# Patient Record
Sex: Male | Born: 1945 | Race: White | Hispanic: No | Marital: Married | Smoking: Current some day smoker
Health system: Southern US, Community
[De-identification: ages and names within clinical notes are randomized; demographics above are authoritative.]

## PROBLEM LIST (undated history)

## (undated) DIAGNOSIS — G2581 Restless legs syndrome: Secondary | ICD-10-CM

## (undated) DIAGNOSIS — I10 Essential (primary) hypertension: Secondary | ICD-10-CM

## (undated) DIAGNOSIS — H3530 Unspecified macular degeneration: Secondary | ICD-10-CM

## (undated) DIAGNOSIS — G959 Disease of spinal cord, unspecified: Secondary | ICD-10-CM

## (undated) DIAGNOSIS — J40 Bronchitis, not specified as acute or chronic: Secondary | ICD-10-CM

## (undated) DIAGNOSIS — K219 Gastro-esophageal reflux disease without esophagitis: Secondary | ICD-10-CM

## (undated) DIAGNOSIS — J449 Chronic obstructive pulmonary disease, unspecified: Secondary | ICD-10-CM

## (undated) DIAGNOSIS — E039 Hypothyroidism, unspecified: Secondary | ICD-10-CM

## (undated) DIAGNOSIS — M1990 Unspecified osteoarthritis, unspecified site: Secondary | ICD-10-CM

## (undated) DIAGNOSIS — Z87442 Personal history of urinary calculi: Secondary | ICD-10-CM

## (undated) HISTORY — PX: OTHER SURGICAL HISTORY: SHX169

## (undated) HISTORY — DX: Disease of spinal cord, unspecified: G95.9

## (undated) HISTORY — PX: NECK SURGERY: SHX720

## (undated) HISTORY — PX: BACK SURGERY: SHX140

---

## 1968-04-15 HISTORY — PX: JOINT REPLACEMENT: SHX530

## 1998-01-04 ENCOUNTER — Ambulatory Visit (HOSPITAL_COMMUNITY): Admission: RE | Admit: 1998-01-04 | Discharge: 1998-01-04 | Payer: Self-pay | Admitting: Family Medicine

## 1998-05-17 ENCOUNTER — Inpatient Hospital Stay (HOSPITAL_COMMUNITY): Admission: RE | Admit: 1998-05-17 | Discharge: 1998-05-18 | Payer: Self-pay | Admitting: Neurosurgery

## 1998-05-17 ENCOUNTER — Encounter: Payer: Self-pay | Admitting: Neurosurgery

## 1998-06-09 ENCOUNTER — Ambulatory Visit (HOSPITAL_COMMUNITY): Admission: RE | Admit: 1998-06-09 | Discharge: 1998-06-09 | Payer: Self-pay | Admitting: Neurosurgery

## 1998-06-09 ENCOUNTER — Encounter: Payer: Self-pay | Admitting: Neurosurgery

## 1998-07-03 ENCOUNTER — Encounter: Payer: Self-pay | Admitting: Neurosurgery

## 1998-07-03 ENCOUNTER — Ambulatory Visit (HOSPITAL_COMMUNITY): Admission: RE | Admit: 1998-07-03 | Discharge: 1998-07-03 | Payer: Self-pay | Admitting: Neurosurgery

## 2000-08-08 ENCOUNTER — Ambulatory Visit (HOSPITAL_COMMUNITY): Admission: RE | Admit: 2000-08-08 | Discharge: 2000-08-08 | Payer: Self-pay | Admitting: Family Medicine

## 2000-08-08 ENCOUNTER — Encounter: Payer: Self-pay | Admitting: Family Medicine

## 2002-07-29 ENCOUNTER — Encounter: Admission: RE | Admit: 2002-07-29 | Discharge: 2002-07-29 | Payer: Self-pay | Admitting: Neurosurgery

## 2002-07-29 ENCOUNTER — Encounter: Payer: Self-pay | Admitting: Neurosurgery

## 2003-10-10 ENCOUNTER — Encounter: Admission: RE | Admit: 2003-10-10 | Discharge: 2003-10-10 | Payer: Self-pay | Admitting: Neurology

## 2003-11-22 ENCOUNTER — Inpatient Hospital Stay (HOSPITAL_COMMUNITY): Admission: RE | Admit: 2003-11-22 | Discharge: 2003-11-24 | Payer: Self-pay | Admitting: Neurosurgery

## 2004-06-28 ENCOUNTER — Inpatient Hospital Stay (HOSPITAL_COMMUNITY): Admission: AD | Admit: 2004-06-28 | Discharge: 2004-07-01 | Payer: Self-pay | Admitting: Internal Medicine

## 2004-11-21 ENCOUNTER — Ambulatory Visit: Payer: Self-pay | Admitting: Orthopedic Surgery

## 2011-10-30 ENCOUNTER — Other Ambulatory Visit: Payer: Self-pay | Admitting: Neurosurgery

## 2011-10-30 DIAGNOSIS — M542 Cervicalgia: Secondary | ICD-10-CM

## 2011-11-01 ENCOUNTER — Ambulatory Visit
Admission: RE | Admit: 2011-11-01 | Discharge: 2011-11-01 | Disposition: A | Discharge status: Home / Self Care | Payer: Medicare Other | Source: Ambulatory Visit | Attending: Neurosurgery | Admitting: Neurosurgery

## 2011-11-01 VITALS — BP 131/55 | HR 68

## 2011-11-01 DIAGNOSIS — M542 Cervicalgia: Secondary | ICD-10-CM

## 2011-11-01 MED ORDER — DIAZEPAM 5 MG PO TABS
10.0000 mg | ORAL_TABLET | Freq: Once | ORAL | Status: AC
  Administered 2011-11-01: 10 mg via ORAL

## 2011-11-01 MED ORDER — IOHEXOL 300 MG/ML  SOLN
10.0000 mL | Freq: Once | INTRAMUSCULAR | Status: AC | PRN
  Administered 2011-11-01: 10 mL via INTRAVENOUS

## 2011-11-25 ENCOUNTER — Other Ambulatory Visit: Payer: Self-pay | Admitting: Neurosurgery

## 2011-11-26 ENCOUNTER — Encounter (HOSPITAL_COMMUNITY): Payer: Self-pay | Admitting: Pharmacy Technician

## 2011-12-03 ENCOUNTER — Encounter (HOSPITAL_COMMUNITY)
Admission: RE | Admit: 2011-12-03 | Discharge: 2011-12-03 | Disposition: A | Discharge status: Home / Self Care | Payer: Medicare Other | Source: Ambulatory Visit | Attending: Neurosurgery | Admitting: Neurosurgery

## 2011-12-03 ENCOUNTER — Encounter (HOSPITAL_COMMUNITY): Payer: Self-pay

## 2011-12-03 HISTORY — DX: Unspecified macular degeneration: H35.30

## 2011-12-03 HISTORY — DX: Gastro-esophageal reflux disease without esophagitis: K21.9

## 2011-12-03 HISTORY — DX: Essential (primary) hypertension: I10

## 2011-12-03 HISTORY — DX: Hypothyroidism, unspecified: E03.9

## 2011-12-03 HISTORY — DX: Unspecified osteoarthritis, unspecified site: M19.90

## 2011-12-03 HISTORY — DX: Bronchitis, not specified as acute or chronic: J40

## 2011-12-03 HISTORY — DX: Restless legs syndrome: G25.81

## 2011-12-03 LAB — BASIC METABOLIC PANEL
CO2: 28 mEq/L (ref 19–32)
Calcium: 9.3 mg/dL (ref 8.4–10.5)
GFR calc Af Amer: >90 mL/min (ref >90)
Sodium: 140 mEq/L (ref 135–145)

## 2011-12-03 LAB — CBC
MCV: 92.2 fL (ref 78.0–100.0)
Platelets: 178 10*3/uL (ref 150–400)
RBC: 4.75 MIL/uL (ref 4.22–5.81)
WBC: 10.9 10*3/uL — ABNORMAL HIGH (ref 4.0–10.5)

## 2011-12-03 LAB — SURGICAL PCR SCREEN: Staphylococcus aureus: NEGATIVE

## 2011-12-03 NOTE — Pre-Procedure Instructions (Signed)
20 JOURDAIN GUAY  12/03/2011   Your procedure is scheduled on:  Thursday December 05, 2011.  Report to [facility] at 0900 AM.  Call this number if you have problems the morning of surgery: [phone]   Remember:   Do not eat food or drink:After Midnight.    Take these medicines the morning of surgery with A SIP OF WATER: Diazepam (Valium), Hydrocodone (Vicodin), and Levothyroxine (Synthroid).   Do not wear jewelry  Do not wear lotions or colognes.  Men may shave face and neck.  Do not bring valuables to the hospital.  Contacts, dentures or bridgework may not be worn into surgery.  Leave suitcase in the car. After surgery it may be brought to your room.  For patients admitted to the hospital, checkout time is 11:00 AM the day of discharge.   Patients discharged the day of surgery will not be allowed to drive home.  Name and phone number of your driver:   Special Instructions: CHG Shower Use Special Wash: 1/2 bottle night before surgery and 1/2 bottle morning of surgery.   Please read over the following fact sheets that you were given: Pain Booklet, Coughing and Deep Breathing, MRSA Information and Surgical Site Infection Prevention

## 2011-12-03 NOTE — Progress Notes (Signed)
CXR from 12/03/11 to Anesthesia for review.

## 2011-12-03 NOTE — Progress Notes (Signed)
Patient informed Nurse that he had a stress test in 2000. Patient denied having a cardiac cath or sleep study.

## 2011-12-04 MED ORDER — DEXTROSE 5 % IV SOLN: 3.0000 g | INTRAVENOUS | Status: DC

## 2011-12-04 MED ORDER — CEFAZOLIN SODIUM 1 G IJ SOLR
3.0000 g | INTRAMUSCULAR | Status: DC
  Filled 2011-12-04: qty 30

## 2011-12-04 MED ORDER — CEFAZOLIN SODIUM 1-5 GM-% IV SOLN: 1.0000 g | Freq: Once | INTRAVENOUS | Status: DC

## 2011-12-04 MED ORDER — DEXAMETHASONE SODIUM PHOSPHATE 10 MG/ML IJ SOLN
10.0000 mg | INTRAMUSCULAR | Status: AC
  Administered 2011-12-05: 10 mg via INTRAVENOUS
  Filled 2011-12-04: qty 1

## 2011-12-04 MED ORDER — DEXTROSE 5 % IV SOLN
3.0000 g | INTRAVENOUS | Status: AC
  Administered 2011-12-05: 3 g via INTRAVENOUS

## 2011-12-04 MED ORDER — DEXAMETHASONE SODIUM PHOSPHATE 10 MG/ML IJ SOLN: 10.0000 mg | INTRAMUSCULAR | Status: DC

## 2011-12-04 NOTE — Consult Note (Signed)
Anesthesia chart review; This is a a 66 year old patient who is scheduled for cervical spine surgery to be performed by Dr. Reinaldo Meeker on 05 December 2011.  CXR dated 17 November 2011- IMPRESSION: Emphysematous and minimal bronchitic changes question COPD. This patient has a history of tobacco use. There is no evidence of an infectious process or other acute abnormality noted.  Lab results dated 03 December 2011- CBC shows WBC very slightly elevated at 10.9 (4.0-10.5) the remainder of the values are within normal limits. CMP shows serum glucose level elevated at 168 (70-99). There does not appear to be a history of diabetes, however, this was a non-fasting study. The remainder of the values are within normal limits.   An EKG was not performed during the patient's pre-anesthesia visit and is scheduled to be performed DOS.   May proceed with surgery as scheduled pending no significant abnormality on the EKG.  Kelton Pillar. Ahtziry Saathoff, PA-C

## 2011-12-05 ENCOUNTER — Encounter (HOSPITAL_COMMUNITY)
Admission: RE | Disposition: A | Discharge status: Home / Self Care | Payer: Self-pay | Source: Ambulatory Visit | Attending: Neurosurgery

## 2011-12-05 ENCOUNTER — Inpatient Hospital Stay (HOSPITAL_COMMUNITY): Payer: Medicare Other | Admitting: Anesthesiology

## 2011-12-05 ENCOUNTER — Encounter (HOSPITAL_COMMUNITY): Payer: Self-pay | Admitting: *Deleted

## 2011-12-05 ENCOUNTER — Inpatient Hospital Stay (HOSPITAL_COMMUNITY)
Admission: RE | Admit: 2011-12-05 | Discharge: 2011-12-07 | DRG: 473 | Disposition: A | Discharge status: Home / Self Care | Payer: Medicare Other | Source: Ambulatory Visit | Attending: Neurosurgery | Admitting: Neurosurgery

## 2011-12-05 ENCOUNTER — Encounter (HOSPITAL_COMMUNITY): Payer: Self-pay | Admitting: Anesthesiology

## 2011-12-05 ENCOUNTER — Inpatient Hospital Stay (HOSPITAL_COMMUNITY): Payer: Medicare Other

## 2011-12-05 DIAGNOSIS — K219 Gastro-esophageal reflux disease without esophagitis: Secondary | ICD-10-CM | POA: Diagnosis present

## 2011-12-05 DIAGNOSIS — G2581 Restless legs syndrome: Secondary | ICD-10-CM | POA: Diagnosis present

## 2011-12-05 DIAGNOSIS — Z01812 Encounter for preprocedural laboratory examination: Secondary | ICD-10-CM

## 2011-12-05 DIAGNOSIS — Z981 Arthrodesis status: Secondary | ICD-10-CM

## 2011-12-05 DIAGNOSIS — J4489 Other specified chronic obstructive pulmonary disease: Secondary | ICD-10-CM | POA: Diagnosis present

## 2011-12-05 DIAGNOSIS — Z79899 Other long term (current) drug therapy: Secondary | ICD-10-CM

## 2011-12-05 DIAGNOSIS — Z87891 Personal history of nicotine dependence: Secondary | ICD-10-CM

## 2011-12-05 DIAGNOSIS — E039 Hypothyroidism, unspecified: Secondary | ICD-10-CM | POA: Diagnosis present

## 2011-12-05 DIAGNOSIS — I10 Essential (primary) hypertension: Secondary | ICD-10-CM | POA: Diagnosis present

## 2011-12-05 DIAGNOSIS — H3530 Unspecified macular degeneration: Secondary | ICD-10-CM | POA: Diagnosis present

## 2011-12-05 DIAGNOSIS — Z96659 Presence of unspecified artificial knee joint: Secondary | ICD-10-CM

## 2011-12-05 DIAGNOSIS — J449 Chronic obstructive pulmonary disease, unspecified: Secondary | ICD-10-CM | POA: Diagnosis present

## 2011-12-05 DIAGNOSIS — Z01818 Encounter for other preprocedural examination: Secondary | ICD-10-CM

## 2011-12-05 DIAGNOSIS — M4712 Other spondylosis with myelopathy, cervical region: Principal | ICD-10-CM | POA: Diagnosis present

## 2011-12-05 HISTORY — PX: POSTERIOR CERVICAL FUSION/FORAMINOTOMY: SHX5038

## 2011-12-05 SURGERY — POSTERIOR CERVICAL FUSION/FORAMINOTOMY LEVEL 4
Anesthesia: General | Site: Spine Cervical | Wound class: Clean

## 2011-12-05 MED ORDER — PROPOFOL 10 MG/ML IV BOLUS
INTRAVENOUS | Status: DC | PRN
  Administered 2011-12-05: 250 mg via INTRAVENOUS
  Administered 2011-12-05: 40 mg via INTRAVENOUS

## 2011-12-05 MED ORDER — MENTHOL 3 MG MT LOZG: 1.0000 | LOZENGE | OROMUCOSAL | Status: DC | PRN

## 2011-12-05 MED ORDER — ACETAMINOPHEN 325 MG PO TABS: 650.0000 mg | ORAL_TABLET | ORAL | Status: DC | PRN

## 2011-12-05 MED ORDER — LACTATED RINGERS IV SOLN
INTRAVENOUS | Status: DC | PRN
  Administered 2011-12-05 (×4): via INTRAVENOUS

## 2011-12-05 MED ORDER — VECURONIUM BROMIDE 10 MG IV SOLR
INTRAVENOUS | Status: DC | PRN
  Administered 2011-12-05: 2 mg via INTRAVENOUS
  Administered 2011-12-05: 4 mg via INTRAVENOUS
  Administered 2011-12-05: 2 mg via INTRAVENOUS
  Administered 2011-12-05: 4 mg via INTRAVENOUS

## 2011-12-05 MED ORDER — HYDROCODONE-ACETAMINOPHEN 5-325 MG PO TABS
1.0000 | ORAL_TABLET | ORAL | Status: DC | PRN
  Administered 2011-12-05 – 2011-12-07 (×6): 2 via ORAL
  Filled 2011-12-05 (×6): qty 2

## 2011-12-05 MED ORDER — ROCURONIUM BROMIDE 100 MG/10ML IV SOLN
INTRAVENOUS | Status: DC | PRN
  Administered 2011-12-05: 50 mg via INTRAVENOUS

## 2011-12-05 MED ORDER — KCL IN DEXTROSE-NACL 20-5-0.45 MEQ/L-%-% IV SOLN
80.0000 mL/h | INTRAVENOUS | Status: DC
  Administered 2011-12-05: 80 mL/h via INTRAVENOUS
  Filled 2011-12-05 (×5): qty 1000

## 2011-12-05 MED ORDER — HYDROMORPHONE HCL PF 1 MG/ML IJ SOLN
INTRAMUSCULAR | Status: AC
  Filled 2011-12-05: qty 1

## 2011-12-05 MED ORDER — DIAZEPAM 5 MG PO TABS
5.0000 mg | ORAL_TABLET | Freq: Four times a day (QID) | ORAL | Status: DC | PRN
  Administered 2011-12-05 – 2011-12-07 (×5): 5 mg via ORAL
  Filled 2011-12-05 (×5): qty 1

## 2011-12-05 MED ORDER — DEXAMETHASONE 4 MG PO TABS
4.0000 mg | ORAL_TABLET | Freq: Four times a day (QID) | ORAL | Status: AC
  Administered 2011-12-05 (×2): 4 mg via ORAL
  Filled 2011-12-05 (×2): qty 1

## 2011-12-05 MED ORDER — HYDROMORPHONE HCL PF 1 MG/ML IJ SOLN: 0.2500 mg | INTRAMUSCULAR | Status: DC | PRN

## 2011-12-05 MED ORDER — HYDROMORPHONE HCL PF 1 MG/ML IJ SOLN
0.2500 mg | INTRAMUSCULAR | Status: DC | PRN
  Administered 2011-12-05 (×4): 0.5 mg via INTRAVENOUS

## 2011-12-05 MED ORDER — LIDOCAINE HCL (CARDIAC) 20 MG/ML IV SOLN
INTRAVENOUS | Status: DC | PRN
  Administered 2011-12-05: 60 mg via INTRAVENOUS

## 2011-12-05 MED ORDER — CEFAZOLIN SODIUM-DEXTROSE 2-3 GM-% IV SOLR
2.0000 g | Freq: Three times a day (TID) | INTRAVENOUS | Status: AC
  Administered 2011-12-05: 2 g via INTRAVENOUS
  Filled 2011-12-05 (×2): qty 50

## 2011-12-05 MED ORDER — ONDANSETRON HCL 4 MG/2ML IJ SOLN: 4.0000 mg | Freq: Once | INTRAMUSCULAR | Status: DC | PRN

## 2011-12-05 MED ORDER — BENAZEPRIL HCL 10 MG PO TABS
10.0000 mg | ORAL_TABLET | Freq: Every day | ORAL | Status: DC
  Administered 2011-12-05 – 2011-12-06 (×2): 10 mg via ORAL
  Filled 2011-12-05 (×3): qty 1

## 2011-12-05 MED ORDER — THROMBIN 5000 UNITS EX KIT
PACK | CUTANEOUS | Status: DC | PRN
  Administered 2011-12-05 (×4): 5000 [IU] via TOPICAL

## 2011-12-05 MED ORDER — SODIUM CHLORIDE 0.9 % IJ SOLN
3.0000 mL | Freq: Two times a day (BID) | INTRAMUSCULAR | Status: DC
  Administered 2011-12-06 – 2011-12-07 (×3): 3 mL via INTRAVENOUS

## 2011-12-05 MED ORDER — DEXAMETHASONE SODIUM PHOSPHATE 4 MG/ML IJ SOLN: 4.0000 mg | Freq: Four times a day (QID) | INTRAMUSCULAR | Status: AC

## 2011-12-05 MED ORDER — THROMBIN 5000 UNITS EX SOLR: CUTANEOUS | Status: DC | PRN

## 2011-12-05 MED ORDER — NEOSTIGMINE METHYLSULFATE 1 MG/ML IJ SOLN
INTRAMUSCULAR | Status: DC | PRN
  Administered 2011-12-05: 5 mg via INTRAVENOUS

## 2011-12-05 MED ORDER — 0.9 % SODIUM CHLORIDE (POUR BTL) OPTIME
TOPICAL | Status: DC | PRN
  Administered 2011-12-05: 1000 mL

## 2011-12-05 MED ORDER — STERILE WATER FOR IRRIGATION IR SOLN
Status: DC | PRN
  Administered 2011-12-05: 1000 mL

## 2011-12-05 MED ORDER — BUPIVACAINE HCL (PF) 0.25 % IJ SOLN
INTRAMUSCULAR | Status: DC | PRN
  Administered 2011-12-05: 10 mL

## 2011-12-05 MED ORDER — HEMOSTATIC AGENTS (NO CHARGE) OPTIME
TOPICAL | Status: DC | PRN
  Administered 2011-12-05 (×2): 1 via TOPICAL

## 2011-12-05 MED ORDER — GLYCOPYRROLATE 0.2 MG/ML IJ SOLN
INTRAMUSCULAR | Status: DC | PRN
  Administered 2011-12-05: .6 mg via INTRAVENOUS

## 2011-12-05 MED ORDER — SODIUM CHLORIDE 0.9 % IV SOLN
INTRAVENOUS | Status: AC
  Filled 2011-12-05: qty 500

## 2011-12-05 MED ORDER — ONDANSETRON HCL 4 MG/2ML IJ SOLN
4.0000 mg | INTRAMUSCULAR | Status: DC | PRN
  Administered 2011-12-05: 4 mg via INTRAVENOUS
  Filled 2011-12-05: qty 2

## 2011-12-05 MED ORDER — LEVOTHYROXINE SODIUM 175 MCG PO TABS
175.0000 ug | ORAL_TABLET | Freq: Every day | ORAL | Status: DC
  Administered 2011-12-06 – 2011-12-07 (×2): 175 ug via ORAL
  Filled 2011-12-05 (×3): qty 1

## 2011-12-05 MED ORDER — HYDROMORPHONE HCL PF 1 MG/ML IJ SOLN
1.0000 mg | INTRAMUSCULAR | Status: DC | PRN
  Administered 2011-12-05 – 2011-12-06 (×4): 1 mg via INTRAMUSCULAR
  Filled 2011-12-05 (×3): qty 1
  Filled 2011-12-05: qty 2

## 2011-12-05 MED ORDER — SUFENTANIL CITRATE 50 MCG/ML IV SOLN
INTRAVENOUS | Status: DC | PRN
  Administered 2011-12-05 (×2): 10 ug via INTRAVENOUS
  Administered 2011-12-05: 20 ug via INTRAVENOUS
  Administered 2011-12-05 (×3): 10 ug via INTRAVENOUS
  Administered 2011-12-05: 20 ug via INTRAVENOUS

## 2011-12-05 MED ORDER — BACITRACIN 50000 UNITS IM SOLR
INTRAMUSCULAR | Status: AC
  Filled 2011-12-05: qty 1

## 2011-12-05 MED ORDER — SODIUM CHLORIDE 0.9 % IJ SOLN: 3.0000 mL | INTRAMUSCULAR | Status: DC | PRN

## 2011-12-05 MED ORDER — ACETAMINOPHEN 650 MG RE SUPP: 650.0000 mg | RECTAL | Status: DC | PRN

## 2011-12-05 MED ORDER — SODIUM CHLORIDE 0.9 % IV SOLN: 250.0000 mL | INTRAVENOUS | Status: DC

## 2011-12-05 MED ORDER — PHENOL 1.4 % MT LIQD: 1.0000 | OROMUCOSAL | Status: DC | PRN

## 2011-12-05 SURGICAL SUPPLY — 62 items
3.5x14mm Nex link screw ×16 IMPLANT
ADH SKN CLS LQ APL DERMABOND (GAUZE/BANDAGES/DRESSINGS) ×2
APL SKNCLS STERI-STRIP NONHPOA (GAUZE/BANDAGES/DRESSINGS) ×3
BAG DECANTER FOR FLEXI CONT (MISCELLANEOUS) ×2 IMPLANT
BENZOIN TINCTURE PRP APPL 2/3 (GAUZE/BANDAGES/DRESSINGS) ×6 IMPLANT
BLADE SURG ROTATE 9660 (MISCELLANEOUS) ×2 IMPLANT
BNDG ADH 5X4 AIR PERM ELC (GAUZE/BANDAGES/DRESSINGS) ×1
BNDG COHESIVE 4X5 WHT NS (GAUZE/BANDAGES/DRESSINGS) ×2 IMPLANT
BONE EQUIVA 10CC (Bone Implant) ×2 IMPLANT
BRUSH SCRUB EZ PLAIN DRY (MISCELLANEOUS) ×2 IMPLANT
CANISTER SUCTION 2500CC (MISCELLANEOUS) ×2 IMPLANT
CLOTH BEACON ORANGE TIMEOUT ST (SAFETY) ×2 IMPLANT
CONT SPEC 4OZ CLIKSEAL STRL BL (MISCELLANEOUS) ×4 IMPLANT
DERMABOND ADHESIVE PROPEN (GAUZE/BANDAGES/DRESSINGS) ×2
DERMABOND ADVANCED .7 DNX6 (GAUZE/BANDAGES/DRESSINGS) ×2 IMPLANT
DRAPE C-ARM 42X72 X-RAY (DRAPES) ×4 IMPLANT
DRAPE LAPAROTOMY 100X72 PEDS (DRAPES) ×2 IMPLANT
DRAPE SURG 17X23 STRL (DRAPES) ×4 IMPLANT
DRESSING TELFA 8X3 (GAUZE/BANDAGES/DRESSINGS) ×2 IMPLANT
ELECT BLADE 4.0 EZ CLEAN MEGAD (MISCELLANEOUS) ×2
ELECT REM PT RETURN 9FT ADLT (ELECTROSURGICAL) ×2
ELECTRODE BLDE 4.0 EZ CLN MEGD (MISCELLANEOUS) ×1 IMPLANT
ELECTRODE REM PT RTRN 9FT ADLT (ELECTROSURGICAL) ×1 IMPLANT
EVACUATOR 1/8 PVC DRAIN (DRAIN) ×2 IMPLANT
GAUZE SPONGE 4X4 16PLY XRAY LF (GAUZE/BANDAGES/DRESSINGS) ×2 IMPLANT
GLOVE BIOGEL PI IND STRL 7.0 (GLOVE) ×3 IMPLANT
GLOVE BIOGEL PI IND STRL 8 (GLOVE) ×1 IMPLANT
GLOVE BIOGEL PI INDICATOR 7.0 (GLOVE) ×3
GLOVE BIOGEL PI INDICATOR 8 (GLOVE) ×1
GLOVE ECLIPSE 7.5 STRL STRAW (GLOVE) ×4 IMPLANT
GLOVE EXAM NITRILE LRG STRL (GLOVE) ×4 IMPLANT
GLOVE EXAM NITRILE XL STR (GLOVE) IMPLANT
GLOVE EXAM NITRILE XS STR PU (GLOVE) IMPLANT
GLOVE SS BIOGEL STRL SZ 6.5 (GLOVE) ×2 IMPLANT
GLOVE SUPERSENSE BIOGEL SZ 6.5 (GLOVE) ×2
GLOVE SURG SS PI 7.0 STRL IVOR (GLOVE) ×2 IMPLANT
GOWN BRE IMP SLV AUR LG STRL (GOWN DISPOSABLE) ×4 IMPLANT
GOWN BRE IMP SLV AUR XL STRL (GOWN DISPOSABLE) ×4 IMPLANT
GOWN STRL REIN 2XL LVL4 (GOWN DISPOSABLE) ×2 IMPLANT
KIT BASIN OR (CUSTOM PROCEDURE TRAY) ×2 IMPLANT
KIT ROOM TURNOVER OR (KITS) ×2 IMPLANT
MARKER SKIN DUAL TIP RULER LAB (MISCELLANEOUS) ×2 IMPLANT
NEEDLE HYPO 22GX1.5 SAFETY (NEEDLE) ×2 IMPLANT
NEEDLE SPNL 20GX3.5 QUINCKE YW (NEEDLE) ×2 IMPLANT
NS IRRIG 1000ML POUR BTL (IV SOLUTION) ×2 IMPLANT
PACK LAMINECTOMY NEURO (CUSTOM PROCEDURE TRAY) ×2 IMPLANT
PAD ARMBOARD 7.5X6 YLW CONV (MISCELLANEOUS) ×2 IMPLANT
PAD EYE OVAL STERILE LF (GAUZE/BANDAGES/DRESSINGS) ×4 IMPLANT
ROD TITANIUM 4MM 120MM (Rod) ×4 IMPLANT
RUBBERBAND STERILE (MISCELLANEOUS) ×4 IMPLANT
SPONGE GAUZE 4X4 12PLY (GAUZE/BANDAGES/DRESSINGS) ×2 IMPLANT
SPONGE SURGIFOAM ABS GEL SZ50 (HEMOSTASIS) ×4 IMPLANT
STAPLER SKIN PROX WIDE 3.9 (STAPLE) ×4 IMPLANT
STRIP CLOSURE SKIN 1/2X4 (GAUZE/BANDAGES/DRESSINGS) ×4 IMPLANT
SUT VIC AB 2-0 OS6 18 (SUTURE) ×8 IMPLANT
SUT VIC AB 3-0 CP2 18 (SUTURE) ×2 IMPLANT
SYR 20ML ECCENTRIC (SYRINGE) ×2 IMPLANT
TOOL MATCHSTK 3MM (MISCELLANEOUS) ×2 IMPLANT
TOWEL OR 17X24 6PK STRL BLUE (TOWEL DISPOSABLE) ×2 IMPLANT
TOWEL OR 17X26 10 PK STRL BLUE (TOWEL DISPOSABLE) ×2 IMPLANT
WATER STERILE IRR 1000ML POUR (IV SOLUTION) ×2 IMPLANT
closure cap ×16 IMPLANT

## 2011-12-05 NOTE — Op Note (Signed)
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 Linear incision was made over the spinous processes of the cervical spine and carried down to them. Subperiosteal dissection was then carried out bilaterally on the spinous processes lamina and facet joint and lateral masses. Subcutaneous tract was placed for exposure and fluoroscopy showed approach the appropriate level. Spinous process of C4 C5-C6 and C7 were then removed. Generous hemilaminotomy was then performed on the left side removing the entire lamina of C4 C5-C6 and C7 and inferior one half of the C3 lamina. Decompression was then carried out towards the right side until the spinal cord was well decompressed from all the lip to the top of T1. The cord was found to be pulsatile and well decompressed. We then place lateral mass screws at C4 C5-C6 and C7. We used small drill hole entry points and then placed a small drill hole from an inferior to superior direction and a meal to lateral direction. We tapped with a small tap and then placed 14 mm screws at C4 C5-C6 and C7 bilaterally. We then fashioned an appropriate length rod and secured to the top of the screw heads without difficulty. We then did tightening and final tightening with torque and counter torque of the screw heads bilaterally. We then decorticated the far lateral region and placed a mixture of morselized allograft and autologous bone for posterior fusion. We then irrigated copiously controlled any bleeding with upper coagulation Gelfoam on the dura. Left an epidural drain in the epidural space and brought out  through a separate stab incision. We then closed the incision in multiple layers of Vicryl on the muscle fascia subcutaneous and subcuticular tissues. We placed Dermabond and Steri-Strips on the skin. A sterile dressing was then applied and the patient was extubated and taken to recovery in stable condition.

## 2011-12-05 NOTE — H&P (Signed)
[name] is an 66 y.o. male.   Chief Complaint: Neck and bilateral shoulder and arm pain HPI: The patient is 66 year old gentleman who had an anterior cervical discectomy at C3-4 number of years ago. He did well after surgery but over the last number of months has developed neck pain with radiation towards the shoulders bilaterally. He's been tried on conservative therapy without improvement and underwent imaging studies which showed spondylosis and stenosis at multiple levels in the cervical spine with foraminal encroachment. After failing conservative therapy and discussing the options the patient requested surgery now comes for multilevel cervical decompression with lateral mass screws. I've had a long discussion with him regarding the risks and benefits of surgical intervention. Risks discussed include but are not limited to bleeding infection weakness numbness paralysis spinal fluid leak trouble with instrumentation nonunion coma and death. We have discussed alternative methods of therapy offered risks and benefits of nonintervention. He has had the opportunity to ask numerous questions and appears to understand. With this information in hand he has requested that we proceed with surgery.  Past Medical History  Diagnosis Date  . Macular degeneration     right eye  . Restless leg syndrome   . Hypothyroidism   . Hypertension   . Bronchitis     hx of  . GERD (gastroesophageal reflux disease)   . Arthritis     Past Surgical History  Procedure Date  . Joint replacement 1970    Right knee   . Graves disease   . Back surgery     Fusion 2005  . Neck surgery     History reviewed. No pertinent family history. Social History:  reports that he quit smoking 6 days ago. His smoking use included Cigarettes. He quit after 50 years of use. He does not have any smokeless tobacco history on file. He reports that he does not drink alcohol or use illicit drugs.  Allergies:  Allergies    Allergen Reactions  . Sulfa Antibiotics Other (See Comments)    Causes blisters and skin to peel from inside mouth    Medications Prior to Admission  Medication Sig Dispense Refill  . benazepril (LOTENSIN) 20 MG tablet Take 10 mg by mouth at bedtime.      . Carboxymethylcellul-Glycerin (OPTIVE) 0.5-0.9 % SOLN Place 1-2 drops into both eyes daily.      . diazepam (VALIUM) 10 MG tablet Take 10 mg by mouth 4 (four) times daily.      Marland Kitchen HYDROcodone-acetaminophen (VICODIN) 5-500 MG per tablet Take 2 tablets by mouth 3 (three) times daily.      Marland Kitchen levothyroxine (SYNTHROID, LEVOTHROID) 175 MCG tablet Take 175 mcg by mouth daily.      . Multiple Vitamins-Minerals (PRESERVISION AREDS 2 PO) Take 1 tablet by mouth 2 (two) times daily.      Marland Kitchen OVER THE COUNTER MEDICATION Take 1 tablet by mouth 2 (two) times daily. Over the counter prostate formula        Results for orders placed during the hospital encounter of 12/03/11 (from the past 48 hour(s))  BASIC METABOLIC PANEL     Status: Abnormal   Collection Time   12/03/11  3:34 PM      Component Value Range Comment   Sodium 140  135 - 145 mEq/L    Potassium 4.3  3.5 - 5.1 mEq/L HEMOLYSIS AT THIS LEVEL MAY AFFECT RESULT   Chloride 102  96 - 112 mEq/L    CO2 28  19 -  32 mEq/L    Glucose, Bld 168 (*) 70 - 99 mg/dL    BUN 14  6 - 23 mg/dL    Creatinine, Ser 2.13  0.50 - 1.35 mg/dL    Calcium 9.3  8.4 - 08.6 mg/dL    GFR calc non Af Amer >90  >90 mL/min    GFR calc Af Amer >90  >90 mL/min   CBC     Status: Abnormal   Collection Time   12/03/11  3:34 PM      Component Value Range Comment   WBC 10.9 (*) 4.0 - 10.5 K/uL    RBC 4.75  4.22 - 5.81 MIL/uL    Hemoglobin 15.0  13.0 - 17.0 g/dL    HCT 57.8  46.9 - 62.9 %    MCV 92.2  78.0 - 100.0 fL    MCH 31.6  26.0 - 34.0 pg    MCHC 34.2  30.0 - 36.0 g/dL    RDW 52.8  41.3 - 24.4 %    Platelets 178  150 - 400 K/uL   SURGICAL PCR SCREEN     Status: Normal   Collection Time   12/03/11  3:35 PM       Component Value Range Comment   MRSA, PCR NEGATIVE  NEGATIVE    Staphylococcus aureus NEGATIVE  NEGATIVE    Dg Chest 2 View  12/03/2011  *RADIOLOGY REPORT*  Clinical Data: Preoperative assessment for cervical spine fusion, hypertension, smoker  CHEST - 2 VIEW  Comparison: 11/17/2003  Findings: Normal heart size, mediastinal contours, and pulmonary vascularity. Emphysematous changes and minimal peribronchial thickening question COPD. No acute infiltrate, pleural effusion or pneumothorax. Right costophrenic angle excluded. Endplate spur formation lower thoracic spine.  IMPRESSION: Emphysematous and minimal bronchitic changes question COPD.   Original Report Authenticated By: Lollie Marrow, M.D.     A comprehensive review of systems was negative.  Blood pressure 157/94, pulse 82, temperature 98.2 F (36.8 C), temperature source Oral, resp. rate 20, SpO2 95.00%.  The patient is awake or and oriented. His no facial asymmetry. His gait is nonantalgic. Reflexes are decreased but equal. His strength is mildly decreased proximally in the upper extremities. 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 The plan is for a multilevel cervical laminectomy with foraminotomies and lateral mass fixation.  Reinaldo Meeker, MD 12/05/2011, 11:31 AM

## 2011-12-05 NOTE — Anesthesia Postprocedure Evaluation (Signed)
  Anesthesia Post-op Note  Patient: [name]  Procedure(s) Performed: Procedure(s) (LRB): POSTERIOR CERVICAL FUSION/FORAMINOTOMY LEVEL 4 (N/A)  Patient Location: PACU  Anesthesia Type: General  Level of Consciousness: awake, oriented, sedated and patient cooperative  Airway and Oxygen Therapy: Patient Spontanous Breathing and Patient connected to nasal cannula oxygen  Post-op Pain: mild  Post-op Assessment: Post-op Vital signs reviewed, Patient's Cardiovascular Status Stable, Respiratory Function Stable, Patent Airway, No signs of Nausea or vomiting and Pain level controlled  Post-op Vital Signs: stable  Complications: No apparent anesthesia complications

## 2011-12-05 NOTE — Anesthesia Preprocedure Evaluation (Addendum)
Anesthesia Evaluation  Patient identified by MRN, date of birth, ID band Patient awake    Reviewed: Allergy & Precautions, H&P , NPO status , Patient's Chart, lab work & pertinent test results  Airway Mallampati: II TM Distance: >3 FB Neck ROM: full    Dental  (+) Edentulous Upper   Pulmonary COPD         Cardiovascular hypertension, Pt. on medications Rhythm:regular Rate:Normal     Neuro/Psych  Neuromuscular disease    GI/Hepatic GERD-  Medicated,  Endo/Other  Hypothyroidism   Renal/GU      Musculoskeletal   Abdominal   Peds  Hematology   Anesthesia Other Findings   Reproductive/Obstetrics                        Anesthesia Physical Anesthesia Plan  ASA: III  Anesthesia Plan: General   Post-op Pain Management:    Induction: Intravenous  Airway Management Planned: Oral ETT  Additional Equipment:   Intra-op Plan:   Post-operative Plan: Extubation in OR  Informed Consent: I have reviewed the patients History and Physical, chart, labs and discussed the procedure including the risks, benefits and alternatives for the proposed anesthesia with the patient or authorized representative who has indicated his/her understanding and acceptance.   Dental advisory given  Plan Discussed with: CRNA, Anesthesiologist and Surgeon  Anesthesia Plan Comments:       Anesthesia Quick Evaluation

## 2011-12-05 NOTE — Progress Notes (Signed)
Pt came from PACU, alert and orient, moves all extremities. But having pain in both arms after the surgery, right hand more. He has good range of motion in both hands, sensation same, no numbness and has good grip. Doctor is aware about the pain, things it would be from the positioning in the OR table. Will keep close monitoring, no other concerns.

## 2011-12-05 NOTE — Transfer of Care (Signed)
Immediate Anesthesia Transfer of Care Note  Patient: [name]  Procedure(s) Performed: Procedure(s) (LRB): POSTERIOR CERVICAL FUSION/FORAMINOTOMY LEVEL 4 (N/A)  Patient Location: PACU  Anesthesia Type: General  Level of Consciousness: awake and alert   Airway & Oxygen Therapy: Patient Spontanous Breathing and Patient connected to face mask oxygen  Post-op Assessment: Report given to PACU RN and Post -op Vital signs reviewed and stable  Post vital signs: Reviewed and stable  Complications: No apparent anesthesia complications

## 2011-12-05 NOTE — Preoperative (Signed)
Beta Blockers   Reason not to administer Beta Blockers:Not Applicable 

## 2011-12-06 ENCOUNTER — Encounter (HOSPITAL_COMMUNITY): Payer: Self-pay | Admitting: Neurosurgery

## 2011-12-06 NOTE — Progress Notes (Signed)
Patient ID: [name], male   DOB: 05/14/1945, 66 y.o.   MRN: [id number] Subjective: Patient reports feeling none of the old pain  Objective: Vital signs in last 24 hours: Temp:  [97 F (36.1 C)-97.5 F (36.4 C)] 97.3 F (36.3 C) (08/23 1000) Pulse Rate:  [64-104] 104  (08/23 1000) Resp:  [7-20] 18  (08/23 1000) BP: (145-184)/(69-92) 145/80 mmHg (08/23 1000) SpO2:  [94 %-100 %] 94 % (08/23 1000)  Intake/Output from previous day: 08/22 0701 - 08/23 0700 In: 4090.7 [I.V.:4090.7] Out: 4070 [Urine:2700; Drains:670; Blood:700] Intake/Output this shift:    strength good. some dysesthetic pain right forearm.  Lab Results:  [facility] 12/03/11 1534  WBC 10.9*  HGB 15.0  HCT 43.8  PLT 178   BMET  Basename 12/03/11 1534  NA 140  K 4.3  CL 102  CO2 28  GLUCOSE 168*  BUN 14  CREATININE 0.84  CALCIUM 9.3    Studies/Results: Dg Cervical Spine 1 View  12/05/2011  *RADIOLOGY REPORT*  Clinical Data: C4-C7 posterior cervical fusion.  CERVICAL SPINE - 1 VIEW  Comparison: CT dated 11/01/2011.  Findings: Three lateral C-arm views of the cervical spine demonstrate interval posterior fixation hardware beginning at the C4 level and extending inferiorly.  The inferior portion of the hardware cannot be visualized due to overlapping of the patient's shoulders.  Normal alignment to the C5 level is visualized.  Large anterior spurs are again demonstrated at the C2-3 and C4-5 levels.  IMPRESSION: Operative changes, as described above.   Original Report Authenticated By: Darrol Angel, M.D.    Dg C-arm 1-60 Min  12/05/2011  *RADIOLOGY REPORT*  Clinical Data: C4-C7 posterior cervical fusion.  CERVICAL SPINE - 1 VIEW  Comparison: CT dated 11/01/2011.  Findings: Three lateral C-arm views of the cervical spine demonstrate interval posterior fixation hardware beginning at the C4 level and extending inferiorly.  The inferior portion of the hardware cannot be visualized due to overlapping of the  patient's shoulders.  Normal alignment to the C5 level is visualized.  Large anterior spurs are again demonstrated at the C2-3 and C4-5 levels.  IMPRESSION: Operative changes, as described above.   Original Report Authenticated By: Darrol Angel, M.D.     Assessment/Plan: Doing well POD 1. Will increase activity today, probable d/c tomorrow.  LOS: 1 day  as above   Reinaldo Meeker, MD 12/06/2011, 1:48 PM

## 2011-12-06 NOTE — Progress Notes (Signed)
UR COMPLETED  

## 2011-12-07 MED ORDER — DIAZEPAM 5 MG PO TABS: 5.0000 mg | ORAL_TABLET | Freq: Four times a day (QID) | ORAL | Status: AC | PRN

## 2011-12-07 MED ORDER — HYDROCODONE-ACETAMINOPHEN 5-325 MG PO TABS: 1.0000 | ORAL_TABLET | ORAL | Status: AC | PRN

## 2011-12-07 NOTE — Discharge Summary (Signed)
  Physician Discharge Summary  Patient ID: [name] MRN: [id number] DOB/AGE: 1945/08/05 66 y.o.  Admit date: 12/05/2011 Discharge date: 12/07/2011  Admission Diagnoses: Cervical stenosis with myelopathy  Discharge Diagnoses: Same Active Problems:  * No active hospital problems. *    Discharged Condition: good  Hospital Course: Patient is admitted hospital underwent and a posterior cervical decompression and fusion posterior patient did very well went to the floor on the floor he was convalescing well was angling and voiding spontaneously and tolerating her diet was he'll be discharged home scheduled followup proximal one 2 weeks hematocrit appeared  Consults: Significant Diagnostic Studies: Treatments: Posterior cervical decompression and fusion Discharge Exam: Blood pressure 120/64, pulse 70, temperature 97.5 F (36.4 C), temperature source Oral, resp. rate 18, height 6\' 7"  (2.007 m), weight 138 kg (304 lb 3.8 oz), SpO2 97.00%. Neurologically stable  Disposition: Home   Medication List  As of 12/07/2011  8:20 AM   TAKE these medications         benazepril 20 MG tablet   Commonly known as: LOTENSIN   Take 10 mg by mouth at bedtime.      diazepam 5 MG tablet   Commonly known as: VALIUM   Take 1 tablet (5 mg total) by mouth every 6 (six) hours as needed.      diazepam 10 MG tablet   Commonly known as: VALIUM   Take 10 mg by mouth 4 (four) times daily.      HYDROcodone-acetaminophen 5-325 MG per tablet   Commonly known as: NORCO/VICODIN   Take 1-2 tablets by mouth every 4 (four) hours as needed.      HYDROcodone-acetaminophen 5-500 MG per tablet   Commonly known as: VICODIN   Take 2 tablets by mouth 3 (three) times daily.      levothyroxine 175 MCG tablet   Commonly known as: SYNTHROID, LEVOTHROID   Take 175 mcg by mouth daily.      OPTIVE 0.5-0.9 % Soln   Generic drug: Carboxymethylcellul-Glycerin   Place 1-2 drops into both eyes daily.      OVER THE  COUNTER MEDICATION   Take 1 tablet by mouth 2 (two) times daily. Over the counter prostate formula      PRESERVISION AREDS 2 PO   Take 1 tablet by mouth 2 (two) times daily.             Signed: Orvilla Truett P 12/07/2011, 8:21 AM

## 2011-12-07 NOTE — Progress Notes (Signed)
Patient did walk in the hall way early morning. Tolerated very well.

## 2011-12-07 NOTE — Progress Notes (Signed)
Patient ID: [name], male   DOB: 1945/04/28, 66 y.o.   MRN: [id number] [name] is feeling much better his wound is clean and dry to cut his drain strength appears to be stable at 4+ to 5 out of 5 in both upper extremities. He is to maintain a cervical collar will discharge him home.

## 2011-12-11 ENCOUNTER — Encounter (HOSPITAL_COMMUNITY): Payer: Self-pay

## 2019-01-30 ENCOUNTER — Observation Stay (HOSPITAL_COMMUNITY)
Admission: AD | Admit: 2019-01-30 | Discharge: 2019-01-31 | Discharge status: Against Medical Advice | Payer: Medicare Other | Source: Other Acute Inpatient Hospital | Attending: Internal Medicine | Admitting: Internal Medicine

## 2019-01-30 DIAGNOSIS — G2581 Restless legs syndrome: Secondary | ICD-10-CM | POA: Insufficient documentation

## 2019-01-30 DIAGNOSIS — J441 Chronic obstructive pulmonary disease with (acute) exacerbation: Secondary | ICD-10-CM | POA: Insufficient documentation

## 2019-01-30 DIAGNOSIS — Z8249 Family history of ischemic heart disease and other diseases of the circulatory system: Secondary | ICD-10-CM | POA: Diagnosis not present

## 2019-01-30 DIAGNOSIS — Z79890 Hormone replacement therapy (postmenopausal): Secondary | ICD-10-CM | POA: Insufficient documentation

## 2019-01-30 DIAGNOSIS — E7800 Pure hypercholesterolemia, unspecified: Secondary | ICD-10-CM | POA: Insufficient documentation

## 2019-01-30 DIAGNOSIS — R0902 Hypoxemia: Secondary | ICD-10-CM

## 2019-01-30 DIAGNOSIS — I119 Hypertensive heart disease without heart failure: Secondary | ICD-10-CM | POA: Insufficient documentation

## 2019-01-30 DIAGNOSIS — E039 Hypothyroidism, unspecified: Secondary | ICD-10-CM | POA: Insufficient documentation

## 2019-01-30 DIAGNOSIS — J9601 Acute respiratory failure with hypoxia: Secondary | ICD-10-CM | POA: Diagnosis present

## 2019-01-30 DIAGNOSIS — M1990 Unspecified osteoarthritis, unspecified site: Secondary | ICD-10-CM | POA: Insufficient documentation

## 2019-01-30 DIAGNOSIS — I10 Essential (primary) hypertension: Secondary | ICD-10-CM | POA: Diagnosis present

## 2019-01-30 DIAGNOSIS — Z882 Allergy status to sulfonamides status: Secondary | ICD-10-CM | POA: Insufficient documentation

## 2019-01-30 DIAGNOSIS — R7989 Other specified abnormal findings of blood chemistry: Secondary | ICD-10-CM | POA: Insufficient documentation

## 2019-01-30 DIAGNOSIS — Z20828 Contact with and (suspected) exposure to other viral communicable diseases: Secondary | ICD-10-CM | POA: Insufficient documentation

## 2019-01-30 DIAGNOSIS — Z79899 Other long term (current) drug therapy: Secondary | ICD-10-CM | POA: Diagnosis not present

## 2019-01-30 DIAGNOSIS — K219 Gastro-esophageal reflux disease without esophagitis: Secondary | ICD-10-CM | POA: Insufficient documentation

## 2019-01-30 DIAGNOSIS — R079 Chest pain, unspecified: Principal | ICD-10-CM | POA: Diagnosis present

## 2019-01-30 DIAGNOSIS — Z87891 Personal history of nicotine dependence: Secondary | ICD-10-CM | POA: Diagnosis not present

## 2019-01-30 DIAGNOSIS — D72829 Elevated white blood cell count, unspecified: Secondary | ICD-10-CM | POA: Insufficient documentation

## 2019-01-30 DIAGNOSIS — Z881 Allergy status to other antibiotic agents status: Secondary | ICD-10-CM | POA: Insufficient documentation

## 2019-01-30 DIAGNOSIS — J449 Chronic obstructive pulmonary disease, unspecified: Secondary | ICD-10-CM | POA: Diagnosis present

## 2019-01-30 NOTE — Progress Notes (Signed)
Notify admidsion md, pt arrived in room, orieted to floor and room,  disscussed with pt rest in bed, will continue monitor

## 2019-01-31 ENCOUNTER — Other Ambulatory Visit: Payer: Self-pay

## 2019-01-31 ENCOUNTER — Encounter (HOSPITAL_COMMUNITY): Payer: Self-pay

## 2019-01-31 ENCOUNTER — Observation Stay (HOSPITAL_BASED_OUTPATIENT_CLINIC_OR_DEPARTMENT_OTHER): Payer: Medicare Other

## 2019-01-31 ENCOUNTER — Observation Stay (HOSPITAL_COMMUNITY): Payer: Medicare Other

## 2019-01-31 DIAGNOSIS — R079 Chest pain, unspecified: Secondary | ICD-10-CM

## 2019-01-31 DIAGNOSIS — Z20828 Contact with and (suspected) exposure to other viral communicable diseases: Secondary | ICD-10-CM | POA: Diagnosis not present

## 2019-01-31 DIAGNOSIS — I10 Essential (primary) hypertension: Secondary | ICD-10-CM | POA: Diagnosis present

## 2019-01-31 DIAGNOSIS — J441 Chronic obstructive pulmonary disease with (acute) exacerbation: Secondary | ICD-10-CM | POA: Diagnosis not present

## 2019-01-31 DIAGNOSIS — J9601 Acute respiratory failure with hypoxia: Secondary | ICD-10-CM | POA: Diagnosis present

## 2019-01-31 DIAGNOSIS — J9602 Acute respiratory failure with hypercapnia: Secondary | ICD-10-CM

## 2019-01-31 DIAGNOSIS — J449 Chronic obstructive pulmonary disease, unspecified: Secondary | ICD-10-CM | POA: Diagnosis present

## 2019-01-31 DIAGNOSIS — E7800 Pure hypercholesterolemia, unspecified: Secondary | ICD-10-CM

## 2019-01-31 DIAGNOSIS — R778 Other specified abnormalities of plasma proteins: Secondary | ICD-10-CM

## 2019-01-31 LAB — CBC
HCT: 45.1 % (ref 39.0–52.0)
Hemoglobin: 15.3 g/dL (ref 13.0–17.0)
MCH: 31.8 pg (ref 26.0–34.0)
MCHC: 33.9 g/dL (ref 30.0–36.0)
MCV: 93.8 fL (ref 80.0–100.0)
Platelets: 236 10*3/uL (ref 150–400)
RBC: 4.81 MIL/uL (ref 4.22–5.81)
RDW: 14 % (ref 11.5–15.5)
WBC: 25.1 10*3/uL — ABNORMAL HIGH (ref 4.0–10.5)
nRBC: 0 % (ref 0.0–0.2)

## 2019-01-31 LAB — HEPARIN LEVEL (UNFRACTIONATED): Heparin Unfractionated: 0.57 IU/mL (ref 0.30–0.70)

## 2019-01-31 LAB — SARS CORONAVIRUS 2 BY RT PCR (HOSPITAL ORDER, PERFORMED IN ~~LOC~~ HOSPITAL LAB): SARS Coronavirus 2: NEGATIVE

## 2019-01-31 LAB — LIPID PANEL
Cholesterol: 192 mg/dL (ref 0–200)
HDL: 35 mg/dL — ABNORMAL LOW (ref >40)
LDL Cholesterol: 136 mg/dL — ABNORMAL HIGH (ref 0–99)
Total CHOL/HDL Ratio: 5.5 RATIO
Triglycerides: 106 mg/dL (ref <150)
VLDL: 21 mg/dL (ref 0–40)

## 2019-01-31 LAB — BASIC METABOLIC PANEL
Anion gap: 13 (ref 5–15)
BUN: 21 mg/dL (ref 8–23)
CO2: 26 mmol/L (ref 22–32)
Calcium: 9.7 mg/dL (ref 8.9–10.3)
Chloride: 101 mmol/L (ref 98–111)
Creatinine, Ser: 0.97 mg/dL (ref 0.61–1.24)
GFR calc Af Amer: >60 mL/min (ref >60)
GFR calc non Af Amer: >60 mL/min (ref >60)
Glucose, Bld: 121 mg/dL — ABNORMAL HIGH (ref 70–99)
Potassium: 4.2 mmol/L (ref 3.5–5.1)
Sodium: 140 mmol/L (ref 135–145)

## 2019-01-31 LAB — ECHOCARDIOGRAM COMPLETE
Height: 78.74 in
Weight: 4489.27 oz

## 2019-01-31 LAB — FERRITIN: Ferritin: 170 ng/mL (ref 24–336)

## 2019-01-31 LAB — TROPONIN I (HIGH SENSITIVITY)
Troponin I (High Sensitivity): 609 ng/L (ref <18)
Troponin I (High Sensitivity): 457 ng/L (ref <18)
Troponin I (High Sensitivity): 461 ng/L (ref <18)

## 2019-01-31 LAB — C-REACTIVE PROTEIN: CRP: 1.4 mg/dL — ABNORMAL HIGH (ref <1.0)

## 2019-01-31 MED ORDER — HEPARIN (PORCINE) 25000 UT/250ML-% IV SOLN
1700.0000 [IU]/h | INTRAVENOUS | Status: DC
  Administered 2019-01-31: 1700 [IU]/h via INTRAVENOUS

## 2019-01-31 MED ORDER — ONDANSETRON HCL 4 MG/2ML IJ SOLN: 4.0000 mg | Freq: Four times a day (QID) | INTRAMUSCULAR | Status: DC | PRN

## 2019-01-31 MED ORDER — IPRATROPIUM-ALBUTEROL 0.5-2.5 (3) MG/3ML IN SOLN
3.0000 mL | Freq: Four times a day (QID) | RESPIRATORY_TRACT | Status: DC
  Administered 2019-01-31 (×2): 3 mL via RESPIRATORY_TRACT
  Filled 2019-01-31 (×2): qty 3

## 2019-01-31 MED ORDER — OXYCODONE-ACETAMINOPHEN 5-325 MG PO TABS: 1.0000 | ORAL_TABLET | ORAL | Status: DC | PRN

## 2019-01-31 MED ORDER — ENOXAPARIN SODIUM 40 MG/0.4ML ~~LOC~~ SOLN: 40.0000 mg | Freq: Every day | SUBCUTANEOUS | Status: DC

## 2019-01-31 MED ORDER — AZITHROMYCIN 500 MG PO TABS
500.0000 mg | ORAL_TABLET | Freq: Every day | ORAL | Status: DC
  Administered 2019-01-31: 500 mg via ORAL
  Filled 2019-01-31: qty 1

## 2019-01-31 MED ORDER — ALBUTEROL SULFATE HFA 108 (90 BASE) MCG/ACT IN AERS: 1.0000 | INHALATION_SPRAY | RESPIRATORY_TRACT | Status: DC | PRN

## 2019-01-31 MED ORDER — ASPIRIN EC 81 MG PO TBEC
81.0000 mg | DELAYED_RELEASE_TABLET | Freq: Every day | ORAL | Status: DC
  Administered 2019-01-31: 81 mg via ORAL
  Filled 2019-01-31: qty 1

## 2019-01-31 MED ORDER — ATORVASTATIN CALCIUM 40 MG PO TABS
40.0000 mg | ORAL_TABLET | Freq: Every day | ORAL | Status: DC
  Administered 2019-01-31: 40 mg via ORAL
  Filled 2019-01-31: qty 1

## 2019-01-31 MED ORDER — LEVOTHYROXINE SODIUM 25 MCG PO TABS
12.5000 ug | ORAL_TABLET | Freq: Every day | ORAL | Status: DC
  Administered 2019-01-31: 12.5 ug via ORAL
  Filled 2019-01-31: qty 1

## 2019-01-31 MED ORDER — TIOTROPIUM BROMIDE MONOHYDRATE 18 MCG IN CAPS
18.0000 ug | ORAL_CAPSULE | Freq: Every day | RESPIRATORY_TRACT | Status: DC
  Filled 2019-01-31: qty 5

## 2019-01-31 MED ORDER — MOMETASONE FURO-FORMOTEROL FUM 200-5 MCG/ACT IN AERO
2.0000 | INHALATION_SPRAY | Freq: Two times a day (BID) | RESPIRATORY_TRACT | Status: DC
  Filled 2019-01-31: qty 8.8

## 2019-01-31 MED ORDER — AMLODIPINE BESYLATE 5 MG PO TABS
5.0000 mg | ORAL_TABLET | Freq: Every day | ORAL | Status: DC
  Administered 2019-01-31: 5 mg via ORAL
  Filled 2019-01-31: qty 1

## 2019-01-31 MED ORDER — DEXAMETHASONE 6 MG PO TABS
6.0000 mg | ORAL_TABLET | Freq: Every day | ORAL | Status: DC
  Filled 2019-01-31: qty 1

## 2019-01-31 MED ORDER — ACETAMINOPHEN 325 MG PO TABS: 650.0000 mg | ORAL_TABLET | ORAL | Status: DC | PRN

## 2019-01-31 MED ORDER — METOPROLOL TARTRATE 50 MG PO TABS
50.0000 mg | ORAL_TABLET | Freq: Two times a day (BID) | ORAL | Status: DC
  Filled 2019-01-31: qty 1

## 2019-01-31 MED ORDER — GABAPENTIN 400 MG PO CAPS
400.0000 mg | ORAL_CAPSULE | Freq: Three times a day (TID) | ORAL | Status: DC
  Administered 2019-01-31 (×2): 400 mg via ORAL
  Filled 2019-01-31 (×2): qty 1

## 2019-01-31 MED ORDER — METHYLPREDNISOLONE SODIUM SUCC 125 MG IJ SOLR
60.0000 mg | Freq: Two times a day (BID) | INTRAMUSCULAR | Status: DC
  Administered 2019-01-31: 60 mg via INTRAVENOUS
  Filled 2019-01-31: qty 2

## 2019-01-31 NOTE — Progress Notes (Addendum)
ANTICOAGULATION CONSULT NOTE - Initial Consult  Pharmacy Consult for heparin Indication: chest pain/ACS  Allergies  Allergen Reactions  . Sulfa Antibiotics Other (See Comments)    Causes blisters and skin to peel from inside mouth    Patient Measurements: Height: 6' 6.74" (200 cm) Weight: 280 lb 9.3 oz (127.3 kg) IBW/kg (Calculated) : 93.1 Heparin Dosing Weight: 120kg  Vital Signs: Temp: 98.2 F (36.8 C) (10/17 2242) Temp Source: Oral (10/17 2242) BP: 153/88 (10/17 2242) Pulse Rate: 67 (10/17 2242)   Medical History: Past Medical History:  Diagnosis Date  . Arthritis   . Bronchitis    hx of  . GERD (gastroesophageal reflux disease)   . Hypertension   . Hypothyroidism   . Macular degeneration    right eye  . Restless leg syndrome     Assessment: 73yo male admitted to [facility] 10/16, transferred to [facility] for elevated and increasing troponin, to continue heparin started at OSH.  Labs from OSH: WBC 22.5, Plt 208, trop 0.11, SCr 0.73  Goal of Therapy:  Heparin level 0.3-0.7 units/ml Monitor platelets by anticoagulation protocol: Yes   Plan:  OSH started heparin gtt 10/17 at 1830 with 5000 unit bolus and rate at 1680 units/hr; will continue with heparin 1700 units/hr and monitor heparin levels and CBC.  Wynona Neat, PharmD, BCPS  01/31/2019,1:15 AM   Addendum: Heparin level 0.57, at goal.  Will continue heparin gtt and confirm stable with additional level.  VB 3:15 AM

## 2019-01-31 NOTE — H&P (Signed)
History and Physical    Andrew CoyerKenneth A Key XBJ:[id number] DOB: 10/22/1945 DOA: 01/30/2019  PCP: Patient, No Pcp Per  Patient coming from: [facility]  I have personally briefly reviewed patient's old medical [facility]  Chief Complaint: SOB, CP  HPI: [name] is a 73 y.o. male with medical history significant of HTN, hypothyroidism.  [facility] with c/o CP, SOB, URI symptoms x1 day.  [facility]'s h/p reports that patient had exposure to COVID from his granddaughter (all her co-workers have COVID and she has been up to see patient).  24h prior to admission, symptoms progressed from URI to severe SOB.  He was hypoxic on arrival to the ED, and was put on BIPAP initially.  Given steroids and admitted as a "suspected COVID".  BIPAP weaned off down to 4L via [place].  WBC trended up from 12k to 22k.  Trop trended from 0.01 to 0.11.  Heparin gtt started, CP has resolved completely.  COVID test is still pending.   Review of Systems: As per HPI, otherwise all review of systems negative.  Past Medical History:  Diagnosis Date  . Arthritis   . Bronchitis    hx of  . GERD (gastroesophageal reflux disease)   . Hypertension   . Hypothyroidism   . Macular degeneration    right eye  . Restless leg syndrome     Past Surgical History:  Procedure Laterality Date  . BACK SURGERY     Fusion 2005  . Graves Disease    . JOINT REPLACEMENT  1970   Right knee   . NECK SURGERY    . POSTERIOR CERVICAL FUSION/FORAMINOTOMY  12/05/2011   Procedure: POSTERIOR CERVICAL FUSION/FORAMINOTOMY LEVEL 4;  Surgeon: Reinaldo Meekerandy O Kritzer, MD;  Location: [facility];  Service: Neurosurgery;  Laterality: N/A;  Posterior Cervical fixation Cervical four-Seven,Cervical Decompresson Cervical three     reports that he quit smoking about 7 years ago. His smoking use included cigarettes. He quit after 50.00 years of use. He does not have any smokeless tobacco history  on file. He reports that he does not drink alcohol or use drugs.  Allergies  Allergen Reactions  . Sulfa Antibiotics Other (See Comments)    Causes blisters and skin to peel from inside mouth    No family history on file. Positive possible COVID exposure, see HPI for details.  Prior to Admission medications   Medication Sig Start Date End Date Taking? Authorizing Provider  benazepril (LOTENSIN) 20 MG tablet Take 10 mg by mouth at bedtime.    [provider]  Carboxymethylcellul-Glycerin (OPTIVE) 0.5-0.9 % SOLN Place 1-2 drops into both eyes daily.    [provider]  diazepam (VALIUM) 10 MG tablet Take 10 mg by mouth 4 (four) times daily.    [provider]  HYDROcodone-acetaminophen (VICODIN) 5-500 MG per tablet Take 2 tablets by mouth 3 (three) times daily.    [provider]  levothyroxine (SYNTHROID, LEVOTHROID) 175 MCG tablet Take 175 mcg by mouth daily.    [provider]  Multiple Vitamins-Minerals (PRESERVISION AREDS 2 PO) Take 1 tablet by mouth 2 (two) times daily.    [provider]  OVER THE COUNTER MEDICATION Take 1 tablet by mouth 2 (two) times daily. Over the counter prostate formula    [provider]    Physical Exam: Vitals:   01/30/19 2242  BP: (!) 153/88  Pulse: 67  Resp: 16  Temp: 98.2 F (  36.8 C)  TempSrc: Oral  SpO2: 100%    Constitutional: NAD, calm, comfortable Eyes: PERRL, lids and conjunctivae normal ENMT: Mucous membranes are moist. Posterior pharynx clear of any exudate or lesions.Normal dentition.  Neck: normal, supple, no masses, no thyromegaly Respiratory: Rhonchi present Cardiovascular: Regular rate and rhythm, no murmurs / rubs / gallops. No extremity edema. 2+ pedal pulses. No carotid bruits.  Abdomen: no tenderness, no masses palpated. No hepatosplenomegaly. Bowel sounds positive.  Musculoskeletal: no clubbing / cyanosis. No joint deformity upper and lower extremities. Good ROM,  no contractures. Normal muscle tone.  Skin: no rashes, lesions, ulcers. No induration Neurologic: CN 2-12 grossly intact. Sensation intact, DTR normal. Strength 5/5 in all 4.  Psychiatric: Normal judgment and insight. Alert and oriented x 3. Normal mood.    Labs on Admission: I have personally reviewed following labs and imaging studies  CBC: No results for input(s): WBC, NEUTROABS, HGB, HCT, MCV, PLT in the last 168 hours. Basic Metabolic Panel: No results for input(s): NA, K, CL, CO2, GLUCOSE, BUN, CREATININE, CALCIUM, MG, PHOS in the last 168 hours. GFR: CrCl cannot be calculated (Patient's most recent lab result is older than the maximum 21 days allowed.). Liver Function Tests: No results for input(s): AST, ALT, ALKPHOS, BILITOT, PROT, ALBUMIN in the last 168 hours. No results for input(s): LIPASE, AMYLASE in the last 168 hours. No results for input(s): AMMONIA in the last 168 hours. Coagulation Profile: No results for input(s): INR, PROTIME in the last 168 hours. Cardiac Enzymes: No results for input(s): CKTOTAL, CKMB, CKMBINDEX, TROPONINI in the last 168 hours. BNP (last 3 results) No results for input(s): PROBNP in the last 8760 hours. HbA1C: No results for input(s): HGBA1C in the last 72 hours. CBG: No results for input(s): GLUCAP in the last 168 hours. Lipid Profile: No results for input(s): CHOL, HDL, LDLCALC, TRIG, CHOLHDL, LDLDIRECT in the last 72 hours. Thyroid Function Tests: No results for input(s): TSH, T4TOTAL, FREET4, T3FREE, THYROIDAB in the last 72 hours. Anemia Panel: No results for input(s): VITAMINB12, FOLATE, FERRITIN, TIBC, IRON, RETICCTPCT in the last 72 hours. Urine analysis: No results found for: COLORURINE, APPEARANCEUR, LABSPEC, PHURINE, GLUCOSEU, HGBUR, BILIRUBINUR, KETONESUR, PROTEINUR, UROBILINOGEN, NITRITE, LEUKOCYTESUR  Radiological Exams on Admission: No results found.  EKG: Independently reviewed.  Assessment/Plan Principal Problem:    Chest pain, rule out acute myocardial infarction Active Problems:   Suspected COVID-19 virus infection   Acute respiratory failure with hypoxia (HCC)   HTN (hypertension)    1. CP r/o - 1. CP obs pathway 2. Serial trops 3. Tele monitor 4. NPO 5. Cards eval in AM 6. Continue heparin gtt started at [facility] 7. CP free currently 2. Suspected COVID-19 - vs COPD exacerbation 1. Unfortunately when they decided to transfer him for the troponins, the high suspicion of COVID was not communicated it seems and he got transferred not on airborne precautions.  Nor was it communicated that the primary reason for his admit to [facility] was as a PUI. 2. Getting STAT rapid COVID test now! 3. Continue daily decadron that they started at [facility] (either to treat COVID or COPD). 4. PRN albuterol 5. Scheduled dulera 6. Scheduled spireva 3. HTN - continue amlodipine, lopressor  DVT prophylaxis: Heparin per pharm Code Status: Full Family Communication: No family in room Disposition Plan: Home after admit Consults called: message sent to P. Trent for routine cards eval in AM Admission status: Place in [place], [place] Hospitalists  How to  contact the [facility] Attending or Consulting provider 7A - 7P or covering provider during after hours 7P -7A, for this patient?  1. Check the care team in [facility] and look for a) attending/consulting TR[facility] provider listed and b) the [facility] team listed 2. Log into [email or web address]  Amion Physician Scheduling and messaging for groups and whole hospitals  On call and physician scheduling software for group practices, residents, hospitalists and other medical providers for call, clinic, rotation and shift schedules. OnCall Enterprise is a hospital-wide system for scheduling doctors and paging doctors on call. EasyPlot is for scientific plotting and data analysis.  [email or web address]  and use [place]'s universal password to access. If you do not have the password, please contact  the hospital operator.  3. Locate the [facility] provider you are looking for under [facility] and page to a number that you can be directly reached. 4. If you still have difficulty reaching the provider, please page the [facility] (Director on Call) for the Hospitalists listed on amion for assistance.  01/31/2019, 1:04 AM

## 2019-01-31 NOTE — Plan of Care (Signed)

## 2019-01-31 NOTE — Progress Notes (Signed)
Patient demanding be discharged paged MD reported patient could sign himself out AMA but she did not recommend him going home,patient informed and he decided with wife present at bedside to sign himself out  AMA. Wife begged patient to stay and get treatment he stated he had things he needed to do had to get out of here. He asked me take out him PIV and he removed his tele. Wife stated would call for them a ride home.

## 2019-01-31 NOTE — Consult Note (Addendum)
Cardiology Consultation:   Patient ID: [name] MRN: [id number]; DOB: 1945/10/31  Admit date: 01/30/2019 Date of Consult: 01/31/2019  Primary Care Provider: Patient, No Pcp Per Primary Cardiologist: No primary care provider on file. New Dr. Sallyanne Key Primary Electrophysiologist:  None    Patient Profile:   [name] is a 73 y.o. male with a hx of HTN and hypothyroidism who is being seen today for the evaluation of chest pain and elevated troponin at the request of Dr. Broadus Key.  History of Present Illness:   [name] was transferred from [facility] after presentation chest pain, SOB and URI symptoms, pt had exposure to COVID from granddaughter, prior to admit increased SOB.  He was hypoxic on arrival and placed on BiPAP, given steroids, eventualy Bipap weaned to [place]  WBC 12K but with steroids to 22K, troponin 0.01 to 0.11.  Pt was on IV heparin.   Transferred here and COVID test neg. He is NPO, and currently somewhat confused but following commands and pleasant, earlier he was confused and refusing treatment.   BP 174/94 SR  Afebrile    EKG:  The EKG was personally reviewed and demonstrates:  I cannot find EKG from [facility], EKG pending  Telemetry:  Telemetry was personally reviewed and demonstrates:  SR   HS troponin 609, LDL 136, HDL 35, Tchol 192 TG 106 Na 138, K+ 3.8, Cr 0.81 pro BNP 147, HGB WNL WBC 15   Heart Pathway Score:     Past Medical History:  Diagnosis Date  . Arthritis   . Bronchitis    hx of  . GERD (gastroesophageal reflux disease)   . Hypertension   . Hypothyroidism   . Macular degeneration    right eye  . Restless leg syndrome     Past Surgical History:  Procedure Laterality Date  . BACK SURGERY     Fusion 2005  . Graves Disease    . JOINT REPLACEMENT  1970   Right knee   . NECK SURGERY    . POSTERIOR CERVICAL FUSION/FORAMINOTOMY  12/05/2011   Procedure: POSTERIOR CERVICAL FUSION/FORAMINOTOMY LEVEL 4;  Surgeon: Faythe Ghee, MD;   Location: [facility];  Service: Neurosurgery;  Laterality: N/A;  Posterior Cervical fixation Cervical four-Seven,Cervical Decompresson Cervical three     Home Medications:  Prior to Admission medications   Medication Sig Start Date End Date Taking? Authorizing Provider  benazepril (LOTENSIN) 20 MG tablet Take 10 mg by mouth at bedtime.    [provider]  Carboxymethylcellul-Glycerin (OPTIVE) 0.5-0.9 % SOLN Place 1-2 drops into both eyes daily.    [provider]  diazepam (VALIUM) 10 MG tablet Take 10 mg by mouth 4 (four) times daily.    [provider]  HYDROcodone-acetaminophen (VICODIN) 5-500 MG per tablet Take 2 tablets by mouth 3 (three) times daily.    [provider]  levothyroxine (SYNTHROID, LEVOTHROID) 175 MCG tablet Take 175 mcg by mouth daily.    [provider]  Multiple Vitamins-Minerals (PRESERVISION AREDS 2 PO) Take 1 tablet by mouth 2 (two) times daily.    [provider]  OVER THE COUNTER MEDICATION Take 1 tablet by mouth 2 (two) times daily. Over the counter prostate formula    [provider]    Inpatient Medications: Scheduled Meds: . amLODipine  5 mg Oral Daily  . azithromycin  500 mg Oral Daily  . gabapentin  400 mg Oral TID  . ipratropium-albuterol  3 mL Nebulization Q6H  . levothyroxine  12.5 mcg Oral Q0600  . methylPREDNISolone (SOLU-MEDROL) injection  60 mg Intravenous Q12H  . [START ON 02/01/2019] metoprolol tartrate  50 mg Oral BID  . mometasone-formoterol  2 puff Inhalation BID  . tiotropium  18 mcg Inhalation Daily   Continuous Infusions:  PRN Meds: acetaminophen, albuterol, ondansetron (ZOFRAN) IV, oxyCODONE-acetaminophen  Allergies:    Allergies  Allergen Reactions  . Sulfa Antibiotics Other (See Comments)    Causes blisters and skin to peel from inside mouth    Social History:   Social History   Socioeconomic History  . Marital status: Married    Spouse name: Not on file  .  Number of children: Not on file  . Years of education: Not on file  . Highest education level: Not on file  Occupational History  . Not on file  Social Needs  . Financial resource strain: Not on file  . Food insecurity    Worry: Not on file    Inability: Not on file  . Transportation needs    Medical: Not on file    Non-medical: Not on file  Tobacco Use  . Smoking status: Former Smoker    Years: 50.00    Types: Cigarettes    Quit date: 11/29/2011    Years since quitting: 7.1  Substance and Sexual Activity  . Alcohol use: No  . Drug use: No  . Sexual activity: Not on file  Lifestyle  . Physical activity    Days per week: Not on file    Minutes per session: Not on file  . Stress: Not on file  Relationships  . Social Musician on phone: Not on file    Gets together: Not on file    Attends religious service: Not on file    Active member of club or organization: Not on file    Attends meetings of clubs or organizations: Not on file    Relationship status: Not on file  . Intimate partner violence    Fear of current or ex partner: Not on file    Emotionally abused: Not on file    Physically abused: Not on file    Forced sexual activity: Not on file  Other Topics Concern  . Not on file  Social History Narrative  . Not on file    Family History:    Family History  Problem Relation Age of Onset  . Heart attack Mother 69  . Heart disease Father      ROS:  Please see the history of present illness.  General:no colds or fevers, no weight changes Skin:no rashes or ulcers HEENT:no blurred vision, no congestion CV:see HPI PUL:see HPI GI:no diarrhea constipation or melena, no indigestion GU:no hematuria, no dysuria MS:no joint pain, no claudication Neuro:no syncope, no lightheadedness Endo:no diabetes, + thyroid disease  All other ROS reviewed and negative.     Physical Exam/Data:   Vitals:   01/31/19 0100 01/31/19 0400 01/31/19 0518 01/31/19 0749   BP:   (!) 162/90 (!) 174/94  Pulse:   70 88  Resp:   20 20  Temp:   97.7 F (36.5 C) 97.6 F (36.4 C)  TempSrc:   Oral Oral  SpO2:  100% 100% 94%  Weight: 127.3 kg     Height: 6' 6.74" (2 m)       Intake/Output Summary (Last 24 hours) at 01/31/2019 0911 Last data filed at 01/31/2019 0911 Gross per 24 hour  Intake 50.09 ml  Output 1625 ml  Net -1574.91 ml   Last 3 Weights 01/31/2019 12/06/2011 12/03/2011  Weight (lbs) 280 lb 9.3 oz 304 lb 3.8 oz 305 lb 8 oz  Weight (kg) 127.27 kg 138 kg 138.574 kg     Body mass index is 31.82 kg/m.  General:  Well nourished, well developed, in no acute distress HEENT: normal Lymph: no adenopathy Neck: no JVD Endocrine:  No thryomegaly Vascular: No carotid bruits; pedal pulses 2+ bilaterally  Cardiac:  normal S1, S2; RRR; no murmur gallup rub or click Lungs:  clear to auscultation bilaterally, no wheezing, rhonchi or rales  Abd: soft, nontender, no hepatomegaly  Ext: no edema Musculoskeletal:  No deformities, BUE and BLE strength normal and equal Skin: warm and dry  Neuro:  Oriented to person and time, was not sure what facility he was in., no focal abnormalities noted Psych:  Normal affect     Relevant CV Studies: Echo pending  Laboratory Data:  High Sensitivity Troponin:   Recent Labs  Lab 01/31/19 0136  TROPONINIHS 609*     ChemistryNo results for input(s): NA, K, CL, CO2, GLUCOSE, BUN, CREATININE, CALCIUM, GFRNONAA, GFRAA, ANIONGAP in the last 168 hours.  No results for input(s): PROT, ALBUMIN, AST, ALT, ALKPHOS, BILITOT in the last 168 hours. HematologyNo results for input(s): WBC, RBC, HGB, HCT, MCV, MCH, MCHC, RDW, PLT in the last 168 hours. BNPNo results for input(s): BNP, PROBNP in the last 168 hours.  DDimer No results for input(s): DDIMER in the last 168 hours.   Radiology/Studies:  No results found.  Assessment and Plan:   1. Chest pain with troponin here 609,  In [facility] 0.11 pk  On IV heparin, no pain  currently - waiting for EKG.  This could be demand ischemia from COPD exacerbation but with age, FH HTN would do ischemic eval. Dr. Royann Shiversroitoru to see, would need cath or cardiac CTA tomorrow, ok to eat  Will add ASSA 81 mg daily and on BB, added statin  2. 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 HTN on amlodipine and lopressor  4. HLD LDL 136 will add statin      For questions or updates, please contact [facility] Please consult [email or web address] for contact info under     Signed, Nada BoozerLaura Ingold, NP  01/31/2019 9:11 AM   I have seen and examined the patient along with Nada BoozerLaura Ingold, NP .  I have reviewed the chart, notes and new data.  I agree with PA/NP's note.  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  Blood pressure was severely elevated. Key examination changes: Lying fully supine in bed without respiratory difficulty, but has diffuse wheezing and has to interrupt longer sentences to catch his breath.  Normal cardiovascular exam. Key new findings / data: [facility] were not sent [facility], but his ECG here is completely normal.  Mild elevation in high-sensitivity troponin, downward trending.  Chest x-ray (my review) shows hyperexpanded emphysematous chest, normal heart size, no evidence of heart failure.  PLAN: He has several risk factors for coronary artery disease, but did not really present with an acute coronary syndrome.  The current degree of elevation in cardiac enzymes is small and in the expected range for acutely ill patient with severe hypertension and hypoxia. I think he is best suited for further evaluation with coronary CT angiography.  His heart rate earlier today was a slow 67 bpm and I believe he  will tolerate a single higher-dose of beta-blocker before the CT. He reported to me that he stopped smoking on August 9, his wife's birthday.  Notes from  [facility] reports that he still smokes 5 cigarettes a day. Complete smoking cessation is recommended. Start statin for hypercholesterolemia.    Thurmon Fair, MD, [facility] [phone] 01/31/2019, 11:08 AM

## 2019-01-31 NOTE — Progress Notes (Signed)
Patient is confused was refusing chest xray reports has MD appointment today reminded him today was Sunday and doctor offices are closed he has been in hospital for URI and due to troponins being high and requiring oxygen. He lives at home with wife and normally does not require oxygen related this to patient but he is complaining to wife on phone can't afford all this who is going to pay for all this? He told wife came in for sniffles and covid test and now he has all this going on. Wife tried to explain to him he has more going on than sniffles and he is getting testing done rule out heart attack she reported coming today to sit with him due to confusion help get his testing done today. MD informed of above to go in and talk with him.

## 2019-01-31 NOTE — Progress Notes (Signed)
COVID negative.  Will continue decadron, nebs, and start azithromycin to treat as COPD exacerbation though.

## 2019-01-31 NOTE — Progress Notes (Addendum)
Patient seen and examined, admitted earlier this morning by Dr. Alcario Drought, please see his H&P for details - this is a 73 year old male with history of long-term tobacco abuse suspected COPD, hypertension, hypothyroidism, arthritis was seen at [facility] emergency room yesterday due to cough congestion shortness of breath and wheezing, he reportedly went there to have a COVID test since his granddaughter was positive for COVID and he was exposed to her -In the ED he was found to be hypoxic wheezing, white count was noted to be 22,000, and troponin I was 0.11, subsequently transferred to [facility] -Patient extremely upset about being here wants to go home reports that he feels just fine, not interested in having any further work-up  1.  COPD exacerbation -He has diffuse expiratory wheezes with mild hypoxemia -Outside hospital x-ray was unremarkable will repeat 1 -COVID-19 PCR was negative -Start IV Solu-Medrol every 12, duo nebs -Wean O2 as tolerated  2.  Elevated troponin, troponin I at outside hospital was 0.11, high-sensitivity troponin here was considerably elevated at 607 and 457 at repeat -I suspect this is secondary to demand ischemia from COPD exacerbation he has no symptoms of ACS, in fact is actively wheezing at this time -Outside hospital EKG report was unremarkable will repeat -We will trend troponin, continue aspirin and beta-blocker -Check 2D echocardiogram -Patient tells me that he does not want to have further cardiac work-up in the hospital but on further course and agrees to get an echocardiogram  3.  Leukocytosis -I suspect this is secondary to recent steroids started at [facility], initial white count was almost normal, afebrile will monitor  Domenic Polite, MD

## 2019-01-31 NOTE — Progress Notes (Signed)
Notify provider lab result troponin sensitivity 609. Follow up new order.

## 2019-01-31 NOTE — Progress Notes (Signed)
*  PRELIMINARY RESULTS* Echocardiogram 2D Echocardiogram has been performed.  Leavy Cella 01/31/2019, 3:11 PM

## 2019-02-12 NOTE — Discharge Summary (Signed)
Physician Discharge Summary  [name] YDX:[id number] DOB: 1946/04/06 DOA: 01/30/2019  PCP: Patient, No Pcp Per  Admit date: 01/30/2019 Discharge date: 01/31/2019  Time spent:  minutes  Recommendations for Outpatient Follow-up:  1. Left AMA   Discharge Diagnoses:  Principal Problem:   Chest pain, rule out acute myocardial infarction Active Problems:   COPD with acute exacerbation (HCC)   Acute respiratory failure with hypoxia (HCC)   HTN (hypertension)   Discharge Condition:   Diet recommendation:   Filed Weights   01/31/19 0100  Weight: 127.3 kg    History of present illness:   73 year old male with history of long-term tobacco abuse suspected COPD, hypertension, hypothyroidism, arthritis was seen at [facility] emergency room yesterday due to cough congestion shortness of breath and wheezing, he reportedly went there to have a COVID test since his granddaughter was positive for COVID and he was exposed to her -In the ED he was found to be hypoxic wheezing, white count was noted to be 22,000, and troponin I was 0.11, subsequently transferred to [facility] -Patient extremely upset about being here wants to go home reports that he feels just fine, not interested in having any further work-up   Hospital Course:  1.  COPD exacerbation -He has diffuse expiratory wheezes with mild hypoxemia -Outside hospital x-ray was unremarkable -COVID-19 PCR was negative -Was started on IV Solu-Medrol, duo nebs, O2, supportive care, left AMA the same evening  2.  Elevated troponin, troponin I at outside hospital was 0.11, high-sensitivity troponin here was considerably elevated at 607 and 457 at repeat -I suspect this is secondary to demand ischemia from COPD exacerbation he has no symptoms of ACS, in fact is actively wheezing at this time -Outside hospital EKG report was unremarkable will repeat -Patient tells me that he does not want to have further cardiac  work-up in the hospital but on further course and agrees to get an echocardiogram -Recommended need for further work-up but patient left AMA  Discharge Exam: Vitals:   01/31/19 1136 01/31/19 1454  BP: 129/74   Pulse: 85   Resp: 16   Temp: 97.9 F (36.6 C)   SpO2: 92% 94%    General: AAOx3 Cardiovascular: S1-S2, regular rate rhythm Respiratory: Diffuse expiratory wheezes  Discharge Instructions    Allergies as of 01/31/2019      Reactions   Sulfa Antibiotics Other (See Comments)   Causes blisters and skin to peel from inside mouth      Medication List    ASK your doctor about these medications   benazepril 20 MG tablet Commonly known as: LOTENSIN Take 20 mg by mouth at bedtime.   DULoxetine 60 MG capsule Commonly known as: CYMBALTA Take 60 mg by mouth at bedtime.   levothyroxine 137 MCG tablet Commonly known as: SYNTHROID Take 137 mcg by mouth daily. Ask about: Which instructions should I use?   OPTIVE 0.5-0.9 % ophthalmic solution Generic drug: carboxymethylcellul-glycerin Place 1-2 drops into both eyes daily.   PRESERVISION AREDS 2 PO Take 1 tablet by mouth 2 (two) times daily.      Allergies  Allergen Reactions  . Sulfa Antibiotics Other (See Comments)    Causes blisters and skin to peel from inside mouth      The results of significant diagnostics from this hospitalization (including imaging, microbiology, ancillary and laboratory) are listed below for reference.    Significant Diagnostic Studies: Dg Chest 2 View  Result Date: 01/31/2019 CLINICAL DATA:  Hypertension and hypothyroidism.  EXAM: CHEST - 2 VIEW COMPARISON:  January 29, 2019 FINDINGS: Flattening of the diaphragms on the lateral view suggest hyperinflation. Nodular density projects over 2 midthoracic vertebral bodies, not seen on previous imaging. No other acute abnormalities. The heart, hila, mediastinum, and pleura are unremarkable. No focal infiltrate. IMPRESSION: 1. A nodular density  projects over the midthoracic vertebral body. It is unclear whether this is in the lung or bony in nature. Recommend a CT scan for further evaluation. 2. No other acute abnormalities. Hyperinflation of the lungs, unchanged since 2013. Electronically Signed   By: Gerome Sam III M.D   On: 01/31/2019 11:48    Microbiology: No results found for this or any previous visit (from the past 240 hour(s)).   Labs: Basic Metabolic Panel: No results for input(s): NA, K, CL, CO2, GLUCOSE, BUN, CREATININE, CALCIUM, MG, PHOS in the last 168 hours. Liver Function Tests: No results for input(s): AST, ALT, ALKPHOS, BILITOT, PROT, ALBUMIN in the last 168 hours. No results for input(s): LIPASE, AMYLASE in the last 168 hours. No results for input(s): AMMONIA in the last 168 hours. CBC: No results for input(s): WBC, NEUTROABS, HGB, HCT, MCV, PLT in the last 168 hours. Cardiac Enzymes: No results for input(s): CKTOTAL, CKMB, CKMBINDEX, TROPONINI in the last 168 hours. BNP: BNP (last 3 results) No results for input(s): BNP in the last 8760 hours.  ProBNP (last 3 results) No results for input(s): PROBNP in the last 8760 hours.  CBG: No results for input(s): GLUCAP in the last 168 hours.     Signed:  Zannie Cove MD.  [facility] 02/12/2019, 3:07 PM

## 2020-07-04 ENCOUNTER — Encounter: Payer: Self-pay | Admitting: Urology

## 2020-07-04 ENCOUNTER — Ambulatory Visit (INDEPENDENT_AMBULATORY_CARE_PROVIDER_SITE_OTHER): Payer: Medicare Other | Admitting: Urology

## 2020-07-04 ENCOUNTER — Other Ambulatory Visit: Payer: Self-pay

## 2020-07-04 VITALS — BP 109/71 | HR 128 | Temp 97.6°F | Ht 79.0 in | Wt 251.0 lb

## 2020-07-04 DIAGNOSIS — N138 Other obstructive and reflux uropathy: Secondary | ICD-10-CM | POA: Diagnosis not present

## 2020-07-04 DIAGNOSIS — N200 Calculus of kidney: Secondary | ICD-10-CM | POA: Insufficient documentation

## 2020-07-04 DIAGNOSIS — N3000 Acute cystitis without hematuria: Secondary | ICD-10-CM

## 2020-07-04 DIAGNOSIS — N400 Enlarged prostate without lower urinary tract symptoms: Secondary | ICD-10-CM | POA: Insufficient documentation

## 2020-07-04 DIAGNOSIS — N401 Enlarged prostate with lower urinary tract symptoms: Secondary | ICD-10-CM | POA: Diagnosis not present

## 2020-07-04 LAB — URINALYSIS, ROUTINE W REFLEX MICROSCOPIC
Bilirubin, UA: NEGATIVE
Glucose, UA: NEGATIVE
Ketones, UA: NEGATIVE
Nitrite, UA: NEGATIVE
Specific Gravity, UA: <1.005 — ABNORMAL LOW (ref 1.005–1.030)
Urobilinogen, Ur: 0.2 mg/dL (ref 0.2–1.0)
pH, UA: 6 (ref 5.0–7.5)

## 2020-07-04 LAB — MICROSCOPIC EXAMINATION: Renal Epithel, UA: NONE SEEN /hpf

## 2020-07-04 LAB — BLADDER SCAN AMB NON-IMAGING: Scan Result: 315

## 2020-07-04 MED ORDER — ALFUZOSIN HCL ER 10 MG PO TB24: 10.0000 mg | ORAL_TABLET | Freq: Every day | ORAL | 11 refills | Status: DC

## 2020-07-04 MED ORDER — NITROFURANTOIN MONOHYD MACRO 100 MG PO CAPS: 100.0000 mg | ORAL_CAPSULE | Freq: Two times a day (BID) | ORAL | 0 refills | Status: DC

## 2020-07-04 NOTE — Progress Notes (Signed)
07/04/2020 1:58 PM   [name] October 05, 1945 [id number]  Referring provider: No referring provider defined for this encounter.  Nephrolithiasis and urinary frequency  HPI: Mr [name] is a 75yo here for evaluation of nephrolithiasis and urinary frequency. He previously saw a Insurance underwriter at [employer] in 2019 for hematuria and underwent cystoscopy and CT hematuria. CT scan showed a punctate left renal calculus. Since then they have been treated at least 5-6 times for a UTI. NO prior BPH therapy. UA today is concerning for infection.  PVR 315cc. He denies a feeling of incomplete emptying.  IPSS 19 QOL 6   PMH: Past Medical History:  Diagnosis Date  . Arthritis   . Bronchitis    hx of  . GERD (gastroesophageal reflux disease)   . Hypertension   . Hypothyroidism   . Macular degeneration    right eye  . Restless leg syndrome     Surgical History: Past Surgical History:  Procedure Laterality Date  . BACK SURGERY     Fusion 2005  . Graves Disease    . JOINT REPLACEMENT  1970   Right knee   . NECK SURGERY    . POSTERIOR CERVICAL FUSION/FORAMINOTOMY  12/05/2011   Procedure: POSTERIOR CERVICAL FUSION/FORAMINOTOMY LEVEL 4;  Surgeon: Reinaldo Meeker, MD;  Location: [facility];  Service: Neurosurgery;  Laterality: N/A;  Posterior Cervical fixation Cervical four-Seven,Cervical Decompresson Cervical three    Home Medications:  Allergies as of 07/04/2020      Reactions   Sulfa Antibiotics Other (See Comments)   Causes blisters and skin to peel from inside mouth      Medication List       Accurate as of July 04, 2020  1:58 PM. If you have any questions, ask your nurse or doctor.        benazepril 20 MG tablet Commonly known as: LOTENSIN Take 20 mg by mouth at bedtime.   carboxymethylcellul-glycerin 0.5-0.9 % ophthalmic solution Commonly known as: REFRESH OPTIVE Place 1-2 drops into both eyes daily.   DULoxetine 60 MG capsule Commonly known as:  CYMBALTA Take 60 mg by mouth at bedtime.   levothyroxine 137 MCG tablet Commonly known as: SYNTHROID Take 137 mcg by mouth daily.   levothyroxine 125 MCG tablet Commonly known as: SYNTHROID   PRESERVISION AREDS 2 PO Take 1 tablet by mouth 2 (two) times daily.   Valium 2 MG tablet Generic drug: diazepam SMARTSIG:1 Tablet(s) By Mouth Every 12 Hours PRN   Vitamin D3 1.25 MG (50000 UT) Tabs Take by mouth.       Allergies:  Allergies  Allergen Reactions  . Sulfa Antibiotics Other (See Comments)    Causes blisters and skin to peel from inside mouth    Family History: Family History  Problem Relation Age of Onset  . Heart attack Mother 37  . Heart disease Father     Social History:  reports that he quit smoking about 8 years ago. His smoking use included cigarettes. He quit after 50.00 years of use. He does not have any smokeless tobacco history on file. He reports that he does not drink alcohol and does not use drugs.  ROS: All other review of systems were reviewed and are negative except what is noted above in HPI  Physical Exam: BP 109/71   Pulse (!) 128   Temp 97.6 F (36.4 C)   Ht 6\' 7"  (2.007 m)   Wt 251 lb (113.9 kg)   BMI 28.28 kg/m  Constitutional:  Alert and oriented, No acute distress. HEENT: [place] AT, moist mucus membranes.  Trachea midline, no masses. Cardiovascular: No clubbing, cyanosis, or edema. Respiratory: Normal respiratory effort, no increased work of breathing. GI: Abdomen is soft, nontender, nondistended, no abdominal masses GU: No CVA tenderness.  Lymph: No cervical or inguinal lymphadenopathy. Skin: No rashes, bruises or suspicious lesions. Neurologic: Grossly intact, no focal deficits, moving all 4 extremities. Psychiatric: Normal mood and affect.  Laboratory Data: Lab Results  Component Value Date   WBC 25.1 (H) 01/31/2019   HGB 15.3 01/31/2019   HCT 45.1 01/31/2019   MCV 93.8 01/31/2019   PLT 236 01/31/2019    Lab Results   Component Value Date   CREATININE 0.97 01/31/2019    No results found for: PSA  No results found for: TESTOSTERONE  No results found for: HGBA1C  Urinalysis No results found for: COLORURINE, APPEARANCEUR, LABSPEC, PHURINE, GLUCOSEU, HGBUR, BILIRUBINUR, KETONESUR, PROTEINUR, UROBILINOGEN, NITRITE, LEUKOCYTESUR  No results found for: LABMICR, WBCUA, RBCUA, LABEPIT, MUCUS, BACTERIA  Pertinent Imaging:  No results found for this or any previous visit.  No results found for this or any previous visit.  No results found for this or any previous visit.  No results found for this or any previous visit.  No results found for this or any previous visit.  No results found for this or any previous visit.  No results found for this or any previous visit.  No results found for this or any previous visit.   Assessment & Plan:    1. Kidney stones -CT stone study - Urinalysis, Routine w reflex microscopic  2. BPH with LUTS, nocturia -We will start uroxatral 10mg  QHS. RTC 4 weeks with PVR  3. Acute cystitis -Urine for culture -macrobid 100mg  qhs   No follow-ups on file.  , MD  [facility] [place]

## 2020-07-04 NOTE — Progress Notes (Signed)
Bladder Scan Patient can void: 315 ml Performed By: Bridgette Habermann, lpn   Urological Symptom Review  Patient is experiencing the following symptoms: Frequent urination Hard to postpone urination Burning/pain with urination Get up at night to urinate Leakage of urine Stream starts and stops Trouble starting stream Blood in urine Urinary tract infection Injury to kidneys/bladder Weak stream Erection problems (male only)   Review of Systems  Gastrointestinal (upper)  : Indigestion/heartburn  Gastrointestinal (lower) : Diarrhea  Constitutional : Fatigue  Skin: Skin rash/lesion  Eyes: Blurred vision  Ear/Nose/Throat : Sinus problems  Hematologic/Lymphatic: Easy bruising  Cardiovascular : Negative for cardiovascular symptoms  Respiratory : Cough Shortness of breath  Endocrine: Negative for endocrine symptoms  Musculoskeletal: Back pain Joint pain  Neurological: Negative for neurological symptoms  Psychologic: Negative for psychiatric symptoms

## 2020-07-04 NOTE — Patient Instructions (Signed)

## 2020-07-07 ENCOUNTER — Telehealth: Payer: Self-pay

## 2020-07-07 LAB — URINE CULTURE

## 2020-07-07 NOTE — Telephone Encounter (Signed)
Notified Jessica, nurse at [facility],  of urine culture result  Being sensitivity and to macrobid and to continue all medication.

## 2020-08-10 ENCOUNTER — Ambulatory Visit (HOSPITAL_COMMUNITY)
Admission: RE | Admit: 2020-08-10 | Discharge: 2020-08-10 | Disposition: A | Discharge status: Home / Self Care | Payer: Medicare Other | Source: Ambulatory Visit | Attending: Urology | Admitting: Urology

## 2020-08-10 ENCOUNTER — Other Ambulatory Visit: Payer: Self-pay

## 2020-08-10 DIAGNOSIS — N200 Calculus of kidney: Secondary | ICD-10-CM | POA: Insufficient documentation

## 2020-08-18 ENCOUNTER — Other Ambulatory Visit: Payer: Self-pay

## 2020-08-18 ENCOUNTER — Ambulatory Visit (INDEPENDENT_AMBULATORY_CARE_PROVIDER_SITE_OTHER): Payer: Medicare Other | Admitting: Urology

## 2020-08-18 ENCOUNTER — Encounter: Payer: Self-pay | Admitting: Urology

## 2020-08-18 VITALS — BP 120/75 | HR 106 | Temp 98.1°F | Ht 79.0 in | Wt 253.0 lb

## 2020-08-18 DIAGNOSIS — N200 Calculus of kidney: Secondary | ICD-10-CM

## 2020-08-18 DIAGNOSIS — N138 Other obstructive and reflux uropathy: Secondary | ICD-10-CM

## 2020-08-18 DIAGNOSIS — N401 Enlarged prostate with lower urinary tract symptoms: Secondary | ICD-10-CM | POA: Diagnosis not present

## 2020-08-18 DIAGNOSIS — R351 Nocturia: Secondary | ICD-10-CM

## 2020-08-18 DIAGNOSIS — N3000 Acute cystitis without hematuria: Secondary | ICD-10-CM | POA: Diagnosis not present

## 2020-08-18 LAB — BLADDER SCAN AMB NON-IMAGING: Scan Result: 58

## 2020-08-18 MED ORDER — ALFUZOSIN HCL ER 10 MG PO TB24: 10.0000 mg | ORAL_TABLET | Freq: Every day | ORAL | 3 refills | Status: DC

## 2020-08-18 NOTE — Progress Notes (Signed)
post void residual=58  Urological Symptom Review  Patient is experiencing the following symptoms: Get up at night to urinate Leakage of urine Stream starts and stops Trouble starting stream Have to strain to urinate Urinary tract infection Injury to kidneys/bladder Erection problems (male only)   Review of Systems  Gastrointestinal (upper)  : Indigestion/heartburn  Gastrointestinal (lower) : Diarrhea Constipation  Constitutional : Fatigue  Skin: Skin rash/lesion  Eyes: Blurred vision  Ear/Nose/Throat : Sinus problems  Hematologic/Lymphatic: Easy bruising  Cardiovascular : Negative for cardiovascular symptoms  Respiratory : Shortness of breath  Endocrine: Excessive thirst  Musculoskeletal: Back pain Joint pain  Neurological: Dizziness  Psychologic: Depression

## 2020-08-18 NOTE — Patient Instructions (Signed)

## 2020-08-18 NOTE — Progress Notes (Signed)
08/18/2020 2:55 PM   [name] 09-14-1945 [id number]  Referring provider: No referring provider defined for this encounter.  Followup BPH  HPI: [name] is a 74yo here for followup for BPH with incomplete emptying. PVR 58cc. He is currently on urixatral 10mg  qhs. Urine stream improved. Nocturia 1-2x. IPSS 15 QOL 3. Ct stone study shows a 109mm left renal calculus. No other complaints today   PMH: Past Medical History:  Diagnosis Date  . Arthritis   . Bronchitis    hx of  . GERD (gastroesophageal reflux disease)   . Hypertension   . Hypothyroidism   . Macular degeneration    right eye  . Restless leg syndrome     Surgical History: Past Surgical History:  Procedure Laterality Date  . BACK SURGERY     Fusion 2005  . Graves Disease    . JOINT REPLACEMENT  1970   Right knee   . NECK SURGERY    . POSTERIOR CERVICAL FUSION/FORAMINOTOMY  12/05/2011   Procedure: POSTERIOR CERVICAL FUSION/FORAMINOTOMY LEVEL 4;  Surgeon: 12/07/2011, MD;  Location: [facility];  Service: Neurosurgery;  Laterality: N/A;  Posterior Cervical fixation Cervical four-Seven,Cervical Decompresson Cervical three    Home Medications:  Allergies as of 08/18/2020      Reactions   Sulfa Antibiotics Other (See Comments)   Causes blisters and skin to peel from inside mouth      Medication List       Accurate as of Aug 18, 2020  2:55 PM. If you have any questions, ask your nurse or doctor.        alfuzosin 10 MG 24 hr tablet Commonly known as: UROXATRAL Take 1 tablet (10 mg total) by mouth daily with breakfast.   benazepril 20 MG tablet Commonly known as: LOTENSIN Take 20 mg by mouth at bedtime.   carboxymethylcellul-glycerin 0.5-0.9 % ophthalmic solution Commonly known as: REFRESH OPTIVE Place 1-2 drops into both eyes daily.   DULoxetine 60 MG capsule Commonly known as: CYMBALTA Take 60 mg by mouth at bedtime.   levothyroxine 137 MCG tablet Commonly known as:  SYNTHROID Take 137 mcg by mouth daily.   levothyroxine 125 MCG tablet Commonly known as: SYNTHROID   nitrofurantoin (macrocrystal-monohydrate) 100 MG capsule Commonly known as: MACROBID Take 1 capsule (100 mg total) by mouth every 12 (twelve) hours.   PRESERVISION AREDS 2 PO Take 1 tablet by mouth 2 (two) times daily.   Valium 2 MG tablet Generic drug: diazepam SMARTSIG:1 Tablet(s) By Mouth Every 12 Hours PRN   Vitamin D3 1.25 MG (50000 UT) Tabs Take by mouth.       Allergies:  Allergies  Allergen Reactions  . Sulfa Antibiotics Other (See Comments)    Causes blisters and skin to peel from inside mouth    Family History: Family History  Problem Relation Age of Onset  . Heart attack Mother 74  . Heart disease Father     Social History:  reports that he quit smoking about 8 years ago. His smoking use included cigarettes. He quit after 50.00 years of use. He does not have any smokeless tobacco history on file. He reports that he does not drink alcohol and does not use drugs.  ROS: All other review of systems were reviewed and are negative except what is noted above in HPI  Physical Exam: BP 120/75   Pulse (!) 106   Temp 98.1 F (36.7 C)   Ht 6\' 7"  (2.007 m)   Wt  253 lb (114.8 kg)   BMI 28.50 kg/m   Constitutional:  Alert and oriented, No acute distress. HEENT: [place] AT, moist mucus membranes.  Trachea midline, no masses. Cardiovascular: No clubbing, cyanosis, or edema. Respiratory: Normal respiratory effort, no increased work of breathing. GI: Abdomen is soft, nontender, nondistended, no abdominal masses GU: No CVA tenderness.  Lymph: No cervical or inguinal lymphadenopathy. Skin: No rashes, bruises or suspicious lesions. Neurologic: Grossly intact, no focal deficits, moving all 4 extremities. Psychiatric: Normal mood and affect.  Laboratory Data: Lab Results  Component Value Date   WBC 25.1 (H) 01/31/2019   HGB 15.3 01/31/2019   HCT 45.1 01/31/2019   MCV  93.8 01/31/2019   PLT 236 01/31/2019    Lab Results  Component Value Date   CREATININE 0.97 01/31/2019    No results found for: PSA  No results found for: TESTOSTERONE  No results found for: HGBA1C  Urinalysis    Component Value Date/Time   APPEARANCEUR Cloudy (A) 07/04/2020 1344   GLUCOSEU Negative 07/04/2020 1344   BILIRUBINUR Negative 07/04/2020 1344   PROTEINUR Trace (A) 07/04/2020 1344   NITRITE Negative 07/04/2020 1344   LEUKOCYTESUR 2+ (A) 07/04/2020 1344    Lab Results  Component Value Date   LABMICR See below: 07/04/2020   WBCUA 11-30 (A) 07/04/2020   LABEPIT 0-10 07/04/2020   MUCUS Present 07/04/2020   BACTERIA Many (A) 07/04/2020    Pertinent Imaging: CT stone study: Images reviewed and discussed with the patient No results found for this or any previous visit.  No results found for this or any previous visit.  No results found for this or any previous visit.  No results found for this or any previous visit.  No results found for this or any previous visit.  No results found for this or any previous visit.  No results found for this or any previous visit.  Results for orders placed during the hospital encounter of 08/10/20  CT RENAL STONE STUDY  Narrative CLINICAL DATA:  Urinary tract calculus, follow-up nephrolithiasis in a patient with urinary frequency.  EXAM: CT ABDOMEN AND PELVIS WITHOUT CONTRAST  TECHNIQUE: Multidetector CT imaging of the abdomen and pelvis was performed following the standard protocol without IV contrast.  COMPARISON:  June 28, 2004.  FINDINGS: Lower chest: Lung bases are clear. No effusion. No consolidation. Three-vessel coronary artery disease. Heart is incompletely imaged without pericardial effusion. Normal heart size.  Hepatobiliary: Cholelithiasis. No gross biliary duct distension. The liver unremarkable on noncontrast imaging.  Pancreas: Normal, without mass, inflammation or ductal  dilatation.  Spleen: Subtle density focus in the spleen likely benign though too small for definitive characterization and 9 mm.  Adrenals/Urinary Tract: Adrenal glands are normal.  Cystic renal lesions bilaterally. Cysts arise from the bilateral kidneys. Largest arises from the lower pole of the RIGHT kidney measuring 4.6 x 3.2 cm. Density measurements for renal lesions less than 20 Hounsfield units. Some of the measurements obtained in the lower pole may be compromised by streak artifact from adjacent spinal hardware.  Kidneys show malrotation with renal pelves directed anteriorly. No hydronephrosis. No ureteral calculi.  Punctate LEFT intrarenal calculus measures approximately 2 mm in the upper pole. No perinephric stranding. Urinary bladder with smooth contours.  Stomach/Bowel: Scattered fluid-filled loops of small bowel. No signs of bowel obstruction or adjacent stranding. Stomach without dilation or adjacent stranding.  Appendix is normal.  Vascular/Lymphatic: Calcified atheromatous plaque in the abdominal aorta measuring 3.0 x 2.9 cm. Smooth contour of the  IVC. There is no gastrohepatic or hepatoduodenal ligament lymphadenopathy. No retroperitoneal or mesenteric lymphadenopathy.  No pelvic sidewall lymphadenopathy.  Reproductive: Unremarkable.  Other: No ascites.  Musculoskeletal: Spinal degenerative changes with areas of lumbar spinal narrowing due to facet arthropathy, short pedicles and degenerative disc disease showing a similar appearance to prior imaging. Laminectomy changes and changes of spinal fusion in the lumbar spine with similar pattern. Spinal fusion extending from L3 through L5. Degenerative disc disease causes further narrowing at L1-2 than on previous imaging still with considerable narrowing at the T12-L1 level also worse than on the prior study. No acute bone finding. No destructive bone process.  Degenerative changes in the hips RIGHT greater  than LEFT.  IMPRESSION: 1. Nephrolithiasis without hydronephrosis or perinephric stranding. 2. Spinal degenerative changes with signs of narrowing of the central canal due to facet and degenerative disc disease along with short pedicles this appears worse particularly at the T12-L1 in the L1-L2 levels. Multilevel spinal fusion as before. Correlate with any worsening symptoms. 3. Renal cysts bilaterally. Given compromised density measurements in the setting of spinal hardware would consider sonographic follow-up of the kidneys in 6 months to confirm simple cystic features. 4. Cholelithiasis. 5. Three-vessel coronary artery disease. 6.  atherosclerosis.  Aortic Atherosclerosis (ICD10-I70.0).   Electronically Signed By: Donzetta Kohut M.D. On: 08/11/2020 08:43   Assessment & Plan:    1. Nocturia -Continue uroxatral 10mg  qhs  2. Kidney stones -continue observation - BLADDER SCAN AMB NON-IMAGING - Urinalysis, Routine w reflex microscopic  3. Benign prostatic hyperplasia with urinary obstruction -continue uroxatral 10mg  qhs  4. Acute cystitis without hematuria - Urine Culture   Return in about 6 months (around 02/18/2021) for PVR.  , MD  [facility] [place]

## 2020-08-19 LAB — URINALYSIS, ROUTINE W REFLEX MICROSCOPIC
Bilirubin, UA: NEGATIVE
Glucose, UA: NEGATIVE
Ketones, UA: NEGATIVE
Nitrite, UA: POSITIVE — AB
Specific Gravity, UA: 1.01 (ref 1.005–1.030)
Urobilinogen, Ur: 1 mg/dL (ref 0.2–1.0)
pH, UA: 6 (ref 5.0–7.5)

## 2020-08-19 LAB — MICROSCOPIC EXAMINATION: WBC, UA: >30 /hpf — AB (ref 0–5)

## 2020-08-25 LAB — URINE CULTURE

## 2020-09-12 ENCOUNTER — Telehealth: Payer: Self-pay

## 2020-09-12 NOTE — Telephone Encounter (Signed)
Called patient- no answer. No way to leave message.

## 2020-09-12 NOTE — Telephone Encounter (Signed)
-----   Message from Malen Gauze, MD sent at 09/12/2020 11:51 AM EDT ----- Ct shows bilateral nonobstructing calculi ----- Message ----- From: Ferdinand Lango, RN Sent: 08/11/2020   9:17 AM EDT To: Malen Gauze, MD  Please review

## 2020-09-12 NOTE — Telephone Encounter (Signed)
-----   Message from Malen Gauze, MD sent at 09/12/2020 10:03 AM EDT ----- Macrobid 100mg  BID for 7 days ----- Message ----- From: , RN Sent: 08/25/2020   2:38 PM EDT To: 08/27/2020, MD  Please review

## 2020-09-14 ENCOUNTER — Other Ambulatory Visit: Payer: Self-pay

## 2020-09-14 DIAGNOSIS — N200 Calculus of kidney: Secondary | ICD-10-CM

## 2020-09-14 MED ORDER — NITROFURANTOIN MONOHYD MACRO 100 MG PO CAPS: 100.0000 mg | ORAL_CAPSULE | Freq: Two times a day (BID) | ORAL | 0 refills | Status: DC

## 2020-09-14 NOTE — Telephone Encounter (Signed)
Rx sent. Spoke with Shanda Bumps at [facility] [place]. Notified of CT and rx sent to pharmacy.

## 2020-09-21 ENCOUNTER — Other Ambulatory Visit (HOSPITAL_COMMUNITY): Payer: Self-pay | Admitting: Neurosurgery

## 2020-09-21 DIAGNOSIS — M4714 Other spondylosis with myelopathy, thoracic region: Secondary | ICD-10-CM

## 2020-09-21 DIAGNOSIS — M48062 Spinal stenosis, lumbar region with neurogenic claudication: Secondary | ICD-10-CM

## 2020-10-06 ENCOUNTER — Other Ambulatory Visit: Payer: Self-pay

## 2020-10-06 ENCOUNTER — Ambulatory Visit (HOSPITAL_COMMUNITY)
Admission: RE | Admit: 2020-10-06 | Discharge: 2020-10-06 | Disposition: A | Discharge status: Home / Self Care | Payer: Medicare Other | Source: Ambulatory Visit | Attending: Neurosurgery | Admitting: Neurosurgery

## 2020-10-06 DIAGNOSIS — M4714 Other spondylosis with myelopathy, thoracic region: Secondary | ICD-10-CM | POA: Diagnosis present

## 2020-10-06 DIAGNOSIS — M48062 Spinal stenosis, lumbar region with neurogenic claudication: Secondary | ICD-10-CM | POA: Insufficient documentation

## 2020-11-09 ENCOUNTER — Other Ambulatory Visit: Payer: Self-pay | Admitting: Neurosurgery

## 2020-11-09 DIAGNOSIS — M4714 Other spondylosis with myelopathy, thoracic region: Secondary | ICD-10-CM

## 2020-11-09 DIAGNOSIS — M542 Cervicalgia: Secondary | ICD-10-CM

## 2020-11-09 DIAGNOSIS — M48062 Spinal stenosis, lumbar region with neurogenic claudication: Secondary | ICD-10-CM

## 2020-12-11 ENCOUNTER — Ambulatory Visit (HOSPITAL_COMMUNITY)
Admission: RE | Admit: 2020-12-11 | Discharge: 2020-12-11 | Disposition: A | Discharge status: Home / Self Care | Payer: Medicare Other | Source: Ambulatory Visit | Attending: Neurosurgery | Admitting: Neurosurgery

## 2020-12-11 ENCOUNTER — Other Ambulatory Visit: Payer: Self-pay

## 2020-12-11 DIAGNOSIS — M542 Cervicalgia: Secondary | ICD-10-CM | POA: Insufficient documentation

## 2020-12-11 DIAGNOSIS — M4714 Other spondylosis with myelopathy, thoracic region: Secondary | ICD-10-CM

## 2020-12-11 DIAGNOSIS — M48062 Spinal stenosis, lumbar region with neurogenic claudication: Secondary | ICD-10-CM | POA: Insufficient documentation

## 2020-12-19 ENCOUNTER — Other Ambulatory Visit: Payer: Self-pay | Admitting: Neurosurgery

## 2020-12-21 ENCOUNTER — Other Ambulatory Visit: Payer: Self-pay | Admitting: Neurosurgery

## 2020-12-25 ENCOUNTER — Encounter (HOSPITAL_COMMUNITY): Payer: Self-pay | Admitting: Neurosurgery

## 2020-12-25 ENCOUNTER — Other Ambulatory Visit: Payer: Self-pay

## 2020-12-25 NOTE — Progress Notes (Signed)
Pt is a resident at [facility]. I spoke with [name], Resident Care Coordinator. I gave her pre-op instructions, the assisted living staff gives [name] medications to him so I gave [name] what medications he could take in the AM. I did speak with [name] who is alert, oriented and able to speak for himself. He denies cardiac history or Diabetes. He is treated for HTN.   Pt will need a Covid test done on arrival.

## 2020-12-26 ENCOUNTER — Inpatient Hospital Stay (HOSPITAL_COMMUNITY): Admission: RE | Admit: 2020-12-26 | Payer: Medicare Other | Source: Home / Self Care | Admitting: Neurosurgery

## 2020-12-26 HISTORY — DX: Personal history of urinary calculi: Z87.442

## 2020-12-26 HISTORY — DX: Chronic obstructive pulmonary disease, unspecified: J44.9

## 2020-12-26 SURGERY — THORACIC DISCECTOMY
Anesthesia: General | Site: Back

## 2021-01-05 ENCOUNTER — Other Ambulatory Visit: Payer: Self-pay | Admitting: Neurosurgery

## 2021-01-05 NOTE — Progress Notes (Signed)
Spoke with Ladonna Snide, Resident Care Coordinator at [facility]. They give pt his medications so I reviewed the pre-op instructions with her. I had spoken to the pt the other week when he was scheduled to have the surgery earlier this month. Ladonna Snide states nothing has changed with medical history and she will give pt these instructions.

## 2021-01-08 ENCOUNTER — Encounter (HOSPITAL_COMMUNITY): Payer: Self-pay | Admitting: Neurosurgery

## 2021-01-08 ENCOUNTER — Inpatient Hospital Stay (HOSPITAL_COMMUNITY): Payer: Medicare Other | Admitting: Anesthesiology

## 2021-01-08 ENCOUNTER — Inpatient Hospital Stay (HOSPITAL_COMMUNITY)
Admission: RE | Admit: 2021-01-08 | Discharge: 2021-01-09 | DRG: 519 | Disposition: A | Discharge status: Home Health | Payer: Medicare Other | Attending: Neurosurgery | Admitting: Neurosurgery

## 2021-01-08 ENCOUNTER — Inpatient Hospital Stay (HOSPITAL_COMMUNITY): Payer: Medicare Other

## 2021-01-08 ENCOUNTER — Other Ambulatory Visit: Payer: Self-pay

## 2021-01-08 ENCOUNTER — Encounter (HOSPITAL_COMMUNITY)
Admission: RE | Disposition: A | Discharge status: Home Health | Payer: Self-pay | Source: Home / Self Care | Attending: Neurosurgery

## 2021-01-08 DIAGNOSIS — R2689 Other abnormalities of gait and mobility: Secondary | ICD-10-CM | POA: Diagnosis present

## 2021-01-08 DIAGNOSIS — Z20822 Contact with and (suspected) exposure to covid-19: Secondary | ICD-10-CM | POA: Diagnosis present

## 2021-01-08 DIAGNOSIS — F17210 Nicotine dependence, cigarettes, uncomplicated: Secondary | ICD-10-CM | POA: Diagnosis present

## 2021-01-08 DIAGNOSIS — G992 Myelopathy in diseases classified elsewhere: Secondary | ICD-10-CM | POA: Diagnosis present

## 2021-01-08 DIAGNOSIS — J449 Chronic obstructive pulmonary disease, unspecified: Secondary | ICD-10-CM | POA: Diagnosis present

## 2021-01-08 DIAGNOSIS — Z79890 Hormone replacement therapy (postmenopausal): Secondary | ICD-10-CM | POA: Diagnosis not present

## 2021-01-08 DIAGNOSIS — Z79899 Other long term (current) drug therapy: Secondary | ICD-10-CM

## 2021-01-08 DIAGNOSIS — M4805 Spinal stenosis, thoracolumbar region: Secondary | ICD-10-CM | POA: Diagnosis present

## 2021-01-08 DIAGNOSIS — Z882 Allergy status to sulfonamides status: Secondary | ICD-10-CM | POA: Diagnosis not present

## 2021-01-08 DIAGNOSIS — M4804 Spinal stenosis, thoracic region: Secondary | ICD-10-CM | POA: Diagnosis present

## 2021-01-08 DIAGNOSIS — Z419 Encounter for procedure for purposes other than remedying health state, unspecified: Secondary | ICD-10-CM

## 2021-01-08 DIAGNOSIS — I10 Essential (primary) hypertension: Secondary | ICD-10-CM | POA: Diagnosis present

## 2021-01-08 HISTORY — PX: THORACIC DISCECTOMY: SHX6113

## 2021-01-08 LAB — CBC
HCT: 44.9 % (ref 39.0–52.0)
Hemoglobin: 15.1 g/dL (ref 13.0–17.0)
MCH: 31.7 pg (ref 26.0–34.0)
MCHC: 33.6 g/dL (ref 30.0–36.0)
MCV: 94.3 fL (ref 80.0–100.0)
Platelets: 225 10*3/uL (ref 150–400)
RBC: 4.76 MIL/uL (ref 4.22–5.81)
RDW: 13.8 % (ref 11.5–15.5)
WBC: 11.1 10*3/uL — ABNORMAL HIGH (ref 4.0–10.5)
nRBC: 0 % (ref 0.0–0.2)

## 2021-01-08 LAB — BASIC METABOLIC PANEL
Anion gap: 11 (ref 5–15)
BUN: 17 mg/dL (ref 8–23)
CO2: 19 mmol/L — ABNORMAL LOW (ref 22–32)
Calcium: 9 mg/dL (ref 8.9–10.3)
Chloride: 104 mmol/L (ref 98–111)
Creatinine, Ser: 1.05 mg/dL (ref 0.61–1.24)
GFR, Estimated: >60 mL/min (ref >60)
Glucose, Bld: 140 mg/dL — ABNORMAL HIGH (ref 70–99)
Potassium: 4.4 mmol/L (ref 3.5–5.1)
Sodium: 134 mmol/L — ABNORMAL LOW (ref 135–145)

## 2021-01-08 LAB — SURGICAL PCR SCREEN
MRSA, PCR: NEGATIVE
Staphylococcus aureus: NEGATIVE

## 2021-01-08 LAB — SARS CORONAVIRUS 2 BY RT PCR (HOSPITAL ORDER, PERFORMED IN ~~LOC~~ HOSPITAL LAB): SARS Coronavirus 2: NEGATIVE

## 2021-01-08 SURGERY — THORACIC DISCECTOMY
Anesthesia: General | Site: Spine Thoracic

## 2021-01-08 MED ORDER — LACTATED RINGERS IV SOLN: INTRAVENOUS | Status: DC

## 2021-01-08 MED ORDER — ONDANSETRON HCL 4 MG/2ML IJ SOLN
INTRAMUSCULAR | Status: AC
  Filled 2021-01-08: qty 2

## 2021-01-08 MED ORDER — CEFAZOLIN IN SODIUM CHLORIDE 3-0.9 GM/100ML-% IV SOLN
3.0000 g | INTRAVENOUS | Status: AC
  Administered 2021-01-08: 3 g via INTRAVENOUS

## 2021-01-08 MED ORDER — THROMBIN 5000 UNITS EX SOLR: OROMUCOSAL | Status: DC | PRN

## 2021-01-08 MED ORDER — ACETAMINOPHEN 650 MG RE SUPP: 650.0000 mg | RECTAL | Status: DC | PRN

## 2021-01-08 MED ORDER — VITAMIN D3 1.25 MG (50000 UT) PO TABS: 50000.0000 [IU] | ORAL_TABLET | ORAL | Status: DC

## 2021-01-08 MED ORDER — THROMBIN 5000 UNITS EX SOLR
CUTANEOUS | Status: AC
  Filled 2021-01-08: qty 5000

## 2021-01-08 MED ORDER — DIAZEPAM 2 MG PO TABS
2.0000 mg | ORAL_TABLET | Freq: Two times a day (BID) | ORAL | Status: DC | PRN
  Administered 2021-01-08: 2 mg via ORAL
  Filled 2021-01-08: qty 1

## 2021-01-08 MED ORDER — VITAMIN D 25 MCG (1000 UNIT) PO TABS
1000.0000 [IU] | ORAL_TABLET | Freq: Every day | ORAL | Status: DC
  Administered 2021-01-08 – 2021-01-09 (×2): 1000 [IU] via ORAL
  Filled 2021-01-08 (×2): qty 1

## 2021-01-08 MED ORDER — BUPIVACAINE HCL (PF) 0.25 % IJ SOLN
INTRAMUSCULAR | Status: DC | PRN
  Administered 2021-01-08: 10 mL
  Administered 2021-01-08: 20 mL

## 2021-01-08 MED ORDER — HYDROMORPHONE HCL 1 MG/ML IJ SOLN
0.2500 mg | INTRAMUSCULAR | Status: DC | PRN
  Administered 2021-01-08: 0.25 mg via INTRAVENOUS
  Administered 2021-01-08 (×2): 0.5 mg via INTRAVENOUS

## 2021-01-08 MED ORDER — SODIUM CHLORIDE 0.9% FLUSH: 3.0000 mL | Freq: Two times a day (BID) | INTRAVENOUS | Status: DC

## 2021-01-08 MED ORDER — HEMOSTATIC AGENTS (NO CHARGE) OPTIME
TOPICAL | Status: DC | PRN
  Administered 2021-01-08: 1 via TOPICAL

## 2021-01-08 MED ORDER — DEXAMETHASONE SODIUM PHOSPHATE 10 MG/ML IJ SOLN
INTRAMUSCULAR | Status: AC
  Filled 2021-01-08: qty 1

## 2021-01-08 MED ORDER — VITAMIN D (ERGOCALCIFEROL) 1.25 MG (50000 UNIT) PO CAPS
50000.0000 [IU] | ORAL_CAPSULE | ORAL | Status: DC
  Administered 2021-01-09: 50000 [IU] via ORAL
  Filled 2021-01-08: qty 1

## 2021-01-08 MED ORDER — GLYCOPYRROLATE 0.2 MG/ML IJ SOLN
INTRAMUSCULAR | Status: DC | PRN
  Administered 2021-01-08: .2 mg via INTRAVENOUS

## 2021-01-08 MED ORDER — MENTHOL 3 MG MT LOZG: 1.0000 | LOZENGE | OROMUCOSAL | Status: DC | PRN

## 2021-01-08 MED ORDER — CHLORHEXIDINE GLUCONATE 0.12 % MT SOLN
OROMUCOSAL | Status: AC
  Administered 2021-01-08: 15 mL via OROMUCOSAL
  Filled 2021-01-08: qty 15

## 2021-01-08 MED ORDER — LEVOTHYROXINE SODIUM 25 MCG PO TABS
125.0000 ug | ORAL_TABLET | Freq: Every day | ORAL | Status: DC
  Administered 2021-01-09: 125 ug via ORAL
  Filled 2021-01-08: qty 1

## 2021-01-08 MED ORDER — CEFAZOLIN SODIUM-DEXTROSE 2-4 GM/100ML-% IV SOLN
INTRAVENOUS | Status: AC
  Filled 2021-01-08: qty 100

## 2021-01-08 MED ORDER — VITAMIN B-12 1000 MCG PO TABS
1000.0000 ug | ORAL_TABLET | Freq: Every day | ORAL | Status: DC
  Administered 2021-01-08 – 2021-01-09 (×2): 1000 ug via ORAL
  Filled 2021-01-08 (×2): qty 1

## 2021-01-08 MED ORDER — BENAZEPRIL HCL 20 MG PO TABS
20.0000 mg | ORAL_TABLET | Freq: Every day | ORAL | Status: DC
  Administered 2021-01-08: 20 mg via ORAL
  Filled 2021-01-08 (×2): qty 1

## 2021-01-08 MED ORDER — CHLORHEXIDINE GLUCONATE CLOTH 2 % EX PADS: 6.0000 | MEDICATED_PAD | Freq: Once | CUTANEOUS | Status: DC

## 2021-01-08 MED ORDER — ALFUZOSIN HCL ER 10 MG PO TB24
10.0000 mg | ORAL_TABLET | Freq: Every day | ORAL | Status: DC
  Administered 2021-01-09: 10 mg via ORAL
  Filled 2021-01-08 (×2): qty 1

## 2021-01-08 MED ORDER — ONDANSETRON HCL 4 MG PO TABS: 4.0000 mg | ORAL_TABLET | Freq: Four times a day (QID) | ORAL | Status: DC | PRN

## 2021-01-08 MED ORDER — SODIUM CHLORIDE 0.9 % IV SOLN
250.0000 mL | INTRAVENOUS | Status: DC
  Administered 2021-01-08: 250 mL via INTRAVENOUS

## 2021-01-08 MED ORDER — BUPIVACAINE HCL (PF) 0.25 % IJ SOLN
INTRAMUSCULAR | Status: AC
  Filled 2021-01-08: qty 30

## 2021-01-08 MED ORDER — PHENYLEPHRINE HCL-NACL 20-0.9 MG/250ML-% IV SOLN
INTRAVENOUS | Status: DC | PRN
  Administered 2021-01-08: 30 ug/min via INTRAVENOUS

## 2021-01-08 MED ORDER — SUGAMMADEX SODIUM 200 MG/2ML IV SOLN
INTRAVENOUS | Status: DC | PRN
  Administered 2021-01-08: 400 mg via INTRAVENOUS

## 2021-01-08 MED ORDER — HYDROMORPHONE HCL 1 MG/ML IJ SOLN
1.0000 mg | INTRAMUSCULAR | Status: DC | PRN
Start: 2021-01-08 — End: 2021-01-09

## 2021-01-08 MED ORDER — EPHEDRINE SULFATE 50 MG/ML IJ SOLN
INTRAMUSCULAR | Status: DC | PRN
  Administered 2021-01-08 (×5): 5 mg via INTRAVENOUS

## 2021-01-08 MED ORDER — PHENYLEPHRINE 40 MCG/ML (10ML) SYRINGE FOR IV PUSH (FOR BLOOD PRESSURE SUPPORT)
PREFILLED_SYRINGE | INTRAVENOUS | Status: DC | PRN
  Administered 2021-01-08: 120 ug via INTRAVENOUS
  Administered 2021-01-08: 160 ug via INTRAVENOUS

## 2021-01-08 MED ORDER — PROPOFOL 10 MG/ML IV BOLUS
INTRAVENOUS | Status: DC | PRN
  Administered 2021-01-08: 50 mg via INTRAVENOUS
  Administered 2021-01-08: 150 mg via INTRAVENOUS

## 2021-01-08 MED ORDER — KETOROLAC TROMETHAMINE 15 MG/ML IJ SOLN
30.0000 mg | Freq: Four times a day (QID) | INTRAMUSCULAR | Status: AC
  Administered 2021-01-08 – 2021-01-09 (×3): 30 mg via INTRAVENOUS
  Filled 2021-01-08 (×3): qty 2

## 2021-01-08 MED ORDER — ORAL CARE MOUTH RINSE: 15.0000 mL | Freq: Once | OROMUCOSAL | Status: AC

## 2021-01-08 MED ORDER — CHLORHEXIDINE GLUCONATE 0.12 % MT SOLN: 15.0000 mL | Freq: Once | OROMUCOSAL | Status: AC

## 2021-01-08 MED ORDER — EPHEDRINE 5 MG/ML INJ
INTRAVENOUS | Status: AC
  Filled 2021-01-08: qty 5

## 2021-01-08 MED ORDER — BACITRACIN ZINC 500 UNIT/GM EX OINT
TOPICAL_OINTMENT | CUTANEOUS | Status: AC
  Filled 2021-01-08: qty 28.35

## 2021-01-08 MED ORDER — CEFAZOLIN SODIUM 1 G IJ SOLR
INTRAMUSCULAR | Status: AC
  Filled 2021-01-08: qty 10

## 2021-01-08 MED ORDER — DEXAMETHASONE SODIUM PHOSPHATE 10 MG/ML IJ SOLN
10.0000 mg | Freq: Once | INTRAMUSCULAR | Status: AC
  Administered 2021-01-08: 10 mg via INTRAVENOUS

## 2021-01-08 MED ORDER — PROPOFOL 10 MG/ML IV BOLUS
INTRAVENOUS | Status: AC
  Filled 2021-01-08: qty 20

## 2021-01-08 MED ORDER — FENTANYL CITRATE (PF) 250 MCG/5ML IJ SOLN
INTRAMUSCULAR | Status: AC
  Filled 2021-01-08: qty 5

## 2021-01-08 MED ORDER — PHENYLEPHRINE 40 MCG/ML (10ML) SYRINGE FOR IV PUSH (FOR BLOOD PRESSURE SUPPORT)
PREFILLED_SYRINGE | INTRAVENOUS | Status: AC
  Filled 2021-01-08: qty 10

## 2021-01-08 MED ORDER — GLYCOPYRROLATE PF 0.2 MG/ML IJ SOSY
PREFILLED_SYRINGE | INTRAMUSCULAR | Status: AC
  Filled 2021-01-08: qty 1

## 2021-01-08 MED ORDER — HYDROCODONE-ACETAMINOPHEN 10-325 MG PO TABS
1.0000 | ORAL_TABLET | ORAL | Status: DC | PRN
  Administered 2021-01-08 – 2021-01-09 (×3): 2 via ORAL
  Filled 2021-01-08 (×3): qty 2

## 2021-01-08 MED ORDER — DULOXETINE HCL 30 MG PO CPEP
60.0000 mg | ORAL_CAPSULE | Freq: Every day | ORAL | Status: DC
  Administered 2021-01-08: 60 mg via ORAL
  Filled 2021-01-08: qty 2

## 2021-01-08 MED ORDER — 0.9 % SODIUM CHLORIDE (POUR BTL) OPTIME
TOPICAL | Status: DC | PRN
  Administered 2021-01-08: 1000 mL

## 2021-01-08 MED ORDER — LIDOCAINE 2% (20 MG/ML) 5 ML SYRINGE
INTRAMUSCULAR | Status: DC | PRN
  Administered 2021-01-08: 60 mg via INTRAVENOUS

## 2021-01-08 MED ORDER — CYCLOBENZAPRINE HCL 10 MG PO TABS
10.0000 mg | ORAL_TABLET | Freq: Three times a day (TID) | ORAL | Status: DC | PRN
  Administered 2021-01-09: 10 mg via ORAL
  Filled 2021-01-08: qty 1

## 2021-01-08 MED ORDER — FENTANYL CITRATE (PF) 250 MCG/5ML IJ SOLN
INTRAMUSCULAR | Status: DC | PRN
  Administered 2021-01-08: 100 ug via INTRAVENOUS
  Administered 2021-01-08 (×3): 50 ug via INTRAVENOUS

## 2021-01-08 MED ORDER — LOPERAMIDE HCL 2 MG PO CAPS
2.0000 mg | ORAL_CAPSULE | ORAL | Status: DC | PRN
  Filled 2021-01-08: qty 1

## 2021-01-08 MED ORDER — HYDROMORPHONE HCL 1 MG/ML IJ SOLN
INTRAMUSCULAR | Status: AC
  Filled 2021-01-08: qty 1

## 2021-01-08 MED ORDER — PHENOL 1.4 % MT LIQD: 1.0000 | OROMUCOSAL | Status: DC | PRN

## 2021-01-08 MED ORDER — CEFAZOLIN SODIUM-DEXTROSE 1-4 GM/50ML-% IV SOLN
1.0000 g | Freq: Three times a day (TID) | INTRAVENOUS | Status: AC
  Administered 2021-01-08 – 2021-01-09 (×2): 1 g via INTRAVENOUS
  Filled 2021-01-08 (×2): qty 50

## 2021-01-08 MED ORDER — ROCURONIUM BROMIDE 10 MG/ML (PF) SYRINGE
PREFILLED_SYRINGE | INTRAVENOUS | Status: DC | PRN
  Administered 2021-01-08: 60 mg via INTRAVENOUS
  Administered 2021-01-08: 30 mg via INTRAVENOUS
  Administered 2021-01-08: 10 mg via INTRAVENOUS

## 2021-01-08 MED ORDER — ALBUMIN HUMAN 5 % IV SOLN: INTRAVENOUS | Status: DC | PRN

## 2021-01-08 MED ORDER — ACETAMINOPHEN 325 MG PO TABS: 650.0000 mg | ORAL_TABLET | ORAL | Status: DC | PRN

## 2021-01-08 MED ORDER — ONDANSETRON HCL 4 MG/2ML IJ SOLN: 4.0000 mg | Freq: Once | INTRAMUSCULAR | Status: DC | PRN

## 2021-01-08 MED ORDER — ONDANSETRON HCL 4 MG/2ML IJ SOLN: 4.0000 mg | Freq: Four times a day (QID) | INTRAMUSCULAR | Status: DC | PRN

## 2021-01-08 MED ORDER — ONDANSETRON HCL 4 MG/2ML IJ SOLN
INTRAMUSCULAR | Status: DC | PRN
  Administered 2021-01-08: 4 mg via INTRAVENOUS

## 2021-01-08 MED ORDER — THROMBIN 5000 UNITS EX SOLR
CUTANEOUS | Status: DC | PRN
  Administered 2021-01-08 (×2): 5000 [IU] via TOPICAL

## 2021-01-08 MED ORDER — SODIUM CHLORIDE 0.9% FLUSH: 3.0000 mL | INTRAVENOUS | Status: DC | PRN

## 2021-01-08 MED ORDER — HYDROCODONE-ACETAMINOPHEN 5-325 MG PO TABS
1.0000 | ORAL_TABLET | ORAL | Status: DC | PRN
Start: 2021-01-08 — End: 2021-01-09

## 2021-01-08 MED ORDER — THROMBIN 5000 UNITS EX SOLR
CUTANEOUS | Status: AC
  Filled 2021-01-08: qty 10000

## 2021-01-08 SURGICAL SUPPLY — 54 items
ADH SKN CLS APL DERMABOND .7 (GAUZE/BANDAGES/DRESSINGS) ×1
APL SKNCLS STERI-STRIP NONHPOA (GAUZE/BANDAGES/DRESSINGS) ×1
BAG COUNTER SPONGE SURGICOUNT (BAG) ×2 IMPLANT
BAG SPNG CNTER NS LX DISP (BAG) ×1
BAND INSRT 18 STRL LF DISP RB (MISCELLANEOUS) ×2
BAND RUBBER #18 3X1/16 STRL (MISCELLANEOUS) ×4 IMPLANT
BENZOIN TINCTURE PRP APPL 2/3 (GAUZE/BANDAGES/DRESSINGS) ×2 IMPLANT
BUR CUTTER 7.0 ROUND (BURR) ×2 IMPLANT
BUR MATCHSTICK NEURO 3.0 LAGG (BURR) ×2 IMPLANT
CANISTER SUCT 3000ML PPV (MISCELLANEOUS) ×2 IMPLANT
CARTRIDGE OIL MAESTRO DRILL (MISCELLANEOUS) ×1 IMPLANT
DERMABOND ADVANCED (GAUZE/BANDAGES/DRESSINGS) ×1
DERMABOND ADVANCED .7 DNX12 (GAUZE/BANDAGES/DRESSINGS) ×1 IMPLANT
DIFFUSER DRILL AIR PNEUMATIC (MISCELLANEOUS) ×2 IMPLANT
DRAPE HALF SHEET 40X57 (DRAPES) ×2 IMPLANT
DRAPE LAPAROTOMY 100X72X124 (DRAPES) ×2 IMPLANT
DRAPE MICROSCOPE LEICA (MISCELLANEOUS) ×2 IMPLANT
DRSG OPSITE POSTOP 4X6 (GAUZE/BANDAGES/DRESSINGS) ×2 IMPLANT
DRSG OPSITE POSTOP 4X8 (GAUZE/BANDAGES/DRESSINGS) ×2 IMPLANT
ELECT REM PT RETURN 9FT ADLT (ELECTROSURGICAL) ×2
ELECTRODE REM PT RTRN 9FT ADLT (ELECTROSURGICAL) ×1 IMPLANT
EVACUATOR 1/8 PVC DRAIN (DRAIN) ×2 IMPLANT
GAUZE 4X4 16PLY ~~LOC~~+RFID DBL (SPONGE) ×4 IMPLANT
GAUZE SPONGE 4X4 12PLY STRL (GAUZE/BANDAGES/DRESSINGS) IMPLANT
GLOVE ECLIPSE 9.0 STRL (GLOVE) ×2 IMPLANT
GLOVE ORTHOPEDIC STR SZ6.5 (GLOVE) ×4 IMPLANT
GLOVE SS BIOGEL STRL SZ 6.5 (GLOVE) ×2 IMPLANT
GLOVE SUPERSENSE BIOGEL SZ 6.5 (GLOVE) ×2
GLOVE SURG ENC MOIS LTX SZ6.5 (GLOVE) ×2 IMPLANT
GLOVE SURG LTX SZ9 (GLOVE) ×2 IMPLANT
GLOVE SURG PR MICRO ENCORE 7.5 (GLOVE) ×4 IMPLANT
GLOVE SURG SYN 6.5 ES PF (GLOVE) ×4 IMPLANT
GLOVE SURG UNDER POLY LF SZ6.5 (GLOVE) ×2 IMPLANT
GOWN STRL REUS W/ TWL LRG LVL3 (GOWN DISPOSABLE) ×2 IMPLANT
GOWN STRL REUS W/ TWL XL LVL3 (GOWN DISPOSABLE) ×2 IMPLANT
GOWN STRL REUS W/TWL 2XL LVL3 (GOWN DISPOSABLE) IMPLANT
GOWN STRL REUS W/TWL LRG LVL3 (GOWN DISPOSABLE) ×4
GOWN STRL REUS W/TWL XL LVL3 (GOWN DISPOSABLE) ×4
HEMOSTAT POWDER KIT SURGIFOAM (HEMOSTASIS) ×6 IMPLANT
KIT BASIN OR (CUSTOM PROCEDURE TRAY) ×2 IMPLANT
KIT TURNOVER KIT B (KITS) ×2 IMPLANT
NEEDLE HYPO 22GX1.5 SAFETY (NEEDLE) ×2 IMPLANT
NEEDLE SPNL 22GX3.5 QUINCKE BK (NEEDLE) ×2 IMPLANT
NS IRRIG 1000ML POUR BTL (IV SOLUTION) ×2 IMPLANT
OIL CARTRIDGE MAESTRO DRILL (MISCELLANEOUS) ×2
PACK LAMINECTOMY NEURO (CUSTOM PROCEDURE TRAY) ×2 IMPLANT
SPONGE SURGIFOAM ABS GEL SZ50 (HEMOSTASIS) ×2 IMPLANT
STAPLER VISISTAT 35W (STAPLE) ×2 IMPLANT
STRIP CLOSURE SKIN 1/2X4 (GAUZE/BANDAGES/DRESSINGS) ×2 IMPLANT
SUT VIC AB 2-0 CT1 18 (SUTURE) ×6 IMPLANT
SUT VIC AB 3-0 SH 8-18 (SUTURE) ×6 IMPLANT
TOWEL GREEN STERILE (TOWEL DISPOSABLE) ×2 IMPLANT
TOWEL GREEN STERILE FF (TOWEL DISPOSABLE) ×2 IMPLANT
WATER STERILE IRR 1000ML POUR (IV SOLUTION) ×2 IMPLANT

## 2021-01-08 NOTE — Transfer of Care (Signed)
Immediate Anesthesia Transfer of Care Note  Patient: [name]  Procedure(s) Performed: Thoracic One-Two, Thoracic Two-Three, Thoracic Eleven-Twelve, Thoracic Twelve-Lumbar One Laminectomy (Spine Thoracic)  Patient Location: PACU  Anesthesia Type:General  Level of Consciousness: awake and alert   Airway & Oxygen Therapy: Patient Spontanous Breathing and Patient connected to face mask oxygen  Post-op Assessment: Report given to RN and Post -op Vital signs reviewed and stable  Post vital signs: Reviewed and stable  Last Vitals:  Vitals Value Taken Time  BP 120/62 01/08/21 1413  Temp    Pulse 80 01/08/21 1417  Resp 12 01/08/21 1417  SpO2 100 % 01/08/21 1417  Vitals shown include unvalidated device data.  Last Pain:  Vitals:   01/08/21 0702  TempSrc:   PainSc: 7          Complications: No notable events documented.

## 2021-01-08 NOTE — H&P (Signed)
[name] is an 75 y.o. male.   Chief Complaint: Weakness HPI: 75 year old male status post extensive cervical and lumbar surgery.  Patient presents with progressive numbness into the ulnar aspects of his upper extremities with some weakness involving fine motor movements of his hands.  Patient also notes marked increased weakness in both lower extremities with difficulty with proprioception and gait.  He denies any bowel or bladder dysfunction.  Work-up demonstrates evidence of critical stenosis at T1-T3 and T11-12.  Patient presents now for decompressive surgery in hopes of improving his symptoms.  Past Medical History:  Diagnosis Date   Arthritis    Bronchitis    hx of   COPD (chronic obstructive pulmonary disease) (HCC)    GERD (gastroesophageal reflux disease)    History of kidney stones    Hypertension    Hypothyroidism    Macular degeneration    right eye   Restless leg syndrome     Past Surgical History:  Procedure Laterality Date   BACK SURGERY     Fusion 2005   Graves Disease     JOINT REPLACEMENT  1970   Right knee    NECK SURGERY     POSTERIOR CERVICAL FUSION/FORAMINOTOMY  12/05/2011   Procedure: POSTERIOR CERVICAL FUSION/FORAMINOTOMY LEVEL 4;  Surgeon: Reinaldo Meeker, MD;  Location: [facility];  Service: Neurosurgery;  Laterality: N/A;  Posterior Cervical fixation Cervical four-Seven,Cervical Decompresson Cervical three    Family History  Problem Relation Age of Onset   Heart attack Mother 21   Heart disease Father    Social History:  reports that he has been smoking cigarettes. He has never used smokeless tobacco. He reports that he does not drink alcohol and does not use drugs.  Allergies:  Allergies  Allergen Reactions   Sulfa Antibiotics Other (See Comments)    Causes blisters and skin to peel from inside mouth    Medications Prior to Admission  Medication Sig Dispense Refill   alfuzosin (UROXATRAL) 10 MG 24 hr tablet Take 1 tablet (10 mg  total) by mouth daily with breakfast. 90 tablet 3   benazepril (LOTENSIN) 20 MG tablet Take 20 mg by mouth at bedtime.      Cholecalciferol (VITAMIN D3) 1.25 MG (50000 UT) TABS Take 50,000 Units by mouth every 7 (seven) days.     cholecalciferol (VITAMIN D3) 25 MCG (1000 UNIT) tablet Take 1,000 Units by mouth daily.     DULoxetine (CYMBALTA) 60 MG capsule Take 60 mg by mouth at bedtime.      levothyroxine (SYNTHROID) 125 MCG tablet Take 125 mcg by mouth daily before breakfast.     vitamin B-12 (CYANOCOBALAMIN) 1000 MCG tablet Take 1,000 mcg by mouth daily.     diazepam (VALIUM) 2 MG tablet Take 2 mg by mouth every 12 (twelve) hours as needed for muscle spasms.     loperamide (IMODIUM) 2 MG capsule Take 2 mg by mouth as needed for diarrhea or loose stools. One capsule with each loose stool - max of 5 doses in 24 hours.      Results for orders placed or performed during the hospital encounter of 01/08/21 (from the past 48 hour(s))  SARS Coronavirus 2 by RT PCR (hospital order, performed in [facility] hospital lab) Nasopharyngeal Nasopharyngeal Swab     Status: None   Collection Time: 01/08/21  6:39 AM   Specimen: Nasopharyngeal Swab  Result Value Ref Range   SARS Coronavirus 2 NEGATIVE NEGATIVE    Comment: (NOTE) SARS-CoV-2  target nucleic acids are NOT DETECTED.  The SARS-CoV-2 RNA is generally detectable in upper and lower respiratory specimens during the acute phase of infection. The lowest concentration of SARS-CoV-2 viral copies this assay can detect is 250 copies / mL. A negative result does not preclude SARS-CoV-2 infection and should not be used as the sole basis for treatment or other patient management decisions.  A negative result may occur with improper specimen collection / handling, submission of specimen other than nasopharyngeal swab, presence of viral mutation(s) within the areas targeted by this assay, and inadequate number of viral copies (<250 copies / mL). A negative  result must be combined with clinical observations, patient history, and epidemiological information.  Fact Sheet for Patients:   BoilerBrush.com.cy  Fact Sheet for Healthcare Providers: https://pope.com/  This test is not yet approved or  cleared by the [place] FDA and has been authorized for detection and/or diagnosis of SARS-CoV-2 by FDA under an Emergency Use Authorization (EUA).  This EUA will remain in effect (meaning this test can be used) for the duration of the COVID-19 declaration under Section 564(b)(1) of the Act, 21 U.S.C. section 360bbb-3(b)(1), unless the authorization is terminated or revoked sooner.  Performed at [facility], [address], [place], [place] [address]   Surgical pcr screen     Status: None   Collection Time: 01/08/21  6:47 AM   Specimen: Nasal Mucosa; Nasal Swab  Result Value Ref Range   MRSA, PCR NEGATIVE NEGATIVE   Staphylococcus aureus NEGATIVE NEGATIVE    Comment: (NOTE) The Xpert SA Assay (FDA approved for NASAL specimens in patients 72 years of age and older), is one component of a comprehensive surveillance program. It is not intended to diagnose infection nor to guide or monitor treatment. Performed at [facility], [address], [place], [place] [address]   Basic metabolic panel per protocol     Status: Abnormal   Collection Time: 01/08/21  7:23 AM  Result Value Ref Range   Sodium 134 (L) 135 - 145 mmol/L   Potassium 4.4 3.5 - 5.1 mmol/L   Chloride 104 98 - 111 mmol/L   CO2 19 (L) 22 - 32 mmol/L   Glucose, Bld 140 (H) 70 - 99 mg/dL    Comment: Glucose reference range applies only to samples taken after fasting for at least 8 hours.   BUN 17 8 - 23 mg/dL   Creatinine, Ser 2.95 0.61 - 1.24 mg/dL   Calcium 9.0 8.9 - 18.8 mg/dL   GFR, Estimated >41 >66 mL/min    Comment: (NOTE) Calculated using the CKD-EPI Creatinine Equation (2021)    Anion gap 11 5 - 15     Comment: Performed at [facility], [address], [place], [place] [address]  CBC per protocol     Status: Abnormal   Collection Time: 01/08/21  7:23 AM  Result Value Ref Range   WBC 11.1 (H) 4.0 - 10.5 K/uL   RBC 4.76 4.22 - 5.81 MIL/uL   Hemoglobin 15.1 13.0 - 17.0 g/dL   HCT 60.1 09.3 - 23.5 %   MCV 94.3 80.0 - 100.0 fL   MCH 31.7 26.0 - 34.0 pg   MCHC 33.6 30.0 - 36.0 g/dL   RDW 57.3 22.0 - 25.4 %   Platelets 225 150 - 400 K/uL   nRBC 0.0 0.0 - 0.2 %    Comment: Performed at [facility], [address], [place], [place] [address]  No results found.  Pertinent items noted in HPI and remainder of comprehensive ROS otherwise negative.  Blood pressure 128/90, pulse 98, temperature 97.7 F (36.5 C), temperature source Oral, resp. rate 18, height 6\' 7"  (2.007 m), weight 121.6 kg, SpO2 100 %.  Patient is awake and alert.  He is oriented and appropriate.  Speech is fluent.  Judgment insight are intact.  Cranial nerve function normal lateral motor examination reveals some mild weakness of intrinsic function in both hands.  Patient with marked spastic weakness in both lower extremities.  Gait is unsteady.  Muscle tone is spastic.  Sensory examination with a moderately dense sensory level from his upper thoracic region distally.  Examination head ears eyes nose throat is unremarked.  Chest and abdomen are benign.  Extremities are free of major deformity. 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  Patient presents now for decompressive laminectomy in hopes of improving his symptoms.  Risks and benefits been explained.  Patient wishes to proceed.  Jordain Radin 01/08/2021, 9:49 AM

## 2021-01-08 NOTE — Op Note (Signed)
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  Asst. Surgeon: Doran Durand, NP  Anesthesia: General  Indication: 75 year old male with progressive bilateral lower extremity sensory loss and weakness with gait instability.  Work-up demonstrates evidence of critical multifactorial stenosis at T1-2 and T2-3.  Patient also with very severe stenosis at T11-T12.  Further review demonstrates severe stenosis at T12-L1.  Patient presents now for decompression at all these levels in hopes of improving his symptoms.  Operative note: After induction of anesthesia, patient position prone onto bolsters with his head fixed in [place] pin headrest.  Patient's upper thoracic region was prepped and draped sterilely as was his lower thoracic region.  Incision was made from T1-T3.  Dissection was performed bilaterally.  Retractor placed.  Level was confirmed based on bony landmarks as the patient's C7 spinous processes been removed previously.  Spinous processes and lamina were then removed of T1-T2 and the superior aspect of T3 using [place] rongeurs, care centers and high-speed drill.  Ligament flavum elevated and resected.  Lateral decompression performed undercutting the facet joints using [place] rongeurs.  There was no evidence of injury to thecal sac nerve roots or spinal cord.  Very good decompression was achieved at all levels.  Wound was irrigated.  Gelfoam was placed topically hemostasis.  A medium Hemovac drain was left deep wound space.  Wounds then closed in layers with Vicryl sutures.  Steri-Strips and sterile dressing were applied.  No apparent complications.  Attention was then placed to the lower thoracic region.   Incision made from T11-L1.  Dissection performed bilaterally.  Retractor placed.  Fluoroscopy used.  Levels confirmed.  Decompressive laminectomy was then performed to [employer], Kerrison rongeurs and the high-speed drill to remove the entire lamina of T11, T12 and the superior aspect of the lamina of L1.  Wound flavum elevated and resected.  Lateral decompression then performed undercutting the facet joints of T11-12 and T12-L1.  Very good decompression was achieved.  There was no evidence of injury to the thecal sacs, nerve roots or spinal cord.  Wound is then irrigated.  Gelfoam was placed operative hemostasis.  Wounds and closed in layers with Vicryl sutures.  A medium Hemovac drain was also left in the deep wound space.  Steri-Strips and sterile dressing were applied.  No apparent complications.  Patient tolerated the procedure well and he returns to the recovery room postop

## 2021-01-08 NOTE — Anesthesia Procedure Notes (Signed)
Procedure Name: Intubation Date/Time: 01/08/2021 10:35 AM Performed by: Epifanio Lesches, CRNA Pre-anesthesia Checklist: Patient identified, Emergency Drugs available, Suction available and Patient being monitored Patient Re-evaluated:Patient Re-evaluated prior to induction Oxygen Delivery Method: Circle System Utilized Preoxygenation: Pre-oxygenation with 100% oxygen Induction Type: IV induction Ventilation: Mask ventilation without difficulty Laryngoscope Size: Glidescope and 4 Grade View: Grade I Tube type: Oral Tube size: 7.5 mm Number of attempts: 1 Airway Equipment and Method: Stylet and Oral airway Placement Confirmation: ETT inserted through vocal cords under direct vision, positive ETCO2 and breath sounds checked- equal and bilateral Secured at: 23 cm Tube secured with: Tape Dental Injury: Teeth and Oropharynx as per pre-operative assessment

## 2021-01-08 NOTE — Anesthesia Postprocedure Evaluation (Signed)
Anesthesia Post Note  Patient: [name]  Procedure(s) Performed: Thoracic One-Two, Thoracic Two-Three, Thoracic Eleven-Twelve, Thoracic Twelve-Lumbar One Laminectomy (Spine Thoracic)     Patient location during evaluation: PACU Anesthesia Type: General Level of consciousness: awake and alert and oriented Pain management: pain level controlled Vital Signs Assessment: post-procedure vital signs reviewed and stable Respiratory status: spontaneous breathing, nonlabored ventilation, respiratory function stable and patient connected to nasal cannula oxygen Cardiovascular status: blood pressure returned to baseline and stable Postop Assessment: no apparent nausea or vomiting Anesthetic complications: no   No notable events documented.  Last Vitals:  Vitals:   01/08/21 1500 01/08/21 1515  BP: 105/60 (!) 101/58  Pulse: 73 69  Resp: 13 12  Temp:  (!) 36.1 C  SpO2: 94% 98%    Last Pain:  Vitals:   01/08/21 1515  TempSrc:   PainSc: Asleep    LLE Motor Response: Purposeful movement (01/08/21 1515)   RLE Motor Response: Purposeful movement (01/08/21 1515)        Maricia Scotti A.

## 2021-01-08 NOTE — Brief Op Note (Signed)
01/08/2021  1:55 PM  PATIENT:  [name]  75 y.o. male  PRE-OPERATIVE DIAGNOSIS:  Myelopathy  POST-OPERATIVE DIAGNOSIS:  Myelopathy  PROCEDURE:  Procedure(s): Thoracic One-Two, Thoracic Two-Three, Thoracic Eleven-Twelve, Thoracic Twelve-Lumbar One Laminectomy (N/A)  SURGEON:  Surgeon(s) and Role:    * Julio Sicks, MD - Primary  PHYSICIAN ASSISTANT:   ASSISTANTSMarland Mcalpine   ANESTHESIA:   general  EBL:  950 mL   BLOOD ADMINISTERED:none  DRAINS: (med) Hemovact drain(s) in the epidural space with  Suction Open   LOCAL MEDICATIONS USED:  MARCAINE     SPECIMEN:  No Specimen  DISPOSITION OF SPECIMEN:  N/A  COUNTS:  YES  TOURNIQUET:  * No tourniquets in log *  DICTATION: .Dragon Dictation  PLAN OF CARE: Admit to inpatient   PATIENT DISPOSITION:  PACU - hemodynamically stable.   Delay start of Pharmacological VTE agent (>24hrs) due to surgical blood loss or risk of bleeding: yes

## 2021-01-08 NOTE — Anesthesia Preprocedure Evaluation (Addendum)
Anesthesia Evaluation  Patient identified by MRN, date of birth, ID band Patient awake    Reviewed: Allergy & Precautions, NPO status , Patient's Chart, lab work & pertinent test results, reviewed documented beta blocker date and time   Airway Mallampati: II  TM Distance: >3 FB Neck ROM: Full    Dental  (+) Edentulous Upper, Edentulous Lower   Pulmonary COPD,  COPD inhaler, Current Smoker and Patient abstained from smoking.,    Pulmonary exam normal breath sounds clear to auscultation       Cardiovascular hypertension, Pt. on medications Normal cardiovascular exam Rhythm:Regular Rate:Normal  Echo 01/31/19 1. Left ventricular ejection fraction, by visual estimation, is 60 to 65%. The left ventricle has normal function. Normal left ventricular size. There is mildly increased left ventricular hypertrophy.  2. Left ventricular diastolic Doppler parameters are consistent with impaired relaxation pattern of LV diastolic filling.  3. Global right ventricle has normal systolic function.The right ventricular size is normal. No increase in right ventricular wall thickness.  4. Left atrial size was mildly dilated.  5. Right atrial size was normal.  6. The mitral valve is normal in structure. No evidence of mitral valve regurgitation. No evidence of mitral stenosis.  7. The tricuspid valve is normal in structure. Tricuspid valve regurgitation was not visualized by color flow Doppler.  8. The aortic valve was not well visualized Aortic valve regurgitation was not visualized by color flow Doppler. Structurally normal aortic valve, with no evidence of sclerosis or stenosis.  9. The pulmonic valve was normal in structure. Pulmonic valve regurgitation is not visualized by color flow Doppler.  10. TR signal is inadequate for assessing pulmonary artery systolic pressure.  11. The inferior vena cava is normal in size with greater than 50%  respiratory variability, suggesting right atrial pressure of 3 mmHg.   Neuro/Psych Thoracic myelopathy negative psych ROS   GI/Hepatic Neg liver ROS, GERD  ,  Endo/Other  Hypothyroidism Obesity  Renal/GU Renal diseaseHx/o renal calculi  negative genitourinary   Musculoskeletal  (+) Arthritis , Osteoarthritis,  Hx/o cervical fusion   Abdominal (+) + obese,   Peds  Hematology   Anesthesia Other Findings   Reproductive/Obstetrics                            Anesthesia Physical Anesthesia Plan  ASA: 3  Anesthesia Plan: General   Post-op Pain Management:    Induction: Intravenous  PONV Risk Score and Plan: Treatment may vary due to age or medical condition and Ondansetron  Airway Management Planned: Oral ETT and Video Laryngoscope Planned  Additional Equipment:   Intra-op Plan:   Post-operative Plan: Extubation in OR  Informed Consent: I have reviewed the patients History and Physical, chart, labs and discussed the procedure including the risks, benefits and alternatives for the proposed anesthesia with the patient or authorized representative who has indicated his/her understanding and acceptance.     Dental advisory given  Plan Discussed with: CRNA and Anesthesiologist  Anesthesia Plan Comments:         Anesthesia Quick Evaluation

## 2021-01-09 ENCOUNTER — Encounter (HOSPITAL_COMMUNITY): Payer: Self-pay | Admitting: Neurosurgery

## 2021-01-09 MED ORDER — HYDROCODONE-ACETAMINOPHEN 10-325 MG PO TABS: 1.0000 | ORAL_TABLET | ORAL | 0 refills | Status: DC | PRN

## 2021-01-09 MED ORDER — DOCUSATE SODIUM 100 MG PO CAPS: 100.0000 mg | ORAL_CAPSULE | Freq: Two times a day (BID) | ORAL | 2 refills | Status: DC | PRN

## 2021-01-09 MED FILL — Thrombin For Soln 5000 Unit: CUTANEOUS | Qty: 5000 | Status: AC

## 2021-01-09 NOTE — Progress Notes (Signed)
Pt recommended for [facility], CSW informed by RN that pt is at [facility] in [place], RN just received call from [place] at [facility] that PT/OT can happen in house.  CSW spoke with pt in room who confirmed that he lives at [facility], aware that he will do PT after discharge.  CSW spoke with [place] at [facility] who requests HH orders, DC summary, PT note be faxed to [phone], which was done. Daleen Squibb, MSW, LCSW 9/27/202211:57 AM

## 2021-01-09 NOTE — Progress Notes (Signed)
Patient being discharged to his home.  Patient to be transported by receiving facility.  IV removed with the catheter intact.  Discharge instructions and prescription information placed in the packet for discharge. Patient discharged with all belongings.

## 2021-01-09 NOTE — Evaluation (Signed)
Physical Therapy Evaluation Patient Details Name: [name] MRN: [id number] DOB: [birth year] Today's Date: 01/09/2021  History of Present Illness  Pt is a 75 y.o. male s/p T1-2, 2-3, 11-12, T12-L1 fusion 9/26. Pt with significant PMH for back surgery, cervical fusion (2013), graves disease, macular degeneration, restless leg, and COPD.  Clinical Impression  PTA, pt lives in an ALF, is a limited community ambulator with a Rollator and uses adaptive equipment for ADL's. Pt presents with decreased functional mobility secondary to generalized weakness, balance deficits, pain, and decreased activity tolerance. Pt performing bed mobility at a supervision level. Once sitting edge of bed, pt requesting to lie back down due to fatigue/dizziness. BP 123/62. He did walk previously room distances using a Rollator with OT. Education provided regarding spinal precautions and activity recommendations. Recommend HHPT at ALF to address deficits and maximize functional mobility.      Recommendations for follow up therapy are one component of a multi-disciplinary discharge planning process, led by the attending physician.  Recommendations may be updated based on patient status, additional functional criteria and insurance authorization.  Follow Up Recommendations Home health PT;Supervision for mobility/OOB    Equipment Recommendations  None recommended by PT    Recommendations for Other Services       Precautions / Restrictions Precautions Precautions: Back Precaution Booklet Issued: Yes (comment) Precaution Comments: Providing handout and education on cback precautions Required Braces or Orthoses: Other Brace Other Brace: No brace per MD order Restrictions Weight Bearing Restrictions: No      Mobility  Bed Mobility Overal bed mobility: Needs Assistance Bed Mobility: Rolling;Sidelying to Sit;Sit to Sidelying Rolling: Supervision Sidelying to sit: Supervision     Sit to sidelying:  Supervision General bed mobility comments: Cues for log roll technique, HOB elevated use of rail    Transfers Overall transfer level: Needs assistance Equipment used: 4-wheeled walker Transfers: Sit to/from Stand Sit to Stand: Min assist;+2 safety/equipment         General transfer comment: Pt requesting to lie back down to fatigue/dizziness  Ambulation/Gait                Stairs            Wheelchair Mobility    Modified Rankin (Stroke Patients Only)       Balance Overall balance assessment: Needs assistance Sitting-balance support: Feet supported Sitting balance-Leahy Scale: Fair Sitting balance - Comments: Good initially, decreasing to poor after ~8 minutes EOB due to fatigue   Standing balance support: Bilateral upper extremity supported Standing balance-Leahy Scale: Poor Standing balance comment: Pt required min guard-min A throughout functional mobility with Rollator, with one small LOB that he self corrected.                             Pertinent Vitals/Pain Pain Assessment: Faces Faces Pain Scale: Hurts little more Pain Location: surgical site Pain Descriptors / Indicators: Operative site guarding Pain Intervention(s): Limited activity within patient's tolerance;Monitored during session    Home Living Family/patient expects to be discharged to:: Private residence Living Arrangements: Spouse/significant other Available Help at Discharge: Family;Personal care attendant Type of Home: Assisted living Home Access: Level entry     Home Layout: One level Home Equipment: Walker - 4 wheels;Shower seat;Walker - 2 wheels Additional Comments: Pt lives in ALF with wife.    Prior Function Level of Independence: Independent with assistive device(s)         Comments: Uses AE for  LB dressing (including a reacher). Using a rollator for functional mobility. Reports he drives (goes the resturants). Wife completes simple meal prep for him.      Hand Dominance        Extremity/Trunk Assessment   Upper Extremity Assessment Upper Extremity Assessment: Defer to OT evaluation    Lower Extremity Assessment Lower Extremity Assessment: Generalized weakness    Cervical / Trunk Assessment Cervical / Trunk Assessment: Kyphotic  Communication   Communication: No difficulties  Cognition Arousal/Alertness: Awake/alert Behavior During Therapy: WFL for tasks assessed/performed Overall Cognitive Status: No family/caregiver present to determine baseline cognitive functioning                                 General Comments: Likely close to cognitive baseline, verbose and tangential with decreased attention to education from therapist      General Comments      Exercises     Assessment/Plan    PT Assessment Patient needs continued PT services  PT Problem List Decreased strength;Decreased activity tolerance;Decreased balance;Decreased mobility;Pain       PT Treatment Interventions DME instruction;Gait training;Functional mobility training;Therapeutic activities;Therapeutic exercise;Balance training;Patient/family education    PT Goals (Current goals can be found in the Care Plan section)  Acute Rehab PT Goals Patient Stated Goal: To go back to ALF PT Goal Formulation: With patient Time For Goal Achievement: 01/23/21 Potential to Achieve Goals: Good    Frequency Min 5X/week   Barriers to discharge        Co-evaluation               AM-PAC PT "6 Clicks" Mobility  Outcome Measure Help needed turning from your back to your side while in a flat bed without using bedrails?: A Little Help needed moving from lying on your back to sitting on the side of a flat bed without using bedrails?: A Little Help needed moving to and from a bed to a chair (including a wheelchair)?: A Little Help needed standing up from a chair using your arms (e.g., wheelchair or bedside chair)?: A Little Help needed to walk  in hospital room?: A Little Help needed climbing 3-5 steps with a railing? : A Lot 6 Click Score: 17    End of Session   Activity Tolerance: Patient limited by fatigue Patient left: in bed;with call bell/phone within reach Nurse Communication: Mobility status PT Visit Diagnosis: Unsteadiness on feet (R26.81);Muscle weakness (generalized) (M62.81);Difficulty in walking, not elsewhere classified (R26.2)    Time: 1017-5102 PT Time Calculation (min) (ACUTE ONLY): 20 min   Charges:   PT Evaluation $PT Eval Low Complexity: 1 Low          Lillia Pauls, PT, DPT Acute Rehabilitation Services Pager [phone] Office [phone]   Norval Morton 01/09/2021, 10:15 AM

## 2021-01-09 NOTE — Discharge Instructions (Addendum)
Wound Care Keep incision covered and dry for two days.    Do not put any creams, lotions, or ointments on incision. Leave steri-strips on back.  They will fall off by themselves.  Activity Walk each and every day, increasing distance each day. No lifting greater than 5 lbs. No driving for 2 weeks; may ride as a passenger locally.  Diet Resume your normal diet.    Call Your Doctor If Any of These Occur Redness, drainage, or swelling at the wound.  Temperature greater than 101 degrees. Severe pain not relieved by pain medication. Incision starts to come apart.  Follow Up Appt Call today for appointment in 1-2 weeks [phone]) or for problems.

## 2021-01-09 NOTE — Evaluation (Signed)
Occupational Therapy Evaluation Patient Details Name: [name] MRN: [id number] DOB: 1945/08/09 Today's Date: 01/09/2021   History of Present Illness Pt is a 75 y.o. male s/p T1-2, 2-3, 11-12, T12-L1 fusion 9/26. Pt with significant PMH for back surgery, cervical fusion (2013), graves disease, macular degeneration, restless leg, and COPD.   Clinical Impression   PTA, pt was living with wife at an ALF, independent in ADLs with use of rollator, and required assistance with IADLs. Pt currently requiring set up;supervision for UB ADLs, Mod A for LB ADLs, Min A for functional mobility. Pt educated on compensatory ADL and functional mobiltiy strategies to adhere to back precautions, given handout. Pt requiring Mod verbal cues for safety and correct use of rollator. Refusing to attempt compensatory techniques other than LB dressing. Due to current level of function, decreased activity tolerance and decreased safety, recommending HHOT. Will continue to follow in the acute setting.      Recommendations for follow up therapy are one component of a multi-disciplinary discharge planning process, led by the attending physician.  Recommendations may be updated based on patient status, additional functional criteria and insurance authorization.   Follow Up Recommendations  Home health OT;Supervision/Assistance - 24 hour    Equipment Recommendations  None recommended by OT    Recommendations for Other Services       Precautions / Restrictions Precautions Precautions: Back Precaution Booklet Issued: Yes (comment) Precaution Comments: Providing handout and education on cback precautions Required Braces or Orthoses: Other Brace Other Brace: No brace per MD order Restrictions Weight Bearing Restrictions: No      Mobility Bed Mobility Overal bed mobility: Needs Assistance Bed Mobility: Rolling;Sidelying to Sit;Sit to Sidelying Rolling: Supervision Sidelying to sit: Supervision     Sit  to sidelying: Min assist      Transfers Overall transfer level: Needs assistance Equipment used: 4-wheeled walker Transfers: Sit to/from Stand Sit to Stand: Min assist;+2 safety/equipment         General transfer comment: From elevated surface. Pt refusing to lock rollator after education on safety with Rollator and required Min A +2 to stabilize Rollator during sit to stand and power up    Balance Overall balance assessment: Mild deficits observed, not formally tested                                         ADL either performed or assessed with clinical judgement   ADL Overall ADL's : Needs assistance/impaired Eating/Feeding: Set up;Sitting   Grooming: Sitting;Set up;Supervision/safety   Upper Body Bathing: Sitting;Set up;Supervision/ safety   Lower Body Bathing: Sit to/from stand;Moderate assistance   Upper Body Dressing : Set up;Sitting;Supervision/safety   Lower Body Dressing: Sit to/from stand;Moderate assistance;Cueing for safety;Cueing for compensatory techniques Lower Body Dressing Details (indicate cue type and reason): Pt educated on LB dressing technique with reacher in sitting after pt not able to complete figure four, reported he used reacher for dressing prior to admission. Attempted reacher but unable due to not having glasses and visual deficits. Assisted to place BLEs in pant legs and needed assist to pull pants up all the way in standing Toilet Transfer: Minimal assistance;RW Toilet Transfer Details (indicate cue type and reason): Simulated at EOB, pt refusing to lock rollator after education on safety with Rollator and required Min A +2 to stabilize Rollator during sit to stand and power up Toileting- Clothing Manipulation and Hygiene: Minimal  assistance;Sit to/from stand;Cueing for sequencing Toileting - Clothing Manipulation Details (indicate cue type and reason): assisted to pull up pants in back in standing     Functional mobility  during ADLs: Rolling walker;+2 for safety/equipment;Cueing for safety;Minimal assistance General ADL Comments: Verbally reviewed precautions. Pt educated on compensatory ADL and functional mobiltiy strategies to adhere to back precautions. Pt refusing to attempt compensatory strategies aside from using reacher for LB dressing. Pt with decreased functional strength, ROM, activity tolerance, and decreased balance.     Vision Baseline Vision/History: 6 Macular Degeneration Ability to See in Adequate Light: 1 Impaired (Hard to assess, reports able to use reacher PTA to donn pants, could not manipulate reacher to grab pants due to vision.) Patient Visual Report: No change from baseline       Perception     Praxis      Pertinent Vitals/Pain Pain Assessment: 0-10     Hand Dominance     Extremity/Trunk Assessment Upper Extremity Assessment Upper Extremity Assessment: Generalized weakness   Lower Extremity Assessment Lower Extremity Assessment: Defer to PT evaluation   Cervical / Trunk Assessment Cervical / Trunk Assessment: Kyphotic   Communication Communication Communication: No difficulties   Cognition Arousal/Alertness: Awake/alert Behavior During Therapy: WFL for tasks assessed/performed Overall Cognitive Status: Within Functional Limits for tasks assessed                                 General Comments: Pt declining all therapist recommendations for modified dressing techniques and safety/correct use of rollator. Loquacious throughout session and becoming frustrated with therapist recommendations.   General Comments       Exercises     Shoulder Instructions      Home Living Family/patient expects to be discharged to:: Private residence Living Arrangements: Spouse/significant other Available Help at Discharge: Family;Personal care attendant Type of Home: Assisted living Home Access: Level entry     Home Layout: One level     Bathroom Shower/Tub:  Walk-in shower         Home Equipment: Environmental consultant - 4 wheels;Shower seat;Walker - 2 wheels   Additional Comments: Pt lives in ALF with wife.      Prior Functioning/Environment Level of Independence: Independent with assistive device(s)        Comments: Uses AE for LB dressing (including a reacher). Using a rollator for functional mobility. Reports he drives (goes the resturants). Wife completes simple meal prep for him.    Pain:                               Faces Pain Scale: Hurts little more      Pain Location: back      Pain Descriptors / Indicators: Grimacing;discomfort      Pain Intervention(s): Monitored during session        OT Problem List: Decreased strength;Decreased range of motion;Decreased activity tolerance      OT Treatment/Interventions: Self-care/ADL training;Therapeutic exercise;DME and/or AE instruction;Therapeutic activities;Balance training;Patient/family education    OT Goals(Current goals can be found in the care plan section) Acute Rehab OT Goals Patient Stated Goal: To go back to ALF OT Goal Formulation: With patient Time For Goal Achievement: 01/26/21 Potential to Achieve Goals: Fair  OT Frequency: Min 2X/week   Barriers to D/C:            Co-evaluation  AM-PAC OT "6 Clicks" Daily Activity     Outcome Measure Help from another person eating meals?: A Little Help from another person taking care of personal grooming?: A Little Help from another person toileting, which includes using toliet, bedpan, or urinal?: A Little Help from another person bathing (including washing, rinsing, drying)?: A Little Help from another person to put on and taking off regular upper body clothing?: A Little Help from another person to put on and taking off regular lower body clothing?: A Lot 6 Click Score: 17   End of Session Equipment Utilized During Treatment: Rolling walker Nurse Communication: Mobility status  Activity Tolerance: Other  (comment);Patient tolerated treatment well (Pt refusing compensatory strategies and safety recommendations, with frustration and loquacious communication limiting performance.) Patient left: in bed;with call bell/phone within reach;with bed alarm set  OT Visit Diagnosis: Unsteadiness on feet (R26.81);Pain;Muscle weakness (generalized) (M62.81)                Time: 6203-5597 OT Time Calculation (min): 36 min Charges:  OT General Charges $OT Visit: 1 Visit OT Evaluation $OT Eval Moderate Complexity: 1 Mod OT Treatments $Self Care/Home Management : 8-22 mins  Prudence Davidson, OTS Acute Rehab Office: [phone]   [name] 01/09/2021, 9:59 AM

## 2021-01-09 NOTE — Progress Notes (Signed)
Report called to receiving Nurse at [facility] in [place]. Awaiting transportation for pick-up. Patient resting in bed quietly

## 2021-01-09 NOTE — Discharge Summary (Signed)
Physician Discharge Summary     Providing Compassionate, Quality Care - Together   Patient ID: [name] MRN: [id number] DOB/AGE: January 11, 1946 75 y.o.  Admit date: 01/08/2021 Discharge date: 01/09/2021  Admission Diagnoses: Myelopathy concurrent with and due to spinal stenosis of thoracic region   Discharge Diagnoses:  Active Problems:   Myelopathy concurrent with and due to spinal stenosis of thoracic region [facility])   Discharged Condition: good  Hospital Course: Patient underwent a T1-2, T2-3 decompressive laminectomy T11-12, T12-L1 decompressive laminectomy by Dr. Jordan Likes on 01/08/2021. He was admitted to 3C03 following recovery from anesthesia in the PACU. His postoperative course has been uncomplicated. He has worked with both physical and occupational therapies who feel the patient is ready for discharge back to assisted living.  He is ambulating with assistance. He is tolerating a normal diet. He is not having any bowel or bladder dysfunction. His pain is well-controlled with oral pain medication. He is ready for discharge to his assisted living facility with home health.   Consults: None  Significant Diagnostic Studies: radiology: DG THORACOLUMABAR SPINE  Result Date: 01/08/2021 CLINICAL DATA:  Localization during laminectomy EXAM: THORACOLUMBAR SPINE 1V COMPARISON:  12/11/2020 FINDINGS: Three fluoroscopic images are obtained during the performance of the procedure and are provided for interpretation only. Surgical instrumentation is seen at the thoracolumbar junction. Instrumentation is seen posterior to the T12 vertebral body. Intra corporal screws are seen within the L3 vertebral body. Please refer to the operative report. FLUOROSCOPY TIME:  11 seconds IMPRESSION: 1. Localization at the thoracolumbar junction as above. Please refer to the operative report. Electronically Signed   By: Sharlet Salina M.D.   On: 01/08/2021 15:18   DG C-Arm 1-60 Min-No Report  Result Date:  01/08/2021 Fluoroscopy was utilized by the requesting physician.  No radiographic interpretation.     Treatments: surgery: T1-2, T2-3 decompressive laminectomy, T11-12, T12-L1 decompressive laminectomy  Discharge Exam: Blood pressure 110/65, pulse 81, temperature 97.8 F (36.6 C), temperature source Oral, resp. rate 17, height 6\' 7"  (2.007 m), weight 121.6 kg, SpO2 93 %.  Alert and oriented x 4 PERRLA CN II-XII grossly intact MAE, Strength and sensation intact Incisions are covered with Honeycomb dressings and Steri Strips; Dressings are clean, dry, and intact Hemovac drains removed this morning. Minimal drainage overnight   Disposition: Discharge disposition: 70-Another Health Care Institution Not Defined       Discharge Instructions     Face-to-face encounter (required for Medicare/Medicaid patients)   Complete by: As directed    I certify that this patient is under my care and that I, or a nurse practitioner or physician's assistant working with me, had a face-to-face encounter that meets the physician face-to-face encounter requirements with this patient on 01/09/2021. The encounter with the patient was in whole, or in part for the following medical condition(s) which is the primary reason for home health care (List medical condition): Thoracic stenosis   The encounter with the patient was in whole, or in part, for the following medical condition, which is the primary reason for home health care: Thoracic stenosis   I certify that, based on my findings, the following services are medically necessary home health services: Physical therapy   Reason for Medically Necessary Home Health Services: Therapy- Therapeutic Exercises to Increase Strength and Endurance   My clinical findings support the need for the above services: Unable to leave home safely without assistance and/or assistive device   Further, I certify that my clinical findings support  that this patient is  homebound due to: Unable to leave home safely without assistance   Home Health   Complete by: As directed    To provide the following care/treatments:  PT OT        Allergies as of 01/09/2021       Reactions   Sulfa Antibiotics Other (See Comments)   Causes blisters and skin to peel from inside mouth        Medication List     TAKE these medications    alfuzosin 10 MG 24 hr tablet Commonly known as: UROXATRAL Take 1 tablet (10 mg total) by mouth daily with breakfast.   benazepril 20 MG tablet Commonly known as: LOTENSIN Take 20 mg by mouth at bedtime.   diazepam 2 MG tablet Commonly known as: VALIUM Take 2 mg by mouth every 12 (twelve) hours as needed for muscle spasms.   docusate sodium 100 MG capsule Commonly known as: Colace Take 1 capsule (100 mg total) by mouth 2 (two) times daily as needed for mild constipation.   DULoxetine 60 MG capsule Commonly known as: CYMBALTA Take 60 mg by mouth at bedtime.   HYDROcodone-acetaminophen 10-325 MG tablet Commonly known as: NORCO Take 1 tablet by mouth every 4 (four) hours as needed for severe pain ((score 7 to 10)).   levothyroxine 125 MCG tablet Commonly known as: SYNTHROID Take 125 mcg by mouth daily before breakfast.   loperamide 2 MG capsule Commonly known as: IMODIUM Take 2 mg by mouth as needed for diarrhea or loose stools. One capsule with each loose stool - max of 5 doses in 24 hours.   vitamin B-12 1000 MCG tablet Commonly known as: CYANOCOBALAMIN Take 1,000 mcg by mouth daily.   Vitamin D3 1.25 MG (50000 UT) Tabs Take 50,000 Units by mouth every 7 (seven) days.   cholecalciferol 25 MCG (1000 UNIT) tablet Commonly known as: VITAMIN D3 Take 1,000 Units by mouth daily.        Follow-up Information     Julio Sicks, MD. Go on 01/18/2021.   Specialty: Neurosurgery Why: First post op appointment is on 01/18/2021 at 1:15 pm. Contact information: [address] [place]  [address] [phone]                 Signed: Val Eagle, DNP, AGNP-C Nurse Practitioner  [facility] [address], [address], [place], [place] [address] P: [phone]    F: [phone]  01/09/2021, 10:10 AM

## 2021-01-19 ENCOUNTER — Emergency Department (HOSPITAL_COMMUNITY): Payer: Medicare Other

## 2021-01-19 ENCOUNTER — Other Ambulatory Visit: Payer: Self-pay

## 2021-01-19 ENCOUNTER — Encounter (HOSPITAL_COMMUNITY): Payer: Self-pay

## 2021-01-19 ENCOUNTER — Emergency Department (HOSPITAL_COMMUNITY)
Admission: EM | Admit: 2021-01-19 | Discharge: 2021-01-20 | Disposition: A | Discharge status: Home / Self Care | Payer: Medicare Other | Attending: Emergency Medicine | Admitting: Emergency Medicine

## 2021-01-19 DIAGNOSIS — F17210 Nicotine dependence, cigarettes, uncomplicated: Secondary | ICD-10-CM | POA: Diagnosis not present

## 2021-01-19 DIAGNOSIS — I10 Essential (primary) hypertension: Secondary | ICD-10-CM | POA: Insufficient documentation

## 2021-01-19 DIAGNOSIS — E039 Hypothyroidism, unspecified: Secondary | ICD-10-CM | POA: Insufficient documentation

## 2021-01-19 DIAGNOSIS — Z20822 Contact with and (suspected) exposure to covid-19: Secondary | ICD-10-CM | POA: Insufficient documentation

## 2021-01-19 DIAGNOSIS — Z79899 Other long term (current) drug therapy: Secondary | ICD-10-CM | POA: Insufficient documentation

## 2021-01-19 DIAGNOSIS — J449 Chronic obstructive pulmonary disease, unspecified: Secondary | ICD-10-CM | POA: Diagnosis not present

## 2021-01-19 DIAGNOSIS — Z96651 Presence of right artificial knee joint: Secondary | ICD-10-CM | POA: Insufficient documentation

## 2021-01-19 DIAGNOSIS — D649 Anemia, unspecified: Secondary | ICD-10-CM | POA: Diagnosis not present

## 2021-01-19 DIAGNOSIS — R0602 Shortness of breath: Secondary | ICD-10-CM | POA: Diagnosis present

## 2021-01-19 LAB — CBC WITH DIFFERENTIAL/PLATELET
Abs Immature Granulocytes: 0.04 10*3/uL (ref 0.00–0.07)
Basophils Absolute: 0 10*3/uL (ref 0.0–0.1)
Basophils Relative: 1 %
Eosinophils Absolute: 0.1 10*3/uL (ref 0.0–0.5)
Eosinophils Relative: 2 %
HCT: 29.1 % — ABNORMAL LOW (ref 39.0–52.0)
Hemoglobin: 9.7 g/dL — ABNORMAL LOW (ref 13.0–17.0)
Immature Granulocytes: 1 %
Lymphocytes Relative: 16 %
Lymphs Abs: 1.3 10*3/uL (ref 0.7–4.0)
MCH: 32.2 pg (ref 26.0–34.0)
MCHC: 33.3 g/dL (ref 30.0–36.0)
MCV: 96.7 fL (ref 80.0–100.0)
Monocytes Absolute: 0.5 10*3/uL (ref 0.1–1.0)
Monocytes Relative: 7 %
Neutro Abs: 6.2 10*3/uL (ref 1.7–7.7)
Neutrophils Relative %: 73 %
Platelets: 218 10*3/uL (ref 150–400)
RBC: 3.01 MIL/uL — ABNORMAL LOW (ref 4.22–5.81)
RDW: 14.3 % (ref 11.5–15.5)
WBC: 8.2 10*3/uL (ref 4.0–10.5)
nRBC: 0 % (ref 0.0–0.2)

## 2021-01-19 LAB — BASIC METABOLIC PANEL
Anion gap: 5 (ref 5–15)
BUN: 23 mg/dL (ref 8–23)
CO2: 28 mmol/L (ref 22–32)
Calcium: 8.9 mg/dL (ref 8.9–10.3)
Chloride: 103 mmol/L (ref 98–111)
Creatinine, Ser: 0.97 mg/dL (ref 0.61–1.24)
GFR, Estimated: >60 mL/min (ref >60)
Glucose, Bld: 109 mg/dL — ABNORMAL HIGH (ref 70–99)
Potassium: 4.8 mmol/L (ref 3.5–5.1)
Sodium: 136 mmol/L (ref 135–145)

## 2021-01-19 LAB — RETICULOCYTES
Immature Retic Fract: 13.3 % (ref 2.3–15.9)
RBC.: 3 MIL/uL — ABNORMAL LOW (ref 4.22–5.81)
Retic Count, Absolute: 58.2 10*3/uL (ref 19.0–186.0)
Retic Ct Pct: 1.9 % (ref 0.4–3.1)

## 2021-01-19 LAB — FOLATE: Folate: 36.1 ng/mL (ref >5.9)

## 2021-01-19 LAB — IRON AND TIBC
Iron: 56 ug/dL (ref 45–182)
Saturation Ratios: 17 % — ABNORMAL LOW (ref 17.9–39.5)
TIBC: 326 ug/dL (ref 250–450)
UIBC: 270 ug/dL

## 2021-01-19 LAB — FERRITIN: Ferritin: 119 ng/mL (ref 24–336)

## 2021-01-19 LAB — RESP PANEL BY RT-PCR (FLU A&B, COVID) ARPGX2
Influenza A by PCR: NEGATIVE
Influenza B by PCR: NEGATIVE
SARS Coronavirus 2 by RT PCR: NEGATIVE

## 2021-01-19 LAB — VITAMIN B12: Vitamin B-12: 436 pg/mL (ref 180–914)

## 2021-01-19 LAB — BRAIN NATRIURETIC PEPTIDE: B Natriuretic Peptide: 9 pg/mL (ref 0.0–100.0)

## 2021-01-19 LAB — POC OCCULT BLOOD, ED: Fecal Occult Bld: NEGATIVE

## 2021-01-19 MED ORDER — IOHEXOL 350 MG/ML SOLN
100.0000 mL | Freq: Once | INTRAVENOUS | Status: AC | PRN
  Administered 2021-01-19: 100 mL via INTRAVENOUS

## 2021-01-19 MED ORDER — FERROUS SULFATE 325 (65 FE) MG PO TABS: 325.0000 mg | ORAL_TABLET | Freq: Every day | ORAL | 0 refills | Status: DC

## 2021-01-19 NOTE — ED Triage Notes (Signed)
Reports sob that has been going on since back fusion on 9/26.  Reports worse today due to wife having air condition on in the house.  Lungs cta.  Resp even and unlabored.  Reports cough today.  Denies fever.

## 2021-01-19 NOTE — ED Provider Notes (Signed)
[facility] EMERGENCY DEPARTMENT Provider Note   CSN: [id number] Arrival date & time: 01/19/21  1521     History Chief Complaint  Patient presents with   Shortness of Breath    [name] is a 75 y.o. male.  Pt presents to the ED today with sob.  Pt had a T1-2, T2-3 decompressive laminectomy T11-12, T12-L1 decompressive laminectomy by Dr. Jordan Likes on 01/08/2021. He has been sob since d/c. He is not on blood thinners.  No cough.  No f/c.  Pt said he was supposed to go to a rehab facility, but they did not have any beds, so he went back to his assisted living facility.  He said they have not gotten him up at all and he's not been able to walk with a walker since he's been there.         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) 01/08/2021   Acute cystitis without hematuria 08/18/2020   Nocturia 08/18/2020   Kidney stones 07/04/2020   Benign prostatic hyperplasia with urinary obstruction 07/04/2020   Chest pain, rule out acute myocardial infarction 01/31/2019   COPD with acute exacerbation (HCC) 01/31/2019   Acute respiratory failure with hypoxia (HCC) 01/31/2019   HTN (hypertension) 01/31/2019    Past Surgical History:  Procedure Laterality Date   BACK SURGERY     Fusion 2005   Graves Disease     JOINT REPLACEMENT  1970   Right knee    NECK SURGERY     POSTERIOR CERVICAL FUSION/FORAMINOTOMY  12/05/2011   Procedure: POSTERIOR CERVICAL FUSION/FORAMINOTOMY LEVEL 4;  Surgeon: Reinaldo Meeker, MD;  Location: [facility];  Service: Neurosurgery;  Laterality: N/A;  Posterior Cervical  fixation Cervical four-Seven,Cervical Decompresson Cervical three   THORACIC DISCECTOMY N/A 01/08/2021   Procedure: Thoracic One-Two, Thoracic Two-Three, Thoracic Eleven-Twelve, Thoracic Twelve-Lumbar One Laminectomy;  Surgeon: Julio Sicks, MD;  Location: [facility] OR;  Service: Neurosurgery;  Laterality: N/A;       Family History  Problem Relation Age of Onset   Heart attack Mother 24   Heart disease Father     Social History   Tobacco Use   Smoking status: Some Days    Years: 50.00    Types: Cigarettes    Last attempt to quit: 11/29/2011    Years since quitting: 9.1   Smokeless tobacco: Never  Vaping Use   Vaping Use: Never used  Substance Use Topics   Alcohol use: No   Drug use: No    Home Medications Prior to Admission medications   Medication Sig Start Date End Date Taking? Authorizing Provider  alfuzosin (UROXATRAL) 10 MG 24 hr tablet Take 1 tablet (10 mg total) by mouth daily with breakfast. 08/18/20  Yes [name], [name], MD  benazepril (LOTENSIN) 20 MG tablet Take 20 mg by mouth at bedtime.    Yes [provider]  Cholecalciferol (VITAMIN D3) 1.25 MG (50000 UT) TABS Take 50,000 Units by mouth every 7 (seven) days.   Yes [provider]  cholecalciferol (VITAMIN D3) 25 MCG (1000 UNIT) tablet Take 1,000 Units by mouth daily.   Yes [provider]  diazepam (VALIUM) 2 MG tablet Take 2 mg by mouth every 12 (twelve) hours as needed for muscle spasms.   Yes [provider]  DULoxetine (CYMBALTA) 60 MG capsule Take 60 mg by mouth at bedtime.  12/28/18  Yes [provider]  ferrous sulfate 325 (65 FE) MG tablet Take 1 tablet (325 mg total) by mouth daily. 01/19/21  Yes Jacalyn Lefevre, MD  HYDROcodone-acetaminophen (NORCO) 10-325 MG tablet Take 1 tablet by mouth every 4 (four) hours as needed for severe pain ((score 7 to 10)). 01/09/21  Yes Val Eagle D, NP  levothyroxine (SYNTHROID) 125 MCG tablet Take 125 mcg by mouth daily before  breakfast. 06/20/20  Yes [provider]  loperamide (IMODIUM) 2 MG capsule Take 2 mg by mouth as needed for diarrhea or loose stools. One capsule with each loose stool - max of 5 doses in 24 hours.   Yes [provider]  vitamin B-12 (CYANOCOBALAMIN) 1000 MCG tablet Take 1,000 mcg by mouth daily.   Yes [provider]  docusate sodium (COLACE) 100 MG capsule Take 1 capsule (100 mg total) by mouth 2 (two) times daily as needed for mild constipation. Patient not taking: Reported on 01/19/2021 01/09/21 01/09/22  Val Eagle D, NP    Allergies    Sulfa antibiotics  Review of Systems   Review of Systems  Respiratory:  Positive for shortness of breath.   All other systems reviewed and are negative.  Physical Exam Updated Vital Signs BP (!) 126/95 (BP Location: Right Arm)   Pulse 84   Temp 97.8 F (36.6 C) (Oral)   Resp (!) 21   Ht 6\' 7"  (2.007 m)   Wt 119.7 kg   SpO2 97%   BMI 29.74 kg/m   Physical Exam Vitals and nursing note reviewed.  Constitutional:      Appearance: He is well-developed.  HENT:     Head: Normocephalic and atraumatic.     Mouth/Throat:     Mouth: Mucous membranes are moist.     Pharynx: Oropharynx is clear.  Eyes:     Extraocular Movements: Extraocular movements intact.     Pupils: Pupils are equal, round, and reactive to light.  Cardiovascular:     Rate and Rhythm: Normal rate and regular rhythm.  Pulmonary:     Effort: Pulmonary effort is normal.     Breath sounds: Normal breath sounds.  Abdominal:     General: Bowel sounds are normal.     Palpations: Abdomen is soft.  Genitourinary:    Rectum: Guaiac result negative.     Comments: Yellow stool Musculoskeletal:        General: Normal range of motion.     Cervical back: Normal range of motion and neck supple.  Skin:    General: Skin is warm.     Capillary Refill: Capillary refill takes less than 2 seconds.  Neurological:     Mental Status: He is alert and oriented to  person, place, and time.     Comments: No new weakness.  Both legs are weak (left worse than right) chronically.  Psychiatric:        Mood and Affect: Mood normal.        Behavior: Behavior normal.    ED Results / Procedures / Treatments   Labs (all labs ordered are listed, but only abnormal results are displayed) Labs Reviewed  BASIC METABOLIC PANEL - Abnormal; Notable for the following components:      Result Value   Glucose, Bld 109 (*)    All other components within normal limits  CBC WITH DIFFERENTIAL/PLATELET - Abnormal; Notable for the following components:   RBC 3.01 (*)    Hemoglobin 9.7 (*)    HCT 29.1 (*)    All other components within normal limits  RESP PANEL BY RT-PCR (FLU A&B, COVID) ARPGX2  BRAIN NATRIURETIC PEPTIDE  URINALYSIS, ROUTINE W REFLEX MICROSCOPIC  VITAMIN B12  FOLATE  IRON AND TIBC  FERRITIN  RETICULOCYTES  POC OCCULT BLOOD, ED    EKG None  Radiology CT Angio Chest PE W and/or Wo Contrast  Result Date: 01/19/2021 CLINICAL DATA:  Recent back surgery cough short of breath EXAM: CT ANGIOGRAPHY CHEST WITH CONTRAST TECHNIQUE: Multidetector CT imaging of the chest was performed using the standard protocol during bolus administration of intravenous contrast. Multiplanar CT image reconstructions and MIPs were obtained to evaluate the vascular anatomy. CONTRAST:  OMNIPAQUE IOHEXOL 350 MG/ML SOLN COMPARISON:  CT 12/11/2020 FINDINGS: Cardiovascular: Satisfactory opacification of the pulmonary arteries to the segmental level. No evidence of pulmonary embolism. Mild aortic atherosclerosis. Mild aneurysmal dilatation of ascending aorta up to 4.3 cm. Coronary vascular calcification. Normal cardiac size. No pericardial effusion Mediastinum/Nodes: Midline trachea. No suspicious thyroid nodules. No suspicious adenopathy. Esophagus within normal limits. Lungs/Pleura: Lungs are clear. No pleural effusion or pneumothorax. Upper Abdomen: No acute abnormality.  Musculoskeletal: Partially visualized surgical changes at the cervicothoracic spine. Fluid collection within the posterior paraspinal soft tissues with punctate focus of gas. Laminectomy changes of the upper thoracic spine. Review of the MIP images confirms the above findings. IMPRESSION: 1. Negative for acute pulmonary embolus.  Clear lung fields. 2. Ascending aortic dilatation up to 4.3 cm. Recommend annual imaging followup by CTA or MRA. This recommendation follows 2010 ACCF/AHA/AATS/ACR/ASA/SCA/SCAI/SIR/STS/SVM Guidelines for the Diagnosis and Management of Patients with Thoracic Aortic Disease. Circulation. 2010; 121: F790-W409. Aortic aneurysm NOS (ICD10-I71.9) 3. Incompletely visualized postoperative changes of the cervicothoracic spine. Fluid collection within the midline posterior paraspinal soft tissues at the upper thoracic spine with small focus gas presumably due to recent surgery. Aortic Atherosclerosis (ICD10-I70.0).Aortic aneurysm NOS (ICD10-I71.9). Electronically Signed   By: Jasmine Pang M.D.   On: 01/19/2021 17:19    Procedures Procedures   Medications Ordered in ED Medications  iohexol (OMNIPAQUE) 350 MG/ML injection 100 mL (100 mLs Intravenous Contrast Given 01/19/21 1650)    ED Course  I have reviewed the triage vital signs and the nursing notes.  Pertinent labs & imaging results that were available during my care of the patient were reviewed by me and considered in my medical decision making (see chart for details).    MDM Rules/Calculators/A&P                           Pt's hgb has dropped quite a bit since his surgery.  Stool is guaiac negative and is yellow.  No hx black stools.  Anemia panel sent.  I will start him on iron to see if this will help.  Pt said the facility is trying to get him to rehab.  They told him that we'd be able to send him there after his ED visit.  Unfortunately, this is not the case.  This is something that needs to be done as an outpatient.    Covid negative.  CT neg for PE.  He is  oxygenating well and looks nontoxic.    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  He is told this needs to be followed yearly.  Pt is stable for d/c.  Return if worse.   Final Clinical Impression(s) / ED Diagnoses Final diagnoses:  Shortness of breath  Anemia, unspecified type    Rx / DC Orders ED Discharge Orders          Ordered    ferrous sulfate 325 (65 FE) MG tablet  Daily        01/19/21 1812             Jacalyn Lefevre, MD 01/19/21 1813

## 2021-01-19 NOTE — Discharge Instructions (Addendum)
You do have an ascending aortic aneurysm that is 4.3 cm.  This needs to be followed yearly by your doctor.

## 2021-02-16 ENCOUNTER — Ambulatory Visit: Payer: Medicare Other | Admitting: Urology

## 2021-03-16 ENCOUNTER — Ambulatory Visit (INDEPENDENT_AMBULATORY_CARE_PROVIDER_SITE_OTHER): Payer: Medicare Other | Admitting: Urology

## 2021-03-16 ENCOUNTER — Encounter: Payer: Self-pay | Admitting: Urology

## 2021-03-16 ENCOUNTER — Other Ambulatory Visit: Payer: Self-pay

## 2021-03-16 VITALS — BP 96/65 | HR 107

## 2021-03-16 DIAGNOSIS — N401 Enlarged prostate with lower urinary tract symptoms: Secondary | ICD-10-CM

## 2021-03-16 DIAGNOSIS — N138 Other obstructive and reflux uropathy: Secondary | ICD-10-CM | POA: Diagnosis not present

## 2021-03-16 DIAGNOSIS — R351 Nocturia: Secondary | ICD-10-CM | POA: Diagnosis not present

## 2021-03-16 DIAGNOSIS — N200 Calculus of kidney: Secondary | ICD-10-CM

## 2021-03-16 MED ORDER — TAMSULOSIN HCL 0.4 MG PO CAPS: 0.4000 mg | ORAL_CAPSULE | Freq: Two times a day (BID) | ORAL | 11 refills | Status: DC

## 2021-03-16 NOTE — Patient Instructions (Signed)

## 2021-03-16 NOTE — Progress Notes (Signed)
post void residual=155 Urological Symptom Review  Patient is experiencing the following symptoms: none   Review of Systems  Gastrointestinal (upper)  : Negative for upper GI symptoms  Gastrointestinal (lower) : Negative for lower GI symptoms  Constitutional : Negative for symptoms  Skin: Negative for skin symptoms  Eyes: Negative for eye symptoms  Ear/Nose/Throat : Negative for Ear/Nose/Throat symptoms  Hematologic/Lymphatic: Negative for Hematologic/Lymphatic symptoms  Cardiovascular : Negative for cardiovascular symptoms  Respiratory : Negative for respiratory symptoms  Endocrine: Negative for endocrine symptoms  Musculoskeletal: Negative for musculoskeletal symptoms  Neurological: Negative for neurological symptoms  Psychologic: negative

## 2021-03-16 NOTE — Progress Notes (Signed)
03/16/2021 10:06 AM   [name] 1945-05-04 [id number]  Referring provider: Galvin Proffer, MD [address] [place],  [place] [address]  Followup BPH and nocturia   HPI: Mr [name] is a 75yo here for followup for BPH and nocturia. PVR 155cc. IPSS 15 QOL 3 on flomax daily. Nocturia 2-3x. Urine stream is strong. Mild urinary frequency every 2-3 hours. He has occasional urinary hesitancy. He got 1 UTI since last visit. No other complaints.    PMH: Past Medical History:  Diagnosis Date   Arthritis    Bronchitis    hx of   COPD (chronic obstructive pulmonary disease) (HCC)    GERD (gastroesophageal reflux disease)    History of kidney stones    Hypertension    Hypothyroidism    Macular degeneration    right eye   Restless leg syndrome     Surgical History: Past Surgical History:  Procedure Laterality Date   BACK SURGERY     Fusion 2005   Graves Disease     JOINT REPLACEMENT  1970   Right knee    NECK SURGERY     POSTERIOR CERVICAL FUSION/FORAMINOTOMY  12/05/2011   Procedure: POSTERIOR CERVICAL FUSION/FORAMINOTOMY LEVEL 4;  Surgeon: Reinaldo Meeker, MD;  Location: [facility];  Service: Neurosurgery;  Laterality: N/A;  Posterior Cervical fixation Cervical four-Seven,Cervical Decompresson Cervical three   THORACIC DISCECTOMY N/A 01/08/2021   Procedure: Thoracic One-Two, Thoracic Two-Three, Thoracic Eleven-Twelve, Thoracic Twelve-Lumbar One Laminectomy;  Surgeon: Julio Sicks, MD;  Location: [facility] OR;  Service: Neurosurgery;  Laterality: N/A;    Home Medications:  Allergies as of 03/16/2021       Reactions   Sulfa Antibiotics Other (See Comments)   Causes blisters and skin to peel from inside mouth        Medication List        Accurate as of March 16, 2021 10:06 AM. If you have any questions, ask your nurse or doctor.          alfuzosin 10 MG 24 hr tablet Commonly known as: UROXATRAL Take 1 tablet (10 mg total) by mouth daily with  breakfast.   benazepril 20 MG tablet Commonly known as: LOTENSIN Take 20 mg by mouth at bedtime.   diazepam 2 MG tablet Commonly known as: VALIUM Take 2 mg by mouth every 12 (twelve) hours as needed for muscle spasms.   docusate sodium 100 MG capsule Commonly known as: Colace Take 1 capsule (100 mg total) by mouth 2 (two) times daily as needed for mild constipation.   DULoxetine 60 MG capsule Commonly known as: CYMBALTA Take 60 mg by mouth at bedtime.   ferrous sulfate 325 (65 FE) MG tablet Take 1 tablet (325 mg total) by mouth daily.   HYDROcodone-acetaminophen 10-325 MG tablet Commonly known as: NORCO Take 1 tablet by mouth every 4 (four) hours as needed for severe pain ((score 7 to 10)).   levothyroxine 125 MCG tablet Commonly known as: SYNTHROID Take 125 mcg by mouth daily before breakfast.   loperamide 2 MG capsule Commonly known as: IMODIUM Take 2 mg by mouth as needed for diarrhea or loose stools. One capsule with each loose stool - max of 5 doses in 24 hours.   tamsulosin 0.4 MG Caps capsule Commonly known as: FLOMAX Take 0.4 mg by mouth daily.   vitamin B-12 1000 MCG tablet Commonly known as: CYANOCOBALAMIN Take 1,000 mcg by mouth daily.   Vitamin D3 1.25 MG (50000 UT) Tabs Take  50,000 Units by mouth every 7 (seven) days.   cholecalciferol 25 MCG (1000 UNIT) tablet Commonly known as: VITAMIN D3 Take 1,000 Units by mouth daily.        Allergies:  Allergies  Allergen Reactions   Sulfa Antibiotics Other (See Comments)    Causes blisters and skin to peel from inside mouth    Family History: Family History  Problem Relation Age of Onset   Heart attack Mother 75   Heart disease Father     Social History:  reports that he has been smoking cigarettes. He has never used smokeless tobacco. He reports that he does not drink alcohol and does not use drugs.  ROS: All other review of systems were reviewed and are negative except what is noted above in  HPI  Physical Exam: BP 96/65   Pulse (!) 107   Constitutional:  Alert and oriented, No acute distress. HEENT: [place] AT, moist mucus membranes.  Trachea midline, no masses. Cardiovascular: No clubbing, cyanosis, or edema. Respiratory: Normal respiratory effort, no increased work of breathing. GI: Abdomen is soft, nontender, nondistended, no abdominal masses GU: No CVA tenderness.  Lymph: No cervical or inguinal lymphadenopathy. Skin: No rashes, bruises or suspicious lesions. Neurologic: Grossly intact, no focal deficits, moving all 4 extremities. Psychiatric: Normal mood and affect.  Laboratory Data: Lab Results  Component Value Date   WBC 8.2 01/19/2021   HGB 9.7 (L) 01/19/2021   HCT 29.1 (L) 01/19/2021   MCV 96.7 01/19/2021   PLT 218 01/19/2021    Lab Results  Component Value Date   CREATININE 0.97 01/19/2021    No results found for: PSA  No results found for: TESTOSTERONE  No results found for: HGBA1C  Urinalysis    Component Value Date/Time   APPEARANCEUR Cloudy (A) 08/18/2020 1504   GLUCOSEU Negative 08/18/2020 1504   BILIRUBINUR Negative 08/18/2020 1504   PROTEINUR Trace (A) 08/18/2020 1504   NITRITE Positive (A) 08/18/2020 1504   LEUKOCYTESUR 3+ (A) 08/18/2020 1504    Lab Results  Component Value Date   LABMICR See below: 08/18/2020   WBCUA >30 (A) 08/18/2020   LABEPIT 0-10 08/18/2020   MUCUS Present 07/04/2020   BACTERIA Many (A) 08/18/2020    Pertinent Imaging:  No results found for this or any previous visit.  No results found for this or any previous visit.  No results found for this or any previous visit.  No results found for this or any previous visit.  No results found for this or any previous visit.  No results found for this or any previous visit.  No results found for this or any previous visit.  Results for orders placed during the hospital encounter of 08/10/20  CT RENAL STONE STUDY  Narrative CLINICAL DATA:  Urinary tract  calculus, follow-up nephrolithiasis in a patient with urinary frequency.  EXAM: CT ABDOMEN AND PELVIS WITHOUT CONTRAST  TECHNIQUE: Multidetector CT imaging of the abdomen and pelvis was performed following the standard protocol without IV contrast.  COMPARISON:  June 28, 2004.  FINDINGS: Lower chest: Lung bases are clear. No effusion. No consolidation. Three-vessel coronary artery disease. Heart is incompletely imaged without pericardial effusion. Normal heart size.  Hepatobiliary: Cholelithiasis. No gross biliary duct distension. The liver unremarkable on noncontrast imaging.  Pancreas: Normal, without mass, inflammation or ductal dilatation.  Spleen: Subtle density focus in the spleen likely benign though too small for definitive characterization and 9 mm.  Adrenals/Urinary Tract: Adrenal glands are normal.  Cystic renal lesions bilaterally.  Cysts arise from the bilateral kidneys. Largest arises from the lower pole of the RIGHT kidney measuring 4.6 x 3.2 cm. Density measurements for renal lesions less than 20 Hounsfield units. Some of the measurements obtained in the lower pole may be compromised by streak artifact from adjacent spinal hardware.  Kidneys show malrotation with renal pelves directed anteriorly. No hydronephrosis. No ureteral calculi.  Punctate LEFT intrarenal calculus measures approximately 2 mm in the upper pole. No perinephric stranding. Urinary bladder with smooth contours.  Stomach/Bowel: Scattered fluid-filled loops of small bowel. No signs of bowel obstruction or adjacent stranding. Stomach without dilation or adjacent stranding.  Appendix is normal.  Vascular/Lymphatic: Calcified atheromatous plaque in the abdominal aorta measuring 3.0 x 2.9 cm. Smooth contour of the IVC. There is no gastrohepatic or hepatoduodenal ligament lymphadenopathy. No retroperitoneal or mesenteric lymphadenopathy.  No pelvic sidewall  lymphadenopathy.  Reproductive: Unremarkable.  Other: No ascites.  Musculoskeletal: Spinal degenerative changes with areas of lumbar spinal narrowing due to facet arthropathy, short pedicles and degenerative disc disease showing a similar appearance to prior imaging. Laminectomy changes and changes of spinal fusion in the lumbar spine with similar pattern. Spinal fusion extending from L3 through L5. Degenerative disc disease causes further narrowing at L1-2 than on previous imaging still with considerable narrowing at the T12-L1 level also worse than on the prior study. No acute bone finding. No destructive bone process.  Degenerative changes in the hips RIGHT greater than LEFT.  IMPRESSION: 1. Nephrolithiasis without hydronephrosis or perinephric stranding. 2. Spinal degenerative changes with signs of narrowing of the central canal due to facet and degenerative disc disease along with short pedicles this appears worse particularly at the T12-L1 in the L1-L2 levels. Multilevel spinal fusion as before. Correlate with any worsening symptoms. 3. Renal cysts bilaterally. Given compromised density measurements in the setting of spinal hardware would consider sonographic follow-up of the kidneys in 6 months to confirm simple cystic features. 4. Cholelithiasis. 5. Three-vessel coronary artery disease. 6.  atherosclerosis.  Aortic Atherosclerosis (ICD10-I70.0).   Electronically Signed By: Donzetta Kohut M.D. On: 08/11/2020 08:43   Assessment & Plan:    1. Nocturia -Increase flomax to 0.4mg  BID - Urinalysis, Routine w reflex microscopic - BLADDER SCAN AMB NON-IMAGING  2. Benign prostatic hyperplasia with urinary obstruction -Increase flomax to BID.   No follow-ups on file.  Wilkie Aye, MD  [facility] [place]

## 2021-03-26 ENCOUNTER — Telehealth: Payer: Self-pay

## 2021-03-26 NOTE — Telephone Encounter (Signed)
Patients wife called and advised patient is no in an assisted living facility [facility]) and after arriving he started having painful urination and his urine is darker than usual. Patients wife advised me that he is unable to come give a urine sample. They wanted to know if medication could be called in. If not I can see if the facility can drop off a urine sample.

## 2021-03-26 NOTE — Telephone Encounter (Signed)
Patient will need to have a urine sample completed.

## 2021-05-09 ENCOUNTER — Ambulatory Visit (INDEPENDENT_AMBULATORY_CARE_PROVIDER_SITE_OTHER): Payer: 59 | Admitting: Physician Assistant

## 2021-05-09 ENCOUNTER — Other Ambulatory Visit: Payer: Self-pay

## 2021-05-09 ENCOUNTER — Encounter: Payer: Self-pay | Admitting: Physician Assistant

## 2021-05-09 VITALS — BP 100/65 | HR 102 | Wt 250.0 lb

## 2021-05-09 DIAGNOSIS — R351 Nocturia: Secondary | ICD-10-CM

## 2021-05-09 DIAGNOSIS — R339 Retention of urine, unspecified: Secondary | ICD-10-CM | POA: Diagnosis not present

## 2021-05-09 DIAGNOSIS — N3000 Acute cystitis without hematuria: Secondary | ICD-10-CM | POA: Diagnosis not present

## 2021-05-09 DIAGNOSIS — N200 Calculus of kidney: Secondary | ICD-10-CM

## 2021-05-09 LAB — URINALYSIS, ROUTINE W REFLEX MICROSCOPIC
Bilirubin, UA: NEGATIVE
Glucose, UA: NEGATIVE
Ketones, UA: NEGATIVE
Nitrite, UA: POSITIVE — AB
Specific Gravity, UA: 1.015 (ref 1.005–1.030)
Urobilinogen, Ur: 0.2 mg/dL (ref 0.2–1.0)
pH, UA: 7 (ref 5.0–7.5)

## 2021-05-09 LAB — MICROSCOPIC EXAMINATION
Renal Epithel, UA: NONE SEEN /hpf
WBC, UA: >30 /hpf — AB (ref 0–5)

## 2021-05-09 LAB — BLADDER SCAN AMB NON-IMAGING

## 2021-05-09 MED ORDER — NITROFURANTOIN MONOHYD MACRO 100 MG PO CAPS: 100.0000 mg | ORAL_CAPSULE | Freq: Two times a day (BID) | ORAL | 0 refills | Status: DC

## 2021-05-09 MED ORDER — NITROFURANTOIN MONOHYD MACRO 100 MG PO CAPS: 100.0000 mg | ORAL_CAPSULE | Freq: Two times a day (BID) | ORAL | 0 refills | Status: AC

## 2021-05-09 NOTE — Progress Notes (Signed)
Symptom Review  Patient is experiencing the following symptoms: Frequent urination Hard to postpone urination Burning/pain with urination Get up at night to urinate Blood in urine Urinary tract infection   Review of Systems  Gastrointestinal (upper)  : Indigestion/heartburn  Gastrointestinal (lower) : Constipation  Constitutional : Negative for symptoms  Skin: Negative for skin symptoms  Eyes: Negative for eye symptoms  Ear/Nose/Throat : Negative for Ear/Nose/Throat symptoms  Hematologic/Lymphatic: Negative for Hematologic/Lymphatic symptoms  Cardiovascular : Negative for cardiovascular symptoms  Respiratory : Negative for respiratory symptoms  Endocrine: Negative for endocrine symptoms  Musculoskeletal: Back pain  Neurological: Negative for neurological symptoms  Psychologic: Negative for psychiatric symptoms

## 2021-05-09 NOTE — Progress Notes (Signed)
post void residual= 343 ml

## 2021-05-09 NOTE — Progress Notes (Signed)
Assessment: 1. Acute cystitis without hematuria   2. Kidney stones   3. Nocturia   4. Incomplete bladder emptying     Plan: Incomplete emptying likely due to recurrent UTI.  Urine sent for culture and prescription for Macrobid given. Previous renal fxn normal per med records. Patient will follow up in 3 to 4 weeks for recheck urinalysis and PVR.  We will adjust treatment pending urine culture results. Continue tamsulosin as previously Rx'd. There is no evidence of stone disease on his most recent imaging report.   Chief Complaint: No chief complaint on file. Urinary burning  HPI: 03/16/21 [name] is a 76yo here for followup for BPH and nocturia. PVR 155cc. IPSS 15 QOL 3 on flomax daily. Nocturia 2-3x. Urine stream is strong. Mild urinary frequency every 2-3 hours. He has occasional urinary hesitancy. He got 1 UTI since last visit. No other complaints.   05/09/21 Andrew "Stephens November" is a 76 y.o. male who presents for evaluation of urinary burning, frequency, urgency and hematuria x 4 days. No feeling of incomplete emptying. Pyridium Rx at onset which provided some relief. Pt has h/o stones and is concerned sxs are due to passage of fragments. He denies fever, chills, nausea, vomiting. UA at [facility] indicates acute UTI. IPPS=7, Nocturia x 2  UA-  >30 WBC  11-30 RBC   Many bacteria  PVR- 386ml CT Stone study 4/22 indicated no stone burden  Portions of the above documentation were copied from a prior visit for review purposes only.  Allergies: Allergies  Allergen Reactions   Sulfa Antibiotics Other (See Comments)    Causes blisters and skin to peel from inside mouth    PMH: Past Medical History:  Diagnosis Date   Arthritis    Bronchitis    hx of   COPD (chronic obstructive pulmonary disease) (HCC)    GERD (gastroesophageal reflux disease)    History of kidney stones    Hypertension    Hypothyroidism    Macular degeneration    right eye   Restless leg  syndrome     PSH: Past Surgical History:  Procedure Laterality Date   BACK SURGERY     Fusion 2005   Graves Disease     JOINT REPLACEMENT  1970   Right knee    NECK SURGERY     POSTERIOR CERVICAL FUSION/FORAMINOTOMY  12/05/2011   Procedure: POSTERIOR CERVICAL FUSION/FORAMINOTOMY LEVEL 4;  Surgeon: Faythe Ghee, MD;  Location: [facility];  Service: Neurosurgery;  Laterality: N/A;  Posterior Cervical fixation Cervical four-Seven,Cervical Decompresson Cervical three   THORACIC DISCECTOMY N/A 01/08/2021   Procedure: Thoracic One-Two, Thoracic Two-Three, Thoracic Eleven-Twelve, Thoracic Twelve-Lumbar One Laminectomy;  Surgeon: Earnie Larsson, MD;  Location: [place];  Service: Neurosurgery;  Laterality: N/A;    SH: Social History   Tobacco Use   Smoking status: Some Days    Years: 50.00    Types: Cigarettes    Last attempt to quit: 11/29/2011    Years since quitting: 9.4   Smokeless tobacco: Never  Vaping Use   Vaping Use: Never used  Substance Use Topics   Alcohol use: No   Drug use: No    ROS: Constitutional:  Negative for fever, chills, weight loss CV: Negative for chest pain, previous MI, hypertension Respiratory:  Negative for shortness of breath, wheezing, sleep apnea, frequent cough GI:  Negative for nausea, vomiting, bloody stool, GERD  PE: BP 100/65    Pulse (!) 102    Wt  250 lb (113.4 kg)    BMI 28.16 kg/m  GENERAL APPEARANCE:  Well appearing, well developed, well nourished, NAD HEENT:  Atraumatic, normocephalic NECK:  Supple. Trachea midline ABDOMEN:  Soft, non-tender, no masses EXTREMITIES:  Moves all extremities well, without clubbing, cyanosis, or edema NEUROLOGIC:  Alert and oriented x 3, in wheelchair MENTAL STATUS:  appropriate behavior BACK:  Non-tender to palpation, Mild left sided CVAT SKIN:  Warm, dry, and intact   Results: Laboratory Data: Lab Results  Component Value Date   WBC 8.2 01/19/2021   HGB 9.7 (L) 01/19/2021   HCT 29.1 (L) 01/19/2021    MCV 96.7 01/19/2021   PLT 218 01/19/2021    Lab Results  Component Value Date   CREATININE 0.97 01/19/2021    No results found for: PSA  No results found for: TESTOSTERONE  No results found for: HGBA1C  Urinalysis    Component Value Date/Time   APPEARANCEUR Cloudy (A) 08/18/2020 1504   GLUCOSEU Negative 08/18/2020 1504   BILIRUBINUR Negative 08/18/2020 1504   PROTEINUR Trace (A) 08/18/2020 1504   NITRITE Positive (A) 08/18/2020 1504   LEUKOCYTESUR 3+ (A) 08/18/2020 1504    Lab Results  Component Value Date   LABMICR See below: 08/18/2020   WBCUA >30 (A) 08/18/2020   LABEPIT 0-10 08/18/2020   MUCUS Present 07/04/2020   BACTERIA Many (A) 08/18/2020    Pertinent Imaging:  Results for orders placed during the hospital encounter of 08/10/20  CT RENAL STONE STUDY  Narrative CLINICAL DATA:  Urinary tract calculus, follow-up nephrolithiasis in a patient with urinary frequency.  EXAM: CT ABDOMEN AND PELVIS WITHOUT CONTRAST  TECHNIQUE: Multidetector CT imaging of the abdomen and pelvis was performed following the standard protocol without IV contrast.  COMPARISON:  June 28, 2004.  FINDINGS: Lower chest: Lung bases are clear. No effusion. No consolidation. Three-vessel coronary artery disease. Heart is incompletely imaged without pericardial effusion. Normal heart size.  Hepatobiliary: Cholelithiasis. No gross biliary duct distension. The liver unremarkable on noncontrast imaging.  Pancreas: Normal, without mass, inflammation or ductal dilatation.  Spleen: Subtle density focus in the spleen likely benign though too small for definitive characterization and 9 mm.  Adrenals/Urinary Tract: Adrenal glands are normal.  Cystic renal lesions bilaterally. Cysts arise from the bilateral kidneys. Largest arises from the lower pole of the RIGHT kidney measuring 4.6 x 3.2 cm. Density measurements for renal lesions less than 20 Hounsfield units. Some of the  measurements obtained in the lower pole may be compromised by streak artifact from adjacent spinal hardware.  Kidneys show malrotation with renal pelves directed anteriorly. No hydronephrosis. No ureteral calculi.  Punctate LEFT intrarenal calculus measures approximately 2 mm in the upper pole. No perinephric stranding. Urinary bladder with smooth contours.  Stomach/Bowel: Scattered fluid-filled loops of small bowel. No signs of bowel obstruction or adjacent stranding. Stomach without dilation or adjacent stranding.  Appendix is normal.  Vascular/Lymphatic: Calcified atheromatous plaque in the abdominal aorta measuring 3.0 x 2.9 cm. Smooth contour of the IVC. There is no gastrohepatic or hepatoduodenal ligament lymphadenopathy. No retroperitoneal or mesenteric lymphadenopathy.  No pelvic sidewall lymphadenopathy.  Reproductive: Unremarkable.  Other: No ascites.  Musculoskeletal: Spinal degenerative changes with areas of lumbar spinal narrowing due to facet arthropathy, short pedicles and degenerative disc disease showing a similar appearance to prior imaging. Laminectomy changes and changes of spinal fusion in the lumbar spine with similar pattern. Spinal fusion extending from L3 through L5. Degenerative disc disease causes further narrowing at L1-2 than on previous  imaging still with considerable narrowing at the T12-L1 level also worse than on the prior study. No acute bone finding. No destructive bone process.  Degenerative changes in the hips RIGHT greater than LEFT.  IMPRESSION: 1. Nephrolithiasis without hydronephrosis or perinephric stranding. 2. Spinal degenerative changes with signs of narrowing of the central canal due to facet and degenerative disc disease along with short pedicles this appears worse particularly at the T12-L1 in the L1-L2 levels. Multilevel spinal fusion as before. Correlate with any worsening symptoms. 3. Renal cysts bilaterally. Given  compromised density measurements in the setting of spinal hardware would consider sonographic follow-up of the kidneys in 6 months to confirm simple cystic features. 4. Cholelithiasis. 5. Three-vessel coronary artery disease. 6.  atherosclerosis.  Aortic Atherosclerosis (ICD10-I70.0).   Electronically Signed By: Zetta Bills M.D. On: 08/11/2020 08:43  No results found for this or any previous visit (from the past 24 hour(s)).

## 2021-05-11 ENCOUNTER — Telehealth: Payer: Self-pay

## 2021-05-11 LAB — URINE CULTURE

## 2021-05-11 NOTE — Telephone Encounter (Signed)
-----   Message from Sydnee Levans, [place] sent at 05/11/2021  8:35 AM EST ----- Culture has only limited bacterial growth, but pt needs to complete course of macrodantin ----- Message ----- From: Interface, [facility] Lab Results In Sent: 05/09/2021   6:34 PM EST To: Sydnee Levans, PA-C

## 2021-05-11 NOTE — Telephone Encounter (Signed)
[employer]. Voiced understanding.

## 2021-05-24 ENCOUNTER — Other Ambulatory Visit: Payer: Self-pay | Admitting: Neurosurgery

## 2021-05-24 DIAGNOSIS — M48062 Spinal stenosis, lumbar region with neurogenic claudication: Secondary | ICD-10-CM

## 2021-05-24 DIAGNOSIS — M4714 Other spondylosis with myelopathy, thoracic region: Secondary | ICD-10-CM

## 2021-05-30 ENCOUNTER — Other Ambulatory Visit: Payer: Self-pay | Admitting: Internal Medicine

## 2021-06-05 ENCOUNTER — Other Ambulatory Visit: Payer: 59

## 2021-06-06 ENCOUNTER — Ambulatory Visit: Payer: 59 | Admitting: Physician Assistant

## 2021-06-08 ENCOUNTER — Ambulatory Visit
Admission: RE | Admit: 2021-06-08 | Discharge: 2021-06-08 | Disposition: A | Discharge status: Home / Self Care | Payer: 59 | Source: Ambulatory Visit | Attending: Neurosurgery | Admitting: Neurosurgery

## 2021-06-08 ENCOUNTER — Other Ambulatory Visit: Payer: Self-pay

## 2021-06-08 DIAGNOSIS — M48062 Spinal stenosis, lumbar region with neurogenic claudication: Secondary | ICD-10-CM

## 2021-06-08 DIAGNOSIS — M4714 Other spondylosis with myelopathy, thoracic region: Secondary | ICD-10-CM

## 2021-06-08 MED ORDER — ONDANSETRON HCL 4 MG/2ML IJ SOLN
4.0000 mg | Freq: Once | INTRAMUSCULAR | Status: AC | PRN
  Administered 2021-06-08: 4 mg via INTRAMUSCULAR

## 2021-06-08 MED ORDER — DIAZEPAM 5 MG PO TABS
5.0000 mg | ORAL_TABLET | Freq: Once | ORAL | Status: AC
  Administered 2021-06-08: 5 mg via ORAL

## 2021-06-08 MED ORDER — MEPERIDINE HCL 50 MG/ML IJ SOLN
50.0000 mg | Freq: Once | INTRAMUSCULAR | Status: AC | PRN
  Administered 2021-06-08: 75 mg via INTRAMUSCULAR

## 2021-06-08 MED ORDER — IOPAMIDOL (ISOVUE-M 300) INJECTION 61%
10.0000 mL | Freq: Once | INTRAMUSCULAR | Status: AC
  Administered 2021-06-08: 10 mL via INTRATHECAL

## 2021-06-08 NOTE — Discharge Instructions (Signed)

## 2021-06-11 ENCOUNTER — Other Ambulatory Visit: Payer: Self-pay

## 2021-06-11 ENCOUNTER — Encounter: Payer: Self-pay | Admitting: Urology

## 2021-06-11 ENCOUNTER — Ambulatory Visit (INDEPENDENT_AMBULATORY_CARE_PROVIDER_SITE_OTHER): Payer: 59 | Admitting: Urology

## 2021-06-11 VITALS — BP 126/71 | HR 98 | Ht 79.0 in | Wt 262.0 lb

## 2021-06-11 DIAGNOSIS — N401 Enlarged prostate with lower urinary tract symptoms: Secondary | ICD-10-CM | POA: Diagnosis not present

## 2021-06-11 DIAGNOSIS — N138 Other obstructive and reflux uropathy: Secondary | ICD-10-CM | POA: Diagnosis not present

## 2021-06-11 DIAGNOSIS — R339 Retention of urine, unspecified: Secondary | ICD-10-CM

## 2021-06-11 DIAGNOSIS — N3000 Acute cystitis without hematuria: Secondary | ICD-10-CM

## 2021-06-11 LAB — MICROSCOPIC EXAMINATION
Renal Epithel, UA: NONE SEEN /hpf
WBC, UA: >30 /hpf — AB (ref 0–5)

## 2021-06-11 LAB — URINALYSIS, ROUTINE W REFLEX MICROSCOPIC
Bilirubin, UA: NEGATIVE
Glucose, UA: NEGATIVE
Ketones, UA: NEGATIVE
Nitrite, UA: NEGATIVE
Specific Gravity, UA: 1.015 (ref 1.005–1.030)
Urobilinogen, Ur: 1 mg/dL (ref 0.2–1.0)
pH, UA: 7 (ref 5.0–7.5)

## 2021-06-11 LAB — BLADDER SCAN AMB NON-IMAGING: Scan Result: 4

## 2021-06-11 MED ORDER — TRIMETHOPRIM 100 MG PO TABS: 100.0000 mg | ORAL_TABLET | Freq: Every evening | ORAL | 11 refills | Status: DC

## 2021-06-11 MED ORDER — TAMSULOSIN HCL 0.4 MG PO CAPS: 0.4000 mg | ORAL_CAPSULE | Freq: Two times a day (BID) | ORAL | 11 refills | Status: DC

## 2021-06-11 MED ORDER — DOXYCYCLINE HYCLATE 100 MG PO CAPS: 100.0000 mg | ORAL_CAPSULE | Freq: Two times a day (BID) | ORAL | 0 refills | Status: DC

## 2021-06-11 NOTE — Progress Notes (Signed)
06/11/2021 11:33 AM   [name] 09-Apr-1946 [id number]  Referring provider: Bonnita Nasuti, MD [address] [place],  [place] [address]  Followup BPh and rUTI   HPI: [name] is a 76yo here for followup for BPh and UTI. IPSS 15 QOL 4 on flomax 0.4mg  daily. He has had 2 UTIs since last visit. For the past 3 days he has noted worsening urinary urgency, frequency, dysuria and nocturia. Nocturia 3-5x. UA today is concerning for infection.    PMH: Past Medical History:  Diagnosis Date   Arthritis    Bronchitis    hx of   COPD (chronic obstructive pulmonary disease) (HCC)    GERD (gastroesophageal reflux disease)    History of kidney stones    Hypertension    Hypothyroidism    Macular degeneration    right eye   Restless leg syndrome     Surgical History: Past Surgical History:  Procedure Laterality Date   BACK SURGERY     Fusion 2005   Graves Disease     JOINT REPLACEMENT  1970   Right knee    NECK SURGERY     POSTERIOR CERVICAL FUSION/FORAMINOTOMY  12/05/2011   Procedure: POSTERIOR CERVICAL FUSION/FORAMINOTOMY LEVEL 4;  Surgeon: Faythe Ghee, MD;  Location: [facility];  Service: Neurosurgery;  Laterality: N/A;  Posterior Cervical fixation Cervical four-Seven,Cervical Decompresson Cervical three   THORACIC DISCECTOMY N/A 01/08/2021   Procedure: Thoracic One-Two, Thoracic Two-Three, Thoracic Eleven-Twelve, Thoracic Twelve-Lumbar One Laminectomy;  Surgeon: Earnie Larsson, MD;  Location: [place];  Service: Neurosurgery;  Laterality: N/A;    Home Medications:  Allergies as of 06/11/2021       Reactions   Sulfa Antibiotics Other (See Comments)   Causes blisters and skin to peel from inside mouth        Medication List        Accurate as of June 11, 2021 11:33 AM. If you have any questions, ask your nurse or doctor.          aspirin 81 MG chewable tablet Chew 81 mg by mouth daily.   benazepril 20 MG tablet Commonly known as:  LOTENSIN Take 20 mg by mouth at bedtime.   DULoxetine 60 MG capsule Commonly known as: CYMBALTA Take 60 mg by mouth at bedtime.   HYDROcodone-acetaminophen 10-325 MG tablet Commonly known as: NORCO Take 1 tablet by mouth every 4 (four) hours as needed for severe pain ((score 7 to 10)).   levothyroxine 150 MCG tablet Commonly known as: SYNTHROID Take 150 mcg by mouth daily.   loperamide 2 MG capsule Commonly known as: IMODIUM Take 2 mg by mouth as needed for diarrhea or loose stools. One capsule with each loose stool - max of 5 doses in 24 hours.   simvastatin 80 MG tablet Commonly known as: ZOCOR Take 200 mg by mouth daily.   tamsulosin 0.4 MG Caps capsule Commonly known as: FLOMAX Take 1 capsule (0.4 mg total) by mouth in the morning and at bedtime.   vitamin B-12 1000 MCG tablet Commonly known as: CYANOCOBALAMIN Take 1,000 mcg by mouth daily.   Vitamin D3 1.25 MG (50000 UT) Tabs Take 50,000 Units by mouth every 7 (seven) days.   cholecalciferol 25 MCG (1000 UNIT) tablet Commonly known as: VITAMIN D3 Take 1,000 Units by mouth daily.        Allergies:  Allergies  Allergen Reactions   Sulfa Antibiotics Other (See Comments)    Causes blisters and skin to  peel from inside mouth    Family History: Family History  Problem Relation Age of Onset   Heart attack Mother 68   Heart disease Father     Social History:  reports that he has been smoking cigarettes. He has never used smokeless tobacco. He reports that he does not drink alcohol and does not use drugs.  ROS: All other review of systems were reviewed and are negative except what is noted above in HPI  Physical Exam: BP 126/71    Pulse 98    Ht 6\' 7"  (2.007 m)    Wt 262 lb (118.8 kg)    BMI 29.52 kg/m   Constitutional:  Alert and oriented, No acute distress. HEENT: [place] AT, moist mucus membranes.  Trachea midline, no masses. Cardiovascular: No clubbing, cyanosis, or edema. Respiratory: Normal respiratory  effort, no increased work of breathing. GI: Abdomen is soft, nontender, nondistended, no abdominal masses GU: No CVA tenderness.  Lymph: No cervical or inguinal lymphadenopathy. Skin: No rashes, bruises or suspicious lesions. Neurologic: Grossly intact, no focal deficits, moving all 4 extremities. Psychiatric: Normal mood and affect.  Laboratory Data: Lab Results  Component Value Date   WBC 8.2 01/19/2021   HGB 9.7 (L) 01/19/2021   HCT 29.1 (L) 01/19/2021   MCV 96.7 01/19/2021   PLT 218 01/19/2021    Lab Results  Component Value Date   CREATININE 0.97 01/19/2021    No results found for: PSA  No results found for: TESTOSTERONE  No results found for: HGBA1C  Urinalysis    Component Value Date/Time   APPEARANCEUR Cloudy (A) 05/09/2021 1106   GLUCOSEU Negative 05/09/2021 1106   BILIRUBINUR Negative 05/09/2021 1106   PROTEINUR 1+ (A) 05/09/2021 1106   NITRITE Positive (A) 05/09/2021 1106   LEUKOCYTESUR 3+ (A) 05/09/2021 1106    Lab Results  Component Value Date   LABMICR See below: 05/09/2021   WBCUA >30 (A) 05/09/2021   LABEPIT 0-10 05/09/2021   MUCUS Present 05/09/2021   BACTERIA Many (A) 05/09/2021    Pertinent Imaging:  No results found for this or any previous visit.  No results found for this or any previous visit.  No results found for this or any previous visit.  No results found for this or any previous visit.  No results found for this or any previous visit.  No results found for this or any previous visit.  No results found for this or any previous visit.  Results for orders placed during the hospital encounter of 08/10/20  CT RENAL STONE STUDY  Narrative CLINICAL DATA:  Urinary tract calculus, follow-up nephrolithiasis in a patient with urinary frequency.  EXAM: CT ABDOMEN AND PELVIS WITHOUT CONTRAST  TECHNIQUE: Multidetector CT imaging of the abdomen and pelvis was performed following the standard protocol without IV  contrast.  COMPARISON:  June 28, 2004.  FINDINGS: Lower chest: Lung bases are clear. No effusion. No consolidation. Three-vessel coronary artery disease. Heart is incompletely imaged without pericardial effusion. Normal heart size.  Hepatobiliary: Cholelithiasis. No gross biliary duct distension. The liver unremarkable on noncontrast imaging.  Pancreas: Normal, without mass, inflammation or ductal dilatation.  Spleen: Subtle density focus in the spleen likely benign though too small for definitive characterization and 9 mm.  Adrenals/Urinary Tract: Adrenal glands are normal.  Cystic renal lesions bilaterally. Cysts arise from the bilateral kidneys. Largest arises from the lower pole of the RIGHT kidney measuring 4.6 x 3.2 cm. Density measurements for renal lesions less than 20 Hounsfield units. Some of  the measurements obtained in the lower pole may be compromised by streak artifact from adjacent spinal hardware.  Kidneys show malrotation with renal pelves directed anteriorly. No hydronephrosis. No ureteral calculi.  Punctate LEFT intrarenal calculus measures approximately 2 mm in the upper pole. No perinephric stranding. Urinary bladder with smooth contours.  Stomach/Bowel: Scattered fluid-filled loops of small bowel. No signs of bowel obstruction or adjacent stranding. Stomach without dilation or adjacent stranding.  Appendix is normal.  Vascular/Lymphatic: Calcified atheromatous plaque in the abdominal aorta measuring 3.0 x 2.9 cm. Smooth contour of the IVC. There is no gastrohepatic or hepatoduodenal ligament lymphadenopathy. No retroperitoneal or mesenteric lymphadenopathy.  No pelvic sidewall lymphadenopathy.  Reproductive: Unremarkable.  Other: No ascites.  Musculoskeletal: Spinal degenerative changes with areas of lumbar spinal narrowing due to facet arthropathy, short pedicles and degenerative disc disease showing a similar appearance to prior imaging.  Laminectomy changes and changes of spinal fusion in the lumbar spine with similar pattern. Spinal fusion extending from L3 through L5. Degenerative disc disease causes further narrowing at L1-2 than on previous imaging still with considerable narrowing at the T12-L1 level also worse than on the prior study. No acute bone finding. No destructive bone process.  Degenerative changes in the hips RIGHT greater than LEFT.  IMPRESSION: 1. Nephrolithiasis without hydronephrosis or perinephric stranding. 2. Spinal degenerative changes with signs of narrowing of the central canal due to facet and degenerative disc disease along with short pedicles this appears worse particularly at the T12-L1 in the L1-L2 levels. Multilevel spinal fusion as before. Correlate with any worsening symptoms. 3. Renal cysts bilaterally. Given compromised density measurements in the setting of spinal hardware would consider sonographic follow-up of the kidneys in 6 months to confirm simple cystic features. 4. Cholelithiasis. 5. Three-vessel coronary artery disease. 6.  atherosclerosis.  Aortic Atherosclerosis (ICD10-I70.0).   Electronically Signed By: Zetta Bills M.D. On: 08/11/2020 08:43   Assessment & Plan:    1. Acute cystitis without hematuria -[place] for culture. Doxycycline 100mg  BID for 2 weeks then start nightly trimethoprim for prophylaxis.  - BLADDER SCAN AMB NON-IMAGING - Urinalysis, Routine w reflex microscopic  2. Benign prostatic hyperplasia with urinary obstruction -continue flomax 0.4mg  daily  3. Incomplete bladder emptying -continue flomax 0.4mg  daily   Return in about 4 weeks (around 07/09/2021).  Nicolette Bang, MD  [facility] [place]

## 2021-06-11 NOTE — Progress Notes (Signed)
post void residual =4mL 

## 2021-06-11 NOTE — Patient Instructions (Signed)

## 2021-06-13 LAB — URINE CULTURE

## 2021-06-15 ENCOUNTER — Other Ambulatory Visit: Payer: Self-pay | Admitting: Neurosurgery

## 2021-06-27 NOTE — Pre-Procedure Instructions (Signed)
? ?   Darlin Priestly Biber ? 06/27/2021  ?  ? ? Surgical Instructions ? ? Your procedure is scheduled on Fri., June 29, 2021 from 11:13AM-2:23PM. ? Report to [facility] Main Entrance "A" at 8:45 A.M., then check in with the Admitting office. ? Call this number if you have problems the morning of surgery: ? [phone] ? ? Remember: ? Do not eat or drink after midnight on March 16th ?  ? Take these medicines the morning of surgery with A SIP OF WATER: ?DULoxetine (CYMBALTA) ?Levothyroxine (SYNTHROID) ?Tamsulosin (FLOMAX) ? ?If Needed: ?HYDROcodone-acetaminophen (NORCO) ?Tamsulosin (FLOMAX) ? ?As of today, STOP taking any Aspirin (unless otherwise instructed by your surgeon) Aleve, Naproxen, Ibuprofen, Motrin, Advil, Goody's, BC's, all herbal medications, fish oil, and all vitamins. ? ?           Day of Surgery: ?Do not wear jewelry. ?Do not wear lotions, powders, perfumes/colognes, or deodorant. ?Do not shave 48 hours prior to surgery.  Men may shave face and neck. ?Do not bring valuables to the hospital. ? ?           [place] is not responsible for any belongings or valuables. ? ?Do NOT Smoke (Tobacco/Vaping)  24 hours prior to your procedure ? ?If you use a CPAP at night, you may bring your mask and machine for your overnight stay. ?  ?Contacts, glasses, hearing aids, dentures or partials may not be worn into surgery, please bring cases for these belongings ?  ?For patients admitted to the hospital, discharge time will be determined by your treatment team. ?  ?Patients discharged the day of surgery will not be allowed to drive home, and someone needs to stay with them for 24 hours. ? ?NO VISITORS WILL BE ALLOWED IN PRE-OP WHERE PATIENTS ARE PREPPED FOR SURGERY.  ONLY 1 SUPPORT PERSON MAY BE PRESENT IN THE WAITING ROOM WHILE YOU ARE IN SURGERY.  IF YOU ARE TO BE ADMITTED, ONCE YOU ARE IN YOUR ROOM YOU WILL BE ALLOWED TWO (2) VISITORS. 1 (ONE) VISITOR MAY STAY OVERNIGHT BUT MUST ARRIVE TO THE ROOM BY 8pm.  Minor  children may have two parents present. Special consideration for safety and communication needs will be reviewed on a case by case basis. ? ?Special instructions:   ? ?Oral Hygiene is also important to reduce your risk of infection.  Remember - BRUSH YOUR TEETH THE MORNING OF SURGERY WITH YOUR REGULAR TOOTHPASTE ? ? ?- Preparing For Surgery ? ?Before surgery, you can play an important role. Because skin is not sterile, your skin needs to be as free of germs as possible. You can reduce the number of germs on your skin by washing with an Antibacterial or Regular Soap before surgery.    ? ?Please follow these instructions carefully. ?  ? Shower the NIGHT BEFORE SURGERY with an Antibacterial or Regular Soap.  ?  ?Wear CLEAN PAJAMAS to bed the night before surgery ? ?Place CLEAN SHEETS on your bed the night before your surgery ? ?DO NOT SLEEP WITH PETS. ? ?Reminder: ?Wear Clean/Comfortable clothing the morning of surgery ?Do not apply any deodorants/lotions.   ?Remember to brush your teeth WITH YOUR REGULAR TOOTHPASTE. ? ?Please read over the following fact sheets that you were given.  ?  ? ? ?  ?

## 2021-06-27 NOTE — Progress Notes (Addendum)
Spoke with Janett Billow, RN at [facility] ph: [phone] and fx: [phone], who confirms the pt resides there with his wife.She states the pt is AAOx4, and he usually signs his own consents and makes decisions for himself. She also states he has had no recent Covid or Flu infections. Faxed pre-op instructions. Awaiting confirmation of receipt. ?

## 2021-06-27 NOTE — Progress Notes (Signed)
Shanda Bumps, RN confirmed receipt of pre-op instructions. ?

## 2021-06-29 ENCOUNTER — Inpatient Hospital Stay (HOSPITAL_COMMUNITY): Payer: 59 | Admitting: Anesthesiology

## 2021-06-29 ENCOUNTER — Inpatient Hospital Stay (HOSPITAL_COMMUNITY)
Admission: RE | Disposition: A | Discharge status: Home Health | Payer: Self-pay | Source: Skilled Nursing Facility | Attending: Neurosurgery

## 2021-06-29 ENCOUNTER — Encounter (HOSPITAL_COMMUNITY): Payer: Self-pay | Admitting: Neurosurgery

## 2021-06-29 ENCOUNTER — Inpatient Hospital Stay (HOSPITAL_COMMUNITY): Payer: 59

## 2021-06-29 ENCOUNTER — Other Ambulatory Visit: Payer: Self-pay

## 2021-06-29 ENCOUNTER — Inpatient Hospital Stay (HOSPITAL_COMMUNITY)
Admission: RE | Admit: 2021-06-29 | Discharge: 2021-07-02 | DRG: 460 | Disposition: A | Discharge status: Home Health | Payer: 59 | Source: Skilled Nursing Facility | Attending: Neurosurgery | Admitting: Neurosurgery

## 2021-06-29 DIAGNOSIS — D62 Acute posthemorrhagic anemia: Secondary | ICD-10-CM | POA: Diagnosis not present

## 2021-06-29 DIAGNOSIS — Z7982 Long term (current) use of aspirin: Secondary | ICD-10-CM | POA: Diagnosis not present

## 2021-06-29 DIAGNOSIS — Z87442 Personal history of urinary calculi: Secondary | ICD-10-CM | POA: Diagnosis not present

## 2021-06-29 DIAGNOSIS — J449 Chronic obstructive pulmonary disease, unspecified: Secondary | ICD-10-CM | POA: Diagnosis present

## 2021-06-29 DIAGNOSIS — Z883 Allergy status to other anti-infective agents status: Secondary | ICD-10-CM | POA: Diagnosis not present

## 2021-06-29 DIAGNOSIS — Z79899 Other long term (current) drug therapy: Secondary | ICD-10-CM | POA: Diagnosis not present

## 2021-06-29 DIAGNOSIS — M4726 Other spondylosis with radiculopathy, lumbar region: Secondary | ICD-10-CM | POA: Diagnosis present

## 2021-06-29 DIAGNOSIS — F17210 Nicotine dependence, cigarettes, uncomplicated: Secondary | ICD-10-CM | POA: Diagnosis present

## 2021-06-29 DIAGNOSIS — Z8249 Family history of ischemic heart disease and other diseases of the circulatory system: Secondary | ICD-10-CM

## 2021-06-29 DIAGNOSIS — G2581 Restless legs syndrome: Secondary | ICD-10-CM | POA: Diagnosis present

## 2021-06-29 DIAGNOSIS — E0500 Thyrotoxicosis with diffuse goiter without thyrotoxic crisis or storm: Secondary | ICD-10-CM | POA: Diagnosis present

## 2021-06-29 DIAGNOSIS — Z79890 Hormone replacement therapy (postmenopausal): Secondary | ICD-10-CM | POA: Diagnosis not present

## 2021-06-29 DIAGNOSIS — K219 Gastro-esophageal reflux disease without esophagitis: Secondary | ICD-10-CM | POA: Diagnosis present

## 2021-06-29 DIAGNOSIS — M1990 Unspecified osteoarthritis, unspecified site: Secondary | ICD-10-CM | POA: Diagnosis present

## 2021-06-29 DIAGNOSIS — H3530 Unspecified macular degeneration: Secondary | ICD-10-CM | POA: Diagnosis present

## 2021-06-29 DIAGNOSIS — I10 Essential (primary) hypertension: Secondary | ICD-10-CM | POA: Diagnosis present

## 2021-06-29 DIAGNOSIS — Z981 Arthrodesis status: Secondary | ICD-10-CM

## 2021-06-29 DIAGNOSIS — M48061 Spinal stenosis, lumbar region without neurogenic claudication: Secondary | ICD-10-CM

## 2021-06-29 DIAGNOSIS — M48062 Spinal stenosis, lumbar region with neurogenic claudication: Principal | ICD-10-CM | POA: Diagnosis present

## 2021-06-29 DIAGNOSIS — E039 Hypothyroidism, unspecified: Secondary | ICD-10-CM

## 2021-06-29 DIAGNOSIS — Z419 Encounter for procedure for purposes other than remedying health state, unspecified: Secondary | ICD-10-CM

## 2021-06-29 DIAGNOSIS — Z9889 Other specified postprocedural states: Principal | ICD-10-CM

## 2021-06-29 HISTORY — PX: LAMINECTOMY WITH POSTERIOR LATERAL ARTHRODESIS LEVEL 2: SHX6336

## 2021-06-29 LAB — PREPARE RBC (CROSSMATCH)

## 2021-06-29 LAB — POCT I-STAT, CHEM 8
BUN: 33 mg/dL — ABNORMAL HIGH (ref 8–23)
Calcium, Ion: 1.24 mmol/L (ref 1.15–1.40)
Chloride: 102 mmol/L (ref 98–111)
Creatinine, Ser: 1.5 mg/dL — ABNORMAL HIGH (ref 0.61–1.24)
Glucose, Bld: 175 mg/dL — ABNORMAL HIGH (ref 70–99)
HCT: 33 % — ABNORMAL LOW (ref 39.0–52.0)
Hemoglobin: 11.2 g/dL — ABNORMAL LOW (ref 13.0–17.0)
Potassium: 5.2 mmol/L — ABNORMAL HIGH (ref 3.5–5.1)
Sodium: 137 mmol/L (ref 135–145)
TCO2: 28 mmol/L (ref 22–32)

## 2021-06-29 LAB — CBC
HCT: 36.4 % — ABNORMAL LOW (ref 39.0–52.0)
Hemoglobin: 12 g/dL — ABNORMAL LOW (ref 13.0–17.0)
MCH: 31.4 pg (ref 26.0–34.0)
MCHC: 33 g/dL (ref 30.0–36.0)
MCV: 95.3 fL (ref 80.0–100.0)
Platelets: 156 10*3/uL (ref 150–400)
RBC: 3.82 MIL/uL — ABNORMAL LOW (ref 4.22–5.81)
RDW: 14.2 % (ref 11.5–15.5)
WBC: 7.5 10*3/uL (ref 4.0–10.5)
nRBC: 0 % (ref 0.0–0.2)

## 2021-06-29 LAB — POCT I-STAT EG7
Acid-base deficit: 3 mmol/L — ABNORMAL HIGH (ref 0.0–2.0)
Acid-base deficit: 4 mmol/L — ABNORMAL HIGH (ref 0.0–2.0)
Bicarbonate: 25.7 mmol/L (ref 20.0–28.0)
Bicarbonate: 24.5 mmol/L (ref 20.0–28.0)
Calcium, Ion: 1.31 mmol/L (ref 1.15–1.40)
Calcium, Ion: 1.2 mmol/L (ref 1.15–1.40)
HCT: 33 % — ABNORMAL LOW (ref 39.0–52.0)
HCT: 31 % — ABNORMAL LOW (ref 39.0–52.0)
Hemoglobin: 11.2 g/dL — ABNORMAL LOW (ref 13.0–17.0)
Hemoglobin: 10.5 g/dL — ABNORMAL LOW (ref 13.0–17.0)
O2 Saturation: 64 %
O2 Saturation: 54 %
Patient temperature: 36.4
Potassium: 5.2 mmol/L — ABNORMAL HIGH (ref 3.5–5.1)
Potassium: 4.9 mmol/L (ref 3.5–5.1)
Sodium: 138 mmol/L (ref 135–145)
Sodium: 138 mmol/L (ref 135–145)
TCO2: 28 mmol/L (ref 22–32)
TCO2: 26 mmol/L (ref 22–32)
pCO2, Ven: 63.8 mmHg — ABNORMAL HIGH (ref 44–60)
pCO2, Ven: 60.1 mmHg — ABNORMAL HIGH (ref 44–60)
pH, Ven: 7.209 — ABNORMAL LOW (ref 7.25–7.43)
pH, Ven: 7.218 — ABNORMAL LOW (ref 7.25–7.43)
pO2, Ven: 40 mmHg (ref 32–45)
pO2, Ven: 35 mmHg (ref 32–45)

## 2021-06-29 LAB — BASIC METABOLIC PANEL
Anion gap: 9 (ref 5–15)
BUN: 30 mg/dL — ABNORMAL HIGH (ref 8–23)
CO2: 23 mmol/L (ref 22–32)
Calcium: 8.9 mg/dL (ref 8.9–10.3)
Chloride: 104 mmol/L (ref 98–111)
Creatinine, Ser: 1.43 mg/dL — ABNORMAL HIGH (ref 0.61–1.24)
GFR, Estimated: 52 mL/min — ABNORMAL LOW (ref >60)
Glucose, Bld: 112 mg/dL — ABNORMAL HIGH (ref 70–99)
Potassium: 4.6 mmol/L (ref 3.5–5.1)
Sodium: 136 mmol/L (ref 135–145)

## 2021-06-29 LAB — SURGICAL PCR SCREEN
MRSA, PCR: NEGATIVE
Staphylococcus aureus: NEGATIVE

## 2021-06-29 LAB — ABO/RH: ABO/RH(D): O POS

## 2021-06-29 SURGERY — LAMINECTOMY WITH POSTERIOR LATERAL ARTHRODESIS LEVEL 2
Anesthesia: General | Site: Back

## 2021-06-29 MED ORDER — CHLORHEXIDINE GLUCONATE CLOTH 2 % EX PADS: 6.0000 | MEDICATED_PAD | Freq: Once | CUTANEOUS | Status: DC

## 2021-06-29 MED ORDER — PHENYLEPHRINE 40 MCG/ML (10ML) SYRINGE FOR IV PUSH (FOR BLOOD PRESSURE SUPPORT)
PREFILLED_SYRINGE | INTRAVENOUS | Status: DC | PRN
  Administered 2021-06-29: 160 ug via INTRAVENOUS

## 2021-06-29 MED ORDER — THROMBIN 20000 UNITS EX SOLR
CUTANEOUS | Status: AC
  Filled 2021-06-29: qty 20000

## 2021-06-29 MED ORDER — SODIUM CHLORIDE 0.9 % IV SOLN
0.0000 ug/min | INTRAVENOUS | Status: DC
  Administered 2021-06-29: 40 ug/min via INTRAVENOUS

## 2021-06-29 MED ORDER — BUPIVACAINE HCL (PF) 0.25 % IJ SOLN
INTRAMUSCULAR | Status: AC
  Filled 2021-06-29: qty 30

## 2021-06-29 MED ORDER — ASPIRIN 81 MG PO CHEW
81.0000 mg | CHEWABLE_TABLET | Freq: Every day | ORAL | Status: DC
  Administered 2021-06-30 – 2021-07-02 (×3): 81 mg via ORAL
  Filled 2021-06-29 (×3): qty 1

## 2021-06-29 MED ORDER — DEXAMETHASONE SODIUM PHOSPHATE 10 MG/ML IJ SOLN
INTRAMUSCULAR | Status: DC | PRN
  Administered 2021-06-29: 10 mg via INTRAVENOUS

## 2021-06-29 MED ORDER — TRIMETHOPRIM 100 MG PO TABS
100.0000 mg | ORAL_TABLET | Freq: Every day | ORAL | Status: DC
  Administered 2021-06-30 – 2021-07-02 (×3): 100 mg via ORAL
  Filled 2021-06-29 (×4): qty 1

## 2021-06-29 MED ORDER — BUPIVACAINE HCL (PF) 0.25 % IJ SOLN
INTRAMUSCULAR | Status: DC | PRN
  Administered 2021-06-29: 30 mL

## 2021-06-29 MED ORDER — DOXYCYCLINE HYCLATE 100 MG PO TABS
100.0000 mg | ORAL_TABLET | Freq: Two times a day (BID) | ORAL | Status: DC
  Administered 2021-06-29 – 2021-07-02 (×6): 100 mg via ORAL
  Filled 2021-06-29 (×6): qty 1

## 2021-06-29 MED ORDER — SODIUM CHLORIDE 0.9 % IV SOLN: 10.0000 mL/h | Freq: Once | INTRAVENOUS | Status: DC

## 2021-06-29 MED ORDER — POLYETHYLENE GLYCOL 3350 17 G PO PACK: 17.0000 g | PACK | Freq: Every day | ORAL | Status: DC | PRN

## 2021-06-29 MED ORDER — EPHEDRINE SULFATE-NACL 50-0.9 MG/10ML-% IV SOSY
PREFILLED_SYRINGE | INTRAVENOUS | Status: DC | PRN
  Administered 2021-06-29 (×3): 5 mg via INTRAVENOUS

## 2021-06-29 MED ORDER — CEFAZOLIN SODIUM-DEXTROSE 2-4 GM/100ML-% IV SOLN
2.0000 g | INTRAVENOUS | Status: AC
  Administered 2021-06-29 (×2): 2 g via INTRAVENOUS
  Filled 2021-06-29: qty 100

## 2021-06-29 MED ORDER — ONDANSETRON HCL 4 MG/2ML IJ SOLN
INTRAMUSCULAR | Status: AC
  Filled 2021-06-29: qty 2

## 2021-06-29 MED ORDER — ACETAMINOPHEN 500 MG PO TABS
1000.0000 mg | ORAL_TABLET | Freq: Once | ORAL | Status: AC
  Administered 2021-06-29: 1000 mg via ORAL
  Filled 2021-06-29: qty 2

## 2021-06-29 MED ORDER — THROMBIN 5000 UNITS EX SOLR
CUTANEOUS | Status: AC
  Filled 2021-06-29: qty 5000

## 2021-06-29 MED ORDER — MEPERIDINE HCL 25 MG/ML IJ SOLN: 6.2500 mg | INTRAMUSCULAR | Status: DC | PRN

## 2021-06-29 MED ORDER — ACETAMINOPHEN 325 MG PO TABS
650.0000 mg | ORAL_TABLET | ORAL | Status: DC | PRN
  Administered 2021-06-30 – 2021-07-02 (×3): 650 mg via ORAL
  Filled 2021-06-29 (×3): qty 2

## 2021-06-29 MED ORDER — ALBUMIN HUMAN 5 % IV SOLN: INTRAVENOUS | Status: DC | PRN

## 2021-06-29 MED ORDER — SODIUM CHLORIDE 0.9% FLUSH: 3.0000 mL | INTRAVENOUS | Status: DC | PRN

## 2021-06-29 MED ORDER — MIDAZOLAM HCL 2 MG/2ML IJ SOLN: 0.5000 mg | Freq: Once | INTRAMUSCULAR | Status: DC | PRN

## 2021-06-29 MED ORDER — HYDROMORPHONE HCL 1 MG/ML IJ SOLN: 1.0000 mg | INTRAMUSCULAR | Status: DC | PRN

## 2021-06-29 MED ORDER — VITAMIN D (ERGOCALCIFEROL) 1.25 MG (50000 UNIT) PO CAPS: 50000.0000 [IU] | ORAL_CAPSULE | ORAL | Status: DC

## 2021-06-29 MED ORDER — FENTANYL CITRATE (PF) 250 MCG/5ML IJ SOLN
INTRAMUSCULAR | Status: AC
  Filled 2021-06-29: qty 5

## 2021-06-29 MED ORDER — SUGAMMADEX SODIUM 200 MG/2ML IV SOLN
INTRAVENOUS | Status: DC | PRN
  Administered 2021-06-29: 400 mg via INTRAVENOUS

## 2021-06-29 MED ORDER — PHENYLEPHRINE HCL-NACL 20-0.9 MG/250ML-% IV SOLN
INTRAVENOUS | Status: DC | PRN
Start: 2021-06-29 — End: 2021-06-29
  Administered 2021-06-29: 20 ug/min via INTRAVENOUS

## 2021-06-29 MED ORDER — VANCOMYCIN HCL 1000 MG IV SOLR
INTRAVENOUS | Status: AC
  Filled 2021-06-29: qty 20

## 2021-06-29 MED ORDER — DEXAMETHASONE SODIUM PHOSPHATE 10 MG/ML IJ SOLN
INTRAMUSCULAR | Status: AC
  Filled 2021-06-29: qty 1

## 2021-06-29 MED ORDER — LEVOTHYROXINE SODIUM 150 MCG PO TABS
150.0000 ug | ORAL_TABLET | Freq: Every day | ORAL | Status: DC
  Administered 2021-06-30 – 2021-07-02 (×3): 150 ug via ORAL
  Filled 2021-06-29 (×4): qty 1

## 2021-06-29 MED ORDER — SODIUM CHLORIDE 0.9 % IV SOLN
10.0000 mL/h | Freq: Once | INTRAVENOUS | Status: AC
  Administered 2021-06-29: 10 mL/h via INTRAVENOUS

## 2021-06-29 MED ORDER — OXYCODONE HCL 5 MG PO TABS: 5.0000 mg | ORAL_TABLET | Freq: Once | ORAL | Status: DC | PRN

## 2021-06-29 MED ORDER — 0.9 % SODIUM CHLORIDE (POUR BTL) OPTIME
TOPICAL | Status: DC | PRN
  Administered 2021-06-29: 1000 mL

## 2021-06-29 MED ORDER — DIAZEPAM 5 MG PO TABS
5.0000 mg | ORAL_TABLET | Freq: Four times a day (QID) | ORAL | Status: DC | PRN
  Administered 2021-06-29 – 2021-07-02 (×4): 10 mg via ORAL
  Filled 2021-06-29 (×4): qty 2

## 2021-06-29 MED ORDER — TAMSULOSIN HCL 0.4 MG PO CAPS
0.4000 mg | ORAL_CAPSULE | Freq: Every day | ORAL | Status: DC
Start: 2021-06-29 — End: 2021-07-03
  Administered 2021-06-30 – 2021-07-02 (×3): 0.4 mg via ORAL
  Filled 2021-06-29 (×3): qty 1

## 2021-06-29 MED ORDER — ROCURONIUM BROMIDE 10 MG/ML (PF) SYRINGE
PREFILLED_SYRINGE | INTRAVENOUS | Status: DC | PRN
  Administered 2021-06-29: 20 mg via INTRAVENOUS
  Administered 2021-06-29: 70 mg via INTRAVENOUS
  Administered 2021-06-29: 20 mg via INTRAVENOUS
  Administered 2021-06-29: 30 mg via INTRAVENOUS

## 2021-06-29 MED ORDER — OXYCODONE HCL 5 MG PO TABS
10.0000 mg | ORAL_TABLET | ORAL | Status: DC | PRN
  Administered 2021-06-30 – 2021-07-02 (×3): 10 mg via ORAL
  Filled 2021-06-29 (×3): qty 2

## 2021-06-29 MED ORDER — ONDANSETRON HCL 4 MG/2ML IJ SOLN
INTRAMUSCULAR | Status: DC | PRN
  Administered 2021-06-29: 4 mg via INTRAVENOUS

## 2021-06-29 MED ORDER — THROMBIN 5000 UNITS EX SOLR: CUTANEOUS | Status: DC | PRN

## 2021-06-29 MED ORDER — SIMVASTATIN 20 MG PO TABS
20.0000 mg | ORAL_TABLET | Freq: Every day | ORAL | Status: DC
  Administered 2021-06-29 – 2021-07-01 (×3): 20 mg via ORAL
  Filled 2021-06-29 (×3): qty 1

## 2021-06-29 MED ORDER — FLEET ENEMA 7-19 GM/118ML RE ENEM: 1.0000 | ENEMA | Freq: Once | RECTAL | Status: DC | PRN

## 2021-06-29 MED ORDER — HYDROCODONE-ACETAMINOPHEN 10-325 MG PO TABS
1.0000 | ORAL_TABLET | ORAL | Status: DC | PRN
Start: 2021-06-29 — End: 2021-06-29

## 2021-06-29 MED ORDER — PHENOL 1.4 % MT LIQD: 1.0000 | OROMUCOSAL | Status: DC | PRN

## 2021-06-29 MED ORDER — HYDROCODONE-ACETAMINOPHEN 10-325 MG PO TABS
1.0000 | ORAL_TABLET | ORAL | Status: DC | PRN
  Filled 2021-06-29: qty 1

## 2021-06-29 MED ORDER — HYDROMORPHONE HCL 1 MG/ML IJ SOLN: 0.2500 mg | INTRAMUSCULAR | Status: DC | PRN

## 2021-06-29 MED ORDER — ONDANSETRON HCL 4 MG/2ML IJ SOLN: 4.0000 mg | Freq: Four times a day (QID) | INTRAMUSCULAR | Status: DC | PRN

## 2021-06-29 MED ORDER — LACTATED RINGERS IV SOLN: INTRAVENOUS | Status: DC

## 2021-06-29 MED ORDER — FENTANYL CITRATE (PF) 250 MCG/5ML IJ SOLN
INTRAMUSCULAR | Status: DC | PRN
  Administered 2021-06-29 (×2): 25 ug via INTRAVENOUS
  Administered 2021-06-29: 150 ug via INTRAVENOUS

## 2021-06-29 MED ORDER — PROPOFOL 10 MG/ML IV BOLUS
INTRAVENOUS | Status: DC | PRN
  Administered 2021-06-29: 120 mg via INTRAVENOUS

## 2021-06-29 MED ORDER — ONDANSETRON HCL 4 MG PO TABS: 4.0000 mg | ORAL_TABLET | Freq: Four times a day (QID) | ORAL | Status: DC | PRN

## 2021-06-29 MED ORDER — ORAL CARE MOUTH RINSE: 15.0000 mL | Freq: Once | OROMUCOSAL | Status: AC

## 2021-06-29 MED ORDER — BISACODYL 10 MG RE SUPP: 10.0000 mg | Freq: Every day | RECTAL | Status: DC | PRN

## 2021-06-29 MED ORDER — ROCURONIUM BROMIDE 10 MG/ML (PF) SYRINGE
PREFILLED_SYRINGE | INTRAVENOUS | Status: AC
  Filled 2021-06-29: qty 10

## 2021-06-29 MED ORDER — OXYCODONE HCL 5 MG/5ML PO SOLN: 5.0000 mg | Freq: Once | ORAL | Status: DC | PRN

## 2021-06-29 MED ORDER — LIDOCAINE 2% (20 MG/ML) 5 ML SYRINGE
INTRAMUSCULAR | Status: AC
  Filled 2021-06-29: qty 5

## 2021-06-29 MED ORDER — THROMBIN 20000 UNITS EX SOLR: CUTANEOUS | Status: DC | PRN

## 2021-06-29 MED ORDER — VITAMIN D 25 MCG (1000 UNIT) PO TABS
1000.0000 [IU] | ORAL_TABLET | Freq: Every day | ORAL | Status: DC
  Administered 2021-06-30 – 2021-07-02 (×3): 1000 [IU] via ORAL
  Filled 2021-06-29 (×3): qty 1

## 2021-06-29 MED ORDER — MENTHOL 3 MG MT LOZG: 1.0000 | LOZENGE | OROMUCOSAL | Status: DC | PRN

## 2021-06-29 MED ORDER — SODIUM CHLORIDE 0.9% FLUSH
3.0000 mL | Freq: Two times a day (BID) | INTRAVENOUS | Status: DC
  Administered 2021-06-29 – 2021-07-01 (×4): 3 mL via INTRAVENOUS

## 2021-06-29 MED ORDER — BENAZEPRIL HCL 20 MG PO TABS
20.0000 mg | ORAL_TABLET | Freq: Every day | ORAL | Status: DC
  Administered 2021-06-30 – 2021-07-02 (×3): 20 mg via ORAL
  Filled 2021-06-29 (×3): qty 1

## 2021-06-29 MED ORDER — CHLORHEXIDINE GLUCONATE 0.12 % MT SOLN
15.0000 mL | Freq: Once | OROMUCOSAL | Status: AC
  Administered 2021-06-29: 15 mL via OROMUCOSAL
  Filled 2021-06-29: qty 15

## 2021-06-29 MED ORDER — LIDOCAINE 2% (20 MG/ML) 5 ML SYRINGE
INTRAMUSCULAR | Status: DC | PRN
  Administered 2021-06-29: 20 mg via INTRAVENOUS

## 2021-06-29 MED ORDER — SODIUM CHLORIDE 0.9 % IV SOLN: INTRAVENOUS | Status: DC | PRN

## 2021-06-29 MED ORDER — DULOXETINE HCL 60 MG PO CPEP
60.0000 mg | ORAL_CAPSULE | Freq: Every day | ORAL | Status: DC
  Administered 2021-06-30 – 2021-07-02 (×3): 60 mg via ORAL
  Filled 2021-06-29 (×3): qty 1

## 2021-06-29 MED ORDER — PROPOFOL 10 MG/ML IV BOLUS
INTRAVENOUS | Status: AC
  Filled 2021-06-29: qty 20

## 2021-06-29 MED ORDER — SODIUM CHLORIDE 0.9 % IV SOLN: 250.0000 mL | INTRAVENOUS | Status: DC

## 2021-06-29 MED ORDER — CEFAZOLIN SODIUM-DEXTROSE 1-4 GM/50ML-% IV SOLN
1.0000 g | Freq: Three times a day (TID) | INTRAVENOUS | Status: AC
  Administered 2021-06-29 – 2021-06-30 (×2): 1 g via INTRAVENOUS
  Filled 2021-06-29 (×2): qty 50

## 2021-06-29 MED ORDER — ACETAMINOPHEN 650 MG RE SUPP: 650.0000 mg | RECTAL | Status: DC | PRN

## 2021-06-29 MED ORDER — VANCOMYCIN HCL 1000 MG IV SOLR
INTRAVENOUS | Status: DC | PRN
  Administered 2021-06-29: 1000 mg via TOPICAL

## 2021-06-29 SURGICAL SUPPLY — 64 items
ADH SKN CLS APL DERMABOND .7 (GAUZE/BANDAGES/DRESSINGS) ×1
APL SKNCLS STERI-STRIP NONHPOA (GAUZE/BANDAGES/DRESSINGS) ×1
BAG COUNTER SPONGE SURGICOUNT (BAG) ×2 IMPLANT
BAG DECANTER FOR FLEXI CONT (MISCELLANEOUS) ×1 IMPLANT
BASKET BONE COLLECTION (BASKET) ×1 IMPLANT
BENZOIN TINCTURE PRP APPL 2/3 (GAUZE/BANDAGES/DRESSINGS) ×2 IMPLANT
BLADE CLIPPER SURG (BLADE) IMPLANT
BUR CUTTER 7.0 ROUND (BURR) ×2 IMPLANT
BUR MATCHSTICK NEURO 3.0 LAGG (BURR) ×2 IMPLANT
CANISTER SUCT 3000ML PPV (MISCELLANEOUS) ×3 IMPLANT
CAP LCK SPNE (Orthopedic Implant) ×6 IMPLANT
CAP LOCK SPINE RADIUS (Orthopedic Implant) IMPLANT
CAP LOCKING (Orthopedic Implant) ×12 IMPLANT
CARTRIDGE OIL MAESTRO DRILL (MISCELLANEOUS) ×1 IMPLANT
CNTNR URN SCR LID CUP LEK RST (MISCELLANEOUS) ×1 IMPLANT
CONT SPEC 4OZ STRL OR WHT (MISCELLANEOUS) ×2
COVER BACK TABLE 60X90IN (DRAPES) ×2 IMPLANT
DECANTER SPIKE VIAL GLASS SM (MISCELLANEOUS) ×1 IMPLANT
DERMABOND ADVANCED (GAUZE/BANDAGES/DRESSINGS) ×1
DERMABOND ADVANCED .7 DNX12 (GAUZE/BANDAGES/DRESSINGS) ×1 IMPLANT
DIFFUSER DRILL AIR PNEUMATIC (MISCELLANEOUS) ×2 IMPLANT
DRAPE C-ARM 42X72 X-RAY (DRAPES) ×4 IMPLANT
DRAPE HALF SHEET 40X57 (DRAPES) IMPLANT
DRAPE LAPAROTOMY 100X72X124 (DRAPES) ×2 IMPLANT
DRAPE SURG 17X23 STRL (DRAPES) ×4 IMPLANT
DRSG OPSITE POSTOP 4X10 (GAUZE/BANDAGES/DRESSINGS) ×1 IMPLANT
DURAPREP 26ML APPLICATOR (WOUND CARE) ×2 IMPLANT
ELECT REM PT RETURN 9FT ADLT (ELECTROSURGICAL) ×2
ELECTRODE REM PT RTRN 9FT ADLT (ELECTROSURGICAL) ×1 IMPLANT
EVACUATOR 1/8 PVC DRAIN (DRAIN) ×2 IMPLANT
GAUZE 4X4 16PLY ~~LOC~~+RFID DBL (SPONGE) ×2 IMPLANT
GAUZE SPONGE 4X4 12PLY STRL (GAUZE/BANDAGES/DRESSINGS) ×1 IMPLANT
GLOVE EXAM NITRILE XL STR (GLOVE) IMPLANT
GLOVE SURG LTX SZ9 (GLOVE) ×4 IMPLANT
GOWN STRL REUS W/ TWL LRG LVL3 (GOWN DISPOSABLE) IMPLANT
GOWN STRL REUS W/ TWL XL LVL3 (GOWN DISPOSABLE) ×2 IMPLANT
GOWN STRL REUS W/TWL 2XL LVL3 (GOWN DISPOSABLE) ×1 IMPLANT
GOWN STRL REUS W/TWL LRG LVL3 (GOWN DISPOSABLE) ×8
GOWN STRL REUS W/TWL XL LVL3 (GOWN DISPOSABLE) ×4
HEMOSTAT POWDER KIT SURGIFOAM (HEMOSTASIS) ×2 IMPLANT
KIT BASIN OR (CUSTOM PROCEDURE TRAY) ×2 IMPLANT
KIT TURNOVER KIT B (KITS) ×2 IMPLANT
MILL MEDIUM DISP (BLADE) ×1 IMPLANT
NEEDLE HYPO 22GX1.5 SAFETY (NEEDLE) ×2 IMPLANT
NS IRRIG 1000ML POUR BTL (IV SOLUTION) ×2 IMPLANT
OIL CARTRIDGE MAESTRO DRILL (MISCELLANEOUS) ×2
PACK LAMINECTOMY NEURO (CUSTOM PROCEDURE TRAY) ×2 IMPLANT
PATTIES SURGICAL 1X1 (DISPOSABLE) ×2 IMPLANT
RASP 3.0MM (RASP) ×1 IMPLANT
ROD 90MM RADIUS (Rod) ×2 IMPLANT
SCREW 6.75X45MM (Screw) ×1 IMPLANT
SCREW 6.75X50MM (Screw) ×4 IMPLANT
SCREW 7.75X50MM (Screw) ×2 IMPLANT
SPONGE SURGIFOAM ABS GEL 100 (HEMOSTASIS) ×3 IMPLANT
STRIP CLOSURE SKIN 1/2X4 (GAUZE/BANDAGES/DRESSINGS) ×3 IMPLANT
SUT VIC AB 0 CT1 18XCR BRD8 (SUTURE) ×2 IMPLANT
SUT VIC AB 0 CT1 8-18 (SUTURE) ×2
SUT VIC AB 2-0 CT1 18 (SUTURE) ×2 IMPLANT
SUT VIC AB 3-0 SH 8-18 (SUTURE) ×4 IMPLANT
TOWEL GREEN STERILE (TOWEL DISPOSABLE) ×2 IMPLANT
TOWEL GREEN STERILE FF (TOWEL DISPOSABLE) ×2 IMPLANT
TRAY FOLEY MTR SLVR 16FR STAT (SET/KITS/TRAYS/PACK) ×2 IMPLANT
TUBE CONNECTING 20X1/4 (TUBING) ×1 IMPLANT
WATER STERILE IRR 1000ML POUR (IV SOLUTION) ×2 IMPLANT

## 2021-06-29 NOTE — Transfer of Care (Addendum)
Immediate Anesthesia Transfer of Care Note ? ?Patient: [name] ? ?Procedure(s) Performed: Laminectomy - Lumbar one-two, Lumbar two-three - redo with posterior lateral fusion with pedicle screws (Back) ? ?Patient Location: PACU ? ?Anesthesia Type:General ? ?Level of Consciousness: drowsy and patient cooperative ? ?Airway & Oxygen Therapy: Patient Spontanous Breathing ? ?Post-op Assessment: Report given to RN, Post -op Vital signs reviewed and stable and Post -op Vital signs reviewed and unstable, Anesthesiologist notified ? ?Post vital signs: Reviewed and stable-Neo started by CRNA, MDA- [place] at bedside, aware of pt BP, PACU RN will start unit of PRBC. ? ?Last Vitals:  ?Vitals Value Taken Time  ?BP 94/59 06/29/21 1422  ?Temp    ?Pulse 88 06/29/21 1425  ?Resp 23 06/29/21 1425  ?SpO2 95 % 06/29/21 1425  ?Vitals shown include unvalidated device data. ? ?Last Pain:  ?Vitals:  ? 06/29/21 0904  ?TempSrc:   ?PainSc: 0-No pain  ?   ? ?Patients Stated Pain Goal: 0 (06/29/21 0904) ? ?Complications: No notable events documented. ?

## 2021-06-29 NOTE — Anesthesia Preprocedure Evaluation (Signed)
Anesthesia Evaluation  ?Patient identified by MRN, date of birth, ID band ?Patient awake ? ? ? ?Reviewed: ?Allergy & Precautions, NPO status , Patient's Chart, lab work & pertinent test results ? ?History of Anesthesia Complications ?Negative for: history of anesthetic complications ? ?Airway ?Mallampati: II ? ?TM Distance: >3 FB ?Neck ROM: Full ? ? ? Dental ? ?(+) Dental Advisory Given ?  ?Pulmonary ?COPD, Current Smoker and Patient abstained from smoking.,  ?  ?breath sounds clear to auscultation ? ? ? ? ? ? Cardiovascular ?hypertension, Pt. on medications ?(-) angina ?Rhythm:Regular Rate:Normal ? ?'20 ECHO: EF 60-65%. Normal LVF, mildly increased LVH, Left ventricular diastolic Doppler parameters are consistent with impaired relaxation, normal RVF, no significant valvular abnormalities ?  ?Neuro/Psych ?negative neurological ROS ? negative psych ROS  ? GI/Hepatic ?Neg liver ROS, GERD  Controlled,  ?Endo/Other  ?Hypothyroidism  ? Renal/GU ?H/o stones  ? ?  ?Musculoskeletal ? ?(+) Arthritis ,  ? Abdominal ?  ?Peds ? Hematology ?negative hematology ROS ?(+)   ?Anesthesia Other Findings ? ? Reproductive/Obstetrics ? ?  ? ? ? ? ? ? ? ? ? ? ? ? ? ?  ?  ? ? ? ? ? ? ? ? ?Anesthesia Physical ?Anesthesia Plan ? ?ASA: 3 ? ?Anesthesia Plan: General  ? ?Post-op Pain Management: Tylenol PO (pre-op)*  ? ?Induction: Intravenous ? ?PONV Risk Score and Plan: 1 and Ondansetron, Dexamethasone and Treatment may vary due to age or medical condition ? ?Airway Management Planned: Oral ETT ? ?Additional Equipment: None ? ?Intra-op Plan:  ? ?Post-operative Plan: Extubation in OR ? ?Informed Consent: I have reviewed the patients History and Physical, chart, labs and discussed the procedure including the risks, benefits and alternatives for the proposed anesthesia with the patient or authorized representative who has indicated his/her understanding and acceptance.  ? ? ? ?Dental advisory given ? ?Plan  Discussed with: CRNA and Surgeon ? ?Anesthesia Plan Comments:   ? ? ? ? ? ? ?Anesthesia Quick Evaluation ? ?

## 2021-06-29 NOTE — Anesthesia Postprocedure Evaluation (Signed)
Anesthesia Post Note ? ?Patient: [name] ? ?Procedure(s) Performed: Laminectomy - Lumbar one-two, Lumbar two-three - redo with posterior lateral fusion with pedicle screws (Back) ? ?  ? ?Patient location during evaluation: PACU ?Anesthesia Type: General ?Level of consciousness: awake and alert, oriented and patient cooperative ?Pain management: pain level controlled ?Vital Signs Assessment: post-procedure vital signs reviewed and stable ?Respiratory status: spontaneous breathing, nonlabored ventilation and respiratory function stable ?Cardiovascular status: blood pressure returned to baseline and stable ?Postop Assessment: no apparent nausea or vomiting ?Anesthetic complications: no ? ? ?No notable events documented. ? ?Last Vitals:  ?Vitals:  ? 06/29/21 1550 06/29/21 1605  ?BP: (!) 106/52 97/63  ?Pulse: 72 86  ?Resp: 16 (!) 22  ?Temp:    ?SpO2: 96% 98%  ?  ?Last Pain:  ?Vitals:  ? 06/29/21 1550  ?TempSrc:   ?PainSc: 0-No pain  ? ? ?  ?  ?  ?  ?  ?  ? ?Tacey Dimaggio,E. Media Pizzini ? ? ? ? ?

## 2021-06-29 NOTE — Brief Op Note (Signed)
06/29/2021 ? ?2:09 PM ? ?PATIENT:  [name]  76 y.o. male ? ?PRE-OPERATIVE DIAGNOSIS:  Stenosis ? ?POST-OPERATIVE DIAGNOSIS:  Stenosis ? ?PROCEDURE:  Procedure(s): ?Laminectomy - Lumbar one-two, Lumbar two-three - redo with posterior lateral fusion with pedicle screws (N/A) ? ?SURGEON:  Surgeon(s) and Role: ?   Julio Sicks, MD - Primary ?   Barnett Abu, MD - Assisting ? ?PHYSICIAN ASSISTANT:  ? ?ASSISTANTS:   ? ?ANESTHESIA:   general ? ?EBL:  1500 mL  ? ?BLOOD ADMINISTERED:none ? ?DRAINS: (med) Hemovact drain(s) in the epidural space with  Suction Open  ? ?LOCAL MEDICATIONS USED:  MARCAINE    ? ?SPECIMEN:  No Specimen ? ?DISPOSITION OF SPECIMEN:  N/A ? ?COUNTS:  YES ? ?TOURNIQUET:  * No tourniquets in log * ? ?DICTATION: .Dragon Dictation ? ?PLAN OF CARE: Admit to inpatient  ? ?PATIENT DISPOSITION:  PACU - hemodynamically stable. ?  ?Delay start of Pharmacological VTE agent (>24hrs) due to surgical blood loss or risk of bleeding: yes ? ?

## 2021-06-29 NOTE — Anesthesia Procedure Notes (Signed)
Procedure Name: Intubation ?Date/Time: 06/29/2021 9:49 AM ?Performed by: Thelma Comp, CRNA ?Pre-anesthesia Checklist: Patient identified, Emergency Drugs available, Suction available and Patient being monitored ?Patient Re-evaluated:Patient Re-evaluated prior to induction ?Oxygen Delivery Method: Circle System Utilized ?Preoxygenation: Pre-oxygenation with 100% oxygen ?Induction Type: IV induction ?Ventilation: Mask ventilation without difficulty ?Laryngoscope Size: Mac and 4 ?Grade View: Grade I ?Tube type: Oral ?Tube size: 7.5 mm ?Number of attempts: 1 ?Airway Equipment and Method: Stylet and Oral airway ?Placement Confirmation: ETT inserted through vocal cords under direct vision, positive ETCO2 and breath sounds checked- equal and bilateral ?Secured at: 24 cm ?Tube secured with: Tape ?Dental Injury: Teeth and Oropharynx as per pre-operative assessment  ? ? ? ? ?

## 2021-06-29 NOTE — H&P (Signed)
[name] is an 76 y.o. male.   ?Chief Complaint: Numbness and weakness ?HPI: 76 year old male with history of multiple prior spinal surgeries.  Patient remotely status post L3-L5 posterior lateral fusion with instrumentation.  He is status post L1-2 decompressive laminectomy by another physician.  He is status post upper and lower thoracic decompression surgery by me approximately 8 months ago.  The patient presents now with worsening bilateral lower extremity numbness and weakness.  Work-up demonstrates evidence of severe recurrent stenosis at L1-2 and L2-3 above the level of his prior fusion at L3-4.  Patient presents now for L2 1 2 and L2-3 decompression and fusion surgery. ? ?Past Medical History:  ?Diagnosis Date  ? Arthritis   ? Bronchitis   ? hx of  ? COPD (chronic obstructive pulmonary disease) (HCC)   ? GERD (gastroesophageal reflux disease)   ? History of kidney stones   ? Hypertension   ? Hypothyroidism   ? 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 Restless leg syndrome   ? ? ?Past Surgical History:  ?Procedure Laterality Date  ? BACK SURGERY    ? Fusion 2005  ? Graves Disease    ? JOINT REPLACEMENT  1970  ? Right knee   ? NECK SURGERY    ? POSTERIOR CERVICAL FUSION/FORAMINOTOMY  12/05/2011  ? Procedure: POSTERIOR CERVICAL FUSION/FORAMINOTOMY LEVEL 4;  Surgeon: Reinaldo Meeker, MD;  Location: [facility];  Service: Neurosurgery;  Laterality: N/A;  Posterior Cervical fixation Cervical four-Seven,Cervical Decompresson Cervical three  ? THORACIC DISCECTOMY N/A 01/08/2021  ? Procedure: Thoracic One-Two, Thoracic Two-Three, Thoracic Eleven-Twelve, Thoracic Twelve-Lumbar One Laminectomy;  Surgeon: Julio Sicks, MD;  Location: [facility];  Service: Neurosurgery;  Laterality: N/A;  ? ? ?Family History  ?Problem Relation Age of Onset  ? Heart attack Mother 45  ? Heart disease Father   ? ?Social History:  reports that he has been smoking cigarettes. He has never used smokeless tobacco. He reports that he does not  drink alcohol and does not use drugs. ? ?Allergies:  ?Allergies  ?Allergen Reactions  ? Sulfa Antibiotics Other (See Comments)  ?  Causes blisters and skin to peel from inside mouth  ? ? ?Medications Prior to Admission  ?Medication Sig Dispense Refill  ? aspirin 81 MG chewable tablet Chew 81 mg by mouth daily.    ? benazepril (LOTENSIN) 20 MG tablet Take 20 mg by mouth daily.    ? Cholecalciferol (VITAMIN D3) 1.25 MG (50000 UT) TABS Take 50,000 Units by mouth every Wednesday.    ? cholecalciferol (VITAMIN D3) 25 MCG (1000 UNIT) tablet Take 1,000 Units by mouth daily.    ? doxycycline (VIBRAMYCIN) 100 MG capsule Take 1 capsule (100 mg total) by mouth 2 (two) times daily. 28 capsule 0  ? DULoxetine (CYMBALTA) 60 MG capsule Take 60 mg by mouth daily.    ? HYDROcodone-acetaminophen (NORCO) 10-325 MG tablet Take 1 tablet by mouth every 4 (four) hours as needed for severe pain ((score 7 to 10)). 30 tablet 0  ? levothyroxine (SYNTHROID) 150 MCG tablet Take 150 mcg by mouth daily before breakfast.    ? loperamide (IMODIUM) 2 MG capsule Take 2 mg by mouth as needed for diarrhea or loose stools. One capsule with each loose stool - max of 5 doses in 24 hours.    ? neomycin-bacitracin-polymyxin (NEOSPORIN) ointment Apply 1 application. topically 2 (two) times daily. Apply to left foot toes until healed    ? simvastatin (ZOCOR) 20 MG  tablet Take 20 mg by mouth at bedtime.    ? tamsulosin (FLOMAX) 0.4 MG CAPS capsule Take 1 capsule (0.4 mg total) by mouth in the morning and at bedtime. 60 capsule 11  ? trimethoprim (TRIMPEX) 100 MG tablet Take 1 tablet (100 mg total) by mouth at bedtime. 30 tablet 11  ? vitamin B-12 (CYANOCOBALAMIN) 1000 MCG tablet Take 1,000 mcg by mouth daily.    ? ? ?No results found for this or any previous visit (from the past 48 hour(s)). ?No results found. ? ?Pertinent items noted in HPI and remainder of comprehensive ROS otherwise negative. ? ?Blood pressure (!) 169/72, pulse 90, temperature 98 ?F (36.7  ?C), temperature source Oral, resp. rate 18, height 6\' 7"  (2.007 m), weight 108.9 kg, SpO2 100 %. ? ?Patient is awake and alert.  He is oriented and appropriate.  Speech is fluent.  Judgment insight are intact.  Cranial nerve function normal bilateral.  Motor examination reveals some moderate dorsiflexion and plantarflexion weakness in both lower extremities.  Sensory examination with decrease sensation pinprick light touch from L3 distally.  Reflexes are hypoactive in both lower extremities.  No evidence of long track signs.  Gait is minimal.  Posture is reasonable.  Examination head ears eyes nose and throat is unremarked.  Chest and abdomen are benign.  Extremities are free from injury or deformity. 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  Plan reexploration of laminectomy with redo decompressive laminectomy and foraminotomies followed by posterior lateral fusion utilizing pedicle screw fixation local autografting.  Risks and benefits been explained.  Patient wishes to proceed. ? ? A Kaisey Huseby ?06/29/2021, 8:50 AM ? ? ? ? ?

## 2021-06-29 NOTE — Op Note (Signed)
Date of procedure: 06/29/2021 ? ?Date of dictation: Same ? ?Service: Neurosurgery ? 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 ? ?Postoperative diagnosis: Same ? ?Procedure Name: Reexploration of L1-2 and L2-3 decompressive laminectomies with complete laminectomies and facetectomies bilaterally. ? ?Removal of old L3 pedicle screws bilaterally.  Posterior lateral arthrodesis utilizing segmental pedicle screw fixation and local autografting from L1-L3 ? ?Surgeon:Sabina Beavers A.Aarthi Uyeno, M.D. ? ?Asst. Surgeon: Danielle Dess, MD ? ?Anesthesia: General ? ?Indication: 76 year old male with bilateral lower extremity numbness paresthesias and weakness.  Patient is status post remote L3-L5 fusion with instrumentation by an outside physician.  He is status post remote L1-2 and L2-3 decompressive surgeries by Dr. Gerlene Fee remotely in the past.  He presents now with bilateral lower extremity numbness paresthesias and weakness.  He has complete myelographic blocks at L1-2 and L2-3.  He has evidence of severe recurrent stenosis with severe facet joint hypertrophy.  There is no evidence of gross instability.  Fusion from L3-L5 with well-healed but contains stainless steel instrumentation. ? ?Operative note: After induction anesthesia, patient position prone onto Wilson frame and properly padded.  Lumbar region prepped and draped sterilely.  Incision made from L1-L3.  Dissection performed bilaterally.  Retractor placed.  Fluoroscopy used.  Levels confirmed.  Previous decompressive laminectomy was dissected free.  The hypertrophic facets starting first at L2-3 on the left were resected using high-speed drill, Kerrison rongeurs and the Leksell rongeurs.  Complete facetectomies and foraminotomies were performed on the course exiting L2 and L3 nerve root.  The superior aspect of the L3 lamina was further resected and then the procedure was then repeated on the patient's right side and then subsequently  repeated at L1-2 and L2-3.  Complete decompression was obtained from T12 down to L3.  Wide foraminotomies were performed on the course exiting L1 1 L2 and L3 nerve roots.  No discectomies were performed.  Wound was then irrigated.  Gelfoam was placed topically.  Pedicle screw construct at L3-4-5 was dissected free superiorly.  The old plate was cut using a metal cutting bit and the plate was removed.  The screw was subsequently removed on both sides at L3.  The screws in L4 and L5 were left in place.  Using fluoroscopic guidance Stryker radius brand screws were placed bilaterally at L1-L2 and L3 by first drilling pilot holes, probing the pilot holes using and a screw all.  The screw hole was probed and found to be solidly within the bone.  Each screw all track was then tapped with a screw tap.  A screw tap was probed and found to be solid within bone.  6.75 mm screws were placed bilaterally at L1 and L2 7.75 mm screws were placed bilaterally at L3.  Final images reveal good position the hardware at the proper upper level with normal alignment of spine.  Wound is then irrigated.  Transverse processes were decorticated.  Morselized autograft was packed posterior laterally.  Short segment titanium rod placed over the screws from L1-L3.  Locking caps placed in the screws and locking caps and engaged with a construct under compression.  Vancomycin powder placed in deep wound space.  Wounds then closed in layers with Vicryl sutures.  Sterile dressing were applied.  No apparent complications.  Patient tolerated the procedure well and he returns to the recovery room postop. ? ?

## 2021-06-30 LAB — CBC
HCT: 31.3 % — ABNORMAL LOW (ref 39.0–52.0)
Hemoglobin: 10.5 g/dL — ABNORMAL LOW (ref 13.0–17.0)
MCH: 31.1 pg (ref 26.0–34.0)
MCHC: 33.5 g/dL (ref 30.0–36.0)
MCV: 92.6 fL (ref 80.0–100.0)
Platelets: 140 10*3/uL — ABNORMAL LOW (ref 150–400)
RBC: 3.38 MIL/uL — ABNORMAL LOW (ref 4.22–5.81)
RDW: 14.7 % (ref 11.5–15.5)
WBC: 14.9 10*3/uL — ABNORMAL HIGH (ref 4.0–10.5)
nRBC: 0 % (ref 0.0–0.2)

## 2021-06-30 LAB — BASIC METABOLIC PANEL
Anion gap: 6 (ref 5–15)
BUN: 26 mg/dL — ABNORMAL HIGH (ref 8–23)
CO2: 25 mmol/L (ref 22–32)
Calcium: 8.8 mg/dL — ABNORMAL LOW (ref 8.9–10.3)
Chloride: 104 mmol/L (ref 98–111)
Creatinine, Ser: 1.23 mg/dL (ref 0.61–1.24)
GFR, Estimated: >60 mL/min (ref >60)
Glucose, Bld: 134 mg/dL — ABNORMAL HIGH (ref 70–99)
Potassium: 5.8 mmol/L — ABNORMAL HIGH (ref 3.5–5.1)
Sodium: 135 mmol/L (ref 135–145)

## 2021-06-30 NOTE — Evaluation (Signed)
Occupational Therapy Evaluation ?Patient Details ?Name: [name] ?MRN: [id number] ?DOB: May 26, 1945 ?Today's Date: 06/30/2021 ? ? ?History of Present Illness Pt adm 3/17 for reexploration of L1-2 and L2-3 decompressive laminectomies with complete laminectomies and facetectomies bilaterally and removal of old L3 pedicle screws bilaterally.  Posterior lateral arthrodesis utilizing segmental pedicle screw fixation and local autografting from L1-L3. PMH - multiple spinal surgeries, copd, htn, arthritis, rt TKR  ? ?Clinical Impression ?  ?Patient admitted for the procedure above.  PTA he lives at a local ALF, and uses a wheelchair for primary mobility.  Deficits impacting independence are listed below.  Currently he is needing Max A for basic transfers and lower body ADL.  He plans on returning to his ALF, and HH OT may be beneficial to ensure a safe transition.   ?   ? ?Recommendations for follow up therapy are one component of a multi-disciplinary discharge planning process, led by the attending physician.  Recommendations may be updated based on patient status, additional functional criteria and insurance authorization.  ? ?Follow Up Recommendations ? Home health OT  ?  ?Assistance Recommended at Discharge    ?Patient can return home with the following A lot of help with walking and/or transfers;A little help with bathing/dressing/bathroom ? ?  ?Functional Status Assessment ? Patient has had a recent decline in their functional status and demonstrates the ability to make significant improvements in function in a reasonable and predictable amount of time.  ?Equipment Recommendations ? None recommended by OT  ?  ?Recommendations for Other Services   ? ? ?  ?Precautions / Restrictions Precautions ?Precautions: Back ?Required Braces or Orthoses: Spinal Brace ?Spinal Brace: Lumbar corset;Applied in sitting position ?Restrictions ?Weight Bearing Restrictions: No  ? ?  ? ?Mobility Bed Mobility ?Overal bed mobility:  Needs Assistance ?Bed Mobility: Sidelying to Sit ?  ?Sidelying to sit: Mod assist ?  ?  ?  ?  ?  ? ?Transfers ?Overall transfer level: Needs assistance ?  ?Transfers: Sit to/from Stand, Bed to chair/wheelchair/BSC ?Sit to Stand: Mod assist, From elevated surface ?  ?  ?Step pivot transfers: Min assist ?  ?  ?  ?  ? ?  ?Balance Overall balance assessment: Needs assistance ?Sitting-balance support: Feet supported, Bilateral upper extremity supported ?Sitting balance-Leahy Scale: Poor ?  ?Postural control: Posterior lean, Right lateral lean ?Standing balance support: Reliant on assistive device for balance ?Standing balance-Leahy Scale: Poor ?  ?  ?  ?  ?  ?  ?  ?  ?  ?  ?  ?  ?   ? ?ADL either performed or assessed with clinical judgement  ? ?ADL   ?  ?  ?Grooming: Wash/dry hands;Wash/dry face;Set up;Sitting ?  ?  ?  ?  ?  ?Upper Body Dressing : Minimal assistance;Sitting ?Upper Body Dressing Details (indicate cue type and reason): balance ?Lower Body Dressing: Maximal assistance;Sit to/from stand ?  ?Toilet Transfer: Maximal assistance;Stand-pivot;Rolling walker (2 wheels);BSC/3in1 ?Toilet Transfer Details (indicate cue type and reason): simulated to recliner.  Patient with difficulty controlling descent. ?  ?  ?  ?  ?  ?   ? ? ? ?Vision Patient Visual Report: No change from baseline ?   ?   ?Perception Perception ?Perception: Within Functional Limits ?  ?Praxis Praxis ?Praxis: Intact ?  ? ?Pertinent Vitals/Pain Pain Assessment ?Pain Assessment: No/denies pain  ? ? ? ?Hand Dominance Right ?  ?Extremity/Trunk Assessment Upper Extremity Assessment ?Upper Extremity Assessment: Generalized weakness ?  ?Lower  Extremity Assessment ?Lower Extremity Assessment: Defer to PT evaluation ?  ?Cervical / Trunk Assessment ?Cervical / Trunk Assessment: Kyphotic;Back Surgery ?  ?Communication Communication ?Communication: No difficulties ?  ?Cognition Arousal/Alertness: Awake/alert ?Behavior During Therapy: Impulsive ?Overall  Cognitive Status: No family/caregiver present to determine baseline cognitive functioning ?  ?  ?  ?  ?  ?  ?  ?  ?  ?  ?  ?  ?  ?  ?  ?  ?General Comments: Mild confusion noted, with decreased safety. ?  ?  ?General Comments   VSS on RA ? ?  ?Exercises   ?  ?Shoulder Instructions    ? ? ?Home Living Family/patient expects to be discharged to:: Assisted living ?  ?  ?  ?  ?  ?  ?  ?  ?  ?  ?  ?  ?  ?  ?Home Equipment: Conservation officer, nature (2 wheels);Rollator (4 wheels);Shower seat ?  ?Additional Comments: Pt lives in ALF with wife. ?  ? ?  ?Prior Functioning/Environment   ?  ?  ?  ?  ?  ?  ?Mobility Comments: patient has been using wheelchair in his facility ?ADLs Comments: spouse assists as needed with IADL.  Patient states he uses hip kit for lower body ADL. ?  ? ?  ?  ?OT Problem List: Decreased strength;Decreased activity tolerance;Impaired balance (sitting and/or standing);Decreased knowledge of precautions;Decreased safety awareness ?  ?   ?OT Treatment/Interventions: Self-care/ADL training;Therapeutic activities;Patient/family education;DME and/or AE instruction;Balance training  ?  ?OT Goals(Current goals can be found in the care plan section) Acute Rehab OT Goals ?Patient Stated Goal: Hoping to go home Monday ?OT Goal Formulation: With patient ?Time For Goal Achievement: 07/13/21 ?Potential to Achieve Goals: Good ?ADL Goals ?Pt Will Perform Lower Body Dressing: with min assist;sitting/lateral leans;with adaptive equipment ?Pt Will Transfer to Toilet: with min assist;stand pivot transfer;bedside commode  ?OT Frequency: Min 2X/week ?  ? ?Co-evaluation   ?  ?  ?  ?  ? ?  ?AM-PAC OT "6 Clicks" Daily Activity     ?Outcome Measure Help from another person eating meals?: None ?Help from another person taking care of personal grooming?: None ?Help from another person toileting, which includes using toliet, bedpan, or urinal?: A Lot ?Help from another person bathing (including washing, rinsing, drying)?: A Lot ?Help from  another person to put on and taking off regular upper body clothing?: A Little ?Help from another person to put on and taking off regular lower body clothing?: A Lot ?6 Click Score: 17 ?  ?End of Session Equipment Utilized During Treatment: Gait belt;Rolling walker (2 wheels) ?Nurse Communication: Mobility status ? ?Activity Tolerance: Patient tolerated treatment well ?Patient left: in chair;with call bell/phone within reach;with chair alarm set ? ?OT Visit Diagnosis: Unsteadiness on feet (R26.81);Muscle weakness (generalized) (M62.81);History of falling (Z91.81)  ?              ?Time: 4818-5909 ?OT Time Calculation (min): 24 min ?Charges:  OT General Charges ?$OT Visit: 1 Visit ?OT Evaluation ?$OT Eval Moderate Complexity: 1 Mod ?OT Treatments ?$Self Care/Home Management : 8-22 mins ? ?06/30/2021 ? ?RP, OTR/L ? ?Acute Rehabilitation Services ? ?Office:  [phone] ? ? ?Pricsilla Lindvall D Enrica Corliss ?06/30/2021, 10:01 AM ?

## 2021-06-30 NOTE — Progress Notes (Signed)
Patient ID: [name], male   DOB: 01-13-1946, 76 y.o.   MRN: [id number] ?Patient is feeling better ?Notes his legs are moving better than they have previously ?Feels a little bit lightheaded when he first gets up ?Will remove cardiac monitor mobilize further remove drain change dressing and plan for discharge in the morning. ?

## 2021-06-30 NOTE — Evaluation (Signed)
Physical Therapy Evaluation ?Patient Details ?Name: [name] ?MRN: [id number] ?DOB: 04/20/1945 ?Today's Date: 06/30/2021 ? ?History of Present Illness ? Pt adm 3/17 for reexploration of L1-2 and L2-3 decompressive laminectomies with complete laminectomies and facetectomies bilaterally and removal of old L3 pedicle screws bilaterally.  Posterior lateral arthrodesis utilizing segmental pedicle screw fixation and local autografting from L1-L3. PMH - multiple spinal surgeries, copd, htn, arthritis, rt TKR  ?Clinical Impression ? Pt presents to PT with decreased mobility due to decreased strength, decreased balance, and decreased functional activity tolerance. Pt lives at [place] with his wife and can benefit from continued PT to reach a level where he can return there.  ?   ?   ? ?Recommendations for follow up therapy are one component of a multi-disciplinary discharge planning process, led by the attending physician.  Recommendations may be updated based on patient status, additional functional criteria and insurance authorization. ? ?Follow Up Recommendations Home health PT (at ALF) ? ?  ?Assistance Recommended at Discharge Intermittent Supervision/Assistance  ?Patient can return home with the following ? A little help with walking and/or transfers ? ?  ?Equipment Recommendations None recommended by PT  ?Recommendations for Other Services ?    ?  ?Functional Status Assessment Patient has had a recent decline in their functional status and demonstrates the ability to make significant improvements in function in a reasonable and predictable amount of time.  ? ?  ?Precautions / Restrictions Precautions ?Precautions: Back ?Required Braces or Orthoses: Spinal Brace ?Spinal Brace: Lumbar corset;Applied in sitting position ?Restrictions ?Weight Bearing Restrictions: No  ? ?  ? ?Mobility ? Bed Mobility ?Overal bed mobility: Needs Assistance ?Bed Mobility: Sit to Sidelying ?  ?  ?  ?  ?Sit to sidelying: Min assist ?General  bed mobility comments: Assist to bring legs back into bed ?  ? ?Transfers ?Overall transfer level: Needs assistance ?Equipment used: Rolling walker (2 wheels) ?Transfers: Sit to/from Stand ?Sit to Stand: +2 physical assistance, Mod assist ?  ?  ?  ?  ?  ?General transfer comment: Assist to bring hips up from low recliner ?  ? ?Ambulation/Gait ?Ambulation/Gait assistance: Min assist ?Gait Distance (Feet): 15 Feet ?Assistive device: Rolling walker (2 wheels) ?Gait Pattern/deviations: Step-through pattern, Decreased step length - right, Decreased step length - left, Decreased stride length, Trunk flexed ?Gait velocity: decr ?Gait velocity interpretation: <1.31 ft/sec, indicative of household ambulator ?  ?General Gait Details: Assist for balance and support ? ?Stairs ?  ?  ?  ?  ?  ? ?Wheelchair Mobility ?  ? ?Modified Rankin (Stroke Patients Only) ?  ? ?  ? ?Balance Overall balance assessment: Needs assistance ?Sitting-balance support: Feet supported, Bilateral upper extremity supported ?Sitting balance-Leahy Scale: Poor ?Sitting balance - Comments: UE support ?  ?Standing balance support: Bilateral upper extremity supported, Reliant on assistive device for balance ?Standing balance-Leahy Scale: Poor ?Standing balance comment: walker and min assist for static standing ?  ?  ?  ?  ?  ?  ?  ?  ?  ?  ?  ?   ? ? ? ?Pertinent Vitals/Pain Pain Assessment ?Pain Assessment: No/denies pain  ? ? ?Home Living Family/patient expects to be discharged to:: Assisted living ?  ?  ?  ?  ?  ?  ?  ?  ?Home Equipment: Conservation officer, nature (2 wheels);Rollator (4 wheels);Shower seat ?Additional Comments: Pt lives in ALF with wife.  ?  ?Prior Function   ?  ?  ?  ?  ?  ?  ?  Mobility Comments: Pt uses w/c or rollator in facility ?ADLs Comments: spouse assists as needed with IADL.  Patient states he uses hip kit for lower body ADL. ?  ? ? ?Hand Dominance  ? Dominant Hand: Right ? ?  ?Extremity/Trunk Assessment  ? Upper Extremity Assessment ?Upper  Extremity Assessment: Defer to OT evaluation ?  ? ?Lower Extremity Assessment ?Lower Extremity Assessment: Generalized weakness ?  ? ?Cervical / Trunk Assessment ?Cervical / Trunk Assessment: Kyphotic;Back Surgery  ?Communication  ? Communication: No difficulties  ?Cognition Arousal/Alertness: Awake/alert ?Behavior During Therapy: [facility] for tasks assessed/performed ?Overall Cognitive Status: No family/caregiver present to determine baseline cognitive functioning ?  ?  ?  ?  ?  ?  ?  ?  ?  ?  ?  ?  ?  ?  ?  ?  ?General Comments: Decr safety awareness likely baseline ?  ?  ? ?  ?General Comments   ? ?  ?Exercises    ? ?Assessment/Plan  ?  ?PT Assessment Patient needs continued PT services  ?PT Problem List Decreased strength;Decreased balance;Decreased mobility;Decreased safety awareness;Decreased activity tolerance;Decreased knowledge of precautions ? ?   ?  ?PT Treatment Interventions DME instruction;Functional mobility training;Balance training;Patient/family education;Gait training   ? ?PT Goals (Current goals can be found in the Care Plan section)  ?Acute Rehab PT Goals ?Patient Stated Goal: Return to ALF and eventually move into house ?PT Goal Formulation: With patient ?Time For Goal Achievement: 07/14/21 ?Potential to Achieve Goals: Good ? ?  ?Frequency Min 6X/week ?  ? ? ?Co-evaluation   ?  ?  ?  ?  ? ? ?  ?AM-PAC PT "6 Clicks" Mobility  ?Outcome Measure Help needed turning from your back to your side while in a flat bed without using bedrails?: A Little ?Help needed moving from lying on your back to sitting on the side of a flat bed without using bedrails?: A Lot ?Help needed moving to and from a bed to a chair (including a wheelchair)?: A Lot ?Help needed standing up from a chair using your arms (e.g., wheelchair or bedside chair)?: Total ?Help needed to walk in hospital room?: Total ?Help needed climbing 3-5 steps with a railing? : Total ?6 Click Score: 10 ? ?  ?End of Session Equipment Utilized During  Treatment: Gait belt;Back brace ?Activity Tolerance: Patient tolerated treatment well ?Patient left: in bed;with call bell/phone within reach;with bed alarm set ?Nurse Communication: Mobility status ?PT Visit Diagnosis: Other abnormalities of gait and mobility (R26.89);Difficulty in walking, not elsewhere classified (R26.2) ?  ? ?Time: 9379-0240 ?PT Time Calculation (min) (ACUTE ONLY): 14 min ? ? ?Charges:   PT Evaluation ?$PT Eval Moderate Complexity: 1 Mod ?  ?  ?   ? ? ?[facility] PT ?Acute Rehabilitation Services ?Pager [phone] ?Office [phone] ? ? ?Shary Decamp [facility] ?06/30/2021, 12:43 PM ? ?

## 2021-06-30 NOTE — Progress Notes (Signed)
Orthopedic Tech Progress Note ?Patient Details:  ?[name] ?[birth year] ?[id number] ? ?Ortho Devices ?Type of Ortho Device: Lumbar corsett ?Ortho Device/Splint Interventions: Ordered ? Delivered to room  ? ?Karolee Stamps ?06/30/2021, 6:50 AM ? ?

## 2021-06-30 NOTE — Progress Notes (Signed)
?  Transition of Care (TOC) Screening Note ? ? ?Patient Details  ?Name: HUSNAIN ZAHNOW ?Date of Birth: 1945-11-12 ? ? ?Transition of Care (TOC) CM/SW Contact:    ?Alfredia Ferguson, LCSW ?Phone Number: ?06/30/2021, 8:40 AM ? ? ? ?Transition of Care Department [facility]) has reviewed patient and noted no immediate TOC needs pending continued medical work-up. TOC team will continue to monitor patient advancement through interdisciplinary progression rounds to support any identified discharge supports as needed. If new patient transition needs arise, please place a TOC consult or reach out to [facility] team.  ?  ?

## 2021-07-01 LAB — BASIC METABOLIC PANEL
Anion gap: 5 (ref 5–15)
BUN: 25 mg/dL — ABNORMAL HIGH (ref 8–23)
CO2: 26 mmol/L (ref 22–32)
Calcium: 8.5 mg/dL — ABNORMAL LOW (ref 8.9–10.3)
Chloride: 104 mmol/L (ref 98–111)
Creatinine, Ser: 1.13 mg/dL (ref 0.61–1.24)
GFR, Estimated: >60 mL/min (ref >60)
Glucose, Bld: 95 mg/dL (ref 70–99)
Potassium: 4.5 mmol/L (ref 3.5–5.1)
Sodium: 135 mmol/L (ref 135–145)

## 2021-07-01 MED ORDER — DIAZEPAM 5 MG PO TABS: 5.0000 mg | ORAL_TABLET | Freq: Four times a day (QID) | ORAL | 0 refills | Status: DC | PRN

## 2021-07-01 MED ORDER — HYDROCODONE-ACETAMINOPHEN 10-325 MG PO TABS: 1.0000 | ORAL_TABLET | ORAL | 0 refills | Status: DC | PRN

## 2021-07-01 NOTE — TOC Progression Note (Signed)
Transition of Care (TOC) - Progression Note  ? ? ?Patient Details  ?Name: [name] ?MRN: [id number] ?Date of Birth: [birth year] ? ?Transition of Care (TOC) CM/SW Contact  ?Alfredia Ferguson, LCSW ?Phone Number: ?07/01/2021, 12:26 PM ? ?Clinical Narrative:    ?CSW contacted by RN regarding reaching out to facility about patient transfer back to facility ([facility]). CSW reached out to charge RN at facility and was informed that they would need o re-evaluate patient prior to transfer back and she will not be available to see patient until tomorrow AM.  ? ? ?  ?  ? ?Expected Discharge Plan and Services ?  ?  ?  ?  ?  ?Expected Discharge Date: 07/01/21               ?  ?  ?  ?  ?  ?  ?  ?  ?  ?  ? ? ?Social Determinants of Health (SDOH) Interventions ?  ? ?Readmission Risk Interventions ?No flowsheet data found. ? ?

## 2021-07-01 NOTE — Progress Notes (Signed)
Physical Therapy Treatment ?Patient Details ?Name: [name] ?MRN: [id number] ?DOB: 1945/08/02 ?Today's Date: 07/01/2021 ? ? ?History of Present Illness Pt adm 3/17 for reexploration of L1-2 and L2-3 decompressive laminectomies with complete laminectomies and facetectomies bilaterally and removal of old L3 pedicle screws bilaterally.  Posterior lateral arthrodesis utilizing segmental pedicle screw fixation and local autografting from L1-L3. PMH - multiple spinal surgeries, copd, htn, arthritis, rt TKR ? ?  ?PT Comments  ? ? Patient continues to require up to mod assist to stand from an elevated surface (simulating his bed height which is quite tall--he has all his furniture on lifts to make them taller). Only able to tolerate ambulating 30 ft with RW and min assist due to fatigue. Patient concerned about how he will get back to ALF--thinks they may be able to bring a Zenaida Niece which would be taller and easier for him to get in/out of. May need to consider medical transport.  ?   ?Recommendations for follow up therapy are one component of a multi-disciplinary discharge planning process, led by the attending physician.  Recommendations may be updated based on patient status, additional functional criteria and insurance authorization. ? ?Follow Up Recommendations ? Home health PT (at ALF) ?  ?  ?Assistance Recommended at Discharge Intermittent Supervision/Assistance  ?Patient can return home with the following A lot of help with walking and/or transfers;A lot of help with bathing/dressing/bathroom;Assistance with cooking/housework;Assist for transportation ?  ?Equipment Recommendations ? None recommended by PT  ?  ?Recommendations for Other Services   ? ? ?  ?Precautions / Restrictions Precautions ?Precautions: Back ?Required Braces or Orthoses: Spinal Brace ?Spinal Brace: Lumbar corset;Applied in sitting position ?Restrictions ?Weight Bearing Restrictions: No  ?  ? ?Mobility ? Bed Mobility ?Overal bed mobility: Needs  Assistance ?Bed Mobility: Sit to Sidelying, Rolling, Sidelying to Sit ?Rolling: Supervision ?Sidelying to sit: Mod assist ?  ?  ?Sit to sidelying: Min assist ?General bed mobility comments: vc for rolling technique, +rail; mod assist to raise torso to sit even with use of rail; min assist to raise legs onto bed ?  ? ?Transfers ?Overall transfer level: Needs assistance ?Equipment used: Rolling walker (2 wheels) ?Transfers: Sit to/from Stand ?Sit to Stand: Mod assist, From elevated surface ?  ?  ?  ?  ?  ?General transfer comment: Bed elevated to simulate height at home (~8") with mod assist due to leg weakness and posterior bias ?  ? ?Ambulation/Gait ?Ambulation/Gait assistance: Min assist ?Gait Distance (Feet): 30 Feet ?Assistive device: Rolling walker (2 wheels) ?Gait Pattern/deviations: Step-through pattern, Decreased step length - right, Decreased step length - left, Decreased stride length, Trunk flexed ?Gait velocity: decr ?  ?  ?General Gait Details: initial step with partial buckle of rt knee; Assist for balance; vc for proper use of RW ? ? ?Stairs ?  ?  ?  ?  ?  ? ? ?Wheelchair Mobility ?  ? ?Modified Rankin (Stroke Patients Only) ?  ? ? ?  ?Balance Overall balance assessment: Needs assistance ?Sitting-balance support: Feet supported, Bilateral upper extremity supported ?Sitting balance-Leahy Scale: Poor ?Sitting balance - Comments: UE support ?Postural control: Posterior lean, Right lateral lean ?Standing balance support: Bilateral upper extremity supported, Reliant on assistive device for balance ?Standing balance-Leahy Scale: Poor ?Standing balance comment: walker and min assist for static standing ?  ?  ?  ?  ?  ?  ?  ?  ?  ?  ?  ?  ? ?  ?Cognition Arousal/Alertness: Awake/alert ?  Behavior During Therapy: [facility] for tasks assessed/performed ?Overall Cognitive Status: No family/caregiver present to determine baseline cognitive functioning ?  ?  ?  ?  ?  ?  ?  ?  ?  ?  ?  ?  ?  ?  ?  ?  ?General Comments: Decr  safety awareness likely baseline ?  ?  ? ?  ?Exercises   ? ?  ?General Comments General comments (skin integrity, edema, etc.): Pt questioning if he'll be able to get transported home via car. Encouraged him to have ALF bring van (higher surface) vs may need medical transport ?  ?  ? ?Pertinent Vitals/Pain Pain Assessment ?Pain Assessment: No/denies pain  ? ? ?Home Living   ?  ?  ?  ?  ?  ?  ?  ?  ?  ?   ?  ?Prior Function    ?  ?  ?   ? ?PT Goals (current goals can now be found in the care plan section) Acute Rehab PT Goals ?Patient Stated Goal: Return to ALF and eventually move into house ?Time For Goal Achievement: 07/14/21 ?Potential to Achieve Goals: Good ?Progress towards PT goals: Progressing toward goals ? ?  ?Frequency ? ? ? Min 6X/week ? ? ? ?  ?PT Plan Current plan remains appropriate  ? ? ?Co-evaluation   ?  ?  ?  ?  ? ?  ?AM-PAC PT "6 Clicks" Mobility   ?Outcome Measure ? Help needed turning from your back to your side while in a flat bed without using bedrails?: A Little ?Help needed moving from lying on your back to sitting on the side of a flat bed without using bedrails?: A Lot ?Help needed moving to and from a bed to a chair (including a wheelchair)?: A Lot ?Help needed standing up from a chair using your arms (e.g., wheelchair or bedside chair)?: Total ?Help needed to walk in hospital room?: A Little ?Help needed climbing 3-5 steps with a railing? : Total ?6 Click Score: 12 ? ?  ?End of Session Equipment Utilized During Treatment: Gait belt;Back brace ?Activity Tolerance: Patient limited by fatigue ?Patient left: in bed;with call bell/phone within reach;with bed alarm set ?Nurse Communication: Mobility status ?PT Visit Diagnosis: Other abnormalities of gait and mobility (R26.89);Difficulty in walking, not elsewhere classified (R26.2) ?  ? ? ?Time: 6389-3734 ?PT Time Calculation (min) (ACUTE ONLY): 21 min ? ?Charges:  $Gait Training: 8-22 mins          ?          ? ? ?Jerolyn Center, PT ?Acute  Rehabilitation Services  ?Pager [phone] ?Office [phone] ? ? ? ?Scherrie November Zaryah Seckel ?07/01/2021, 8:51 AM ? ?

## 2021-07-01 NOTE — Discharge Summary (Signed)
Physician Discharge Summary  ?Patient ID: ?[name] ?MRN: [id number] ?DOB/AGE: 12/31/1945 76 y.o. ? ?Admit date: 06/29/2021 ?Discharge date: 07/01/2021 ? ?Admission Diagnoses: Lumbar stenosis with radiculopathy L1-L2 3 ? ?Discharge Diagnoses: Lumbar stenosis with radiculopathy L1-L2 3.  History of lumbar fusion surgery ?Principal Problem: ?  Status post laminectomy ?Active Problems: ?  Lumbar stenosis with neurogenic claudication ? ? ?Discharged Condition: good ? ?Hospital Course: Patient was admitted to undergo surgical decompression at L1-2 and L2-3 and revision of a fusion operation from below.  He tolerated surgery well. ? ?Consults: None ? ?Significant Diagnostic Studies: None ? ?Treatments: surgery: See op note ? ?Discharge Exam: ?Blood pressure 115/63, pulse 77, temperature (!) 97.4 ?F (36.3 ?C), temperature source Oral, resp. rate 18, height 6\' 7"  (2.007 m), weight 108.9 kg, SpO2 97 %. ?Incision is clean and dry motor function is intact. ? ?Disposition: Discharge disposition: 01-Home or Self Care ? ? ? ? ? ? ?Discharge Instructions   ? ? Call MD for:  redness, tenderness, or signs of infection (pain, swelling, redness, odor or green/yellow discharge around incision site)   Complete by: As directed ?  ? Call MD for:  severe uncontrolled pain   Complete by: As directed ?  ? Call MD for:  temperature >100.4   Complete by: As directed ?  ? Diet - low sodium heart healthy   Complete by: As directed ?  ? Discharge wound care:   Complete by: As directed ?  ? Per Dr. Gearldine Bienenstock standard  ? Increase activity slowly   Complete by: As directed ?  ? ?  ? ?Allergies as of 07/01/2021   ? ?   Reactions  ? Sulfa Antibiotics Other (See Comments)  ? Causes blisters and skin to peel from inside mouth  ? ?  ? ?  ?Medication List  ?  ? ?TAKE these medications   ? ?aspirin 81 MG chewable tablet ?Chew 81 mg by mouth daily. ?  ?benazepril 20 MG tablet ?Commonly known as: LOTENSIN ?Take 20 mg by mouth daily. ?  ?diazepam 5 MG  tablet ?Commonly known as: VALIUM ?Take 1-2 tablets (5-10 mg total) by mouth every 6 (six) hours as needed for muscle spasms. ?  ?doxycycline 100 MG capsule ?Commonly known as: VIBRAMYCIN ?Take 1 capsule (100 mg total) by mouth 2 (two) times daily. ?  ?DULoxetine 60 MG capsule ?Commonly known as: CYMBALTA ?Take 60 mg by mouth daily. ?  ?HYDROcodone-acetaminophen 10-325 MG tablet ?Commonly known as: NORCO ?Take 1 tablet by mouth every 4 (four) hours as needed for moderate pain ((score 4 to 6)). ?What changed: reasons to take this ?  ?levothyroxine 150 MCG tablet ?Commonly known as: SYNTHROID ?Take 150 mcg by mouth daily before breakfast. ?  ?loperamide 2 MG capsule ?Commonly known as: IMODIUM ?Take 2 mg by mouth as needed for diarrhea or loose stools. One capsule with each loose stool - max of 5 doses in 24 hours. ?  ?neomycin-bacitracin-polymyxin ointment ?Commonly known as: NEOSPORIN ?Apply 1 application. topically 2 (two) times daily. Apply to left foot toes until healed ?  ?simvastatin 20 MG tablet ?Commonly known as: ZOCOR ?Take 20 mg by mouth at bedtime. ?  ?tamsulosin 0.4 MG Caps capsule ?Commonly known as: FLOMAX ?Take 1 capsule (0.4 mg total) by mouth in the morning and at bedtime. ?  ?trimethoprim 100 MG tablet ?Commonly known as: TRIMPEX ?Take 1 tablet (100 mg total) by mouth at bedtime. ?  ?vitamin B-12 1000 MCG tablet ?Commonly known as:  CYANOCOBALAMIN ?Take 1,000 mcg by mouth daily. ?  ?Vitamin D3 1.25 MG (50000 UT) Tabs ?Take 50,000 Units by mouth every Wednesday. ?  ?cholecalciferol 25 MCG (1000 UNIT) tablet ?Commonly known as: VITAMIN D3 ?Take 1,000 Units by mouth daily. ?  ? ?  ? ?  ?  ? ? ?  ?Durable Medical Equipment  ?(From admission, onward)  ?  ? ? ?  ? ?  Start     Ordered  ? 06/29/21 1712  DME Walker rolling  Once       ?Question:  Patient needs a walker to treat with the following condition  Answer:  Lumbar stenosis with neurogenic claudication  ? 06/29/21 1712  ? 06/29/21 1712  DME 3 n 1   Once       ? 06/29/21 1712  ? ?  ?  ? ?  ? ? ?  ?Discharge Care Instructions  ?(From admission, onward)  ?  ? ? ?  ? ?  Start     Ordered  ? 07/01/21 0000  Discharge wound care:       ?Comments: Per Dr. Gearldine Bienenstock standard  ? 07/01/21 1037  ? ?  ?  ? ?  ? ? ? ?Signed: ?Earleen Newport ?07/01/2021, 10:38 AM ? ? ?

## 2021-07-02 MED FILL — Thrombin For Soln 5000 Unit: CUTANEOUS | Qty: 5000 | Status: AC

## 2021-07-02 MED FILL — Thrombin For Soln 20000 Unit: CUTANEOUS | Qty: 1 | Status: AC

## 2021-07-02 NOTE — TOC Progression Note (Signed)
Transition of Care (TOC) - Progression Note  ? ? ?Patient Details  ?Name: LAZARUS SUDBURY ?MRN: [id number] ?Date of Birth: 02/14/1946 ? ?Transition of Care (TOC) CM/SW Contact  ?Bess Kinds, RN ?Phone Number: [phone] ?07/02/2021, 8:46 AM ? ?Clinical Narrative:    ? ?Received community message in [place] from [facility]. They are all set to follow patient for [facility] needs.  ? ?  ?  ? ?Expected Discharge Plan and Services ?  ?  ?  ?  ?  ?Expected Discharge Date: 07/01/21               ?  ?  ?  ?  ?  ?  ?  ?  ?  ?  ? ? ?Social Determinants of Health (SDOH) Interventions ?  ? ?Readmission Risk Interventions ?No flowsheet data found. ? ?

## 2021-07-02 NOTE — Discharge Summary (Signed)
?Physician Discharge Summary  ? ? ? ?Providing Compassionate, Quality Care - Together ? ? ?Patient ID: ?[name] ?MRN: [id number] ?DOB/AGE: Dec 08, 1945 76 y.o. ? ?Admit date: 06/29/2021 ?Discharge date: 07/02/2021 ? ?Admission Diagnoses: Lumbar stenosis with neurogenic claudication ? ?Discharge Diagnoses:  ?Principal Problem: ?  Status post laminectomy ?Active Problems: ?  Lumbar stenosis with neurogenic claudication ? ? ?Discharged Condition: good ? ?Hospital Course: Patient underwent decompression at L1-2 and L2-3 with revision of fusion by Dr. Jordan Likes on 06/29/2021. He was admitted following recovery from anesthesia in the PACU. His postoperative course has been uncomplicated. He has worked with both physical and occupational therapies who feel the patient is ready for discharge back to his assisted living facility with home health PT and OT. He is ambulating independently and without difficulty. He is tolerating a normal diet. He is not having any bowel or bladder dysfunction. His pain is well-controlled with oral pain medication. He is ready for discharge back to his ALF. ? ? ?Consults: rehabilitation medicine ? ?Significant Diagnostic Studies: radiology: DG Lumbar Spine 2-3 Views ? ?Result Date: 06/29/2021 ?CLINICAL DATA:  Laminectomy EXAM: LUMBAR SPINE - 2-3 VIEW COMPARISON:  CT lumbar spine 06/08/2021 FINDINGS: Intraoperative images during lumbar fusion revision. The upper segments have no posterior fusion rod at this time. Partially visualized lower segment posterior fusion rod. IMPRESSION: Intraoperative images during lumbar spine fusion revision. Electronically Signed   By: Caprice Renshaw M.D.   On: 06/29/2021 14:54  ? ?DG C-Arm 1-60 Min-No Report ? ?Result Date: 06/29/2021 ?Fluoroscopy was utilized by the requesting physician.  No radiographic interpretation.   ? ? ?Treatments: surgery: Reexploration of L1-2 and L2-3 decompressive laminectomies with complete laminectomies and facetectomies  bilaterally. ? ?Removal of old L3 pedicle screws bilaterally.  Posterior lateral arthrodesis utilizing segmental pedicle screw fixation and local autografting from L1-L3 ? ?Discharge Exam: ?Blood pressure 120/70, pulse 79, temperature 97.8 ?F (36.6 ?C), temperature source Oral, resp. rate 20, height 6\' 7"  (2.007 m), weight 108.9 kg, SpO2 98 %. ? ?Alert and oriented x 4 ?PERRLA ?CN II-XII grossly intact ?MAE, Strength and sensation intact ?Incision is covered with Honeycomb dressing and Steri Strips; Dressing is clean, dry, and intact ?  ? ?Disposition: Discharge disposition: 01-Home or Self Care ? ? ? ? ? ? ?Discharge Instructions   ? ? Call MD for:  redness, tenderness, or signs of infection (pain, swelling, redness, odor or green/yellow discharge around incision site)   Complete by: As directed ?  ? Call MD for:  severe uncontrolled pain   Complete by: As directed ?  ? Call MD for:  temperature >100.4   Complete by: As directed ?  ? Diet - low sodium heart healthy   Complete by: As directed ?  ? Discharge wound care:   Complete by: As directed ?  ? Per Dr. standard  ? Increase activity slowly   Complete by: As directed ?  ? ?  ? ?Allergies as of 07/02/2021   ? ?   Reactions  ? Sulfa Antibiotics Other (See Comments)  ? Causes blisters and skin to peel from inside mouth  ? ?  ? ?  ?Medication List  ?  ? ?TAKE these medications   ? ?aspirin 81 MG chewable tablet ?Chew 81 mg by mouth daily. ?  ?benazepril 20 MG tablet ?Commonly known as: LOTENSIN ?Take 20 mg by mouth daily. ?  ?diazepam 5 MG tablet ?Commonly known as: VALIUM ?Take 1-2 tablets (5-10 mg total) by mouth every  6 (six) hours as needed for muscle spasms. ?  ?doxycycline 100 MG capsule ?Commonly known as: VIBRAMYCIN ?Take 1 capsule (100 mg total) by mouth 2 (two) times daily. ?  ?DULoxetine 60 MG capsule ?Commonly known as: CYMBALTA ?Take 60 mg by mouth daily. ?  ?HYDROcodone-acetaminophen 10-325 MG tablet ?Commonly known as: NORCO ?Take 1 tablet by  mouth every 4 (four) hours as needed for moderate pain ((score 4 to 6)). ?What changed: reasons to take this ?  ?levothyroxine 150 MCG tablet ?Commonly known as: SYNTHROID ?Take 150 mcg by mouth daily before breakfast. ?  ?loperamide 2 MG capsule ?Commonly known as: IMODIUM ?Take 2 mg by mouth as needed for diarrhea or loose stools. One capsule with each loose stool - max of 5 doses in 24 hours. ?  ?neomycin-bacitracin-polymyxin ointment ?Commonly known as: NEOSPORIN ?Apply 1 application. topically 2 (two) times daily. Apply to left foot toes until healed ?  ?simvastatin 20 MG tablet ?Commonly known as: ZOCOR ?Take 20 mg by mouth at bedtime. ?  ?tamsulosin 0.4 MG Caps capsule ?Commonly known as: FLOMAX ?Take 1 capsule (0.4 mg total) by mouth in the morning and at bedtime. ?  ?trimethoprim 100 MG tablet ?Commonly known as: TRIMPEX ?Take 1 tablet (100 mg total) by mouth at bedtime. ?  ?vitamin B-12 1000 MCG tablet ?Commonly known as: CYANOCOBALAMIN ?Take 1,000 mcg by mouth daily. ?  ?Vitamin D3 1.25 MG (50000 UT) Tabs ?Take 50,000 Units by mouth every Wednesday. ?  ?cholecalciferol 25 MCG (1000 UNIT) tablet ?Commonly known as: VITAMIN D3 ?Take 1,000 Units by mouth daily. ?  ? ?  ? ?  ?  ? ? ?  ?Durable Medical Equipment  ?(From admission, onward)  ?  ? ? ?  ? ?  Start     Ordered  ? 06/29/21 1712  DME Walker rolling  Once       ?Question:  Patient needs a walker to treat with the following condition  Answer:  Lumbar stenosis with neurogenic claudication  ? 06/29/21 1712  ? 06/29/21 1712  DME 3 n 1  Once       ? 06/29/21 1712  ? ?  ?  ? ?  ? ? ?  ?Discharge Care Instructions  ?(From admission, onward)  ?  ? ? ?  ? ?  Start     Ordered  ? 07/01/21 0000  Discharge wound care:       ?Comments: Per Dr. Dalphine Handing standard  ? 07/01/21 1037  ? ?  ?  ? ?  ? ? Follow-up Information   ? ? [facility] Follow up.   ?Why: Your home health has been set up with [facility]. The office will contact you with  start of care information. If you have any questions please call the number listed above. ?Contact information: ?[address] ?[place] [address] ?[phone] ? ? ?  ?  ? ? Julio Sicks, MD. Nyra Capes on 07/11/2021.   ?Specialty: Neurosurgery ?Why: First post op appointment is on 07/11/2021 at 1:45 PM. ?Contact information: ?[address] ?[address] ?[place] [address] ?[phone] ? ? ?  ?  ? ?  ?  ? ?  ? ? ?Signed: ?Val Eagle, DNP, AGNP-C ?Nurse Practitioner ? ?[facility] ?[address], [place], [place] [address] ?P: [phone]    F: [phone] ? ?07/02/2021, 4:47 PM ? ?

## 2021-07-02 NOTE — TOC Initial Note (Signed)
Transition of Care (TOC) - Initial/Assessment Note  ? ? ?Patient Details  ?Name: [name] ?MRN: [id number] ?Date of Birth: 10-28-1945 ? ?Transition of Care (TOC) CM/SW Contact:    ?Andrew Sill, LCSW ?Phone Number: ?07/02/2021, 4:11 PM ? ?Clinical Narrative:                 ? ?CSW met with patient at bedside. CSW introduced self and explained role. Patient confirmed he was form [facility] and was agreeable to returning to ALF. CSW advised the facility was trying to determine if they were capable of providing the level of care need at this time. CSW informed clinical were sent to ALF for review. While meeting with the patient CSW followed up with ALF and they expressed concerns that patient in the past has refused to participate with therapy and would not get out of the bed. CSW acknowledges concerns and explained will express and discuss concerns with patient.  ALF agreeable for patient to return and advised he will need PTAR.  ? ?CSW contacted patient's daughter,[name]. CSW made aware patient will be returning to ALF today. ALF had voiced some concerns that has been shared with patient. She states understanding. All questions answered.  ? ?Updated d/c summary will be faxed to ALF. ? ? ?[name], MSW, LCSW ?Clinical Social Worker ? ? ? ? ? ?  ?Barriers to Discharge: Barriers Resolved ? ? ?Patient Goals and CMS Choice ?  ?  ?  ? ?Expected Discharge Plan and Services ?  ?  ?  ?  ?  ?Expected Discharge Date: 07/01/21               ?  ?  ?  ?  ?  ?  ?  ?  ?  ?  ? ?Prior Living Arrangements/Services ?  ?  ?  ?       ?  ?  ?  ?  ? ?Activities of Daily Living ?Home Assistive Devices/Equipment: Gilford Rile (specify type), Dentures (specify type) (Upper and lower dentures) ?ADL Screening (condition at time of admission) ?Patient's cognitive ability adequate to safely complete daily activities?: Yes ?Is the patient deaf or have difficulty hearing?: No ?Does the patient have difficulty seeing, even when  wearing glasses/contacts?: Yes ?Does the patient have difficulty concentrating, remembering, or making decisions?: No ?Patient able to express need for assistance with ADLs?: Yes ?Does the patient have difficulty dressing or bathing?: No ?Independently performs ADLs?: Yes (appropriate for developmental age) ?Does the patient have difficulty walking or climbing stairs?: Yes ?Weakness of Legs: Both ?Weakness of Arms/Hands: Both ? ?Permission Sought/Granted ?  ?  ?   ?   ?   ?   ? ?Emotional Assessment ?  ?  ?  ?  ?  ?  ? ?Admission diagnosis:  Status post laminectomy [Z98.890] ?Lumbar stenosis with neurogenic claudication [M48.062] ?Patient Active Problem List  ? Diagnosis Date Noted  ? Status post laminectomy 06/29/2021  ? Lumbar stenosis with neurogenic claudication 06/29/2021  ? Incomplete bladder emptying 05/09/2021  ? Myelopathy concurrent with and due to spinal stenosis of thoracic region [facility]) 01/08/2021  ? Acute cystitis without hematuria 08/18/2020  ? Nocturia 08/18/2020  ? Kidney stones 07/04/2020  ? Benign prostatic hyperplasia with urinary obstruction 07/04/2020  ? Chest pain, rule out acute myocardial infarction 01/31/2019  ? COPD with acute exacerbation ([place]) 01/31/2019  ? Acute respiratory failure with hypoxia ([place]) 01/31/2019  ? HTN (hypertension) 01/31/2019  ? ?PCP:  [name]  P, MD ?Pharmacy:   ?[facility] [address] ?[place] [address] ?[address] ?[place] [address] ?Phone: [phone] Fax: [phone] ? ? ? ? ?Social Determinants of Health (SDOH) Interventions ?  ? ?Readmission Risk Interventions ?No flowsheet data found. ? ? ?

## 2021-07-02 NOTE — Progress Notes (Signed)
Physical Therapy Treatment ?Patient Details ?Name: [name] ?MRN: [id number] ?DOB: 1945-09-26 ?Today's Date: 07/02/2021 ? ? ?History of Present Illness Pt adm 3/17 for reexploration of L1-2 and L2-3 decompressive laminectomies with complete laminectomies and facetectomies bilaterally and removal of old L3 pedicle screws bilaterally.  Posterior lateral arthrodesis utilizing segmental pedicle screw fixation and local autografting from L1-L3. PMH - multiple spinal surgeries, copd, htn, arthritis, rt TKR ? ?  ?PT Comments  ? ? Focus of session today was functional bed mobility, STS, short distance gait, and repeated scooting. The patient tolerated well but was limited secondary to his fatigue.  Pt. Shows overall improvement with bed mobility ability and standing balance. Overall strength, dynamic and static balance, functional mobility, and endurance are still limiting function. Pt. Would benefit from skilled PT to continue to address his functional mobility, balance, and gait deficits. The patient states that he will have increased support and continued therapy when returning to the ALF. Plan and discharge setting remains unchanged. Pt to follow acutely as appropriate.  ?   ?Recommendations for follow up therapy are one component of a multi-disciplinary discharge planning process, led by the attending physician.  Recommendations may be updated based on patient status, additional functional criteria and insurance authorization. ? ?Follow Up Recommendations ? Home health PT (at ALF) ?  ?  ?Assistance Recommended at Discharge Intermittent Supervision/Assistance  ?Patient can return home with the following A lot of help with walking and/or transfers;A lot of help with bathing/dressing/bathroom;Assistance with cooking/housework;Assist for transportation ?  ?Equipment Recommendations ? None recommended by PT  ?  ?Recommendations for Other Services   ? ? ?  ?Precautions / Restrictions Precautions ?Precautions:  Back ?Required Braces or Orthoses: Spinal Brace ?Spinal Brace: Lumbar corset;Applied in sitting position ?Restrictions ?Weight Bearing Restrictions: No  ?  ? ?Mobility ? Bed Mobility ?Overal bed mobility: Needs Assistance ?Bed Mobility: Sit to Sidelying, Rolling, Sidelying to Sit ?Rolling: Min assist ?Sidelying to sit: Mod assist ?  ?  ?Sit to sidelying: Mod assist ?General bed mobility comments: min A for roll for sequencing cues, log roll, Mod A for sid<>sit for truncal and LE management, required increased A at end of session secondary to fatigue. Performed lateral scooting x3 to get heigher in bed ?  ? ?Transfers ?Overall transfer level: Needs assistance ?Equipment used: Rolling walker (2 wheels) ?Transfers: Sit to/from Stand ?Sit to Stand: Mod assist, From elevated surface ?  ?  ?  ?  ?  ?General transfer comment: Mod A for STS for power up, steady, and cues. Attempted 2nd STS x2 and patient but he was unable secondary to fatigue. ?  ? ?Ambulation/Gait ?  ?Gait Distance (Feet): 15 Feet ?Assistive device: Rolling walker (2 wheels) ?Gait Pattern/deviations: Step-through pattern, Decreased step length - right, Decreased step length - left, Decreased stride length, Trunk flexed, Narrow base of support ?Gait velocity: decr ?  ?  ?General Gait Details: Small steps, A for steady, and quick to fatigue. Once returned to sit pt complains of d/n, this resolved once he laid down. ? ? ?Stairs ?  ?  ?  ?  ?  ? ? ?Wheelchair Mobility ?  ? ?Modified Rankin (Stroke Patients Only) ?  ? ? ?  ?Balance Overall balance assessment: Needs assistance ?Sitting-balance support: Feet supported, No upper extremity supported ?Sitting balance-Leahy Scale: Fair ?Sitting balance - Comments: Able to sustain while donning brace. Had one episode of posterior LOB, able to correct with mod A from SPT ?Postural control: Posterior lean ?  Standing balance support: Bilateral upper extremity supported, Reliant on assistive device for balance ?Standing  balance-Leahy Scale: Poor ?Standing balance comment: Requires RW for all static and dynamic balance. Min A to maintain standing balance. ?  ?  ?  ?  ?  ?  ?  ?  ?  ?  ?  ?  ? ?  ?Cognition Arousal/Alertness: Awake/alert ?Behavior During Therapy: [facility] for tasks assessed/performed ?Overall Cognitive Status: No family/caregiver present to determine baseline cognitive functioning ?  ?  ?  ?  ?  ?  ?  ?  ?  ?  ?  ?  ?  ?  ?  ?  ?General Comments: Decreased saftey awareness and slight temporal confusion ?  ?  ? ?  ?Exercises General Exercises - Lower Extremity ?Quad Sets: Strengthening, Both, 5 reps ?Heel Slides: AROM, Both, 10 reps (AAROM last 5 on R side) ? ?  ?General Comments General comments (skin integrity, edema, etc.): Pt. complains of significant fatigue before session and quick time to fatigue during session. He states that he was given "pain medication" this am and it left him feeling very lethargic. ?  ?  ? ?Pertinent Vitals/Pain Pain Assessment ?Pain Assessment: Faces ?Faces Pain Scale: Hurts little more ?Pain Location: Back and R flank ?Pain Descriptors / Indicators: Aching, Grimacing, Guarding ?Pain Intervention(s): Limited activity within patient's tolerance, Monitored during session, Repositioned  ? ? ?Home Living   ?  ?  ?  ?  ?  ?  ?  ?  ?  ?   ?  ?Prior Function    ?  ?  ?   ? ?PT Goals (current goals can now be found in the care plan section) Acute Rehab PT Goals ?Patient Stated Goal: Return to ALF and eventually move into house ?PT Goal Formulation: With patient ?Time For Goal Achievement: 07/14/21 ?Potential to Achieve Goals: Good ?Progress towards PT goals: Progressing toward goals ? ?  ?Frequency ? ? ? Min 6X/week ? ? ? ?  ?PT Plan Current plan remains appropriate  ? ? ?Co-evaluation   ?  ?  ?  ?  ? ?  ?AM-PAC PT "6 Clicks" Mobility   ?Outcome Measure ? Help needed turning from your back to your side while in a flat bed without using bedrails?: A Little ?Help needed moving from lying on your back  to sitting on the side of a flat bed without using bedrails?: A Lot ?Help needed moving to and from a bed to a chair (including a wheelchair)?: A Lot ?Help needed standing up from a chair using your arms (e.g., wheelchair or bedside chair)?: A Lot ?Help needed to walk in hospital room?: A Little ?Help needed climbing 3-5 steps with a railing? : Total ?6 Click Score: 13 ? ?  ?End of Session Equipment Utilized During Treatment: Gait belt;Back brace ?Activity Tolerance: Patient limited by fatigue ?Patient left: in bed;with call bell/phone within reach;with bed alarm set ?Nurse Communication: Mobility status ?PT Visit Diagnosis: Other abnormalities of gait and mobility (R26.89);Difficulty in walking, not elsewhere classified (R26.2) ?  ? ? ?Time: 1010-1032 ?PT Time Calculation (min) (ACUTE ONLY): 22 min ? ?Charges:  $Therapeutic Activity: 8-22 mins          ?          ? ?Lorie Apley, SPT ?Acute Rehab Services ? ? ? ?Lorie Apley ?07/02/2021, 10:56 AM ? ?

## 2021-07-02 NOTE — [place] FL2 (Addendum)
?[place] MEDICAID FL2 LEVEL OF CARE SCREENING TOOL  ?  ? ?IDENTIFICATION  ?Patient Name: ?[name] Birthdate: [birth year] Sex: male Admission Date (Current Location): ?06/29/2021  ?[place] Number: ? [place] ?  Facility and Address:  ?[facility], [address] [place], [place] [address] ?     Provider Number: ?[id number]  ?Attending Physician Name and Address:  ?Julio Sicks, MD ? Relative Name and Phone Number:  ?  ?   ?Current Level of Care: ?Hospital Recommended Level of Care: ?Assisted Living Facility Prior Approval Number: ?  ? ?Date Approved/Denied: ?  PASRR Number: ?  ? ?Discharge Plan: ?Other (Comment) (ALF) ?  ? ?Current Diagnoses: ?Patient Active Problem List  ? Diagnosis Date Noted  ? Status post laminectomy 06/29/2021  ? Lumbar stenosis with neurogenic claudication 06/29/2021  ? Incomplete bladder emptying 05/09/2021  ? Myelopathy concurrent with and due to spinal stenosis of thoracic region [facility]) 01/08/2021  ? Acute cystitis without hematuria 08/18/2020  ? Nocturia 08/18/2020  ? Kidney stones 07/04/2020  ? Benign prostatic hyperplasia with urinary obstruction 07/04/2020  ? Chest pain, rule out acute myocardial infarction 01/31/2019  ? COPD with acute exacerbation (HCC) 01/31/2019  ? Acute respiratory failure with hypoxia (HCC) 01/31/2019  ? HTN (hypertension) 01/31/2019  ? ? ?Orientation RESPIRATION BLADDER Height & Weight   ?  ?Self, Time, Situation, Place ? Normal Continent Weight: 240 lb (108.9 kg) ?Height:  6\' 7"  (200.7 cm)  ?BEHAVIORAL SYMPTOMS/MOOD NEUROLOGICAL BOWEL NUTRITION STATUS  ?    Continent Diet (low sodium -)  ?AMBULATORY STATUS COMMUNICATION OF NEEDS Skin   ?Supervision Verbally Surgical wounds (closed incision back) ?  ?  ?  ?    ?     ?     ? ? ?Personal Care Assistance Level of Assistance  ?Bathing, Feeding, Total care Bathing Assistance: Limited assistance ?Feeding assistance: Independent ?  ?Total Care Assistance: Limited assistance   ? ?Functional Limitations Info  ?Sight, Hearing, Speech Sight Info: Adequate ?Hearing Info: Adequate ?Speech Info: Adequate  ? ? ?SPECIAL CARE FACTORS FREQUENCY  ?PT (By licensed PT), OT (By licensed OT)   ?  ?PT Frequency: 5x per week ?OT Frequency: 5x per week ?  ?  ?  ?   ? ? ?Contractures Contractures Info: Not present  ? ? ?Additional Factors Info  ?Code Status, Allergies Code Status Info: FULL ?Allergies Info: Sulfa Antibiotics ?  ?  ?  ?   ? ?Current Medications (07/02/2021):  This is the current hospital active medication list ?Current Facility-Administered Medications  ?Medication Dose Route Frequency Provider Last Rate Last Admin  ? 0.9 %  sodium chloride infusion  10 mL/hr Intravenous Once 07/04/2021, MD      ? 0.9 %  sodium chloride infusion  250 mL Intravenous Continuous Julio Sicks, MD   Stopped at 06/29/21 1742  ? acetaminophen (TYLENOL) tablet 650 mg  650 mg Oral Q4H PRN 07/01/21, MD   650 mg at 07/02/21 0336  ? Or  ? acetaminophen (TYLENOL) suppository 650 mg  650 mg Rectal Q4H PRN 07/04/21, MD      ? aspirin chewable tablet 81 mg  81 mg Oral Daily Julio Sicks, MD   81 mg at 07/02/21 07/04/21  ? benazepril (LOTENSIN) tablet 20 mg  20 mg Oral Daily 8119, MD   20 mg at 07/02/21 07/04/21  ? bisacodyl (DULCOLAX) suppository 10 mg  10 mg Rectal Daily PRN 1478, MD      ?  cholecalciferol (VITAMIN D3) tablet 1,000 Units  1,000 Units Oral Daily Julio SicksPool, Henry, MD   1,000 Units at 07/02/21 [id number]  ? diazepam (VALIUM) tablet 5-10 mg  5-10 mg Oral Q6H PRN Julio SicksPool, Henry, MD   10 mg at 07/02/21 [id number]  ? doxycycline (VIBRA-TABS) tablet 100 mg  100 mg Oral BID Julio SicksPool, Henry, MD   100 mg at 07/02/21 [id number]  ? DULoxetine (CYMBALTA) DR capsule 60 mg  60 mg Oral Daily Julio SicksPool, Henry, MD   60 mg at 07/02/21 [id number]  ? HYDROcodone-acetaminophen (NORCO) 10-325 MG per tablet 1 tablet  1 tablet Oral Q4H PRN Julio SicksPool, Henry, MD      ? HYDROmorphone (DILAUDID) injection 1 mg  1 mg Intravenous Q2H PRN Julio SicksPool, Henry, MD      ?  levothyroxine (SYNTHROID) tablet 150 mcg  150 mcg Oral QAC breakfast Julio SicksPool, Henry, MD   150 mcg at 07/02/21 0557  ? menthol-cetylpyridinium (CEPACOL) lozenge 3 mg  1 lozenge Oral PRN Julio SicksPool, Henry, MD      ? Or  ? phenol (CHLORASEPTIC) mouth spray 1 spray  1 spray Mouth/Throat PRN Julio SicksPool, Henry, MD      ? ondansetron (ZOFRAN) tablet 4 mg  4 mg Oral Q6H PRN Julio SicksPool, Henry, MD      ? Or  ? ondansetron (ZOFRAN) injection 4 mg  4 mg Intravenous Q6H PRN Julio SicksPool, Henry, MD      ? oxyCODONE (Oxy IR/ROXICODONE) immediate release tablet 10 mg  10 mg Oral Q3H PRN Julio SicksPool, Henry, MD   10 mg at 07/02/21 0336  ? polyethylene glycol (MIRALAX / GLYCOLAX) packet 17 g  17 g Oral Daily PRN Julio SicksPool, Henry, MD      ? simvastatin (ZOCOR) tablet 20 mg  20 mg Oral QHS Julio SicksPool, Henry, MD   20 mg at 07/01/21 2125  ? sodium chloride flush (NS) 0.9 % injection 3 mL  3 mL Intravenous Q12H Julio SicksPool, Henry, MD   3 mL at 07/01/21 2127  ? sodium chloride flush (NS) 0.9 % injection 3 mL  3 mL Intravenous PRN Julio SicksPool, Henry, MD      ? sodium phosphate (FLEET) 7-19 GM/118ML enema 1 enema  1 enema Rectal Once PRN Julio SicksPool, Henry, MD      ? tamsulosin [facility](FLOMAX) capsule 0.4 mg  0.4 mg Oral QPC supper Julio SicksPool, Henry, MD   0.4 mg at 07/01/21 2129  ? trimethoprim (TRIMPEX) tablet 100 mg  100 mg Oral Daily Julio SicksPool, Henry, MD   100 mg at 07/02/21 0825  ? [START ON 07/04/2021] Vitamin D (Ergocalciferol) (DRISDOL) capsule 50,000 Units  50,000 Units Oral Q Karren CobbleWed Pool, Henry, MD      ? ? ? ?Discharge Medications: ?Please see discharge summary for a list of discharge medications. ? ?aspirin 81 MG chewable tablet ?Chew 81 mg by mouth daily. ?   ?benazepril 20 MG tablet ?Commonly known as: LOTENSIN ?Take 20 mg by mouth daily. ?   ?diazepam 5 MG tablet ?Commonly known as: VALIUM ?Take 1-2 tablets (5-10 mg total) by mouth every 6 (six) hours as needed for muscle spasms. ?   ?doxycycline 100 MG capsule ?Commonly known as: VIBRAMYCIN ?Take 1 capsule (100 mg total) by mouth 2 (two) times daily. ?   ?DULoxetine  60 MG capsule ?Commonly known as: CYMBALTA ?Take 60 mg by mouth daily. ?   ?HYDROcodone-acetaminophen 10-325 MG tablet ?Commonly known as: NORCO ?Take 1 tablet by mouth every 4 (four) hours as needed for moderate pain ((score 4 to 6)). ?What changed: reasons  to take this ?   ?levothyroxine 150 MCG tablet ?Commonly known as: SYNTHROID ?Take 150 mcg by mouth daily before breakfast. ?   ?loperamide 2 MG capsule ?Commonly known as: IMODIUM ?Take 2 mg by mouth as needed for diarrhea or loose stools. One capsule with each loose stool - max of 5 doses in 24 hours. ?   ?neomycin-bacitracin-polymyxin ointment ?Commonly known as: NEOSPORIN ?Apply 1 application. topically 2 (two) times daily. Apply to left foot toes until healed ?   ?simvastatin 20 MG tablet ?Commonly known as: ZOCOR ?Take 20 mg by mouth at bedtime. ?   ?tamsulosin 0.4 MG Caps capsule ?Commonly known as: FLOMAX ?Take 1 capsule (0.4 mg total) by mouth in the morning and at bedtime. ?   ?trimethoprim 100 MG tablet ?Commonly known as: TRIMPEX ?Take 1 tablet (100 mg total) by mouth at bedtime. ?   ?vitamin B-12 1000 MCG tablet ?Commonly known as: CYANOCOBALAMIN ?Take 1,000 mcg by mouth daily. ?   ?Vitamin D3 1.25 MG (50000 UT) Tabs ?Take 50,000 Units by mouth every Wednesday. ?   ?cholecalciferol 25 MCG (1000 UNIT) tablet ?Commonly known as: VITAMIN D3 ?Take 1,000 Units by mouth daily.  ? ? ?Relevant Imaging Results: ? ?Relevant Lab Results: ? ? ?Additional Information ?SSN [id number] ? ?Eduard Roux, LCSW ? ? ? ? ?

## 2021-07-02 NOTE — TOC Transition Note (Signed)
Transition of Care (TOC) - CM/SW Discharge Note ? ? ?Patient Details  ?Name: [name] ?MRN: [id number] ?Date of Birth: 1945-10-16 ? ?Transition of Care (TOC) CM/SW Contact:  ?Vinie Sill, LCSW ?Phone Number: ?07/02/2021, 4:48 PM ? ? ?Clinical Narrative:    ? ?Patient will Discharge to: [facility] ?Discharge Date: 07/02/2021 ?Family Notified: daughter ?Transport By: Corey Harold ? ?Per MD patient is ready for discharge. RN, patient, and facility notified of discharge. Discharge Summary sent to facility. RN given number for [phone]. Ambulance transport requested for patient.  ? ?Clinical Social Worker signing off. ? ?Thurmond Butts, MSW, LCSW ?Clinical Social Worker ? ? ? ? ?Final next level of care: Assisted Living ?Barriers to Discharge: Barriers Resolved ? ? ?Patient Goals and CMS Choice ?  ?  ?  ? ?Discharge Placement ?  ?           ?Patient chooses bed at:  ([facility]) ?Patient to be transferred to facility by: [facility] ?Name of family member notified: daughter ?Patient and family notified of of transfer: 07/02/21 ? ?Discharge Plan and Services ?  ?  ?           ?  ?  ?  ?  ?  ?  ?  ?  ?  ?  ? ?Social Determinants of Health (SDOH) Interventions ?  ? ? ?Readmission Risk Interventions ?No flowsheet data found. ? ? ? ? ?

## 2021-07-02 NOTE — Progress Notes (Signed)
Occupational Therapy Treatment ?Patient Details ?Name: [name] ?MRN: [id number] ?DOB: 16-Sep-1945 ?Today's Date: 07/02/2021 ? ? ?History of present illness Pt adm 3/17 for reexploration of L1-2 and L2-3 decompressive laminectomies with complete laminectomies and facetectomies bilaterally and removal of old L3 pedicle screws bilaterally.  Posterior lateral arthrodesis utilizing segmental pedicle screw fixation and local autografting from L1-L3. PMH - multiple spinal surgeries, copd, htn, arthritis, rt TKR ?  ?OT comments ? Patient received in supine and agreeable to OT session. Patient required verbal cues for getting to sidelying and mod assist to get to EOB.  Patient was mod assist to stand and to transfer to recliner.  Patient educated on use of AE for LB dressing with reacher and sock aide with mod assist.  Patient was mod assist +2 to stand from recliner due to lower surface and fatigue. Patient returned to supine with mod assist. Acute OT to continue to follow.   ? ?Recommendations for follow up therapy are one component of a multi-disciplinary discharge planning process, led by the attending physician.  Recommendations may be updated based on patient status, additional functional criteria and insurance authorization. ?   ?Follow Up Recommendations ? Home health OT  ?  ?Assistance Recommended at Discharge Intermittent Supervision/Assistance  ?Patient can return home with the following ? A lot of help with walking and/or transfers;A little help with bathing/dressing/bathroom ?  ?Equipment Recommendations ? None recommended by OT  ?  ?Recommendations for Other Services   ? ?  ?Precautions / Restrictions Precautions ?Precautions: Back ?Required Braces or Orthoses: Spinal Brace ?Spinal Brace: Lumbar corset;Applied in sitting position ?Restrictions ?Weight Bearing Restrictions: No  ? ? ?  ? ?Mobility Bed Mobility ?Overal bed mobility: Needs Assistance ?Bed Mobility: Sit to Sidelying, Rolling, Sidelying to  Sit ?Rolling: Supervision ?Sidelying to sit: Mod assist ?  ?  ?Sit to sidelying: Mod assist ?General bed mobility comments: mod assist for sit to sidelying due to fatigue ?  ? ?Transfers ?Overall transfer level: Needs assistance ?Equipment used: Rolling walker (2 wheels) ?Transfers: Sit to/from Stand, Bed to chair/wheelchair/BSC ?Sit to Stand: Mod assist, From elevated surface ?  ?  ?Step pivot transfers: Mod assist ?  ?  ?General transfer comment: mod assist to stand from EOB and for safety with walker with transfers.  Mod assist +2 to stand from recliner ?  ?  ?Balance Overall balance assessment: Needs assistance ?Sitting-balance support: Feet supported, Bilateral upper extremity supported ?Sitting balance-Leahy Scale: Poor ?Sitting balance - Comments: UE support ?Postural control: Posterior lean, Right lateral lean ?Standing balance support: Bilateral upper extremity supported, Reliant on assistive device for balance ?Standing balance-Leahy Scale: Poor ?Standing balance comment: min assist for static balance with patient reliant on RW ?  ?  ?  ?  ?  ?  ?  ?  ?  ?  ?  ?   ? ?ADL either performed or assessed with clinical judgement  ? ?ADL Overall ADL's : Needs assistance/impaired ?  ?  ?  ?  ?  ?  ?  ?  ?  ?  ?Lower Body Dressing: Moderate assistance;Sitting/lateral leans;With adaptive equipment ?Lower Body Dressing Details (indicate cue type and reason): AE training with reacher and sock aid for doffing and donning socks ?Toilet Transfer: Moderate assistance;Rolling walker (2 wheels);Stand-pivot ?Toilet Transfer Details (indicate cue type and reason): simulated to recliner with mod assist to stand and with pivoting with walker for safety.  Mod assist +2 to stand from recliner ?  ?  ?  ?  ?  ?  General ADL Comments: performed AE training for LB dressing and simulated toilet transfes ?  ? ?Extremity/Trunk Assessment   ?  ?  ?  ?  ?  ? ?Vision   ?  ?  ?Perception   ?  ?Praxis   ?  ? ?Cognition Arousal/Alertness:  Awake/alert ?Behavior During Therapy: [facility] for tasks assessed/performed ?Overall Cognitive Status: No family/caregiver present to determine baseline cognitive functioning ?  ?  ?  ?  ?  ?  ?  ?  ?  ?  ?  ?  ?  ?  ?  ?  ?General Comments: unable to recall back precautions ?  ?  ?   ?Exercises   ? ?  ?Shoulder Instructions   ? ? ?  ?General Comments    ? ? ?Pertinent Vitals/ Pain       Pain Assessment ?Pain Assessment: No/denies pain ? ?Home Living   ?  ?  ?  ?  ?  ?  ?  ?  ?  ?  ?  ?  ?  ?  ?  ?  ?  ?  ? ?  ?Prior Functioning/Environment    ?  ?  ?  ?   ? ?Frequency ? Min 2X/week  ? ? ? ? ?  ?Progress Toward Goals ? ?OT Goals(current goals can now be found in the care plan section) ? Progress towards OT goals: Progressing toward goals ? ?Acute Rehab OT Goals ?Patient Stated Goal: get better ?OT Goal Formulation: With patient ?Time For Goal Achievement: 07/13/21 ?Potential to Achieve Goals: Good ?ADL Goals ?Pt Will Perform Lower Body Dressing: with min assist;sitting/lateral leans;with adaptive equipment ?Pt Will Transfer to Toilet: with min assist;stand pivot transfer;bedside commode  ?Plan Discharge plan remains appropriate   ? ?Co-evaluation ? ? ?   ?  ?  ?  ?  ? ?  ?AM-PAC OT "6 Clicks" Daily Activity     ?Outcome Measure ? ? Help from another person eating meals?: None ?Help from another person taking care of personal grooming?: None ?Help from another person toileting, which includes using toliet, bedpan, or urinal?: A Lot ?Help from another person bathing (including washing, rinsing, drying)?: A Lot ?Help from another person to put on and taking off regular upper body clothing?: A Little ?Help from another person to put on and taking off regular lower body clothing?: A Lot ?6 Click Score: 17 ? ?  ?End of Session Equipment Utilized During Treatment: Gait belt;Rolling walker (2 wheels) ? ?OT Visit Diagnosis: Unsteadiness on feet (R26.81);Muscle weakness (generalized) (M62.81);History of falling (Z91.81) ?   ?Activity Tolerance Patient tolerated treatment well ?  ?Patient Left in bed;with call bell/phone within reach;with bed alarm set ?  ?Nurse Communication Mobility status ?  ? ?   ? ?Time: [phone] ?OT Time Calculation (min): 33 min ? ?Charges: OT General Charges ?$OT Visit: 1 Visit ?OT Treatments ?$Self Care/Home Management : 23-37 mins ? ?Alfonse Flavors, OTA ?Acute Rehabilitation Services  ?Pager [phone] ?Office [phone] ? ? ?Dewain Penning ?07/02/2021, 9:43 AM ?

## 2021-07-02 NOTE — Discharge Instructions (Signed)
Wound Care Keep incision covered and dry for two days.    Do not put any creams, lotions, or ointments on incision. Leave steri-strips on back.  They will fall off by themselves. You are fine to shower. Let water run over incision and pat dry.  Activity Walk each and every day, increasing distance each day. No lifting greater than 5 lbs.  Avoid excessive back motion. No driving for 2 weeks; may ride as a passenger locally.  Diet Resume your normal diet.   Return to Work Will be discussed at your follow up appointment.  Call Your Doctor If Any of These Occur Redness, drainage, or swelling at the wound.  Temperature greater than 101 degrees. Severe pain not relieved by pain medication. Incision starts to come apart.  Follow Up Appt Call [phone] today for appointment in 2-3 weeks if you don't already have one or for any problems. 

## 2021-07-02 NOTE — Progress Notes (Signed)
? ?  Providing Compassionate, Quality Care - Together ? ? ?Subjective: ?Patient reports he's ready to discharge. ? ?Objective: ?Vital signs in last 24 hours: ?Temp:  [97.6 ?F (36.4 ?C)-98.2 ?F (36.8 ?C)] 97.8 ?F (36.6 ?C) (03/20 1144) ?Pulse Rate:  [79-85] 79 (03/20 1144) ?Resp:  [20] 20 (03/19 2346) ?BP: (90-127)/(58-70) 120/70 (03/20 1144) ?SpO2:  [97 %-98 %] 98 % (03/20 1144) ? ?Intake/Output from previous day: ?03/19 0701 - 03/20 0700 ?In: 3 [I.V.:3] ?Out: 650 [Urine:650] ?Intake/Output this shift: ?No intake/output data recorded. ? ?Alert and oriented x 4 ?PERRLA ?CN II-XII grossly intact ?MAE, Strength and sensation intact ?Incision is covered with Honeycomb dressing and Steri Strips; Dressing is clean, dry, and intact ? ? ?Lab Results: ?Recent Labs  ?  06/29/21 ?1440 06/30/21 ?0436  ?WBC  --  14.9*  ?HGB 10.5* 10.5*  ?HCT 31.0* 31.3*  ?PLT  --  140*  ? ?BMET ?Recent Labs  ?  06/30/21 ?0436 07/01/21 ?0422  ?NA 135 135  ?K 5.8* 4.5  ?CL 104 104  ?CO2 25 26  ?GLUCOSE 134* 95  ?BUN 26* 25*  ?CREATININE 1.23 1.13  ?CALCIUM 8.8* 8.5*  ? ? ?Studies/Results: ?No results found. ? ?Assessment/Plan: ?Patient underwent decompression at L1-2 and L2-3 with extension of his posterior fusion to these levels. He is doing very well post operatively and is ready for discharge back to ALF. ? ? LOS: 3 days  ? ?-Awaiting rep from ALF to review for admission back to the facility. ?-Continue to mobilize. ? ? ?Val Eagle, DNP, AGNP-C ?Nurse Practitioner ? ?[facility] ?[address], [place], [place] [address] ?P: [phone]    F: [phone] ? ?07/02/2021, 11:58 AM ? ? ? ? ?

## 2021-07-02 NOTE — Care Management Important Message (Signed)
Important Message ? ?Patient Details  ?Name: [name] ?MRN: [id number] ?Date of Birth: 1945/09/04 ? ? ?Medicare Important Message Given:  Yes ? ? ? ? ?Levada Dy  Pauline Trainer-Martin ?07/02/2021, 1:56 PM ?

## 2021-07-03 ENCOUNTER — Encounter (HOSPITAL_COMMUNITY): Payer: Self-pay | Admitting: Neurosurgery

## 2021-07-03 LAB — BPAM RBC
Blood Product Expiration Date: 202304092359
Blood Product Expiration Date: 202304092359
Blood Product Expiration Date: 202304092359
ISSUE DATE / TIME: 202303171239
ISSUE DATE / TIME: 202303171239
Unit Type and Rh: 5100
Unit Type and Rh: 5100
Unit Type and Rh: 5100

## 2021-07-03 LAB — TYPE AND SCREEN
ABO/RH(D): O POS
Antibody Screen: NEGATIVE
Unit division: 0
Unit division: 0
Unit division: 0

## 2021-07-04 NOTE — Progress Notes (Signed)
Patient's transfusion required for acute blood loss anemia during surgery. ?

## 2021-07-06 MED FILL — Sodium Chloride IV Soln 0.9%: INTRAVENOUS | Qty: 1000 | Status: AC

## 2021-07-06 MED FILL — Heparin Sodium (Porcine) Inj 1000 Unit/ML: INTRAMUSCULAR | Qty: 30 | Status: AC

## 2021-07-10 ENCOUNTER — Telehealth: Payer: Self-pay

## 2021-07-13 ENCOUNTER — Ambulatory Visit (INDEPENDENT_AMBULATORY_CARE_PROVIDER_SITE_OTHER): Payer: 59 | Admitting: Urology

## 2021-07-13 VITALS — BP 92/57 | HR 69

## 2021-07-13 DIAGNOSIS — R339 Retention of urine, unspecified: Secondary | ICD-10-CM | POA: Diagnosis not present

## 2021-07-13 DIAGNOSIS — N3000 Acute cystitis without hematuria: Secondary | ICD-10-CM | POA: Diagnosis not present

## 2021-07-13 DIAGNOSIS — N138 Other obstructive and reflux uropathy: Secondary | ICD-10-CM | POA: Diagnosis not present

## 2021-07-13 DIAGNOSIS — N401 Enlarged prostate with lower urinary tract symptoms: Secondary | ICD-10-CM | POA: Diagnosis not present

## 2021-07-13 LAB — BLADDER SCAN AMB NON-IMAGING: Scan Result: 345

## 2021-07-13 MED ORDER — TRIMETHOPRIM 100 MG PO TABS: 100.0000 mg | ORAL_TABLET | Freq: Every evening | ORAL | 11 refills | Status: DC

## 2021-07-13 MED ORDER — TAMSULOSIN HCL 0.4 MG PO CAPS: 0.4000 mg | ORAL_CAPSULE | Freq: Two times a day (BID) | ORAL | 11 refills | Status: DC

## 2021-07-13 NOTE — Progress Notes (Signed)
? ?07/13/2021 ?9:22 AM  ? ?[name] ?23-May-1945 ?[id number] ? ?Referring provider: Galvin Proffer, MD ?[address] ?[place],  [place] [address] ? ?Followup BPH and recurrent UTI ? ? ?HPI: ?[name] is a 76yo here for followup for BPh and recurrent UTI. IPSS 4 QOl 1 on flomax BID. Urine stream strong. Nocturia 1x. No urinary hesitancy. No UTIs since last visit while on trimethoprim.  ? ? ? ?PMH: ?Past Medical History:  ?Diagnosis Date  ? Arthritis   ? Bronchitis   ? hx of  ? COPD (chronic obstructive pulmonary disease) (HCC)   ? GERD (gastroesophageal reflux disease)   ? History of kidney stones   ? Hypertension   ? Hypothyroidism   ? 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 Restless leg syndrome   ? ? ?Surgical History: ?Past Surgical History:  ?Procedure Laterality Date  ? BACK SURGERY    ? Fusion 2005  ? Graves Disease    ? JOINT REPLACEMENT  1970  ? Right knee   ? LAMINECTOMY WITH POSTERIOR LATERAL ARTHRODESIS LEVEL 2 N/A 06/29/2021  ? Procedure: Laminectomy - Lumbar one-two, Lumbar two-three - redo with posterior lateral fusion with pedicle screws;  Surgeon: Julio Sicks, MD;  Location: [facility] OR;  Service: Neurosurgery;  Laterality: N/A;  ? NECK SURGERY    ? POSTERIOR CERVICAL FUSION/FORAMINOTOMY  12/05/2011  ? Procedure: POSTERIOR CERVICAL FUSION/FORAMINOTOMY LEVEL 4;  Surgeon: Reinaldo Meeker, MD;  Location: [facility];  Service: Neurosurgery;  Laterality: N/A;  Posterior Cervical fixation Cervical four-Seven,Cervical Decompresson Cervical three  ? THORACIC DISCECTOMY N/A 01/08/2021  ? Procedure: Thoracic One-Two, Thoracic Two-Three, Thoracic Eleven-Twelve, Thoracic Twelve-Lumbar One Laminectomy;  Surgeon: Julio Sicks, MD;  Location: [facility] OR;  Service: Neurosurgery;  Laterality: N/A;  ? ? ?Home Medications:  ?Allergies as of 07/13/2021   ? ?   Reactions  ? Sulfa Antibiotics Other (See Comments)  ? Causes blisters and skin to peel from inside mouth  ? ?  ? ?  ?Medication List  ?  ? ?  ? Accurate as of  July 13, 2021  9:22 AM. If you have any questions, ask your nurse or doctor.  ?  ?  ? ?  ? ?aspirin 81 MG chewable tablet ?Chew 81 mg by mouth daily. ?  ?benazepril 20 MG tablet ?Commonly known as: LOTENSIN ?Take 20 mg by mouth daily. ?  ?diazepam 5 MG tablet ?Commonly known as: VALIUM ?Take 1-2 tablets (5-10 mg total) by mouth every 6 (six) hours as needed for muscle spasms. ?  ?doxycycline 100 MG capsule ?Commonly known as: VIBRAMYCIN ?Take 1 capsule (100 mg total) by mouth 2 (two) times daily. ?  ?DULoxetine 60 MG capsule ?Commonly known as: CYMBALTA ?Take 60 mg by mouth daily. ?  ?HYDROcodone-acetaminophen 10-325 MG tablet ?Commonly known as: NORCO ?Take 1 tablet by mouth every 4 (four) hours as needed for moderate pain ((score 4 to 6)). ?  ?levothyroxine 150 MCG tablet ?Commonly known as: SYNTHROID ?Take 150 mcg by mouth daily before breakfast. ?  ?loperamide 2 MG capsule ?Commonly known as: IMODIUM ?Take 2 mg by mouth as needed for diarrhea or loose stools. One capsule with each loose stool - max of 5 doses in 24 hours. ?  ?neomycin-bacitracin-polymyxin ointment ?Commonly known as: NEOSPORIN ?Apply 1 application. topically 2 (two) times daily. Apply to left foot toes until healed ?  ?simvastatin 20 MG tablet ?Commonly known as: ZOCOR ?Take 20 mg by mouth at bedtime. ?  ?  tamsulosin 0.4 MG Caps capsule ?Commonly known as: FLOMAX ?Take 1 capsule (0.4 mg total) by mouth in the morning and at bedtime. ?  ?trimethoprim 100 MG tablet ?Commonly known as: TRIMPEX ?Take 1 tablet (100 mg total) by mouth at bedtime. ?  ?vitamin B-12 1000 MCG tablet ?Commonly known as: CYANOCOBALAMIN ?Take 1,000 mcg by mouth daily. ?  ?Vitamin D3 1.25 MG (50000 UT) Tabs ?Take 50,000 Units by mouth every Wednesday. ?  ?cholecalciferol 25 MCG (1000 UNIT) tablet ?Commonly known as: VITAMIN D3 ?Take 1,000 Units by mouth daily. ?  ? ?  ? ? ?Allergies:  ?Allergies  ?Allergen Reactions  ? Sulfa Antibiotics Other (See Comments)  ?  Causes  blisters and skin to peel from inside mouth  ? ? ?Family History: ?Family History  ?Problem Relation Age of Onset  ? Heart attack Mother [id number]  ? Heart disease Father   ? ? ?Social History:  reports that he has been smoking cigarettes. He has a 12.50 pack-year smoking history. He has never used smokeless tobacco. He reports that he does not drink alcohol and does not use drugs. ? ?ROS: ?All other review of systems were reviewed and are negative except what is noted above in HPI ? ?Physical Exam: ?BP (!) 92/57   Pulse 69   ?Constitutional:  Alert and oriented, No acute distress. ?HEENT: [place] AT, moist mucus membranes.  Trachea midline, no masses. ?Cardiovascular: No clubbing, cyanosis, or edema. ?Respiratory: Normal respiratory effort, no increased work of breathing. ?GI: Abdomen is soft, nontender, nondistended, no abdominal masses ?GU: No CVA tenderness.  ?Lymph: No cervical or inguinal lymphadenopathy. ?Skin: No rashes, bruises or suspicious lesions. ?Neurologic: Grossly intact, no focal deficits, moving all 4 extremities. ?Psychiatric: Normal mood and affect. ? ?Laboratory Data: ?Lab Results  ?Component Value Date  ? WBC 14.9 (H) 06/30/2021  ? HGB 10.5 (L) 06/30/2021  ? HCT 31.3 (L) 06/30/2021  ? MCV 92.6 06/30/2021  ? PLT 140 (L) 06/30/2021  ? ? ?Lab Results  ?Component Value Date  ? CREATININE 1.13 07/01/2021  ? ? ?No results found for: PSA ? ?No results found for: TESTOSTERONE ? ?No results found for: HGBA1C ? ?Urinalysis ?   ?Component Value Date/Time  ? APPEARANCEUR Hazy (A) 06/11/2021 1122  ? GLUCOSEU Negative 06/11/2021 1122  ? BILIRUBINUR Negative 06/11/2021 1122  ? PROTEINUR Trace (A) 06/11/2021 1122  ? NITRITE Negative 06/11/2021 1122  ? LEUKOCYTESUR 3+ (A) 06/11/2021 1122  ? ? ?Lab Results  ?Component Value Date  ? LABMICR See below: 06/11/2021  ? WBCUA >30 (A) 06/11/2021  ? LABEPIT 0-10 06/11/2021  ? MUCUS Present 05/09/2021  ? BACTERIA Many (A) 06/11/2021  ? ? ?Pertinent Imaging: ? ?No results found for  this or any previous visit. ? ?No results found for this or any previous visit. ? ?No results found for this or any previous visit. ? ?No results found for this or any previous visit. ? ?No results found for this or any previous visit. ? ?No results found for this or any previous visit. ? ?No results found for this or any previous visit. ? ?Results for orders placed during the hospital encounter of 08/10/20 ? ?CT RENAL STONE STUDY ? ?Narrative ?CLINICAL DATA:  Urinary tract calculus, follow-up nephrolithiasis in ?a patient with urinary frequency. ? ?EXAM: ?CT ABDOMEN AND PELVIS WITHOUT CONTRAST ? ?TECHNIQUE: ?Multidetector CT imaging of the abdomen and pelvis was performed ?following the standard protocol without IV contrast. ? ?COMPARISON:  June 28, 2004. ? ?FINDINGS: ?Lower chest:  Lung bases are clear. No effusion. No consolidation. ?Three-vessel coronary artery disease. Heart is incompletely imaged ?without pericardial effusion. Normal heart size. ? ?Hepatobiliary: Cholelithiasis. No gross biliary duct distension. The ?liver unremarkable on noncontrast imaging. ? ?Pancreas: Normal, without mass, inflammation or ductal dilatation. ? ?Spleen: Subtle density focus in the spleen likely benign though too ?small for definitive characterization and 9 mm. ? ?Adrenals/Urinary Tract: Adrenal glands are normal. ? ?Cystic renal lesions bilaterally. Cysts arise from the bilateral ?kidneys. Largest arises from the lower pole of the RIGHT kidney ?measuring 4.6 x 3.2 cm. Density measurements for renal lesions less ?than 20 Hounsfield units. Some of the measurements obtained in the ?lower pole may be compromised by streak artifact from adjacent ?spinal hardware. ? 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 No ?hydronephrosis. No ureteral calculi. ? ?Punctate LEFT intrarenal calculus measures approximately 2 mm in the ?upper pole. No perinephric stranding. Urinary bladder with  smooth ?contours. ? ?Stomach/Bowel: Scattered fluid-filled loops of small bowel. No signs ?of bowel obstruction or adjacent stranding. Stomach without dilation ?or adjacent stranding. ? ?Appendix is normal. ? ?Vascular/Lymphatic: Calcified at

## 2021-07-13 NOTE — Progress Notes (Signed)
post void residual= 345 ?

## 2021-07-14 ENCOUNTER — Other Ambulatory Visit: Payer: Self-pay

## 2021-07-14 ENCOUNTER — Emergency Department (HOSPITAL_COMMUNITY)
Admission: EM | Admit: 2021-07-14 | Discharge: 2021-07-15 | Disposition: A | Discharge status: Skilled Nursing Facility | Payer: 59 | Source: Home / Self Care | Attending: Emergency Medicine | Admitting: Emergency Medicine

## 2021-07-14 ENCOUNTER — Emergency Department (HOSPITAL_COMMUNITY): Payer: 59

## 2021-07-14 ENCOUNTER — Encounter (HOSPITAL_COMMUNITY): Payer: Self-pay

## 2021-07-14 DIAGNOSIS — E860 Dehydration: Secondary | ICD-10-CM | POA: Insufficient documentation

## 2021-07-14 DIAGNOSIS — I959 Hypotension, unspecified: Secondary | ICD-10-CM | POA: Diagnosis not present

## 2021-07-14 DIAGNOSIS — Z7982 Long term (current) use of aspirin: Secondary | ICD-10-CM | POA: Insufficient documentation

## 2021-07-14 DIAGNOSIS — D649 Anemia, unspecified: Secondary | ICD-10-CM | POA: Insufficient documentation

## 2021-07-14 DIAGNOSIS — R8289 Other abnormal findings on cytological and histological examination of urine: Secondary | ICD-10-CM | POA: Insufficient documentation

## 2021-07-14 DIAGNOSIS — N390 Urinary tract infection, site not specified: Secondary | ICD-10-CM | POA: Diagnosis not present

## 2021-07-14 LAB — CBC WITH DIFFERENTIAL/PLATELET
Abs Immature Granulocytes: 0.03 10*3/uL (ref 0.00–0.07)
Basophils Absolute: 0 10*3/uL (ref 0.0–0.1)
Basophils Relative: 1 %
Eosinophils Absolute: 0.1 10*3/uL (ref 0.0–0.5)
Eosinophils Relative: 1 %
HCT: 29.4 % — ABNORMAL LOW (ref 39.0–52.0)
Hemoglobin: 9.3 g/dL — ABNORMAL LOW (ref 13.0–17.0)
Immature Granulocytes: 0 %
Lymphocytes Relative: 15 %
Lymphs Abs: 1.2 10*3/uL (ref 0.7–4.0)
MCH: 30.5 pg (ref 26.0–34.0)
MCHC: 31.6 g/dL (ref 30.0–36.0)
MCV: 96.4 fL (ref 80.0–100.0)
Monocytes Absolute: 0.3 10*3/uL (ref 0.1–1.0)
Monocytes Relative: 4 %
Neutro Abs: 6.2 10*3/uL (ref 1.7–7.7)
Neutrophils Relative %: 79 %
Platelets: 239 10*3/uL (ref 150–400)
RBC: 3.05 MIL/uL — ABNORMAL LOW (ref 4.22–5.81)
RDW: 13.7 % (ref 11.5–15.5)
WBC: 7.9 10*3/uL (ref 4.0–10.5)
nRBC: 0 % (ref 0.0–0.2)

## 2021-07-14 LAB — COMPREHENSIVE METABOLIC PANEL
ALT: 9 U/L (ref 0–44)
AST: 12 U/L — ABNORMAL LOW (ref 15–41)
Albumin: 3.6 g/dL (ref 3.5–5.0)
Alkaline Phosphatase: 137 U/L — ABNORMAL HIGH (ref 38–126)
Anion gap: 8 (ref 5–15)
BUN: 28 mg/dL — ABNORMAL HIGH (ref 8–23)
CO2: 26 mmol/L (ref 22–32)
Calcium: 9 mg/dL (ref 8.9–10.3)
Chloride: 106 mmol/L (ref 98–111)
Creatinine, Ser: 1.44 mg/dL — ABNORMAL HIGH (ref 0.61–1.24)
GFR, Estimated: 51 mL/min — ABNORMAL LOW (ref >60)
Glucose, Bld: 129 mg/dL — ABNORMAL HIGH (ref 70–99)
Potassium: 4.7 mmol/L (ref 3.5–5.1)
Sodium: 140 mmol/L (ref 135–145)
Total Bilirubin: 0.4 mg/dL (ref 0.3–1.2)
Total Protein: 6.8 g/dL (ref 6.5–8.1)

## 2021-07-14 LAB — LACTIC ACID, PLASMA: Lactic Acid, Venous: 1.5 mmol/L (ref 0.5–1.9)

## 2021-07-14 LAB — URINALYSIS, ROUTINE W REFLEX MICROSCOPIC
Bacteria, UA: NONE SEEN
Bilirubin Urine: NEGATIVE
Glucose, UA: NEGATIVE mg/dL
Ketones, ur: NEGATIVE mg/dL
Nitrite: POSITIVE — AB
Protein, ur: NEGATIVE mg/dL
Specific Gravity, Urine: 1.015 (ref 1.005–1.030)
pH: 7 (ref 5.0–8.0)

## 2021-07-14 MED ORDER — VITAMIN D 25 MCG (1000 UNIT) PO TABS: 1000.0000 [IU] | ORAL_TABLET | Freq: Every day | ORAL | Status: DC

## 2021-07-14 MED ORDER — TAMSULOSIN HCL 0.4 MG PO CAPS: 0.4000 mg | ORAL_CAPSULE | Freq: Every day | ORAL | Status: DC

## 2021-07-14 MED ORDER — TAMSULOSIN HCL 0.4 MG PO CAPS
0.4000 mg | ORAL_CAPSULE | Freq: Every day | ORAL | Status: DC
  Administered 2021-07-14: 0.4 mg via ORAL
  Filled 2021-07-14: qty 1

## 2021-07-14 MED ORDER — LACTATED RINGERS IV BOLUS
250.0000 mL | Freq: Once | INTRAVENOUS | Status: AC
Start: 2021-07-14 — End: 2021-07-14
  Administered 2021-07-14: 250 mL via INTRAVENOUS

## 2021-07-14 MED ORDER — LEVOTHYROXINE SODIUM 50 MCG PO TABS: 150.0000 ug | ORAL_TABLET | Freq: Every day | ORAL | Status: DC

## 2021-07-14 MED ORDER — TRIMETHOPRIM 100 MG PO TABS
100.0000 mg | ORAL_TABLET | Freq: Every day | ORAL | Status: DC
  Administered 2021-07-15: 100 mg via ORAL
  Filled 2021-07-14 (×2): qty 1

## 2021-07-14 MED ORDER — SIMVASTATIN 10 MG PO TABS
20.0000 mg | ORAL_TABLET | Freq: Every day | ORAL | Status: DC
  Administered 2021-07-14: 20 mg via ORAL
  Filled 2021-07-14: qty 2

## 2021-07-14 MED ORDER — BENAZEPRIL HCL 10 MG PO TABS: 20.0000 mg | ORAL_TABLET | Freq: Every day | ORAL | Status: DC

## 2021-07-14 MED ORDER — DIAZEPAM 5 MG PO TABS
5.0000 mg | ORAL_TABLET | Freq: Four times a day (QID) | ORAL | Status: DC | PRN
  Filled 2021-07-14: qty 1

## 2021-07-14 MED ORDER — ASPIRIN 81 MG PO CHEW: 81.0000 mg | CHEWABLE_TABLET | Freq: Every day | ORAL | Status: DC

## 2021-07-14 MED ORDER — LEVOTHYROXINE SODIUM 50 MCG PO TABS
150.0000 ug | ORAL_TABLET | Freq: Every day | ORAL | Status: DC
  Administered 2021-07-15: 150 ug via ORAL
  Filled 2021-07-14: qty 3

## 2021-07-14 MED ORDER — VITAMIN B-12 1000 MCG PO TABS: 1000.0000 ug | ORAL_TABLET | Freq: Every day | ORAL | Status: DC

## 2021-07-14 MED ORDER — HYDROCODONE-ACETAMINOPHEN 10-325 MG PO TABS
1.0000 | ORAL_TABLET | ORAL | Status: DC | PRN
  Filled 2021-07-14: qty 1

## 2021-07-14 MED ORDER — LACTATED RINGERS IV BOLUS
500.0000 mL | Freq: Once | INTRAVENOUS | Status: AC
  Administered 2021-07-14: 500 mL via INTRAVENOUS

## 2021-07-14 MED ORDER — TRIMETHOPRIM 100 MG PO TABS
100.0000 mg | ORAL_TABLET | Freq: Every day | ORAL | Status: DC
  Filled 2021-07-14: qty 1

## 2021-07-14 MED ORDER — DULOXETINE HCL 30 MG PO CPEP: 60.0000 mg | ORAL_CAPSULE | Freq: Every day | ORAL | Status: DC

## 2021-07-14 NOTE — ED Notes (Signed)
Lab at bedside for cultures and lactic acid.  ?

## 2021-07-14 NOTE — ED Provider Notes (Signed)
? EMERGENCY DEPARTMENT ?Provider Note ? ? ?CSN: [id number] ?Arrival date & time: 07/14/21  1354 ? ?  ? ?History ? ?Chief Complaint  ?Patient presents with  ? Hypotension  ? ? ?[name] is a 76 y.o. male who presents today from [facility] for evaluation of hypertension. ?Per triage note patient blood pressure was 73/48.  Patient states that he has been doing well since his surgery.  He had lumbar laminectomies on 3/17.  He states that he saw his surgeon yesterday and aside from being mildly hypotensive with pressures in the 90s his evaluation was reassuring.  I am unable to see these notes this information is per patient.  Patient also recently saw his urologist for history of incomplete bladder emptying and chronic UTIs and aside from pressure being 92/57 at the office visit his evaluation was reportedly reassuring.  He reports he is eating and drinking well.  He denies any new pains.  No fevers.  He denies any coughing or shortness of breath.  He states he has never had issues with hypotension before. ? ?He did not get any interventions with EMS ? ?Patient states that he has been feeling well.   ? ?HPI ? ?  ? ?Home Medications ?Prior to Admission medications   ?Medication Sig Start Date End Date Taking? Authorizing Provider  ?aspirin 81 MG chewable tablet Chew 81 mg by mouth daily.   Yes [provider]  ?benazepril (LOTENSIN) 20 MG tablet Take 20 mg by mouth daily.   Yes [provider]  ?Cholecalciferol (VITAMIN D3) 1.25 MG (50000 UT) TABS Take 50,000 Units by mouth every Wednesday.   Yes [provider]  ?cholecalciferol (VITAMIN D3) 25 MCG (1000 UNIT) tablet Take 1,000 Units by mouth daily.   Yes [provider]  ?diazepam (VALIUM) 5 MG tablet Take 1-2 tablets (5-10 mg total) by mouth every 6 (six) hours as needed for muscle spasms. ?Patient taking differently: Take 5 mg by mouth every 6 (six) hours as needed for muscle spasms. 07/01/21  Yes  Barnett AbuElsner, Henry, MD  ?DULoxetine (CYMBALTA) 60 MG capsule Take 60 mg by mouth daily. 12/28/18  Yes [provider]  ?HYDROcodone-acetaminophen (NORCO) 10-325 MG tablet Take 1 tablet by mouth every 4 (four) hours as needed for moderate pain ((score 4 to 6)). 07/01/21  Yes Barnett AbuElsner, Henry, MD  ?levothyroxine (SYNTHROID) 150 MCG tablet Take 150 mcg by mouth daily before breakfast. 05/24/21  Yes [provider]  ?loperamide (IMODIUM) 2 MG capsule Take 2 mg by mouth as needed for diarrhea or loose stools. One capsule with each loose stool - max of 5 doses in 24 hours.   Yes [provider]  ?simvastatin (ZOCOR) 20 MG tablet Take 20 mg by mouth at bedtime.   Yes [provider]  ?tamsulosin (FLOMAX) 0.4 MG CAPS capsule Take 1 capsule (0.4 mg total) by mouth in the morning and at bedtime. 07/13/21  Yes [name], Mardene CelestePatrick L, MD  ?trimethoprim (TRIMPEX) 100 MG tablet Take 1 tablet (100 mg total) by mouth at bedtime. 07/13/21  Yes [name], Mardene CelestePatrick L, MD  ?vitamin B-12 (CYANOCOBALAMIN) 1000 MCG tablet Take 1,000 mcg by mouth daily.   Yes [provider]  ?   ? ?Allergies    ?Sulfa antibiotics   ? ?Review of Systems   ?Review of Systems ? ?Physical Exam ?Updated Vital Signs ?BP 101/71 (BP Location: Right Arm)   Pulse 76   Temp 98.2 ?F (36.8 ?C) (Oral)   Resp  16   Ht 6\' 7"  (2.007 m)   Wt 108 kg   SpO2 99%   BMI 26.82 kg/m?  ?Physical Exam ?Vitals and nursing note reviewed.  ?Constitutional:   ?   General: He is not in acute distress. ?   Appearance: He is not ill-appearing or diaphoretic.  ?HENT:  ?   Head: Normocephalic and atraumatic.  ?Eyes:  ?   General: No scleral icterus.    ?   Right eye: No discharge.     ?   Left eye: No discharge.  ?   Conjunctiva/sclera: Conjunctivae normal.  ?Cardiovascular:  ?   Rate and Rhythm: Normal rate and regular rhythm.  ?   Pulses: Normal pulses.  ?   Heart sounds: Normal heart sounds.  ?Pulmonary:  ?   Effort: Pulmonary effort is normal. No  respiratory distress.  ?   Breath sounds: Normal breath sounds. No stridor.  ?Abdominal:  ?   General: There is no distension.  ?   Tenderness: There is no abdominal tenderness. There is no guarding.  ?Musculoskeletal:     ?   General: No deformity.  ?   Cervical back: Normal range of motion and neck supple.  ?   Right lower leg: No edema.  ?   Left lower leg: No edema.  ?Skin: ?   General: Skin is warm and dry.  ?Neurological:  ?   Mental Status: He is alert. Mental status is at baseline.  ?   Sensory: No sensory deficit (Sensation intact to light touch to bilateral lower extremities.).  ?   Motor: No weakness (5/5 strength dorsiflexion and plantarflexion bilaterally.) or abnormal muscle tone.  ?Psychiatric:     ?   Mood and Affect: Mood normal.     ?   Behavior: Behavior normal.  ? ? ?ED Results / Procedures / Treatments   ?Labs ?(all labs ordered are listed, but only abnormal results are displayed) ?Labs Reviewed  ?COMPREHENSIVE METABOLIC PANEL - Abnormal; Notable for the following components:  ?    Result Value  ? Glucose, Bld 129 (*)   ? BUN 28 (*)   ? Creatinine, Ser 1.44 (*)   ? AST 12 (*)   ? Alkaline Phosphatase 137 (*)   ? GFR, Estimated 51 (*)   ? All other components within normal limits  ?CBC WITH DIFFERENTIAL/PLATELET - Abnormal; Notable for the following components:  ? RBC 3.05 (*)   ? Hemoglobin 9.3 (*)   ? HCT 29.4 (*)   ? All other components within normal limits  ?URINALYSIS, ROUTINE W REFLEX MICROSCOPIC - Abnormal; Notable for the following components:  ? Hgb urine dipstick SMALL (*)   ? Nitrite POSITIVE (*)   ? Leukocytes,Ua MODERATE (*)   ? All other components within normal limits  ?CULTURE, BLOOD (ROUTINE X 2)  ?CULTURE, BLOOD (ROUTINE X 2)  ?URINE CULTURE  ?LACTIC ACID, PLASMA  ? ? ?EKG ?EKG Interpretation ? ?Date/Time:  Saturday July 14 2021 14:12:07 EDT ?Ventricular Rate:  92 ?PR Interval:  200 ?QRS Duration: 100 ?QT Interval:  346 ?QTC Calculation: 428 ?R Axis:   88 ?Text  Interpretation: Sinus rhythm Borderline right axis deviation Low voltage, precordial leads similar to prior tracing baseline? minimal elevation to inferior leads unchanged no stemi Confirmed by [id number]) on 07/14/2021 3:34:19 PM ? ?Radiology ?DG Chest 2 View ? ?Result Date: 07/14/2021 ?CLINICAL DATA:  Hypotension.  Recent back surgery. EXAM: CHEST - 2 VIEW COMPARISON:  01/31/2019 chest radiograph  FINDINGS: The cardiomediastinal silhouette is unremarkable. There is no evidence of focal airspace disease, pulmonary edema, suspicious pulmonary nodule/mass, pleural effusion, or pneumothorax. No acute bony abnormalities are identified. Remote bilateral rib fractures again noted. Surgical hardware within the lumbar spine visualized. IMPRESSION: No active cardiopulmonary disease. Electronically Signed   By: Harmon Pier M.D.   On: 07/14/2021 14:53   ? ?Procedures ?Procedures  ? ? ?Medications Ordered in ED ?Medications  ?aspirin chewable tablet 81 mg (has no administration in time range)  ?benazepril (LOTENSIN) tablet 20 mg (has no administration in time range)  ?cholecalciferol (VITAMIN D3) tablet 1,000 Units (has no administration in time range)  ?diazepam (VALIUM) tablet 5 mg (has no administration in time range)  ?DULoxetine (CYMBALTA) DR capsule 60 mg (has no administration in time range)  ?HYDROcodone-acetaminophen (NORCO) 10-325 MG per tablet 1 tablet (has no administration in time range)  ?simvastatin (ZOCOR) tablet 20 mg (20 mg Oral Given 07/14/21 2253)  ?vitamin B-12 (CYANOCOBALAMIN) tablet 1,000 mcg (has no administration in time range)  ?levothyroxine (SYNTHROID) tablet 150 mcg (150 mcg Oral Given 07/15/21 0637)  ?tamsulosin (FLOMAX) capsule 0.4 mg (0.4 mg Oral Given 07/14/21 2327)  ?trimethoprim (TRIMPEX) tablet 100 mg (100 mg Oral Given 07/15/21 0007)  ?lactated ringers bolus 250 mL (0 mLs Intravenous Stopped 07/14/21 1529)  ?lactated ringers bolus 500 mL (0 mLs Intravenous Stopped 07/14/21 1815)  ? ? ?ED Course/  Medical Decision Making/ A&P ?Clinical Course as of 07/15/21 1317  ?Sat Jul 14, 2021  ?1449 Creatinine(!): 1.44 ?13 days ago was 1.13, is not significantly changed. [EH]  ?1449 Hemoglobin(!): 9.3 ?Not changed from previous [EH]  ?1450 WBC:

## 2021-07-14 NOTE — ED Notes (Signed)
Bladder scan 218 ml.

## 2021-07-14 NOTE — ED Triage Notes (Signed)
Patient arrived via EMS from [facility]. Per nursing home staff patients BP 73/48. Patient had back surgery 1 week ago at [facility].  ?

## 2021-07-14 NOTE — ED Notes (Signed)
This nurse called [facility] several times with no answer- straight to voicemail of manager-message left saying nurse attempting to give report of pt being discharged back to this facility and requested call back. Currently a/w EMS transport back to facility ?

## 2021-07-14 NOTE — ED Notes (Signed)
Attempted to obtain urine sample from patient. Patient states "sometimes I can be bashfull I was at the urologist on Thursday and wasn't able to give them a sample either." Bladder scan 268. PA made aware.  ?

## 2021-07-14 NOTE — ED Notes (Signed)
Pt given gingerale per request- pt given urinal-  ?

## 2021-07-14 NOTE — Discharge Instructions (Signed)
While in the emergency room your blood pressure was in the [id number] systolic.  With a little bit of IV fluid this did improve to 125/65. ?I would recommend that you increase how much fluid you are drinking to ensure you are staying well-hydrated. ?You have both blood and urine cultures pending.  If these are positive and you need antibiotics you should receive a phone call. ?If you develop fevers, any pain or new or concerning symptoms please seek additional medical care and evaluation. ?

## 2021-07-14 NOTE — ED Notes (Signed)
Pt a//w EMS transport 

## 2021-07-14 NOTE — ED Notes (Signed)
Pt refused valium and norco-will return to pyxis ?

## 2021-07-15 NOTE — ED Notes (Signed)
Pt transferred to hospital bed for comfort as pt has recently had back surgery ?

## 2021-07-16 LAB — URINE CULTURE: Culture: >=100000 — AB

## 2021-07-17 ENCOUNTER — Encounter (HOSPITAL_COMMUNITY): Payer: Self-pay

## 2021-07-17 ENCOUNTER — Encounter: Payer: Self-pay | Admitting: Urology

## 2021-07-17 ENCOUNTER — Ambulatory Visit: Payer: 59 | Admitting: Urology

## 2021-07-17 ENCOUNTER — Inpatient Hospital Stay (HOSPITAL_COMMUNITY)
Admission: EM | Admit: 2021-07-17 | Discharge: 2021-07-21 | DRG: 690 | Disposition: A | Discharge status: Skilled Nursing Facility | Payer: 59 | Source: Skilled Nursing Facility | Attending: Internal Medicine | Admitting: Internal Medicine

## 2021-07-17 ENCOUNTER — Other Ambulatory Visit: Payer: Self-pay

## 2021-07-17 DIAGNOSIS — Z79890 Hormone replacement therapy (postmenopausal): Secondary | ICD-10-CM

## 2021-07-17 DIAGNOSIS — Z8249 Family history of ischemic heart disease and other diseases of the circulatory system: Secondary | ICD-10-CM | POA: Diagnosis not present

## 2021-07-17 DIAGNOSIS — J449 Chronic obstructive pulmonary disease, unspecified: Secondary | ICD-10-CM | POA: Diagnosis present

## 2021-07-17 DIAGNOSIS — A498 Other bacterial infections of unspecified site: Secondary | ICD-10-CM | POA: Diagnosis not present

## 2021-07-17 DIAGNOSIS — Z882 Allergy status to sulfonamides status: Secondary | ICD-10-CM | POA: Diagnosis not present

## 2021-07-17 DIAGNOSIS — Z7982 Long term (current) use of aspirin: Secondary | ICD-10-CM

## 2021-07-17 DIAGNOSIS — K219 Gastro-esophageal reflux disease without esophagitis: Secondary | ICD-10-CM | POA: Diagnosis present

## 2021-07-17 DIAGNOSIS — N400 Enlarged prostate without lower urinary tract symptoms: Secondary | ICD-10-CM | POA: Diagnosis present

## 2021-07-17 DIAGNOSIS — G2581 Restless legs syndrome: Secondary | ICD-10-CM | POA: Diagnosis present

## 2021-07-17 DIAGNOSIS — R339 Retention of urine, unspecified: Secondary | ICD-10-CM | POA: Diagnosis present

## 2021-07-17 DIAGNOSIS — E039 Hypothyroidism, unspecified: Secondary | ICD-10-CM | POA: Diagnosis present

## 2021-07-17 DIAGNOSIS — E860 Dehydration: Secondary | ICD-10-CM | POA: Diagnosis present

## 2021-07-17 DIAGNOSIS — Z96651 Presence of right artificial knee joint: Secondary | ICD-10-CM | POA: Diagnosis present

## 2021-07-17 DIAGNOSIS — B965 Pseudomonas (aeruginosa) (mallei) (pseudomallei) as the cause of diseases classified elsewhere: Secondary | ICD-10-CM | POA: Diagnosis present

## 2021-07-17 DIAGNOSIS — N390 Urinary tract infection, site not specified: Secondary | ICD-10-CM | POA: Diagnosis not present

## 2021-07-17 DIAGNOSIS — Z79899 Other long term (current) drug therapy: Secondary | ICD-10-CM

## 2021-07-17 DIAGNOSIS — Z1624 Resistance to multiple antibiotics: Secondary | ICD-10-CM | POA: Diagnosis present

## 2021-07-17 DIAGNOSIS — Z981 Arthrodesis status: Secondary | ICD-10-CM | POA: Diagnosis not present

## 2021-07-17 DIAGNOSIS — J438 Other emphysema: Secondary | ICD-10-CM | POA: Diagnosis not present

## 2021-07-17 DIAGNOSIS — M1990 Unspecified osteoarthritis, unspecified site: Secondary | ICD-10-CM | POA: Diagnosis present

## 2021-07-17 DIAGNOSIS — M48062 Spinal stenosis, lumbar region with neurogenic claudication: Secondary | ICD-10-CM | POA: Diagnosis present

## 2021-07-17 DIAGNOSIS — N3000 Acute cystitis without hematuria: Principal | ICD-10-CM

## 2021-07-17 DIAGNOSIS — I10 Essential (primary) hypertension: Secondary | ICD-10-CM | POA: Diagnosis present

## 2021-07-17 DIAGNOSIS — I959 Hypotension, unspecified: Secondary | ICD-10-CM | POA: Diagnosis present

## 2021-07-17 LAB — COMPREHENSIVE METABOLIC PANEL
ALT: 9 U/L (ref 0–44)
AST: 13 U/L — ABNORMAL LOW (ref 15–41)
Albumin: 3.8 g/dL (ref 3.5–5.0)
Alkaline Phosphatase: 154 U/L — ABNORMAL HIGH (ref 38–126)
Anion gap: 9 (ref 5–15)
BUN: 21 mg/dL (ref 8–23)
CO2: 23 mmol/L (ref 22–32)
Calcium: 9 mg/dL (ref 8.9–10.3)
Chloride: 106 mmol/L (ref 98–111)
Creatinine, Ser: 1.18 mg/dL (ref 0.61–1.24)
GFR, Estimated: >60 mL/min (ref >60)
Glucose, Bld: 117 mg/dL — ABNORMAL HIGH (ref 70–99)
Potassium: 4.7 mmol/L (ref 3.5–5.1)
Sodium: 138 mmol/L (ref 135–145)
Total Bilirubin: 0.4 mg/dL (ref 0.3–1.2)
Total Protein: 7 g/dL (ref 6.5–8.1)

## 2021-07-17 LAB — CBC WITH DIFFERENTIAL/PLATELET
Abs Immature Granulocytes: 0.03 10*3/uL (ref 0.00–0.07)
Basophils Absolute: 0.1 10*3/uL (ref 0.0–0.1)
Basophils Relative: 1 %
Eosinophils Absolute: 0.1 10*3/uL (ref 0.0–0.5)
Eosinophils Relative: 1 %
HCT: 30 % — ABNORMAL LOW (ref 39.0–52.0)
Hemoglobin: 9.7 g/dL — ABNORMAL LOW (ref 13.0–17.0)
Immature Granulocytes: 0 %
Lymphocytes Relative: 16 %
Lymphs Abs: 1.5 10*3/uL (ref 0.7–4.0)
MCH: 31.2 pg (ref 26.0–34.0)
MCHC: 32.3 g/dL (ref 30.0–36.0)
MCV: 96.5 fL (ref 80.0–100.0)
Monocytes Absolute: 0.4 10*3/uL (ref 0.1–1.0)
Monocytes Relative: 5 %
Neutro Abs: 6.9 10*3/uL (ref 1.7–7.7)
Neutrophils Relative %: 77 %
Platelets: 233 10*3/uL (ref 150–400)
RBC: 3.11 MIL/uL — ABNORMAL LOW (ref 4.22–5.81)
RDW: 13.8 % (ref 11.5–15.5)
WBC: 9 10*3/uL (ref 4.0–10.5)
nRBC: 0 % (ref 0.0–0.2)

## 2021-07-17 LAB — CBC
HCT: 31 % — ABNORMAL LOW (ref 39.0–52.0)
Hemoglobin: 9.7 g/dL — ABNORMAL LOW (ref 13.0–17.0)
MCH: 31 pg (ref 26.0–34.0)
MCHC: 31.3 g/dL (ref 30.0–36.0)
MCV: 99 fL (ref 80.0–100.0)
Platelets: 246 10*3/uL (ref 150–400)
RBC: 3.13 MIL/uL — ABNORMAL LOW (ref 4.22–5.81)
RDW: 13.9 % (ref 11.5–15.5)
WBC: 9.2 10*3/uL (ref 4.0–10.5)
nRBC: 0 % (ref 0.0–0.2)

## 2021-07-17 LAB — CREATININE, SERUM
Creatinine, Ser: 1.15 mg/dL (ref 0.61–1.24)
GFR, Estimated: >60 mL/min (ref >60)

## 2021-07-17 MED ORDER — ACETAMINOPHEN 325 MG PO TABS: 650.0000 mg | ORAL_TABLET | Freq: Four times a day (QID) | ORAL | Status: DC | PRN

## 2021-07-17 MED ORDER — FENTANYL CITRATE PF 50 MCG/ML IJ SOSY: 12.5000 ug | PREFILLED_SYRINGE | INTRAMUSCULAR | Status: DC | PRN

## 2021-07-17 MED ORDER — VITAMIN B-12 1000 MCG PO TABS
1000.0000 ug | ORAL_TABLET | Freq: Every day | ORAL | Status: DC
  Administered 2021-07-18 – 2021-07-21 (×4): 1000 ug via ORAL
  Filled 2021-07-17 (×4): qty 1

## 2021-07-17 MED ORDER — ONDANSETRON HCL 4 MG PO TABS: 4.0000 mg | ORAL_TABLET | Freq: Four times a day (QID) | ORAL | Status: DC | PRN

## 2021-07-17 MED ORDER — ENSURE ENLIVE PO LIQD
237.0000 mL | Freq: Two times a day (BID) | ORAL | Status: DC
  Administered 2021-07-18 – 2021-07-21 (×7): 237 mL via ORAL

## 2021-07-17 MED ORDER — SODIUM CHLORIDE 0.9 % IV SOLN
1.0000 g | Freq: Three times a day (TID) | INTRAVENOUS | Status: AC
  Administered 2021-07-17 – 2021-07-20 (×9): 1 g via INTRAVENOUS
  Filled 2021-07-17 (×8): qty 10
  Filled 2021-07-17 (×2): qty 1

## 2021-07-17 MED ORDER — ACETAMINOPHEN 650 MG RE SUPP: 650.0000 mg | Freq: Four times a day (QID) | RECTAL | Status: DC | PRN

## 2021-07-17 MED ORDER — HYDROCODONE-ACETAMINOPHEN 10-325 MG PO TABS: 1.0000 | ORAL_TABLET | ORAL | Status: DC | PRN

## 2021-07-17 MED ORDER — TRAZODONE HCL 50 MG PO TABS
25.0000 mg | ORAL_TABLET | Freq: Every evening | ORAL | Status: DC | PRN
Start: 2021-07-17 — End: 2021-07-21

## 2021-07-17 MED ORDER — DIAZEPAM 5 MG PO TABS: 5.0000 mg | ORAL_TABLET | Freq: Four times a day (QID) | ORAL | Status: DC | PRN

## 2021-07-17 MED ORDER — ONDANSETRON HCL 4 MG/2ML IJ SOLN: 4.0000 mg | Freq: Four times a day (QID) | INTRAMUSCULAR | Status: DC | PRN

## 2021-07-17 MED ORDER — LACTATED RINGERS IV SOLN
INTRAVENOUS | Status: AC
Start: 2021-07-17 — End: 2021-07-18

## 2021-07-17 MED ORDER — ASPIRIN 81 MG PO CHEW
81.0000 mg | CHEWABLE_TABLET | Freq: Every day | ORAL | Status: DC
  Administered 2021-07-18 – 2021-07-21 (×4): 81 mg via ORAL
  Filled 2021-07-17 (×4): qty 1

## 2021-07-17 MED ORDER — DULOXETINE HCL 60 MG PO CPEP
60.0000 mg | ORAL_CAPSULE | Freq: Every day | ORAL | Status: DC
  Administered 2021-07-18 – 2021-07-21 (×4): 60 mg via ORAL
  Filled 2021-07-17 (×4): qty 1

## 2021-07-17 MED ORDER — TAMSULOSIN HCL 0.4 MG PO CAPS
0.4000 mg | ORAL_CAPSULE | Freq: Two times a day (BID) | ORAL | Status: DC
  Administered 2021-07-17 – 2021-07-21 (×8): 0.4 mg via ORAL
  Filled 2021-07-17 (×9): qty 1

## 2021-07-17 MED ORDER — SIMVASTATIN 20 MG PO TABS
20.0000 mg | ORAL_TABLET | Freq: Every day | ORAL | Status: DC
  Administered 2021-07-17 – 2021-07-20 (×4): 20 mg via ORAL
  Filled 2021-07-17 (×4): qty 1

## 2021-07-17 MED ORDER — BENAZEPRIL HCL 10 MG PO TABS
20.0000 mg | ORAL_TABLET | Freq: Every day | ORAL | Status: DC
  Filled 2021-07-17: qty 2

## 2021-07-17 MED ORDER — LOPERAMIDE HCL 2 MG PO CAPS
2.0000 mg | ORAL_CAPSULE | ORAL | Status: DC | PRN
  Administered 2021-07-18: 2 mg via ORAL
  Filled 2021-07-17: qty 1

## 2021-07-17 MED ORDER — LEVOTHYROXINE SODIUM 75 MCG PO TABS
150.0000 ug | ORAL_TABLET | Freq: Every day | ORAL | Status: DC
  Administered 2021-07-18 – 2021-07-21 (×4): 150 ug via ORAL
  Filled 2021-07-17 (×4): qty 2

## 2021-07-17 MED ORDER — BISACODYL 5 MG PO TBEC: 5.0000 mg | DELAYED_RELEASE_TABLET | Freq: Every day | ORAL | Status: DC | PRN

## 2021-07-17 MED ORDER — ENOXAPARIN SODIUM 40 MG/0.4ML IJ SOSY
40.0000 mg | PREFILLED_SYRINGE | INTRAMUSCULAR | Status: DC
  Administered 2021-07-17 – 2021-07-20 (×4): 40 mg via SUBCUTANEOUS
  Filled 2021-07-17 (×4): qty 0.4

## 2021-07-17 NOTE — Hospital Course (Addendum)
76 year old gentleman with history of multiple prior spinal surgeries recently status post having a L3-5 decompression and fusion surgery with Dr. Lelon Perla on 06/29/2021.  He did well postoperatively and was discharged on 07/01/2021 to his ALF.  He received home health PT services.  He has a history of recurrent UTIs and BPH and followed by Dr. Ronne Binning in [place].  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  He also has a history of incomplete bladder emptying and it is managed with Flomax twice daily.  He presented to the emergency department on 07/14/2021 from ALF with complaints of low blood pressures.  He was given IV fluids in the emergency department and his hypotension resolved.  His work-up was significant for a urinary tract infection and he was discharged home.  His urine culture came back positive for multidrug-resistant Pseudomonas infection and he was advised to return to the emergency department given that his ALF is not able to administer IV antibiotic therapy.  I was told by ED provider that [facility] pharmacy was consulted with this morning and recommended that he complete a course of cefepime IV therapy. Patient completed 5 day course of cefepime without any complications.  PT recommended SNF with which he agreed. ?

## 2021-07-17 NOTE — ED Provider Notes (Signed)
?[facility] EMERGENCY DEPARTMENT ?Provider Note ? ? ?CSN: [id number] ?Arrival date & time: 07/17/21  1026 ? ?  ? ?History ? ?Chief Complaint  ?Patient presents with  ? Blood Infection  ? ? ?[name] is a 76 y.o. male. ? ?Patient with history of hypertension and recent back surgery.  He was seen in the emergency department recently for urinary tract infection.  The urine cultures have come back and he has a UTI of Pseudomonas that is only sensitive to IV antibiotics.  Patient has a history of hypertension ? ?The history is provided by the patient and medical records. No language interpreter was used.  ?Weakness ?Severity:  Moderate ?Onset quality:  Sudden ?Progression:  Waxing and waning ?Chronicity:  New ?Context: not alcohol use   ?Relieved by:  Nothing ?Worsened by:  Nothing ?Ineffective treatments:  None tried ?Associated symptoms: no abdominal pain, no chest pain, no cough, no diarrhea, no frequency, no headaches and no seizures   ? ?  ? ?Home Medications ?Prior to Admission medications   ?Medication Sig Start Date End Date Taking? Authorizing Provider  ?aspirin 81 MG chewable tablet Chew 81 mg by mouth daily.   Yes [provider]  ?benazepril (LOTENSIN) 20 MG tablet Take 20 mg by mouth daily.   Yes [provider]  ?Cholecalciferol (VITAMIN D3) 1.25 MG (50000 UT) TABS Take 50,000 Units by mouth every Wednesday.   Yes [provider]  ?cholecalciferol (VITAMIN D3) 25 MCG (1000 UNIT) tablet Take 1,000 Units by mouth daily.   Yes [provider]  ?diazepam (VALIUM) 5 MG tablet Take 1-2 tablets (5-10 mg total) by mouth every 6 (six) hours as needed for muscle spasms. ?Patient taking differently: Take 5 mg by mouth every 6 (six) hours as needed for muscle spasms. 07/01/21  Yes Barnett Abu, MD  ?DULoxetine (CYMBALTA) 60 MG capsule Take 60 mg by mouth daily. 12/28/18  Yes [provider]  ?HYDROcodone-acetaminophen (NORCO) 10-325 MG tablet Take 1 tablet by mouth  every 4 (four) hours as needed for moderate pain ((score 4 to 6)). 07/01/21  Yes Barnett Abu, MD  ?levothyroxine (SYNTHROID) 150 MCG tablet Take 150 mcg by mouth daily before breakfast. 05/24/21  Yes [provider]  ?loperamide (IMODIUM) 2 MG capsule Take 2 mg by mouth as needed for diarrhea or loose stools. One capsule with each loose stool - max of 5 doses in 24 hours.   Yes [provider]  ?simvastatin (ZOCOR) 20 MG tablet Take 20 mg by mouth at bedtime.   Yes [provider]  ?tamsulosin (FLOMAX) 0.4 MG CAPS capsule Take 1 capsule (0.4 mg total) by mouth in the morning and at bedtime. 07/13/21  Yes [name], [name], MD  ?trimethoprim (TRIMPEX) 100 MG tablet Take 1 tablet (100 mg total) by mouth at bedtime. 07/13/21  Yes [name], [name], MD  ?vitamin B-12 (CYANOCOBALAMIN) 1000 MCG tablet Take 1,000 mcg by mouth daily.   Yes [provider]  ?   ? ?Allergies    ?Sulfa antibiotics   ? ?Review of Systems   ?Review of Systems  ?Constitutional:  Negative for appetite change and fatigue.  ?HENT:  Negative for congestion, ear discharge and sinus pressure.   ?Eyes:  Negative for discharge.  ?Respiratory:  Negative for cough.   ?Cardiovascular:  Negative for chest pain.  ?Gastrointestinal:  Negative for abdominal pain and diarrhea.  ?Genitourinary:  Negative for frequency and hematuria.  ?Musculoskeletal:  Negative for back pain.  ?Skin:  Negative for rash.  ?Neurological:  Positive for weakness. Negative for seizures and headaches.  ?Psychiatric/Behavioral:  Negative for hallucinations.   ? ?Physical Exam ?Updated Vital Signs ?BP 108/61 (BP Location: Right Arm)   Pulse 80   Temp 97.7 ?F (36.5 ?C) (Oral)   Resp 19   Ht 6\' 7"  (2.007 m)   Wt 108 kg   SpO2 100%   BMI 26.81 kg/m?  ?Physical Exam ?Vitals and nursing note reviewed.  ?Constitutional:   ?   Appearance: He is well-developed.  ?HENT:  ?   Head: Normocephalic.  ?   Nose: Nose normal.  ?Eyes:  ?   General: No scleral  icterus. ?   Conjunctiva/sclera: Conjunctivae normal.  ?Neck:  ?   Thyroid: No thyromegaly.  ?Cardiovascular:  ?   Rate and Rhythm: Normal rate and regular rhythm.  ?   Heart sounds: No murmur heard. ?  No friction rub. No gallop.  ?Pulmonary:  ?   Breath sounds: No stridor. No wheezing or rales.  ?Chest:  ?   Chest wall: No tenderness.  ?Abdominal:  ?   General: There is no distension.  ?   Tenderness: There is no abdominal tenderness. There is no rebound.  ?Musculoskeletal:     ?   General: Normal range of motion.  ?   Cervical back: Neck supple.  ?Lymphadenopathy:  ?   Cervical: No cervical adenopathy.  ?Skin: ?   Findings: No erythema or rash.  ?Neurological:  ?   Mental Status: He is alert and oriented to person, place, and time.  ?   Motor: No abnormal muscle tone.  ?   Coordination: Coordination normal.  ?Psychiatric:     ?   Behavior: Behavior normal.  ? ? ?ED Results / Procedures / Treatments   ?Labs ?(all labs ordered are listed, but only abnormal results are displayed) ?Labs Reviewed  ?CBC WITH DIFFERENTIAL/PLATELET - Abnormal; Notable for the following components:  ?    Result Value  ? RBC 3.11 (*)   ? Hemoglobin 9.7 (*)   ? HCT 30.0 (*)   ? All other components within normal limits  ?COMPREHENSIVE METABOLIC PANEL - Abnormal; Notable for the following components:  ? Glucose, Bld 117 (*)   ? AST 13 (*)   ? Alkaline Phosphatase 154 (*)   ? All other components within normal limits  ? ? ?EKG ?None ? ?Radiology ?No results found. ? ?Procedures ?Procedures  ? ? ?Medications Ordered in ED ?Medications  ?ceFEPIme (MAXIPIME) 1 g in sodium chloride 0.9 % 100 mL IVPB (1 g Intravenous New Bag/Given 07/17/21 1158)  ? ? ?ED Course/ Medical Decision Making/ A&P ?  ?                        ?Medical Decision Making ?Amount and/or Complexity of Data Reviewed ?Labs: ordered. ? ?Risk ?Decision regarding hospitalization. ? ?This patient presents to the ED for concern of urinary tract infection, this involves an extensive  number of treatment options, and is a complaint that carries with it a high risk of complications and morbidity.  The differential diagnosis includes UTI ? ? ?Co morbidities that complicate the patient evaluation ? ?Hypertension ? ? ?Additional history obtained: ? ?Additional history obtained from patient ?External records from outside source obtained and reviewed including hospital records ? ? ?Lab Tests: ? ?I Ordered, and personally interpreted labs.  The pertinent results include: CBC which shows hemoglobin low at 9.7 chemistries unremarkable ? ? ?  Imaging Studies ordered: ?No x-rays ?Cardiac Monitoring: / EKG: ? ?The patient was maintained on a cardiac monitor.  I personally viewed and interpreted the cardiac monitored which showed an underlying rhythm of: Normal sinus rhythm ? ? ?Consultations Obtained: ? ?I requested consultation with the hospitalist,  and discussed lab and imaging findings as well as pertinent plan - they recommend: Admission ? ? ?Problem List / ED Course / Critical interventions / Medication management ? ?Hypertension and UTI ?I ordered medication including Maxipime for UTI ?Reevaluation of the patient after these medicines showed that the patient stayed the same ?I have reviewed the patients home medicines and have made adjustments as needed ? ? ?Social Determinants of Health: ? ?Lives in nursing home ? ? ?Test / Admission - Considered: ? ?None ? ?Patient with urinary tract infection.  He is nontoxic and will be admitted for IV antibiotics for 3 days ? ? ? ? ? ? ? ?Final Clinical Impression(s) / ED Diagnoses ?Final diagnoses:  ?Acute cystitis without hematuria  ? ? ?Rx / DC Orders ?ED Discharge Orders   ? ? None  ? ?  ? ? ?  ?Bethann BerkshireZammit, Camryn Quesinberry, MD ?07/19/21 1010 ? ?

## 2021-07-17 NOTE — ED Triage Notes (Signed)
Patient seen on Sunday and diagnosed with UTI. Called back today due to positive blood cultures.  ?

## 2021-07-17 NOTE — Assessment & Plan Note (Addendum)
--   Follow-up blood cultures x2 ?-- IV cefepime--continue x 5 days total ?Last day is 4/8 ?

## 2021-07-17 NOTE — Assessment & Plan Note (Signed)
--  pt is followed by Dr. Ronne Binning and remains on Flomax twice daily ?

## 2021-07-17 NOTE — Assessment & Plan Note (Addendum)
--   holding benazepril due to soft BPs ?-BP remained well controlled ?-will not restart benazepril ?

## 2021-07-17 NOTE — Patient Instructions (Signed)

## 2021-07-17 NOTE — ED Provider Notes (Signed)
I was contacted by the pharmacist the patient's urine culture results appear to show 100,000 colony units of Pseudomonas which unfortunately is resistant to flora quinolones, no good option for outpatient oral treatment.  However he is at a SNF. I contacted Ronnie Derby CM will reach out to SNF to see if they are able to start an IV, if so he would need IV cefepime 1 g Q8H x 3 days per my discussion with the pharmacist.  If not, unfortunately he may need to return to the hospital for PICC line placement or IV antibiotics. ?  ?Terald Sleeper, MD ?07/17/21 0831 ? ?

## 2021-07-17 NOTE — Assessment & Plan Note (Addendum)
--   resume home bronchodilators ?--stable on RA ?

## 2021-07-17 NOTE — H&P (Addendum)
History and Physical  [name]  [name] FAO:[id number] DOB: 1946-01-30 DOA: 07/17/2021  PCP: Galvin Proffer, MD  Patient coming from: [facility]  Level of care: Med-Surg  I have personally briefly reviewed patient's old medical records in [facility]  Chief Complaint: UTI with low blood pressures   HPI: [name] is a 76 year old gentleman with history of multiple prior spinal surgeries recently status post having a L3-5 decompression and fusion surgery with Dr. Lelon Perla on 06/29/2021.  He did well postoperatively and was discharged on 07/01/2021 to his ALF.  He received home health PT services.  He has a history of recurrent UTIs and BPH and followed by Dr. Ronne Key in [place].  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  He also has a history of incomplete bladder emptying and it is managed with Flomax twice daily.  He presented to the emergency department on 07/14/2021 from ALF with complaints of low blood pressures.  He was given IV fluids in the emergency department and his hypotension resolved.  His work-up was significant for a urinary tract infection and he was discharged home.  His urine culture came back positive for multidrug-resistant Pseudomonas infection and he was advised to return to the emergency department given that his ALF is not able to administer IV antibiotic therapy.  I was told by ED provider that ID pharmacy was consulted with this morning and recommended that he complete a course of cefepime IV therapy.  Review of Systems: Review of Systems  Constitutional:  Positive for diaphoresis and malaise/fatigue. Negative for fever.  HENT: Negative.    Eyes: Negative.   Respiratory: Negative.    Cardiovascular: Negative.   Gastrointestinal: Negative.   Genitourinary: Negative.   Musculoskeletal: Negative.   Skin: Negative.   Neurological: Negative.    Endo/Heme/Allergies: Negative.   Psychiatric/Behavioral: Negative.    All other systems reviewed and are negative.   Past Medical History:  Diagnosis Date   Arthritis    Bronchitis    hx of   COPD (chronic obstructive pulmonary disease) (HCC)    GERD (gastroesophageal reflux disease)    History of kidney stones    Hypertension    Hypothyroidism    Macular degeneration    right eye   Restless leg syndrome     Past Surgical History:  Procedure Laterality Date   BACK SURGERY     Fusion 2005   Graves Disease     JOINT REPLACEMENT  1970   Right knee    LAMINECTOMY WITH POSTERIOR LATERAL ARTHRODESIS LEVEL 2 N/A 06/29/2021   Procedure: Laminectomy - Lumbar one-two, Lumbar two-three - redo with posterior lateral fusion with pedicle screws;  Surgeon: Andrew Sicks, MD;  Location: [facility] OR;  Service: Neurosurgery;  Laterality: N/A;   NECK SURGERY     POSTERIOR CERVICAL FUSION/FORAMINOTOMY  12/05/2011   Procedure: POSTERIOR CERVICAL FUSION/FORAMINOTOMY LEVEL 4;  Surgeon: Andrew Meeker, MD;  Location: [facility] NEURO ORS;  Service: Neurosurgery;  Laterality: N/A;  Posterior Cervical fixation Cervical four-Seven,Cervical Decompresson Cervical three   THORACIC DISCECTOMY N/A 01/08/2021   Procedure: Thoracic One-Two, Thoracic Two-Three, Thoracic Eleven-Twelve, Thoracic Twelve-Lumbar One Laminectomy;  Surgeon: Andrew Sicks, MD;  Location: [facility] OR;  Service: Neurosurgery;  Laterality: N/A;     reports that he has been smoking cigarettes. He has a 12.50 pack-year smoking history. He has never used smokeless tobacco. He reports that he  does not drink alcohol and does not use drugs.  Allergies  Allergen Reactions   Sulfa Antibiotics Other (See Comments)    Causes blisters and skin to peel from inside mouth    Family History  Problem Relation Age of Onset   Heart attack Mother 61   Heart disease Father     Prior to Admission medications   Medication Sig Start Date End Date Taking? Authorizing Provider   aspirin 81 MG chewable tablet Chew 81 mg by mouth daily.   Yes [provider]  benazepril (LOTENSIN) 20 MG tablet Take 20 mg by mouth daily.   Yes [provider]  Cholecalciferol (VITAMIN D3) 1.25 MG (50000 UT) TABS Take 50,000 Units by mouth every Wednesday.   Yes [provider]  cholecalciferol (VITAMIN D3) 25 MCG (1000 UNIT) tablet Take 1,000 Units by mouth daily.   Yes [provider]  diazepam (VALIUM) 5 MG tablet Take 1-2 tablets (5-10 mg total) by mouth every 6 (six) hours as needed for muscle spasms. Patient taking differently: Take 5 mg by mouth every 6 (six) hours as needed for muscle spasms. 07/01/21  Yes Andrew Abu, MD  DULoxetine (CYMBALTA) 60 MG capsule Take 60 mg by mouth daily. 12/28/18  Yes [provider]  HYDROcodone-acetaminophen (NORCO) 10-325 MG tablet Take 1 tablet by mouth every 4 (four) hours as needed for moderate pain ((score 4 to 6)). 07/01/21  Yes Andrew Abu, MD  levothyroxine (SYNTHROID) 150 MCG tablet Take 150 mcg by mouth daily before breakfast. 05/24/21  Yes [provider]  loperamide (IMODIUM) 2 MG capsule Take 2 mg by mouth as needed for diarrhea or loose stools. One capsule with each loose stool - max of 5 doses in 24 hours.   Yes [provider]  simvastatin (ZOCOR) 20 MG tablet Take 20 mg by mouth at bedtime.   Yes [provider]  tamsulosin (FLOMAX) 0.4 MG CAPS capsule Take 1 capsule (0.4 mg total) by mouth in the morning and at bedtime. 07/13/21  Yes [name], [name], MD  trimethoprim (TRIMPEX) 100 MG tablet Take 1 tablet (100 mg total) by mouth at bedtime. 07/13/21  Yes [name], [name], MD  vitamin B-12 (CYANOCOBALAMIN) 1000 MCG tablet Take 1,000 mcg by mouth daily.   Yes [provider]    Physical Exam: Vitals:   07/17/21 1630 07/17/21 1700 07/17/21 1721 07/17/21 1738  BP: (!) 142/76 125/66  127/75  Pulse: 76 75  83  Resp:  17    Temp:   (!) 97.3 F (36.3 C)  (!) 97.5 F (36.4 C)  TempSrc:   Oral Oral  SpO2: 100% 96%  99%  Weight:      Height:        Constitutional: very tall male, cooperative, NAD, calm, comfortable Eyes: PERRL, lids and conjunctivae normal ENMT: Mucous membranes are moist. Posterior pharynx clear of any exudate or lesions.  Neck: normal, supple, no masses, no thyromegaly Respiratory: clear to auscultation bilaterally, no wheezing, no crackles. Normal respiratory effort. No accessory muscle use.  Cardiovascular: normal s1, s2 sounds, no murmurs / rubs / gallops. No extremity edema. 2+ pedal pulses. No carotid bruits.  Abdomen: no tenderness, no masses palpated. No hepatosplenomegaly. Bowel sounds positive.  Musculoskeletal: no clubbing / cyanosis. No joint deformity upper and lower extremities. Good ROM, no contractures. Normal muscle tone.  Skin: no rashes, lesions, ulcers. No induration Neurologic: CN 2-12 grossly intact. Sensation intact, DTR normal. Strength 5/5 in all 4.  Psychiatric:  Normal judgment and insight. Alert and oriented x 3. Normal mood.   Labs on Admission: I have personally reviewed following labs and imaging studies  CBC: Recent Labs  Lab 07/14/21 1421 07/17/21 1113  WBC 7.9 9.2  9.0  NEUTROABS 6.2 6.9  HGB 9.3* 9.7*  9.7*  HCT 29.4* 31.0*  30.0*  MCV 96.4 99.0  96.5  PLT 239 246  233   Basic Metabolic Panel: Recent Labs  Lab 07/14/21 1421 07/17/21 1113  NA 140 138  K 4.7 4.7  CL 106 106  CO2 26 23  GLUCOSE 129* 117*  BUN 28* 21  CREATININE 1.44* 1.18  CALCIUM 9.0 9.0   GFR: Estimated Creatinine Clearance: 73.9 mL/min (by C-G formula based on SCr of 1.18 mg/dL). Liver Function Tests: Recent Labs  Lab 07/14/21 1421 07/17/21 1113  AST 12* 13*  ALT 9 9  ALKPHOS 137* 154*  BILITOT 0.4 0.4  PROT 6.8 7.0  ALBUMIN 3.6 3.8   No results for input(s): LIPASE, AMYLASE in the last 168 hours. No results for input(s): AMMONIA in the last 168 hours. Coagulation Profile: No  results for input(s): INR, PROTIME in the last 168 hours. Cardiac Enzymes: No results for input(s): CKTOTAL, CKMB, CKMBINDEX, TROPONINI in the last 168 hours. BNP (last 3 results) No results for input(s): PROBNP in the last 8760 hours. HbA1C: No results for input(s): HGBA1C in the last 72 hours. CBG: No results for input(s): GLUCAP in the last 168 hours. Lipid Profile: No results for input(s): CHOL, HDL, LDLCALC, TRIG, CHOLHDL, LDLDIRECT in the last 72 hours. Thyroid Function Tests: No results for input(s): TSH, T4TOTAL, FREET4, T3FREE, THYROIDAB in the last 72 hours. Anemia Panel: No results for input(s): VITAMINB12, FOLATE, FERRITIN, TIBC, IRON, RETICCTPCT in the last 72 hours. Urine analysis:    Component Value Date/Time   COLORURINE YELLOW 07/14/2021 1645   APPEARANCEUR CLEAR 07/14/2021 1645   APPEARANCEUR Hazy (A) 06/11/2021 1122   LABSPEC 1.015 07/14/2021 1645   PHURINE 7.0 07/14/2021 1645   GLUCOSEU NEGATIVE 07/14/2021 1645   HGBUR SMALL (A) 07/14/2021 1645   BILIRUBINUR NEGATIVE 07/14/2021 1645   BILIRUBINUR Negative 06/11/2021 1122   KETONESUR NEGATIVE 07/14/2021 1645   PROTEINUR NEGATIVE 07/14/2021 1645   NITRITE POSITIVE (A) 07/14/2021 1645   LEUKOCYTESUR MODERATE (A) 07/14/2021 1645    Radiological Exams on Admission: No results found.  Assessment/Plan Principal Problem:   Pseudomonas UTI infection Active Problems:   COPD (chronic obstructive pulmonary disease) (HCC)   HTN (hypertension)   Incomplete bladder emptying   Complicated UTI (urinary tract infection)   Assessment and Plan: * Pseudomonas UTI infection -- Follow-up blood cultures x2 -- IV cefepime ordered, reassess in AM  Complicated UTI (urinary tract infection) -- unfortunately we were not able to arrange an outpatient treatment option. -- his ALF would not accept PICC line and his infection only treated by IV antibiotics  Incomplete bladder emptying --pt is followed by Dr. Ronne Key and  remains on Flomax twice daily  HTN (hypertension) -- resume home medications  COPD (chronic obstructive pulmonary disease) (HCC) -- resume home bronchodilators   DVT prophylaxis: enoxaparin  Code Status: Full   Family Communication:   Disposition Plan: return to [facility]   Consults called:   Admission status: INP  Level of care: Med-Surg Standley Dakins MD [facility] How to contact the [facility] Attending or Consulting provider 7A - 7P or covering provider during after hours 7P -7A, for this patient?  Check the care team in [facility]  and look for a) attending/consulting [facility] provider listed and b) the [facility] team listed Log into [email or web address] and use [place]'s universal password to access. If you do not have the password, please contact the hospital operator. Locate the [facility] provider you are looking for under [facility] and page to a number that you can be directly reached. If you still have difficulty reaching the provider, please page the [facility] (Director on Call) for the Hospitalists listed on amion for assistance.   If 7PM-7AM, please contact night-coverage [email or web address] Password [facility]  07/17/2021, 6:13 PM

## 2021-07-17 NOTE — Assessment & Plan Note (Signed)
--   unfortunately we were not able to arrange an outpatient treatment option. ?-- his ALF would not accept PICC line and his infection only treated by IV antibiotics ?

## 2021-07-17 NOTE — Plan of Care (Signed)
  Problem: Education: Goal: Knowledge of General Education information will improve Description: Including pain rating scale, medication(s)/side effects and non-pharmacologic comfort measures Outcome: Progressing   Problem: Nutrition: Goal: Adequate nutrition will be maintained Outcome: Progressing   Problem: Elimination: Goal: Will not experience complications related to urinary retention Outcome: Progressing   

## 2021-07-18 DIAGNOSIS — M48062 Spinal stenosis, lumbar region with neurogenic claudication: Secondary | ICD-10-CM

## 2021-07-18 DIAGNOSIS — R339 Retention of urine, unspecified: Secondary | ICD-10-CM | POA: Diagnosis not present

## 2021-07-18 DIAGNOSIS — J438 Other emphysema: Secondary | ICD-10-CM | POA: Diagnosis not present

## 2021-07-18 DIAGNOSIS — N3000 Acute cystitis without hematuria: Secondary | ICD-10-CM

## 2021-07-18 DIAGNOSIS — A498 Other bacterial infections of unspecified site: Secondary | ICD-10-CM | POA: Diagnosis not present

## 2021-07-18 DIAGNOSIS — E039 Hypothyroidism, unspecified: Secondary | ICD-10-CM

## 2021-07-18 NOTE — Progress Notes (Signed)
Initial Nutrition Assessment ? ?DOCUMENTATION CODES:  ? ?  ? ?INTERVENTION:  ?Ensure Enlive po BID  ? ?Dysphagia 3 diet  ? ?NUTRITION DIAGNOSIS:  ? ?Inadequate oral intake related to acute illness as evidenced by per patient/family report. ? ? ?GOAL:  ?Patient will meet greater than or equal to 90% of their needs ? ? ?MONITOR:  ?PO intake, Supplement acceptance, Labs, Weight trends ? ?REASON FOR ASSESSMENT:  ? ?Malnutrition Screening Tool ?  ? ?ASSESSMENT: Patient is a 76 yo male with hx of COPD,Bronchitis, Macular degeneration, GERD, recurrent UTI's and Hypertension. He presents with UTI.  ? ?Meal completion-Dysphagia 3 diet: 100% x 2. Feeding himself. Patient endorses poor appetite since September but is eating very well at the moment.  ? ?He likes a wide variety of "heart healthy" foods and drinks carbonated flavored water primarily and cranberry juice. Encouraged protein at each meal and > 2 liters fluid daily.  ? ?Patient reports usual weight 240-260 lb. Currently 237.6 lb (108 kg). Mild depletions of muscle and fat mass noted.  ? ?Labs reviewed. 4/4- Hgb 9.7, glucose 117.  ? ?Medications:  Vitamin B-12 (1000 mcg/d), Synthoid, flomax.  ? ?NUTRITION - FOCUSED PHYSICAL EXAM: ? ?Flowsheet Row Most Recent Value  ?Orbital Region Mild depletion  ?Upper Arm Region Mild depletion  ?Thoracic and Lumbar Region No depletion  ?Buccal Region No depletion  ?Temple Region Mild depletion  ?Clavicle Bone Region Mild depletion  ?Clavicle and Acromion Bone Region Mild depletion  ?Scapular Bone Region No depletion  ?Dorsal Hand No depletion  ?Patellar Region Mild depletion  ?Anterior Thigh Region No depletion  ?Posterior Calf Region Mild depletion  ?Edema (RD Assessment) None  ?Hair Reviewed  ?Eyes Reviewed  ?Mouth Reviewed  ?Skin Reviewed  ?Nails Reviewed  ? ?  ? ?Diet Order:   ?Diet Order   ? ?       ?  DIET DYS 3 Room service appropriate? Yes; Fluid consistency: Thin  Diet effective now       ?  ? ?  ?  ? ?  ? ? ?EDUCATION  NEEDS:  ?Education needs have been addressed ? ?Skin:  Skin Assessment: Reviewed RN Assessment ? ?Last BM:  4/4 type 6- large ? ?Height:  ? ?Ht Readings from Last 1 Encounters:  ?07/17/21 6\' 7"  (2.007 m)  ? ? ?Weight:  ? ?Wt Readings from Last 1 Encounters:  ?07/17/21 108 kg  ? ? ?Ideal Body Weight:   95 kg ? ?BMI:  Body mass index is 26.81 kg/m?. ? ?Estimated Nutritional Needs:  ? ?Kcal:  2200-2400 ? ?Protein:  115- 123 gr ? ?Fluid:  > 2 liters ? ? ?09/16/21 MS,RD,CSG,LDN ?Contact: AMION  ? ?

## 2021-07-18 NOTE — Assessment & Plan Note (Addendum)
-  underwent decompression at L1-2 and L2-3 with revision of fusion by Dr. Jordan Likes on 06/29/2021.  ?-Removal of old L3 pedicle screws bilaterally. ?Posterior lateral arthrodesis utilizing segmental pedicle screw fixation and local autografting from L1-L3 ?-PT eval>>SNF ?

## 2021-07-18 NOTE — Assessment & Plan Note (Signed)
Continue synthroid.

## 2021-07-18 NOTE — Care Management Important Message (Signed)
Important Message ? ?Patient Details  ?Name: [name] ?MRN: [id number] ?Date of Birth: 1946/02/12 ? ? ?Medicare Important Message Given:  Yes ? ? ? ? ?Tommy Medal ?07/18/2021, 11:48 AM ?

## 2021-07-18 NOTE — Progress Notes (Signed)
Patient is pleasant and content, denies any needs. He has refused ambulation and transferring to the chair today but states he would once his wife brings clean clothes and depends.  ?

## 2021-07-18 NOTE — Progress Notes (Signed)
?  ?       ?PROGRESS NOTE ? ?[name] [id number] DOB: 12/27/1945 DOA: 07/17/2021 ?PCP: Bonnita Nasuti, MD ? ?Brief History:  ?76 year old gentleman with history of multiple prior spinal surgeries recently status post having a L3-5 decompression and fusion surgery with Dr. Deri Fuelling on 06/29/2021.  He did well postoperatively and was discharged on 07/01/2021 to his ALF.  He received home health PT services.  He has a history of recurrent UTIs and BPH and followed by Dr. Alyson Ingles in [place].  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  He also has a history of incomplete bladder emptying and it is managed with Flomax twice daily.  He presented to the emergency department on 07/14/2021 from ALF with complaints of low blood pressures.  He was given IV fluids in the emergency department and his hypotension resolved.  His work-up was significant for a urinary tract infection and he was discharged home.  His urine culture came back positive for multidrug-resistant Pseudomonas infection and he was advised to return to the emergency department given that his ALF is not able to administer IV antibiotic therapy.  I was told by ED provider that ID pharmacy was consulted with this morning and recommended that he complete a course of cefepime IV therapy.  ? ? ? ?Assessment and Plan: ?* Pseudomonas UTI infection ?-- Follow-up blood cultures x2 ?-- IV cefepime ordered, reassess in AM ? ?Hypothyroidism ?Continue synthroid ? ?Lumbar stenosis with neurogenic claudication ?-underwent decompression at L1-2 and L2-3 with revision of fusion by Dr. Annette Stable on 06/29/2021.  ?-Removal of old L3 pedicle screws bilaterally.  Posterior lateral arthrodesis utilizing segmental pedicle screw fixation and local autografting from L1-L3 ?-PT eval ? ?Incomplete bladder emptying ?--pt is followed by Dr. Alyson Ingles and remains on Flomax twice daily ? ?HTN (hypertension) ?--  holding benazepril due to soft BPs ?-judicious IVF ? ?COPD (chronic obstructive pulmonary disease) ([place]) ?-- resume home bronchodilators ?--stable on RA ? ? ? ? ? ? ? ? ?Status is: Inpatient ?Remains inpatient appropriate because: severity of illness requiring IVF and IV antibiotics ? ? ? ?Family Communication:   no Family at bedside ? ?Consultants:  none ? ?Code Status:  FULL  ? ?DVT Prophylaxis:   [place] Lovenox ? ? ?Procedures: ?As Listed in Progress Note Above ? ?Antibiotics: ?Cefepime 4/4>> ? ? ? ? ? ?Subjective: ?Patient denies fevers, chills, headache, chest pain, dyspnea, nausea, vomiting, diarrhea, abdominal pain, dysuria, hematuria, hematochezia, and melena. ? ? ?Objective: ?Vitals:  ? 07/17/21 1738 07/17/21 2233 07/18/21 [id number] 07/18/21 0754  ?BP: 127/75 (!) 95/58 (!) 105/58 (!) 91/59  ?Pulse: 83 70 82 79  ?Resp:  18 18   ?Temp: (!) 97.5 ?F (36.4 ?C) 97.9 ?F (36.6 ?C) (!) 97.5 ?F (36.4 ?C)   ?TempSrc: Oral Oral Oral   ?SpO2: 99% 100% 98% 99%  ?Weight:      ?Height:      ? ? ?Intake/Output Summary (Last 24 hours) at 07/18/2021 0902 ?Last data filed at 07/18/2021 [id number] ?Gross per 24 hour  ?Intake 1593.1 ml  ?Output 850 ml  ?Net 743.1 ml  ? ?Weight change:  ?Exam: ? ?General:  Pt is alert, follows commands appropriately, not in acute distress ?HEENT: No icterus, No thrush, No neck mass, [place]/AT ?Cardiovascular: RRR, S1/S2, no rubs, no gallops ?Respiratory: diminished BS.  Bibasilar rales. No wheeze ?Abdomen: Soft/+BS, non tender, non distended, no guarding ?Extremities: No  edema, No lymphangitis, No petechiae, No rashes, no synovitis ? ? ?Data Reviewed: ?I have personally reviewed following labs and imaging studies ?Basic Metabolic Panel: ?Recent Labs  ?Lab 07/14/21 ?1421 07/17/21 ?1113  ?NA 140 138  ?K 4.7 4.7  ?CL 106 106  ?CO2 26 23  ?GLUCOSE 129* 117*  ?BUN 28* 21  ?CREATININE 1.44* 1.15  1.18  ?CALCIUM 9.0 9.0  ? ?Liver Function Tests: ?Recent Labs  ?Lab 07/14/21 ?1421 07/17/21 ?1113  ?AST 12* 13*  ?ALT 9 9   ?ALKPHOS 137* 154*  ?BILITOT 0.4 0.4  ?PROT 6.8 7.0  ?ALBUMIN 3.6 3.8  ? ?No results for input(s): LIPASE, AMYLASE in the last 168 hours. ?No results for input(s): AMMONIA in the last 168 hours. ?Coagulation Profile: ?No results for input(s): INR, PROTIME in the last 168 hours. ?CBC: ?Recent Labs  ?Lab 07/14/21 ?1421 07/17/21 ?1113  ?WBC 7.9 9.2  9.0  ?NEUTROABS 6.2 6.9  ?HGB 9.3* 9.7*  9.7*  ?HCT 29.4* 31.0*  30.0*  ?MCV 96.4 99.0  96.5  ?PLT 239 246  233  ? ?Cardiac Enzymes: ?No results for input(s): CKTOTAL, CKMB, CKMBINDEX, TROPONINI in the last 168 hours. ?BNP: ?Invalid input(s): POCBNP ?CBG: ?No results for input(s): GLUCAP in the last 168 hours. ?HbA1C: ?No results for input(s): HGBA1C in the last 72 hours. ?Urine analysis: ?   ?Component Value Date/Time  ? [place] YELLOW 07/14/2021 1645  ? APPEARANCEUR CLEAR 07/14/2021 1645  ? APPEARANCEUR Hazy (A) 06/11/2021 1122  ? LABSPEC 1.015 07/14/2021 1645  ? PHURINE 7.0 07/14/2021 1645  ? GLUCOSEU NEGATIVE 07/14/2021 1645  ? HGBUR SMALL (A) 07/14/2021 1645  ? [place] NEGATIVE 07/14/2021 1645  ? BILIRUBINUR Negative 06/11/2021 1122  ? [place] NEGATIVE 07/14/2021 1645  ? [place] NEGATIVE 07/14/2021 1645  ? NITRITE POSITIVE (A) 07/14/2021 1645  ? LEUKOCYTESUR MODERATE (A) 07/14/2021 1645  ? ?Sepsis Labs: ?@LABRCNTIP (procalcitonin:4,lacticidven:4) ?) ?Recent Results (from the past 240 hour(s))  ?Culture, blood (routine x 2)     Status: None (Preliminary result)  ? Collection Time: 07/14/21  3:30 PM  ? Specimen: Left Antecubital; Blood  ?Result Value Ref Range Status  ? Specimen Description   Final  ?  LEFT ANTECUBITAL BOTTLES DRAWN AEROBIC AND ANAEROBIC  ? Special Requests   Final  ?  Blood Culture results may not be optimal due to an excessive volume of blood received in culture bottles  ? Culture   Final  ?  NO GROWTH 3 DAYS ?Performed at [facility], [address], [place], [place] [address] ?  ? Report Status PENDING  Incomplete  ?Culture, blood  (routine x 2)     Status: None (Preliminary result)  ? Collection Time: 07/14/21  3:30 PM  ? Specimen: BLOOD LEFT HAND  ?Result Value Ref Range Status  ? Specimen Description   Final  ?  BLOOD LEFT HAND BOTTLES DRAWN AEROBIC AND ANAEROBIC  ? Special Requests Blood Culture adequate volume  Final  ? Culture   Final  ?  NO GROWTH 3 DAYS ?Performed at [facility], [address], [place], [place] [address] ?  ? Report Status PENDING  Incomplete  ?Urine Culture     Status: Abnormal  ? Collection Time: 07/14/21  4:45 PM  ? Specimen: Urine, Clean Catch  ?Result Value Ref Range Status  ? Specimen Description   Final  ?  URINE, CLEAN CATCH ?Performed at [facility], [address], [place], [place] [address] ?  ? Special Requests   Final  ?  NONE ?Performed at [facility], [address], [place], [place] [address] ?  ? Culture >=100,000 COLONIES/mL PSEUDOMONAS AERUGINOSA (A)  Final  ? Report Status 07/16/2021 FINAL  Final  ? Organism ID, Bacteria PSEUDOMONAS AERUGINOSA (A)  Final  ?    Susceptibility  ? Pseudomonas aeruginosa - MIC*  ?  CEFTAZIDIME <=1 SENSITIVE Sensitive   ?  CIPROFLOXACIN 2 RESISTANT Resistant   ?  GENTAMICIN <=1 SENSITIVE Sensitive   ?  IMIPENEM >=16 RESISTANT Resistant   ?  PIP/TAZO <=4 SENSITIVE Sensitive   ?  CEFEPIME 0.5 SENSITIVE Sensitive   ?  * >=100,000 COLONIES/mL PSEUDOMONAS AERUGINOSA  ?  ? ?Scheduled Meds: ? aspirin  81 mg Oral Daily  ? DULoxetine  60 mg Oral Daily  ? enoxaparin (LOVENOX) injection  40 mg Subcutaneous Q24H  ? feeding supplement  237 mL Oral BID BM  ? levothyroxine  150 mcg Oral QAC breakfast  ? simvastatin  20 mg Oral QHS  ? tamsulosin  0.4 mg Oral BID  ? vitamin B-12  1,000 mcg Oral Daily  ? ?Continuous Infusions: ? ceFEPime (MAXIPIME) IV Stopped (07/18/21 [id number])  ? lactated ringers 50 mL/hr at 07/18/21 [id number]  ? ? ?Procedures/Studies: ?DG Chest 2 View ? ?Result Date: 07/14/2021 ?CLINICAL DATA:  Hypotension.  Recent back surgery. EXAM: CHEST - 2 VIEW COMPARISON:  01/31/2019 chest  radiograph FINDINGS: The cardiomediastinal silhouette is unremarkable. There is no evidence of focal airspace disease, pulmonary edema, suspicious pulmonary nodule/mass, pleural effusion, or pneumothorax. No acute

## 2021-07-19 LAB — CULTURE, BLOOD (ROUTINE X 2)
Culture: NO GROWTH
Culture: NO GROWTH
Special Requests: ADEQUATE

## 2021-07-19 NOTE — Progress Notes (Signed)
?  ?       ?PROGRESS NOTE ? ?[name] [id number] DOB: 12/02/1945 DOA: 07/17/2021 ?PCP: Bonnita Nasuti, MD ? ?Brief History:  ?76 year old gentleman with history of multiple prior spinal surgeries recently status post having a L3-5 decompression and fusion surgery with Dr. Deri Fuelling on 06/29/2021.  He did well postoperatively and was discharged on 07/01/2021 to his ALF.  He received home health PT services.  He has a history of recurrent UTIs and BPH and followed by Dr. Alyson Ingles in [place].  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  He also has a history of incomplete bladder emptying and it is managed with Flomax twice daily.  He presented to the emergency department on 07/14/2021 from ALF with complaints of low blood pressures.  He was given IV fluids in the emergency department and his hypotension resolved.  His work-up was significant for a urinary tract infection and he was discharged home.  His urine culture came back positive for multidrug-resistant Pseudomonas infection and he was advised to return to the emergency department given that his ALF is not able to administer IV antibiotic therapy.  I was told by ED provider that ID pharmacy was consulted with this morning and recommended that he complete a course of cefepime IV therapy.  ? ? ?Assessment and Plan: ?* Pseudomonas UTI infection ?-- Follow-up blood cultures x2 ?-- IV cefepime--continue x 5 days total ? ?Hypothyroidism ?Continue synthroid ? ?Lumbar stenosis with neurogenic claudication ?-underwent decompression at L1-2 and L2-3 with revision of fusion by Dr. Annette Stable on 06/29/2021.  ?-Removal of old L3 pedicle screws bilaterally.  Posterior lateral arthrodesis utilizing segmental pedicle screw fixation and local autografting from L1-L3 ?-PT eval ? ?Incomplete bladder emptying ?--pt is followed by Dr. Alyson Ingles and remains on Flomax twice daily ? ?HTN (hypertension) ?--  holding benazepril due to soft BPs ?-judicious IVF ? ?COPD (chronic obstructive pulmonary disease) ([place]) ?-- resume home bronchodilators ?--stable on RA ? ?Status is: Inpatient ?Remains inpatient appropriate because: severity of illness requiring IVF and IV antibiotics ?  ?  ?  ?Family Communication:   no Family at bedside ?  ?Consultants:  none ?  ?Code Status:  FULL  ?  ?DVT Prophylaxis:   [place] Lovenox ?  ?  ?Procedures: ?As Listed in Progress Note Above ?  ?Antibiotics: ?Cefepime 4/4>> ?  ? ? ? ? ? ? ? ? ? ? ? ?Subjective: ?Patient denies fevers, chills, headache, chest pain, dyspnea, nausea, vomiting, diarrhea, abdominal pain, dysuria, hematuria, ? ? ?Objective: ?Vitals:  ? 07/18/21 1221 07/18/21 2203 07/19/21 0510 07/19/21 1343  ?BP: (!) 102/56 (!) 113/57 106/71 116/79  ?Pulse: 75 72 86 97  ?Resp: 18 18 19 18   ?Temp: 98.1 ?F (36.7 ?C) 98.4 ?F (36.9 ?C) 98.3 ?F (36.8 ?C) (!) 97.5 ?F (36.4 ?C)  ?TempSrc: Oral   Oral  ?SpO2: 100% 100% 99% 100%  ?Weight:      ?Height:      ? ? ?Intake/Output Summary (Last 24 hours) at 07/19/2021 1740 ?Last data filed at 07/19/2021 1300 ?Gross per 24 hour  ?Intake 1320 ml  ?Output 2550 ml  ?Net -1230 ml  ? ?Weight change:  ?Exam: ? ?General:  Pt is alert, follows commands appropriately, not in acute distress ?HEENT: No icterus, No thrush, No neck mass, [place]/AT ?Cardiovascular: RRR, S1/S2, no rubs, no gallops ?Respiratory: bibasilar crackles. no wheezing, no crackles, no rhonchi ?Abdomen: Soft/+BS,  non tender, non distended, no guarding ?Extremities: No edema, No lymphangitis, No petechiae, No rashes, no synovitis ? ? ?Data Reviewed: ?I have personally reviewed following labs and imaging studies ?Basic Metabolic Panel: ?Recent Labs  ?Lab 07/14/21 ?1421 07/17/21 ?1113  ?NA 140 138  ?K 4.7 4.7  ?CL 106 106  ?CO2 26 23  ?GLUCOSE 129* 117*  ?BUN 28* 21  ?CREATININE 1.44* 1.15  1.18  ?CALCIUM 9.0 9.0  ? ?Liver Function Tests: ?Recent Labs  ?Lab 07/14/21 ?1421 07/17/21 ?1113  ?AST 12* 13*  ?ALT 9  9  ?ALKPHOS 137* 154*  ?BILITOT 0.4 0.4  ?PROT 6.8 7.0  ?ALBUMIN 3.6 3.8  ? ?No results for input(s): LIPASE, AMYLASE in the last 168 hours. ?No results for input(s): AMMONIA in the last 168 hours. ?Coagulation Profile: ?No results for input(s): INR, PROTIME in the last 168 hours. ?CBC: ?Recent Labs  ?Lab 07/14/21 ?1421 07/17/21 ?1113  ?WBC 7.9 9.2  9.0  ?NEUTROABS 6.2 6.9  ?HGB 9.3* 9.7*  9.7*  ?HCT 29.4* 31.0*  30.0*  ?MCV 96.4 99.0  96.5  ?PLT 239 246  233  ? ?Cardiac Enzymes: ?No results for input(s): CKTOTAL, CKMB, CKMBINDEX, TROPONINI in the last 168 hours. ?BNP: ?Invalid input(s): POCBNP ?CBG: ?No results for input(s): GLUCAP in the last 168 hours. ?HbA1C: ?No results for input(s): HGBA1C in the last 72 hours. ?Urine analysis: ?   ?Component Value Date/Time  ? [place] YELLOW 07/14/2021 1645  ? APPEARANCEUR CLEAR 07/14/2021 1645  ? APPEARANCEUR Hazy (A) 06/11/2021 1122  ? LABSPEC 1.015 07/14/2021 1645  ? PHURINE 7.0 07/14/2021 1645  ? GLUCOSEU NEGATIVE 07/14/2021 1645  ? HGBUR SMALL (A) 07/14/2021 1645  ? [place] NEGATIVE 07/14/2021 1645  ? BILIRUBINUR Negative 06/11/2021 1122  ? [place] NEGATIVE 07/14/2021 1645  ? [place] NEGATIVE 07/14/2021 1645  ? NITRITE POSITIVE (A) 07/14/2021 1645  ? LEUKOCYTESUR MODERATE (A) 07/14/2021 1645  ? ?Sepsis Labs: ?@LABRCNTIP (procalcitonin:4,lacticidven:4) ?) ?Recent Results (from the past 240 hour(s))  ?Culture, blood (routine x 2)     Status: None  ? Collection Time: 07/14/21  3:30 PM  ? Specimen: Left Antecubital; Blood  ?Result Value Ref Range Status  ? Specimen Description   Final  ?  LEFT ANTECUBITAL BOTTLES DRAWN AEROBIC AND ANAEROBIC  ? Special Requests   Final  ?  Blood Culture results may not be optimal due to an excessive volume of blood received in culture bottles  ? Culture   Final  ?  NO GROWTH 5 DAYS ?Performed at [facility], [address], [place], [place] [address] ?  ? Report Status 07/19/2021 FINAL  Final  ?Culture, blood (routine x 2)      Status: None  ? Collection Time: 07/14/21  3:30 PM  ? Specimen: BLOOD LEFT HAND  ?Result Value Ref Range Status  ? Specimen Description   Final  ?  BLOOD LEFT HAND BOTTLES DRAWN AEROBIC AND ANAEROBIC  ? Special Requests Blood Culture adequate volume  Final  ? Culture   Final  ?  NO GROWTH 5 DAYS ?Performed at [facility], [address], [place], [place] [address] ?  ? Report Status 07/19/2021 FINAL  Final  ?Urine Culture     Status: Abnormal  ? Collection Time: 07/14/21  4:45 PM  ? Specimen: Urine, Clean Catch  ?Result Value Ref Range Status  ? Specimen Description   Final  ?  URINE, CLEAN CATCH ?Performed at [facility], [address], [place], [place] [address] ?  ? Special Requests  Final  ?  NONE ?Performed at [facility], [address], [place], [place] [address] ?  ? Culture >=100,000 COLONIES/mL PSEUDOMONAS AERUGINOSA (A)  Final  ? Report Status 07/16/2021 FINAL  Final  ? Organism ID, Bacteria PSEUDOMONAS AERUGINOSA (A)  Final  ?    Susceptibility  ? Pseudomonas aeruginosa - MIC*  ?  CEFTAZIDIME <=1 SENSITIVE Sensitive   ?  CIPROFLOXACIN 2 RESISTANT Resistant   ?  GENTAMICIN <=1 SENSITIVE Sensitive   ?  IMIPENEM >=16 RESISTANT Resistant   ?  PIP/TAZO <=4 SENSITIVE Sensitive   ?  CEFEPIME 0.5 SENSITIVE Sensitive   ?  * >=100,000 COLONIES/mL PSEUDOMONAS AERUGINOSA  ?  ? ?Scheduled Meds: ? aspirin  81 mg Oral Daily  ? DULoxetine  60 mg Oral Daily  ? enoxaparin (LOVENOX) injection  40 mg Subcutaneous Q24H  ? feeding supplement  237 mL Oral BID BM  ? levothyroxine  150 mcg Oral QAC breakfast  ? simvastatin  20 mg Oral QHS  ? tamsulosin  0.4 mg Oral BID  ? vitamin B-12  1,000 mcg Oral Daily  ? ?Continuous Infusions: ? ceFEPime (MAXIPIME) IV 1 g (07/19/21 1345)  ? ? ?Procedures/Studies: ?DG Chest 2 View ? ?Result Date: 07/14/2021 ?CLINICAL DATA:  Hypotension.  Recent back surgery. EXAM: CHEST - 2 VIEW COMPARISON:  01/31/2019 chest radiograph FINDINGS: The cardiomediastinal silhouette is unremarkable. There is no  evidence of focal airspace disease, pulmonary edema, suspicious pulmonary nodule/mass, pleural effusion, or pneumothorax. No acute bony abnormalities are identified. Remote bilateral rib fractures again not

## 2021-07-19 NOTE — Progress Notes (Signed)
Pt expresses concern of not having anywhere to go, states that the reason he has not done therapy or ambulated here at the hospital because he is not here for therapy he was here for an infection. Pt states he is supposed to start therapy at [facility] tomorrow. I educated pt on the importance of getting up and sitting in the chair atleast or ambulating, states it is the facility where he was at their fault for dropping him 3 times after surgery that has limited his mobility. I offered pt to get in the chair and he is upset at this time and confused about discharge disposition.  ?

## 2021-07-19 NOTE — Progress Notes (Signed)
Pt has ambulated with assistance and walker from bed to chair.  ?

## 2021-07-19 NOTE — TOC Initial Note (Addendum)
Transition of Care (TOC) - Initial/Assessment Note  ? ? ?Patient Details  ?Name: [name] ?MRN: [id number] ?Date of Birth: February 22, 1946 ? ?Transition of Care (TOC) CM/SW Contact:    ?Eydie Wormley D, LCSW ?Phone Number: ?07/19/2021, 12:27 PM ? ?Clinical Narrative:                 ?Patient from [facility] where he is in a room with his wife. Per Olegario Messier with [facility] patient has not been doing self mobilizing since his back surgery despite the surgeon saying that he is capable of ambulating. She states he is soiling himself rather than going to the bathroom. She states that she is unsure as to whether the facility will take him back due to his refusal to do anything for himself. She stated that based on how he is functioning at the facility, he is not ALF level of care. Olegario Messier to call TOC back once she is in the building to discuss further.  ?Discussed with patient issues reported by ALF. Patient denied this. Discussed that the issues reported by the ALF are the same issues that are happening currently during this hospitalization. Patient states that I will need to talk to his wife about this issue.  ?Message left for wife requesting return contact.  ? ?Expected Discharge Plan: Assisted Living ?Barriers to Discharge: Continued Medical Work up ? ? ?Patient Goals and CMS Choice ?Patient states their goals for this hospitalization and ongoing recovery are:: patient wants to return to ALF ?  ?  ? ?Expected Discharge Plan and Services ?Expected Discharge Plan: Assisted Living ?  ?  ?  ?Living arrangements for the past 2 months: Assisted Living Facility ?                ?  ?  ?  ?  ?  ?  ?  ?  ?  ?  ? ?Prior Living Arrangements/Services ?Living arrangements for the past 2 months: Assisted Living Facility ?Lives with:: Facility Resident, Spouse ?Patient language and need for interpreter reviewed:: Yes ?       ?Need for Family Participation in Patient Care: Yes (Comment) ?Care giver support system in place?: Yes  (comment) ?  ?Criminal Activity/Legal Involvement Pertinent to Current Situation/Hospitalization: No - Comment as needed ? ?Activities of Daily Living ?Home Assistive Devices/Equipment: Dan Humphreys (specify type), Cane (specify quad or straight) ?ADL Screening (condition at time of admission) ?Patient's cognitive ability adequate to safely complete daily activities?: Yes ?Is the patient deaf or have difficulty hearing?: No ?Does the patient have difficulty seeing, even when wearing glasses/contacts?: No ?Does the patient have difficulty concentrating, remembering, or making decisions?: No ?Patient able to express need for assistance with ADLs?: Yes ?Does the patient have difficulty dressing or bathing?: Yes ?Independently performs ADLs?: No ?Does the patient have difficulty walking or climbing stairs?: Yes ?Weakness of Legs: Both ?Weakness of Arms/Hands: None ? ?Permission Sought/Granted ?Permission sought to share information with : Magazine features editor ?  ? Share Information with NAME: Cammie Sickle, with [facility] ?   ?   ?   ? ?Emotional Assessment ?  ?  ?Affect (typically observed): Appropriate ?Orientation: : Oriented to Self, Oriented to Place, Oriented to  Time, Oriented to Situation ?Alcohol / Substance Use: Not Applicable ?Psych Involvement: No (comment) ? ?Admission diagnosis:  Pseudomonas infection [A49.8] ?Acute cystitis without hematuria [N30.00] ?Patient Active Problem List  ? Diagnosis Date Noted  ? Hypothyroidism 07/18/2021  ? Pseudomonas UTI infection 07/17/2021  ? Status post  laminectomy 06/29/2021  ? Lumbar stenosis with neurogenic claudication 06/29/2021  ? Incomplete bladder emptying 05/09/2021  ? Myelopathy concurrent with and due to spinal stenosis of thoracic region [facility]) 01/08/2021  ? Acute cystitis without hematuria 08/18/2020  ? Nocturia 08/18/2020  ? Kidney stones 07/04/2020  ? Benign prostatic hyperplasia with urinary obstruction 07/04/2020  ? Chest pain, rule out acute myocardial  infarction 01/31/2019  ? COPD (chronic obstructive pulmonary disease) (HCC) 01/31/2019  ? Acute respiratory failure with hypoxia (HCC) 01/31/2019  ? HTN (hypertension) 01/31/2019  ? ?PCP:  Galvin Proffer, MD ?Pharmacy:   ?[facility] - [place], [place] - [phone] [address] ?[address] ?[address] ?[place] [address] ?Phone: [phone] Fax: [phone] ? ? ? ? ?Social Determinants of Health (SDOH) Interventions ?  ? ?Readmission Risk Interventions ?   ? View : No data to display.  ?  ?  ?  ? ? ? ?

## 2021-07-20 LAB — CBC
HCT: 27.3 % — ABNORMAL LOW (ref 39.0–52.0)
Hemoglobin: 8.6 g/dL — ABNORMAL LOW (ref 13.0–17.0)
MCH: 30.2 pg (ref 26.0–34.0)
MCHC: 31.5 g/dL (ref 30.0–36.0)
MCV: 95.8 fL (ref 80.0–100.0)
Platelets: 192 10*3/uL (ref 150–400)
RBC: 2.85 MIL/uL — ABNORMAL LOW (ref 4.22–5.81)
RDW: 13.3 % (ref 11.5–15.5)
WBC: 6 10*3/uL (ref 4.0–10.5)
nRBC: 0 % (ref 0.0–0.2)

## 2021-07-20 LAB — BASIC METABOLIC PANEL
Anion gap: 6 (ref 5–15)
BUN: 19 mg/dL (ref 8–23)
CO2: 26 mmol/L (ref 22–32)
Calcium: 9 mg/dL (ref 8.9–10.3)
Chloride: 106 mmol/L (ref 98–111)
Creatinine, Ser: 1.06 mg/dL (ref 0.61–1.24)
GFR, Estimated: >60 mL/min (ref >60)
Glucose, Bld: 91 mg/dL (ref 70–99)
Potassium: 4.3 mmol/L (ref 3.5–5.1)
Sodium: 138 mmol/L (ref 135–145)

## 2021-07-20 LAB — MAGNESIUM: Magnesium: 1.9 mg/dL (ref 1.7–2.4)

## 2021-07-20 MED ORDER — CEFEPIME HCL 1 G IJ SOLR
1.0000 g | Freq: Three times a day (TID) | INTRAMUSCULAR | Status: DC
  Administered 2021-07-20 – 2021-07-21 (×3): 1 g via INTRAVENOUS
  Filled 2021-07-20 (×2): qty 10
  Filled 2021-07-20: qty 1
  Filled 2021-07-20 (×3): qty 10
  Filled 2021-07-20: qty 1

## 2021-07-20 NOTE — Progress Notes (Signed)
?  ?       ?PROGRESS NOTE ? ?[name] [id number] DOB: 1946-02-16 DOA: 07/17/2021 ?PCP: Bonnita Nasuti, MD ? ?Brief History:  ?76 year old gentleman with history of multiple prior spinal surgeries recently status post having a L3-5 decompression and fusion surgery with Dr. Deri Fuelling on 06/29/2021.  He did well postoperatively and was discharged on 07/01/2021 to his ALF.  He received home health PT services.  He has a history of recurrent UTIs and BPH and followed by Dr. Alyson Ingles in [place].  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  He also has a history of incomplete bladder emptying and it is managed with Flomax twice daily.  He presented to the emergency department on 07/14/2021 from ALF with complaints of low blood pressures.  He was given IV fluids in the emergency department and his hypotension resolved.  His work-up was significant for a urinary tract infection and he was discharged home.  His urine culture came back positive for multidrug-resistant Pseudomonas infection and he was advised to return to the emergency department given that his ALF is not able to administer IV antibiotic therapy.  I was told by ED provider that ID pharmacy was consulted with this morning and recommended that he complete a course of cefepime IV therapy.  ? ? ?Assessment and Plan: ?* Pseudomonas UTI infection ?-- Follow-up blood cultures x2 ?-- IV cefepime--continue x 5 days total ?Last day is 4/8 ? ?Hypothyroidism ?Continue synthroid ? ?Lumbar stenosis with neurogenic claudication ?-underwent decompression at L1-2 and L2-3 with revision of fusion by Dr. Annette Stable on 06/29/2021.  ?-Removal of old L3 pedicle screws bilaterally.  Posterior lateral arthrodesis utilizing segmental pedicle screw fixation and local autografting from L1-L3 ?-PT eval>>SNF ? ?Incomplete bladder emptying ?--pt is followed by Dr. Alyson Ingles and remains on Flomax twice  daily ? ?HTN (hypertension) ?-- holding benazepril due to soft BPs ?-judicious IVF ? ?COPD (chronic obstructive pulmonary disease) ([place]) ?-- resume home bronchodilators ?--stable on RA ? ? ?Status is: Inpatient ?Remains inpatient appropriate because: severity of illness requiring IVF and IV antibiotics ?  ?  ?  ?Family Communication:   no Family at bedside ?  ?Consultants:  none ?  ?Code Status:  FULL  ?  ?DVT Prophylaxis:   [place]schbach Lovenox ?  ?  ?Procedures: ?As Listed in Progress Note Above ?  ?Antibiotics: ?Cefepime 4/4>> ?  ?  ?  ? ? ? ?Subjective: ? ?Patient denies fevers, chills, headache, chest pain, dyspnea, nausea, vomiting, diarrhea, abdominal pain, dysuria, hematuria, hematochezia, and melena. ? ?Objective: ?Vitals:  ? 07/19/21 1343 07/19/21 2036 07/20/21 0507 07/20/21 1242  ?BP: 116/79 105/63 107/66 103/69  ?Pulse: 97 77 84 96  ?Resp: 18 18 18 18   ?Temp: (!) 97.5 ?F (36.4 ?C) 98.1 ?F (36.7 ?C) 98.6 ?F (37 ?C) 97.7 ?F (36.5 ?C)  ?TempSrc: Oral   Oral  ?SpO2: 100% 100% 98% 100%  ?Weight:      ?Height:      ? ? ?Intake/Output Summary (Last 24 hours) at 07/20/2021 1743 ?Last data filed at 07/20/2021 1500 ?Gross per 24 hour  ?Intake 1298.73 ml  ?Output 1750 ml  ?Net -451.27 ml  ? ?Weight change:  ?Exam: ? ?General:  Pt is alert, follows commands appropriately, not in acute distress ?HEENT: No icterus, No thrush, No neck mass, [place]/AT ?Cardiovascular: RRR, S1/S2, no rubs, no gallops ?Respiratory: CTA bilaterally, no wheezing, no crackles, no rhonchi ?  Abdomen: Soft/+BS, non tender, non distended, no guarding ?Extremities: No edema, No lymphangitis, No petechiae, No rashes, no synovitis ? ? ?Data Reviewed: ?I have personally reviewed following labs and imaging studies ?Basic Metabolic Panel: ?Recent Labs  ?Lab 07/14/21 ?1421 07/17/21 ?1113 07/20/21 ?0439  ?NA 140 138 138  ?K 4.7 4.7 4.3  ?CL 106 106 106  ?CO2 26 23 26   ?GLUCOSE 129* 117* 91  ?BUN 28* 21 19  ?CREATININE 1.44* 1.15  1.18 1.06  ?CALCIUM 9.0 9.0 9.0  ?MG  --    --  1.9  ? ?Liver Function Tests: ?Recent Labs  ?Lab 07/14/21 ?1421 07/17/21 ?1113  ?AST 12* 13*  ?ALT 9 9  ?ALKPHOS 137* 154*  ?BILITOT 0.4 0.4  ?PROT 6.8 7.0  ?ALBUMIN 3.6 3.8  ? ?No results for input(s): LIPASE, AMYLASE in the last 168 hours. ?No results for input(s): AMMONIA in the last 168 hours. ?Coagulation Profile: ?No results for input(s): INR, PROTIME in the last 168 hours. ?CBC: ?Recent Labs  ?Lab 07/14/21 ?1421 07/17/21 ?1113 07/20/21 ?0439  ?WBC 7.9 9.2  9.0 6.0  ?NEUTROABS 6.2 6.9  --   ?HGB 9.3* 9.7*  9.7* 8.6*  ?HCT 29.4* 31.0*  30.0* 27.3*  ?MCV 96.4 99.0  96.5 95.8  ?PLT 239 246  233 192  ? ?Cardiac Enzymes: ?No results for input(s): CKTOTAL, CKMB, CKMBINDEX, TROPONINI in the last 168 hours. ?BNP: ?Invalid input(s): POCBNP ?CBG: ?No results for input(s): GLUCAP in the last 168 hours. ?HbA1C: ?No results for input(s): HGBA1C in the last 72 hours. ?Urine analysis: ?   ?Component Value Date/Time  ? COLORURINE YELLOW 07/14/2021 1645  ? APPEARANCEUR CLEAR 07/14/2021 1645  ? APPEARANCEUR Hazy (A) 06/11/2021 1122  ? LABSPEC 1.015 07/14/2021 1645  ? PHURINE 7.0 07/14/2021 1645  ? GLUCOSEU NEGATIVE 07/14/2021 1645  ? HGBUR SMALL (A) 07/14/2021 1645  ? BILIRUBINUR NEGATIVE 07/14/2021 1645  ? BILIRUBINUR Negative 06/11/2021 1122  ? KETONESUR NEGATIVE 07/14/2021 1645  ? PROTEINUR NEGATIVE 07/14/2021 1645  ? NITRITE POSITIVE (A) 07/14/2021 1645  ? LEUKOCYTESUR MODERATE (A) 07/14/2021 1645  ? ?Sepsis Labs: ?@LABRCNTIP (procalcitonin:4,lacticidven:4) ?) ?Recent Results (from the past 240 hour(s))  ?Culture, blood (routine x 2)     Status: None  ? Collection Time: 07/14/21  3:30 PM  ? Specimen: Left Antecubital; Blood  ?Result Value Ref Range Status  ? Specimen Description   Final  ?  LEFT ANTECUBITAL BOTTLES DRAWN AEROBIC AND ANAEROBIC  ? Special Requests   Final  ?  Blood Culture results may not be optimal due to an excessive volume of blood received in culture bottles  ? Culture   Final  ?  NO GROWTH 5  DAYS ?Performed at [facility], [address] [place], [place] [address] ?  ? Report Status 07/19/2021 FINAL  Final  ?Culture, blood (routine x 2)     Status: None  ? Collection Time: 07/14/21  3:30 PM  ? Specimen: BLOOD LEFT HAND  ?Result Value Ref Range Status  ? Specimen Description   Final  ?  BLOOD LEFT HAND BOTTLES DRAWN AEROBIC AND ANAEROBIC  ? Special Requests Blood Culture adequate volume  Final  ? Culture   Final  ?  NO GROWTH 5 DAYS ?Performed at [facility], [address], [place], [place] [address] ?  ? Report Status 07/19/2021 FINAL  Final  ?Urine Culture     Status: Abnormal  ? Collection Time: 07/14/21  4:45 PM  ? Specimen: Urine, Clean Catch  ?Result Value Ref  Range Status  ? Specimen Description   Final  ?  URINE, CLEAN CATCH ?Performed at [facility], [address], [place], [place] [address] ?  ? Special Requests   Final  ?  NONE ?Performed at [facility], [address], [place], [place] [address] ?  ? Culture >=100,000 COLONIES/mL PSEUDOMONAS AERUGINOSA (A)  Final  ? Report Status 07/16/2021 FINAL  Final  ? Organism ID, Bacteria PSEUDOMONAS AERUGINOSA (A)  Final  ?    Susceptibility  ? Pseudomonas aeruginosa - MIC*  ?  CEFTAZIDIME <=1 SENSITIVE Sensitive   ?  CIPROFLOXACIN 2 RESISTANT Resistant   ?  GENTAMICIN <=1 SENSITIVE Sensitive   ?  IMIPENEM >=16 RESISTANT Resistant   ?  PIP/TAZO <=4 SENSITIVE Sensitive   ?  CEFEPIME 0.5 SENSITIVE Sensitive   ?  * >=100,000 COLONIES/mL PSEUDOMONAS AERUGINOSA  ?  ? ?Scheduled Meds: ? aspirin  81 mg Oral Daily  ? DULoxetine  60 mg Oral Daily  ? enoxaparin (LOVENOX) injection  40 mg Subcutaneous Q24H  ? feeding supplement  237 mL Oral BID BM  ? levothyroxine  150 mcg Oral QAC breakfast  ? simvastatin  20 mg Oral QHS  ? tamsulosin  0.4 mg Oral BID  ? vitamin B-12  1,000 mcg Oral Daily  ? ?Continuous Infusions: ? ceFEPime (MAXIPIME) IV 1 g (07/20/21 1430)  ? ? ?Procedures/Studies: ?DG Chest 2 View ? ?Result Date: 07/14/2021 ?CLINICAL DATA:  Hypotension.   Recent back surgery. EXAM: CHEST - 2 VIEW COMPARISON:  01/31/2019 chest radiograph FINDINGS: The cardiomediastinal silhouette is unremarkable. There is no evidence of focal airspace disease, pulmonary edema, suspicious pu

## 2021-07-20 NOTE — Evaluation (Signed)
Physical Therapy Evaluation ?Patient Details ?Name: [name] ?MRN: [id number] ?DOB: 1946/03/12 ?Today's Date: 07/20/2021 ? ?History of Present Illness ? [name] is a 76 year old gentleman with history of multiple prior spinal surgeries recently status post having a L3-5 decompression and fusion surgery with Dr. Lelon Perla on 06/29/2021.  He did well postoperatively and was discharged on 07/01/2021 to his ALF.  He received home health PT services.  He has a history of recurrent UTIs and BPH and followed by Dr. Ronne Binning in [place].  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  He also has a history of incomplete bladder emptying and it is managed with Flomax twice daily.  He presented to the emergency department on 07/14/2021 from ALF with complaints of low blood pressures.  He was given IV fluids in the emergency department and his hypotension resolved.  His work-up was significant for a urinary tract infection and he was discharged home.  His urine culture came back positive for multidrug-resistant Pseudomonas infection and he was advised to return to the emergency department given that his ALF is not able to administer IV antibiotic therapy.  I was told by ED provider that ID pharmacy was consulted with this morning and recommended that he complete a course of cefepime IV therapy. ?  ?Clinical Impression ? Patient limited for functional mobility as stated below secondary to BLE weakness, fatigue and poor standing balance. Patient requires mod assist to pull to seated EOB due to weakness. He demonstrates fair sitting balance and good sitting tolerance EOB. Patient requires bed elevated, RW, and assist to transfer to standing. He demonstrates poor/fair balance with RW. He ambulates with unsteady cadence with RW and is limited by fatigue. Patient ends session seated in chair. Patient will benefit from continued physical therapy  in hospital and recommended venue below to increase strength, balance, endurance for safe ADLs and gait. ?   ?   ? ?Recommendations for follow up therapy are one component of a multi-disciplinary discharge planning process, led by the attending physician.  Recommendations may be updated based on patient status, additional functional criteria and insurance authorization. ? ?Follow Up Recommendations Skilled nursing-short term rehab (<3 hours/day) ? ?  ?Assistance Recommended at Discharge Intermittent Supervision/Assistance  ?Patient can return home with the following ? A lot of help with walking and/or transfers;A lot of help with bathing/dressing/bathroom;Assistance with cooking/housework;Assist for transportation ? ?  ?Equipment Recommendations None recommended by PT  ?Recommendations for Other Services ?    ?  ?Functional Status Assessment Patient has had a recent decline in their functional status and demonstrates the ability to make significant improvements in function in a reasonable and predictable amount of time.  ? ?  ?Precautions / Restrictions Precautions ?Precautions: Fall;Back ?Restrictions ?Weight Bearing Restrictions: No  ? ?  ? ?Mobility ? Bed Mobility ?Overal bed mobility: Needs Assistance ?Bed Mobility: Supine to Sit, Sidelying to Sit ?Rolling: Min guard ?Sidelying to sit: Mod assist ?Supine to sit: Mod assist ?  ?  ?General bed mobility comments: slow, labored, assist to pull to seated ?  ? ?Transfers ?Overall transfer level: Needs assistance ?Equipment used: Rolling walker (2 wheels) ?Transfers: Sit to/from Stand ?Sit to Stand: Mod assist, From elevated surface ?  ?Step pivot transfers: Mod assist ?  ?  ?  ?General transfer comment: Mod A for STS for power up and balance, requires cueing ?  ? ?Ambulation/Gait ?Ambulation/Gait assistance: Min assist ?  Gait Distance (Feet): 40 Feet ?Assistive device: Rolling walker (2 wheels) ?Gait Pattern/deviations: Step-through pattern, Decreased step length -  right, Decreased step length - left, Decreased stride length, Trunk flexed, Narrow base of support ?Gait velocity: decreased ?  ?  ?General Gait Details: slow, unsteady, limited by fatigue ? ?Stairs ?  ?  ?  ?  ?  ? ?Wheelchair Mobility ?  ? ?Modified Rankin (Stroke Patients Only) ?  ? ?  ? ?Balance Overall balance assessment: Needs assistance ?Sitting-balance support: Feet supported, No upper extremity supported ?Sitting balance-Leahy Scale: Fair ?Sitting balance - Comments: seated EOB ?  ?Standing balance support: Bilateral upper extremity supported, Reliant on assistive device for balance ?Standing balance-Leahy Scale: Poor ?Standing balance comment: requires RW ?  ?  ?  ?  ?  ?  ?  ?  ?  ?  ?  ?   ? ? ? ?Pertinent Vitals/Pain Pain Assessment ?Pain Assessment: No/denies pain  ? ? ?Home Living Family/patient expects to be discharged to:: Assisted living ?  ?  ?  ?  ?  ?  ?  ?  ?Home Equipment: Agricultural consultantolling Walker (2 wheels);Rollator (4 wheels);Shower seat ?Additional Comments: Pt lives in ALF with wife.  ?  ?Prior Function   ?  ?  ?  ?  ?  ?  ?Mobility Comments: Pt uses w/c or rollator in facility ?ADLs Comments: spouse assists as needed with IADL ?  ? ? ?Hand Dominance  ? Dominant Hand: Right ? ?  ?Extremity/Trunk Assessment  ? Upper Extremity Assessment ?Upper Extremity Assessment: Generalized weakness ?  ? ?Lower Extremity Assessment ?Lower Extremity Assessment: Generalized weakness ?  ? ?Cervical / Trunk Assessment ?Cervical / Trunk Assessment: Kyphotic;Back Surgery  ?Communication  ? Communication: No difficulties  ?Cognition Arousal/Alertness: Awake/alert ?Behavior During Therapy: [facility] for tasks assessed/performed ?Overall Cognitive Status: Within Functional Limits for tasks assessed ?  ?  ?  ?  ?  ?  ?  ?  ?  ?  ?  ?  ?  ?  ?  ?  ?  ?  ?  ? ?  ?General Comments   ? ?  ?Exercises    ? ?Assessment/Plan  ?  ?PT Assessment Patient needs continued PT services  ?PT Problem List Decreased strength;Decreased  balance;Decreased mobility;Decreased safety awareness;Decreased activity tolerance;Decreased knowledge of precautions ? ?   ?  ?PT Treatment Interventions DME instruction;Functional mobility training;Balance training;Patient/family education;Gait training;Therapeutic exercise;Stair training;Therapeutic activities;Neuromuscular re-education   ? ?PT Goals (Current goals can be found in the Care Plan section)  ?Acute Rehab PT Goals ?Patient Stated Goal: return home ?PT Goal Formulation: With patient ?Time For Goal Achievement: 08/03/21 ?Potential to Achieve Goals: Good ? ?  ?Frequency Min 3X/week ?  ? ? ?Co-evaluation   ?  ?  ?  ?  ? ? ?  ?AM-PAC PT "6 Clicks" Mobility  ?Outcome Measure Help needed turning from your back to your side while in a flat bed without using bedrails?: A Little ?Help needed moving from lying on your back to sitting on the side of a flat bed without using bedrails?: A Lot ?Help needed moving to and from a bed to a chair (including a wheelchair)?: A Lot ?Help needed standing up from a chair using your arms (e.g., wheelchair or bedside chair)?: A Lot ?Help needed to walk in hospital room?: A Little ?Help needed climbing 3-5 steps with a railing? : Total ?6 Click Score: 13 ? ?  ?End of Session Equipment Utilized  During Treatment: Gait belt ?Activity Tolerance: Patient limited by fatigue ?Patient left: in chair;with call bell/phone within reach ?Nurse Communication: Mobility status ?PT Visit Diagnosis: Other abnormalities of gait and mobility (R26.89);Difficulty in walking, not elsewhere classified (R26.2);Unsteadiness on feet (R26.81);Muscle weakness (generalized) (M62.81) ?  ? ?Time: 1610-9604 ?PT Time Calculation (min) (ACUTE ONLY): 29 min ? ? ?Charges:   PT Evaluation ?$PT Eval Moderate Complexity: 1 Mod ?PT Treatments ?$Therapeutic Activity: 23-37 mins ?  ?   ? ? ?12:03 PM, 07/20/21 ?Wyman Songster PT, DPT ?Physical Therapist at [facility] ?[facility] ? ? ?

## 2021-07-20 NOTE — TOC Progression Note (Signed)
Transition of Care (TOC) - Progression Note  ? ? ?Patient Details  ?Name: [name] ?MRN: [id number] ?Date of Birth: [birth year] ? ?Transition of Care (TOC) CM/SW Contact  ?Hanzel Pizzo D, LCSW ?Phone Number: ?07/20/2021, 2:22 PM ? ?Clinical Narrative:    ?Patient chooses bed at [facility]. Kayla with [facility] notified of bed acceptance and possible d/c for Saturday pending authorization. Wife made aware of d/c. ? ? ?Expected Discharge Plan: Assisted Living ?Barriers to Discharge: Continued Medical Work up ? ?Expected Discharge Plan and Services ?Expected Discharge Plan: Assisted Living ?  ?  ?  ?Living arrangements for the past 2 months: Assisted Living Facility ?                ?  ?  ?  ?  ?  ?  ?  ?  ?  ?  ? ? ?Social Determinants of Health (SDOH) Interventions ?  ? ?Readmission Risk Interventions ?   ? View : No data to display.  ?  ?  ?  ? ? ?

## 2021-07-20 NOTE — [place] FL2 (Signed)
?[place] MEDICAID FL2 LEVEL OF CARE SCREENING TOOL  ?  ? ?IDENTIFICATION  ?Patient Name: ?[name] Birthdate: 1945-05-29 Sex: male Admission Date (Current Location): ?07/17/2021  ?[place] Number: ? [place] ?  Facility and Address:  ?[facility],  [address] [place] [address] ?     Provider Number: ?[id number]  ?Attending Physician Name and Address:  ?Catarina Hartshorn, MD ? Relative Name and Phone Number:  ?Doristine Devoid [facility]) (Daughter)   [phone] ?   ?Current Level of Care: ?Hospital Recommended Level of Care: ?Skilled Nursing Facility Prior Approval Number: ?  ? ?Date Approved/Denied: ?  PASRR Number: ?[id number] A ? ?Discharge Plan: ?SNF ?  ? ?Current Diagnoses: ?Patient Active Problem List  ? Diagnosis Date Noted  ? Hypothyroidism 07/18/2021  ? Pseudomonas UTI infection 07/17/2021  ? Status post laminectomy 06/29/2021  ? Lumbar stenosis with neurogenic claudication 06/29/2021  ? Incomplete bladder emptying 05/09/2021  ? Myelopathy concurrent with and due to spinal stenosis of thoracic region [facility]) 01/08/2021  ? Acute cystitis without hematuria 08/18/2020  ? Nocturia 08/18/2020  ? Kidney stones 07/04/2020  ? Benign prostatic hyperplasia with urinary obstruction 07/04/2020  ? Chest pain, rule out acute myocardial infarction 01/31/2019  ? COPD (chronic obstructive pulmonary disease) (HCC) 01/31/2019  ? Acute respiratory failure with hypoxia (HCC) 01/31/2019  ? HTN (hypertension) 01/31/2019  ? ? ?Orientation RESPIRATION BLADDER Height & Weight   ?  ?Self, Time, Situation, Place ? Normal Continent, Incontinent Weight: 238 lb (108 kg) ?Height:  6\' 7"  (200.7 cm)  ?BEHAVIORAL SYMPTOMS/MOOD NEUROLOGICAL BOWEL NUTRITION STATUS  ?    Continent, Incontinent Diet (DYS 3)  ?AMBULATORY STATUS COMMUNICATION OF NEEDS Skin   ?Limited Assist Verbally Surgical wounds (closed incision back) ?  ?  ?  ?    ?     ?     ? ? ?Personal Care Assistance Level of Assistance  ?Bathing, Feeding,  Dressing Bathing Assistance: Limited assistance ?Feeding assistance: Independent ?Dressing Assistance: Limited assistance ?Total Care Assistance:  (N/a)  ? ?Functional Limitations Info  ?Sight, Hearing, Speech Sight Info: Adequate ?Hearing Info: Adequate ?Speech Info: Adequate  ? ? ?SPECIAL CARE FACTORS FREQUENCY  ?PT (By licensed PT)   ?  ?  ?OT Frequency: 5x/week ?  ?  ?  ?   ? ? ?Contractures Contractures Info: Not present  ? ? ?Additional Factors Info  ?Code Status, Allergies Code Status Info: Full Code ?Allergies Info: Sulfa antibiotics ?  ?  ?  ?   ? ?Current Medications (07/20/2021):  This is the current hospital active medication list ?Current Facility-Administered Medications  ?Medication Dose Route Frequency Provider Last Rate Last Admin  ? acetaminophen (TYLENOL) tablet 650 mg  650 mg Oral Q6H PRN Johnson, Clanford L, MD      ? Or  ? acetaminophen (TYLENOL) suppository 650 mg  650 mg Rectal Q6H PRN Johnson, Clanford L, MD      ? aspirin chewable tablet 81 mg  81 mg Oral Daily Johnson, Clanford L, MD   81 mg at 07/20/21 0845  ? bisacodyl (DULCOLAX) EC tablet 5 mg  5 mg Oral Daily PRN Johnson, Clanford L, MD      ? ceFEPIme (MAXIPIME) 1 g in sodium chloride 0.9 % 100 mL IVPB  1 g Intravenous Q8H Tat, David, MD      ? diazepam (VALIUM) tablet 5 mg  5 mg Oral Q6H PRN Johnson, Clanford L, MD      ? DULoxetine (CYMBALTA) DR capsule 60  mg  60 mg Oral Daily Johnson, Clanford L, MD   60 mg at 07/20/21 0845  ? enoxaparin (LOVENOX) injection 40 mg  40 mg Subcutaneous Q24H Johnson, Clanford L, MD   40 mg at 07/19/21 1726  ? feeding supplement (ENSURE ENLIVE / ENSURE PLUS) liquid 237 mL  237 mL Oral BID BM Johnson, Clanford L, MD   237 mL at 07/20/21 0846  ? fentaNYL (SUBLIMAZE) injection 12.5 mcg  12.5 mcg Intravenous Q2H PRN Johnson, Clanford L, MD      ? HYDROcodone-acetaminophen (NORCO) 10-325 MG per tablet 1 tablet  1 tablet Oral Q4H PRN Johnson, Clanford L, MD      ? levothyroxine (SYNTHROID) tablet 150 mcg  150  mcg Oral QAC breakfast Laural Benes, Clanford L, MD   150 mcg at 07/20/21 0523  ? loperamide (IMODIUM) capsule 2 mg  2 mg Oral PRN Laural Benes, Clanford L, MD   2 mg at 07/18/21 1148  ? ondansetron (ZOFRAN) tablet 4 mg  4 mg Oral Q6H PRN Johnson, Clanford L, MD      ? Or  ? ondansetron (ZOFRAN) injection 4 mg  4 mg Intravenous Q6H PRN Johnson, Clanford L, MD      ? simvastatin (ZOCOR) tablet 20 mg  20 mg Oral QHS Johnson, Clanford L, MD   20 mg at 07/19/21 2112  ? tamsulosin (FLOMAX) capsule 0.4 mg  0.4 mg Oral BID Laural Benes, Clanford L, MD   0.4 mg at 07/20/21 0845  ? traZODone (DESYREL) tablet 25 mg  25 mg Oral QHS PRN Johnson, Clanford L, MD      ? vitamin B-12 (CYANOCOBALAMIN) tablet 1,000 mcg  1,000 mcg Oral Daily Laural Benes, Clanford L, MD   1,000 mcg at 07/20/21 0845  ? ? ? ?Discharge Medications: ?Please see discharge summary for a list of discharge medications. ? ?Relevant Imaging Results: ? ?Relevant Lab Results: ? ? ?Additional Information ?SSN [id number] ? ?Tyree Fluharty D, LCSW ? ? ? ? ?

## 2021-07-20 NOTE — Care Management Important Message (Signed)
Important Message ? ?Patient Details  ?Name: [name] ?MRN: [id number] ?Date of Birth: 02-Oct-1945 ? ? ?Medicare Important Message Given:  Yes ? ?Reviewed Medicare IM with patient via room phone [phone]).  Copy of Medicare IM sent to home address on file.  ? ? ?Johnell Comings ?07/20/2021, 2:06 PM ?

## 2021-07-20 NOTE — TOC Progression Note (Signed)
Transition of Care (TOC) - Progression Note  ? ? ?Patient Details  ?Name: LUKA STOHR ?MRN: [id number] ?Date of Birth: [birth year] ? ?Transition of Care (TOC) CM/SW Contact  ?Moody Robben D, LCSW ?Phone Number: ?07/20/2021, 1:21 PM ? ?Clinical Narrative:    ?Olegario Messier at [facility] spoke with corporate and they advised that they will not accept patient back as he needs a higher level of care. PT evaluation recommended SNF. Patient wants to stay in [facility]. Referred to SNFs in [facility]. Auth started.  ? ? ?Expected Discharge Plan: Assisted Living ?Barriers to Discharge: Continued Medical Work up ? ?Expected Discharge Plan and Services ?Expected Discharge Plan: Assisted Living ?  ?  ?  ?Living arrangements for the past 2 months: Assisted Living Facility ?                ?  ?  ?  ?  ?  ?  ?  ?  ?  ?  ? ? ?Social Determinants of Health (SDOH) Interventions ?  ? ?Readmission Risk Interventions ?   ? View : No data to display.  ?  ?  ?  ? ? ?

## 2021-07-20 NOTE — Plan of Care (Signed)
?  Problem: Acute Rehab PT Goals(only PT should resolve) ?Goal: Pt Will Go Supine/Side To Sit ?Outcome: Progressing ?Flowsheets (Taken 07/20/2021 1205) ?Pt will go Supine/Side to Sit: with min guard assist ?Goal: Patient Will Transfer Sit To/From Stand ?Outcome: Progressing ?Flowsheets (Taken 07/20/2021 1205) ?Patient will transfer sit to/from stand: with min guard assist ?Goal: Pt Will Transfer Bed To Chair/Chair To Bed ?Outcome: Progressing ?Flowsheets (Taken 07/20/2021 1205) ?Pt will Transfer Bed to Chair/Chair to Bed: min guard assist ?Goal: Pt Will Ambulate ?Outcome: Progressing ?Flowsheets (Taken 07/20/2021 1205) ?Pt will Ambulate: ? 75 feet ? with min guard assist ? with least restrictive assistive device ?Goal: Pt/caregiver will Perform Home Exercise Program ?Outcome: Progressing ?Flowsheets (Taken 07/20/2021 1205) ?Pt/caregiver will Perform Home Exercise Program: ? For increased strengthening ? For improved balance ? Independently ? 12:06 PM, 07/20/21 ?Mearl Latin PT, DPT ?Physical Therapist at [facility] ?[facility] ? ? ?

## 2021-07-21 NOTE — Plan of Care (Signed)

## 2021-07-21 NOTE — TOC Transition Note (Signed)
Transition of Care (TOC) - CM/SW Discharge Note ? ? ?Patient Details  ?Name: [name] ?MRN: [id number] ?Date of Birth: 02/25/1946 ? ?Transition of Care (TOC) CM/SW Contact:  ?Hetty Ely, RN ?Phone Number: ?07/21/2021, 1:45 PM ? ? ?Clinical Narrative: Patient to discharge to [facility] room 412-B via [facility]. Nurse to call report to Judeth Cornfield [phone]. ? ? ? ?Final next level of care: Skilled Nursing Facility ?Barriers to Discharge: Barriers Resolved ? ? ?Patient Goals and CMS Choice ?Patient states their goals for this hospitalization and ongoing recovery are:: patient wants to return to ALF ?  ?  ? ?Discharge Placement ?  ?           ?Patient chooses bed at: [facility] (Room 412-B) ?  ?  ?Patient and family notified of of transfer: 07/21/21 ? ?Discharge Plan and Services ?  ?  ?           ?  ?  ?  ?  ?  ?  ?  ?  ?  ?  ? ?Social Determinants of Health (SDOH) Interventions ?  ? ? ?Readmission Risk Interventions ?   ? View : No data to display.  ?  ?  ?  ? ? ? ? ? ?

## 2021-07-21 NOTE — Discharge Summary (Signed)
?Physician Discharge Summary ?  ?Patient: [name] MRN: [id number] DOB: 1945/10/26  ?Admit date:     07/17/2021  ?Discharge date: 07/21/21  ?Discharge Physician: Shanon Brow Yvana Samonte  ? ?PCP: Bonnita Nasuti, MD  ? ?Recommendations at discharge:  ?{ Please follow up with primary care provider within 1-2 weeks ? Please repeat BMP and CBC in one week ? ? ? ? ? ?Hospital Course: ?76 year old gentleman with history of multiple prior spinal surgeries recently status post having a L3-5 decompression and fusion surgery with Dr. Deri Fuelling on 06/29/2021.  He did well postoperatively and was discharged on 07/01/2021 to his ALF.  He received home health PT services.  He has a history of recurrent UTIs and BPH and followed by Dr. Alyson Ingles in [place].  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  He also has a history of incomplete bladder emptying and it is managed with Flomax twice daily.  He presented to the emergency department on 07/14/2021 from ALF with complaints of low blood pressures.  He was given IV fluids in the emergency department and his hypotension resolved.  His work-up was significant for a urinary tract infection and he was discharged home.  His urine culture came back positive for multidrug-resistant Pseudomonas infection and he was advised to return to the emergency department given that his ALF is not able to administer IV antibiotic therapy.  I was told by ED provider that ID pharmacy was consulted with this morning and recommended that he complete a course of cefepime IV therapy. Patient completed 5 day course of cefepime without any complications.  PT recommended SNF with which he agreed. ? ?Assessment and Plan: ?* Pseudomonas UTI infection ?-- Follow-up blood cultures x2 ?-- IV cefepime--continue x 5 days total ?Last day is 4/8 ? ?Hypothyroidism ?Continue synthroid ? ?Lumbar stenosis with neurogenic claudication ?-underwent  decompression at L1-2 and L2-3 with revision of fusion by Dr. Annette Stable on 06/29/2021.  ?-Removal of old L3 pedicle screws bilaterally.  Posterior lateral arthrodesis utilizing segmental pedicle screw fixation and local autografting from L1-L3 ?-PT eval>>SNF ? ?Incomplete bladder emptying ?--pt is followed by Dr. Alyson Ingles and remains on Flomax twice daily ? ?HTN (hypertension) ?-- holding benazepril due to soft BPs ?-BP remained well controlled ?-will not restart benazepril ? ?COPD (chronic obstructive pulmonary disease) ([place]) ?-- resume home bronchodilators ?--stable on RA ? ? ? ? ?  ?Consultants: none ?Procedures performed: none  ?Disposition: Skilled nursing facility ?Diet recommendation:  ?Cardiac diet ?DISCHARGE MEDICATION: ?Allergies as of 07/21/2021   ? ?   Reactions  ? Sulfa Antibiotics Other (See Comments)  ? Causes blisters and skin to peel from inside mouth  ? ?  ? ?  ?Medication List  ?  ? ?STOP taking these medications   ? ?benazepril 20 MG tablet ?Commonly known as: LOTENSIN ?  ?diazepam 5 MG tablet ?Commonly known as: VALIUM ?  ?HYDROcodone-acetaminophen 10-325 MG tablet ?Commonly known as: NORCO ?  ? ?  ? ?TAKE these medications   ? ?aspirin 81 MG chewable tablet ?Chew 81 mg by mouth daily. ?  ?DULoxetine 60 MG capsule ?Commonly known as: CYMBALTA ?Take 60 mg by mouth daily. ?  ?levothyroxine 150 MCG tablet ?Commonly known as: SYNTHROID ?Take 150 mcg by mouth daily before breakfast. ?  ?loperamide 2 MG capsule ?Commonly known as: IMODIUM ?Take 2 mg by mouth as needed for diarrhea or loose stools. One capsule with each loose  stool - max of 5 doses in 24 hours. ?  ?simvastatin 20 MG tablet ?Commonly known as: ZOCOR ?Take 20 mg by mouth at bedtime. ?  ?tamsulosin 0.4 MG Caps capsule ?Commonly known as: FLOMAX ?Take 1 capsule (0.4 mg total) by mouth in the morning and at bedtime. ?  ?trimethoprim 100 MG tablet ?Commonly known as: TRIMPEX ?Take 1 tablet (100 mg total) by mouth at bedtime. ?  ?vitamin B-12 1000  MCG tablet ?Commonly known as: CYANOCOBALAMIN ?Take 1,000 mcg by mouth daily. ?  ?Vitamin D3 1.25 MG (50000 UT) Tabs ?Take 50,000 Units by mouth every Wednesday. ?  ?cholecalciferol 25 MCG (1000 UNIT) tablet ?Commonly known as: VITAMIN D3 ?Take 1,000 Units by mouth daily. ?  ? ?  ? ? Contact information for after-discharge care   ? ? Destination   ? ? [facility] .   ?Service: Skilled Nursing ?Contact information: ?[place] ?[phone] ? ?  ?  ? ?  ?  ? ?  ?  ? ?  ? ?Discharge Exam: ?Filed Weights  ? 07/17/21 1102  ?Weight: 108 kg  ? ?HEENT:  [place]/AT, No thrush, no icterus ?CV:  RRR, no rub, no S3, no S4 ?Lung:  bibasilar rales. No wheeze ?Abd:  soft/+BS, NT ?Ext:  No edema, no lymphangitis, no synovitis, no rash ? ? ?Condition at discharge: stable ? ?The results of significant diagnostics from this hospitalization (including imaging, microbiology, ancillary and laboratory) are listed below for reference.  ? ?Imaging Studies: ?DG Chest 2 View ? ?Result Date: 07/14/2021 ?CLINICAL DATA:  Hypotension.  Recent back surgery. EXAM: CHEST - 2 VIEW COMPARISON:  01/31/2019 chest radiograph FINDINGS: The cardiomediastinal silhouette is unremarkable. There is no evidence of focal airspace disease, pulmonary edema, suspicious pulmonary nodule/mass, pleural effusion, or pneumothorax. No acute bony abnormalities are identified. Remote bilateral rib fractures again noted. Surgical hardware within the lumbar spine visualized. IMPRESSION: No active cardiopulmonary disease. Electronically Signed   By: Margarette Canada M.D.   On: 07/14/2021 14:53  ? ?DG Lumbar Spine 2-3 Views ? ?Result Date: 06/29/2021 ?CLINICAL DATA:  Laminectomy EXAM: LUMBAR SPINE - 2-3 VIEW COMPARISON:  CT lumbar spine 06/08/2021 FINDINGS: Intraoperative images during lumbar fusion revision. The upper segments have no posterior fusion rod at this time. Partially visualized lower segment posterior fusion rod. IMPRESSION:  Intraoperative images during lumbar spine fusion revision. Electronically Signed   By: Maurine Simmering M.D.   On: 06/29/2021 14:54  ? ?DG C-Arm 1-60 Min-No Report ? ?Result Date: 06/29/2021 ?Fluoroscopy was utilized by the requesting physician.  No radiographic interpretation.   ? ?Microbiology: ?Results for orders placed or performed during the hospital encounter of 07/14/21  ?Culture, blood (routine x 2)     Status: None  ? Collection Time: 07/14/21  3:30 PM  ? Specimen: Left Antecubital; Blood  ?Result Value Ref Range Status  ? Specimen Description   Final  ?  LEFT ANTECUBITAL BOTTLES DRAWN AEROBIC AND ANAEROBIC  ? Special Requests   Final  ?  Blood Culture results may not be optimal due to an excessive volume of blood received in culture bottles  ? Culture   Final  ?  NO GROWTH 5 DAYS ?Performed at [facility], [address], [place], [place] [address] ?  ? Report Status 07/19/2021 FINAL  Final  ?Culture, blood (routine x 2)     Status: None  ? Collection Time: 07/14/21  3:30 PM  ? Specimen: BLOOD LEFT HAND  ?  Result Value Ref Range Status  ? Specimen Description   Final  ?  BLOOD LEFT HAND BOTTLES DRAWN AEROBIC AND ANAEROBIC  ? Special Requests Blood Culture adequate volume  Final  ? Culture   Final  ?  NO GROWTH 5 DAYS ?Performed at [facility], [address] [place], [place] [address] ?  ? Report Status 07/19/2021 FINAL  Final  ?Urine Culture     Status: Abnormal  ? Collection Time: 07/14/21  4:45 PM  ? Specimen: Urine, Clean Catch  ?Result Value Ref Range Status  ? Specimen Description   Final  ?  URINE, CLEAN CATCH ?Performed at [facility], [address] [place], [place] [address] ?  ? Special Requests   Final  ?  NONE ?Performed at [facility], [address], [place], [place] [address] ?  ? Culture >=100,000 COLONIES/mL PSEUDOMONAS AERUGINOSA (A)  Final  ? Report Status 07/16/2021 FINAL  Final  ? Organism ID, Bacteria PSEUDOMONAS AERUGINOSA (A)  Final  ?    Susceptibility  ? Pseudomonas aeruginosa - MIC*   ?  CEFTAZIDIME <=1 SENSITIVE Sensitive   ?  CIPROFLOXACIN 2 RESISTANT Resistant   ?  GENTAMICIN <=1 SENSITIVE Sensitive   ?  IMIPENEM >=16 RESISTANT Resistant   ?  PIP/TAZO <=4 SENSITIVE Sensitive   ?  CEFEPIME 0

## 2021-08-09 ENCOUNTER — Ambulatory Visit: Payer: 59 | Admitting: Family Medicine

## 2021-08-13 ENCOUNTER — Ambulatory Visit (INDEPENDENT_AMBULATORY_CARE_PROVIDER_SITE_OTHER): Payer: 59 | Admitting: Family Medicine

## 2021-08-13 ENCOUNTER — Encounter: Payer: Self-pay | Admitting: Family Medicine

## 2021-08-13 VITALS — BP 138/88 | HR 63 | Ht 79.0 in | Wt 250.0 lb

## 2021-08-13 DIAGNOSIS — H9193 Unspecified hearing loss, bilateral: Secondary | ICD-10-CM

## 2021-08-13 DIAGNOSIS — H9190 Unspecified hearing loss, unspecified ear: Secondary | ICD-10-CM | POA: Diagnosis not present

## 2021-08-13 DIAGNOSIS — E039 Hypothyroidism, unspecified: Secondary | ICD-10-CM

## 2021-08-13 DIAGNOSIS — Z1159 Encounter for screening for other viral diseases: Secondary | ICD-10-CM

## 2021-08-13 DIAGNOSIS — Z1210 Encounter for screening for malignant neoplasm of intestinal tract, unspecified: Secondary | ICD-10-CM

## 2021-08-13 DIAGNOSIS — R7301 Impaired fasting glucose: Secondary | ICD-10-CM

## 2021-08-13 DIAGNOSIS — Z23 Encounter for immunization: Secondary | ICD-10-CM | POA: Diagnosis not present

## 2021-08-13 DIAGNOSIS — I10 Essential (primary) hypertension: Secondary | ICD-10-CM

## 2021-08-13 DIAGNOSIS — E559 Vitamin D deficiency, unspecified: Secondary | ICD-10-CM

## 2021-08-13 NOTE — Progress Notes (Addendum)
? ?New Patient Office Visit ? ?Subjective:  ?Patient ID: [name], male    DOB: 1945/11/02  Age: 76 y.o. MRN: [id number] ? ?CC:  ?Chief Complaint  ?Patient presents with  ? New Patient (Initial Visit)  ?  Was just discharged by [facility], assisted living in [place], here to establish care.   ? ? ?HPI ?[name] is a 76 y.o. male with past medical history of multiple prior spinal surgeries recently status post having a L3-5 decompression and fusion surgery with Dr. Deri Fuelling on 06/29/2021 presents for establishing care. He did well postoperatively and was discharged on 07/01/2021 to his ALF. He received home health PT services and resides at his friend's home. He reports looking for a place to stay. He reports feeling fatigued and anorexia with increased worry due to his living conditions. He reports neck and back pain but refuses to take pain medicine, saying that pain medicines make him feel dead. ? ?He reports that he is hard of hearing bilaterally. He does not wear hearing aids. He denies any breathing problems and is noted to have over 6o years of smoking history. He currently smokes a pack a week. ? ?He received the Tdap and PPSV23 today. ?Past Medical History:  ?Diagnosis Date  ? Arthritis   ? Bronchitis   ? hx of  ? COPD (chronic obstructive pulmonary disease) ([place])   ? GERD (gastroesophageal reflux disease)   ? History of kidney stones   ? Hypertension   ? Hypothyroidism   ? 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 Restless leg syndrome   ? ? ?Past Surgical History:  ?Procedure Laterality Date  ? BACK SURGERY    ? Fusion 2005  ? Graves Disease    ? JOINT REPLACEMENT  1970  ? Right knee   ? LAMINECTOMY WITH POSTERIOR LATERAL ARTHRODESIS LEVEL 2 N/A 06/29/2021  ? Procedure: Laminectomy - Lumbar one-two, Lumbar two-three - redo with posterior lateral fusion with pedicle screws;  Surgeon: Earnie Larsson, MD;  Location: [facility];  Service: Neurosurgery;  Laterality: N/A;  ? NECK SURGERY    ? POSTERIOR  CERVICAL FUSION/FORAMINOTOMY  12/05/2011  ? Procedure: POSTERIOR CERVICAL FUSION/FORAMINOTOMY LEVEL 4;  Surgeon: Faythe Ghee, MD;  Location: [facility];  Service: Neurosurgery;  Laterality: N/A;  Posterior Cervical fixation Cervical four-Seven,Cervical Decompresson Cervical three  ? THORACIC DISCECTOMY N/A 01/08/2021  ? Procedure: Thoracic One-Two, Thoracic Two-Three, Thoracic Eleven-Twelve, Thoracic Twelve-Lumbar One Laminectomy;  Surgeon: Earnie Larsson, MD;  Location: [facility];  Service: Neurosurgery;  Laterality: N/A;  ? ? ?Family History  ?Problem Relation Age of Onset  ? Heart attack Mother 4  ? Heart disease Father   ? ? ?Social History  ? ?Socioeconomic History  ? Marital status: Married  ?  Spouse name: Not on file  ? Number of children: Not on file  ? Years of education: Not on file  ? Highest education level: Not on file  ?Occupational History  ? Not on file  ?Tobacco Use  ? Smoking status: Some Days  ?  Packs/day: 0.25  ?  Years: 50.00  ?  Pack years: 12.50  ?  Types: Cigarettes  ? Smokeless tobacco: Never  ?Vaping Use  ? Vaping Use: Never used  ?Substance and Sexual Activity  ? Alcohol use: No  ? Drug use: No  ? Sexual activity: Not on file  ?Other Topics Concern  ? Not on file  ?Social History Narrative  ? Not on file  ? ?  Social Determinants of Health  ? ?Financial Resource Strain: Not on file  ?Food Insecurity: Not on file  ?Transportation Needs: Not on file  ?Physical Activity: Not on file  ?Stress: Not on file  ?Social Connections: Not on file  ?Intimate Partner Violence: Not on file  ? ? ?ROS ?Review of Systems  ?Constitutional:  Negative for chills, fatigue and fever.  ?HENT:  Positive for hearing loss (decrease hearing in both ears). Negative for sinus pressure, sinus pain and sore throat.   ?Eyes:  Negative for pain, redness and visual disturbance.  ?Respiratory:  Negative for chest tightness and shortness of breath.   ?Cardiovascular:  Negative for chest pain and palpitations.   ?Gastrointestinal:  Negative for constipation, diarrhea, nausea and vomiting.  ?Endocrine: Negative for polydipsia, polyphagia and polyuria.  ?Genitourinary:  Negative for frequency and urgency.  ?Musculoskeletal:  Positive for back pain and neck pain.  ?Skin:  Positive for wound (right  thumb). Negative for color change.  ?Neurological:  Positive for weakness and numbness. Negative for headaches.  ?Hematological:  Bruises/bleeds easily (due to blood thinners).  ?Psychiatric/Behavioral:  Positive for sleep disturbance. Negative for self-injury.   ? ?Objective:  ? ?Today's Vitals: BP 138/88 (BP Location: Right Arm, Patient Position: Sitting)   Pulse 63   Ht 6\' 7"  (2.007 m)   Wt 250 lb (113.4 kg)   SpO2 98%   BMI 28.16 kg/m?  ? ?Physical Exam ?Constitutional:   ?   Appearance: Normal appearance.  ?HENT:  ?   Head: Normocephalic.  ?   Right Ear: There is impacted cerumen.  ?   Left Ear: There is impacted cerumen.  ?   Mouth/Throat:  ?   Mouth: Mucous membranes are moist.  ?   Dentition: Has dentures.  ?Eyes:  ?   Extraocular Movements: Extraocular movements intact.  ?   Pupils: Pupils are equal, round, and reactive to light.  ?Cardiovascular:  ?   Rate and Rhythm: Normal rate and regular rhythm.  ?   Pulses: Normal pulses.  ?   Heart sounds: Normal heart sounds.  ?Pulmonary:  ?   Effort: Pulmonary effort is normal.  ?   Breath sounds: Normal breath sounds.  ?Abdominal:  ?   Palpations: Abdomen is soft.  ?Musculoskeletal:  ?   Cervical back: No rigidity or tenderness.  ?   Right lower leg: No edema.  ?   Comments: Unable to assess gait because pt is in the wheelchair  ?Skin: ?   General: Skin is warm.  ?   Findings: Laceration (right thumb the size of a dime) and lesion present.  ?Neurological:  ?   Mental Status: He is alert and oriented to person, place, and time.  ?Psychiatric:  ?   Comments: Normal affect  ? ? ?Assessment & Plan:  ? ?Problem List Items Addressed This Visit   ? ?  ? Cardiovascular and  Mediastinum  ? HTN (hypertension) (Chronic)  ?  -Controlled ?-Continue treatment regimen ? ?  ?  ? Relevant Orders  ? Urine Microalbumin w/creat. ratio  ?  ? Endocrine  ? Hypothyroidism - Primary  ?  ? Nervous and Auditory  ? Hard of hearing  ?  Ears irrigation performed ? ?  ?  ? ?Other Visit Diagnoses   ? ? Special screening for malignant neoplasm of intestine      ? Relevant Orders  ? Ambulatory referral to Gastroenterology  ? Need for Tdap vaccination      ? Relevant  Orders  ? Tdap vaccine greater than or equal to 7yo IM (Completed)  ? Need for shingles vaccine      ? Need for pneumococcal vaccination      ? Relevant Orders  ? Pneumococcal polysaccharide vaccine 23-valent greater than or equal to 2yo subcutaneous/IM (Completed)  ? Need for hepatitis C screening test      ? Relevant Orders  ? Hepatitis C Antibody  ? Vitamin D deficiency      ? Relevant Orders  ? Vitamin D (25 hydroxy)  ? IFG (impaired fasting glucose)      ? Relevant Orders  ? Hemoglobin A1c  ? ?  ? ? ?Outpatient Encounter Medications as of 08/13/2021  ?Medication Sig  ? aspirin 81 MG chewable tablet Chew 81 mg by mouth daily.  ? Cholecalciferol (VITAMIN D3) 1.25 MG (50000 UT) TABS Take 50,000 Units by mouth every Wednesday.  ? cholecalciferol (VITAMIN D3) 25 MCG (1000 UNIT) tablet Take 1,000 Units by mouth daily.  ? DULoxetine (CYMBALTA) 60 MG capsule Take 60 mg by mouth daily.  ? levothyroxine (SYNTHROID) 150 MCG tablet Take 150 mcg by mouth daily before breakfast.  ? simvastatin (ZOCOR) 20 MG tablet Take 20 mg by mouth at bedtime.  ? tamsulosin (FLOMAX) 0.4 MG CAPS capsule Take 1 capsule (0.4 mg total) by mouth in the morning and at bedtime.  ? trimethoprim (TRIMPEX) 100 MG tablet Take 1 tablet (100 mg total) by mouth at bedtime.  ? vitamin B-12 (CYANOCOBALAMIN) 1000 MCG tablet Take 1,000 mcg by mouth daily.  ? loperamide (IMODIUM) 2 MG capsule Take 2 mg by mouth as needed for diarrhea or loose stools. One capsule with each loose stool - max of  5 doses in 24 hours. (Patient not taking: Reported on 08/13/2021)  ? ?No facility-administered encounter medications on file as of 08/13/2021.  ? ? ?Follow-up: Return in about 3 months (around 11/13/2021).  ? ?Alvira Monday, FNP ?

## 2021-08-13 NOTE — Patient Instructions (Addendum)
I appreciate the opportunity to provide care to you today! ?  ?Follow up: 3 months ? ?Labs: please stop by the lab to have your blood drawn  ? ?Screening: hep C ? ?Thank you for getting your Tdap, PPSV23, and vaccine today ? ?You can get the shingles vaccine at your local pharmacy ? ?Referral to GI for colonoscopy ? ? ?  ?It was a pleasure to see you and I look forward to continuing to work together on your health and well-being. ?Please do not hesitate to call the office if you need care or have questions about your care. ?  ?Have a wonderful day and week. ?With Gratitude, ?Alvira Monday MSN, FNP-BC ? ?

## 2021-08-14 DIAGNOSIS — H9190 Unspecified hearing loss, unspecified ear: Secondary | ICD-10-CM | POA: Insufficient documentation

## 2021-08-14 NOTE — Assessment & Plan Note (Signed)
Ears irrigation performed ?

## 2021-08-14 NOTE — Assessment & Plan Note (Signed)
Controlled Continue treatment regimen 

## 2021-08-31 ENCOUNTER — Ambulatory Visit: Payer: 59 | Admitting: Family Medicine

## 2021-09-04 ENCOUNTER — Other Ambulatory Visit: Payer: Self-pay | Admitting: *Deleted

## 2021-09-04 MED ORDER — LEVOTHYROXINE SODIUM 150 MCG PO TABS: 150.0000 ug | ORAL_TABLET | Freq: Every day | ORAL | 0 refills | Status: DC

## 2021-09-04 MED ORDER — DULOXETINE HCL 60 MG PO CPEP: 60.0000 mg | ORAL_CAPSULE | Freq: Every day | ORAL | 0 refills | Status: DC

## 2021-09-04 MED ORDER — ASPIRIN 81 MG PO CHEW
81.0000 mg | CHEWABLE_TABLET | Freq: Every day | ORAL | 0 refills | Status: AC
Start: 2021-09-04 — End: ?

## 2021-09-12 ENCOUNTER — Encounter: Payer: Self-pay | Admitting: *Deleted

## 2021-09-14 ENCOUNTER — Telehealth: Payer: Self-pay

## 2021-09-14 ENCOUNTER — Other Ambulatory Visit: Payer: Self-pay | Admitting: Family Medicine

## 2021-09-14 DIAGNOSIS — N138 Other obstructive and reflux uropathy: Secondary | ICD-10-CM

## 2021-09-14 DIAGNOSIS — N3000 Acute cystitis without hematuria: Secondary | ICD-10-CM

## 2021-09-14 NOTE — Telephone Encounter (Signed)
Patients wife called advising that patient needed a refill on medication.   Medication: trimethoprim (TRIMPEX) 100 MG tablet   Pharmacy: [facility] in [place]

## 2021-09-15 MED ORDER — TRIMETHOPRIM 100 MG PO TABS: 100.0000 mg | ORAL_TABLET | Freq: Every evening | ORAL | 11 refills | Status: DC

## 2021-09-15 NOTE — Telephone Encounter (Signed)
Refill submitted. 

## 2021-09-18 ENCOUNTER — Ambulatory Visit (INDEPENDENT_AMBULATORY_CARE_PROVIDER_SITE_OTHER): Payer: 59 | Admitting: *Deleted

## 2021-09-18 ENCOUNTER — Ambulatory Visit: Payer: 59

## 2021-09-18 ENCOUNTER — Telehealth: Payer: Self-pay | Admitting: *Deleted

## 2021-09-18 DIAGNOSIS — Z1211 Encounter for screening for malignant neoplasm of colon: Secondary | ICD-10-CM

## 2021-09-18 DIAGNOSIS — Z0000 Encounter for general adult medical examination without abnormal findings: Secondary | ICD-10-CM

## 2021-09-18 NOTE — Telephone Encounter (Signed)
Left vm requesting a return call.

## 2021-09-18 NOTE — Telephone Encounter (Signed)
Spoke with pt, looks like Urology filled his trimpex, he would like for [place] to fill his tamsulosin is this okay to do?

## 2021-09-18 NOTE — Patient Instructions (Signed)
Mr. [name] , Thank you for taking time to come for your Medicare Wellness Visit. I appreciate your ongoing commitment to your health goals. Please review the following plan we discussed and let me know if I can assist you in the future.   Screening recommendations/referrals: Colonoscopy: cologuard ordered Recommended yearly ophthalmology/optometry visit for glaucoma screening and checkup Recommended yearly dental visit for hygiene and checkup  Vaccinations: Influenza vaccine: completed Pneumococcal vaccine: completed Tdap vaccine: completed Shingles vaccine: due now    Advanced directives: copy requested  Conditions/risks identified: Hypertension  Next appointment: 1 year   Preventive Care 55 Years and Older, Male Preventive care refers to lifestyle choices and visits with your health care provider that can promote health and wellness. What does preventive care include? A yearly physical exam. This is also called an annual well check. Dental exams once or twice a year. Routine eye exams. Ask your health care provider how often you should have your eyes checked. Personal lifestyle choices, including: Daily care of your teeth and gums. Regular physical activity. Eating a healthy diet. Avoiding tobacco and drug use. Limiting alcohol use. Practicing safe sex. Taking low doses of aspirin every day. Taking vitamin and mineral supplements as recommended by your health care provider. What happens during an annual well check? The services and screenings done by your health care provider during your annual well check will depend on your age, overall health, lifestyle risk factors, and family history of disease. Counseling  Your health care provider may ask you questions about your: Alcohol use. Tobacco use. Drug use. Emotional well-being. Home and relationship well-being. Sexual activity. Eating habits. History of falls. Memory and ability to understand (cognition). Work and  work Astronomer. Screening  You may have the following tests or measurements: Height, weight, and BMI. Blood pressure. Lipid and cholesterol levels. These may be checked every 5 years, or more frequently if you are over 73 years old. Skin check. Lung cancer screening. You may have this screening every year starting at age 88 if you have a 30-pack-year history of smoking and currently smoke or have quit within the past 15 years. Fecal occult blood test (FOBT) of the stool. You may have this test every year starting at age 76. Flexible sigmoidoscopy or colonoscopy. You may have a sigmoidoscopy every 5 years or a colonoscopy every 10 years starting at age 60. Prostate cancer screening. Recommendations will vary depending on your family history and other risks. Hepatitis C blood test. Hepatitis B blood test. Sexually transmitted disease (STD) testing. Diabetes screening. This is done by checking your blood sugar (glucose) after you have not eaten for a while (fasting). You may have this done every 1-3 years. Abdominal aortic aneurysm (AAA) screening. You may need this if you are a current or former smoker. Osteoporosis. You may be screened starting at age 17 if you are at high risk. Talk with your health care provider about your test results, treatment options, and if necessary, the need for more tests. Vaccines  Your health care provider may recommend certain vaccines, such as: Influenza vaccine. This is recommended every year. Tetanus, diphtheria, and acellular pertussis (Tdap, Td) vaccine. You may need a Td booster every 10 years. Zoster vaccine. You may need this after age 74. Pneumococcal 13-valent conjugate (PCV13) vaccine. One dose is recommended after age 27. Pneumococcal polysaccharide (PPSV23) vaccine. One dose is recommended after age 17. Talk to your health care provider about which screenings and vaccines you need and how often you need them.  This information is not intended to  replace advice given to you by your health care provider. Make sure you discuss any questions you have with your health care provider. Document Released: 04/28/2015 Document Revised: 12/20/2015 Document Reviewed: 01/31/2015 Elsevier Interactive Patient Education  2017 [place] [employer] Prevention in the Home Falls can cause injuries. They can happen to people of all ages. There are many things you can do to make your home safe and to help prevent falls. What can I do on the outside of my home? Regularly fix the edges of walkways and driveways and fix any cracks. Remove anything that might make you trip as you walk through a door, such as a raised step or threshold. Trim any bushes or trees on the path to your home. Use bright outdoor lighting. Clear any walking paths of anything that might make someone trip, such as rocks or tools. Regularly check to see if handrails are loose or broken. Make sure that both sides of any steps have handrails. Any raised decks and porches should have guardrails on the edges. Have any leaves, snow, or ice cleared regularly. Use sand or salt on walking paths during winter. Clean up any spills in your garage right away. This includes oil or grease spills. What can I do in the bathroom? Use night lights. Install grab bars by the toilet and in the tub and shower. Do not use towel bars as grab bars. Use non-skid mats or decals in the tub or shower. If you need to sit down in the shower, use a plastic, non-slip stool. Keep the floor dry. Clean up any water that spills on the floor as soon as it happens. Remove soap buildup in the tub or shower regularly. Attach bath mats securely with double-sided non-slip rug tape. Do not have throw rugs and other things on the floor that can make you trip. What can I do in the bedroom? Use night lights. Make sure that you have a light by your bed that is easy to reach. Do not use any sheets or blankets that are too big for  your bed. They should not hang down onto the floor. Have a firm chair that has side arms. You can use this for support while you get dressed. Do not have throw rugs and other things on the floor that can make you trip. What can I do in the kitchen? Clean up any spills right away. Avoid walking on wet floors. Keep items that you use a lot in easy-to-reach places. If you need to reach something above you, use a strong step stool that has a grab bar. Keep electrical cords out of the way. Do not use floor polish or wax that makes floors slippery. If you must use wax, use non-skid floor wax. Do not have throw rugs and other things on the floor that can make you trip. What can I do with my stairs? Do not leave any items on the stairs. Make sure that there are handrails on both sides of the stairs and use them. Fix handrails that are broken or loose. Make sure that handrails are as long as the stairways. Check any carpeting to make sure that it is firmly attached to the stairs. Fix any carpet that is loose or worn. Avoid having throw rugs at the top or bottom of the stairs. If you do have throw rugs, attach them to the floor with carpet tape. Make sure that you have a light switch at the  top of the stairs and the bottom of the stairs. If you do not have them, ask someone to add them for you. What else can I do to help prevent falls? Wear shoes that: Do not have high heels. Have rubber bottoms. Are comfortable and fit you well. Are closed at the toe. Do not wear sandals. If you use a stepladder: Make sure that it is fully opened. Do not climb a closed stepladder. Make sure that both sides of the stepladder are locked into place. Ask someone to hold it for you, if possible. Clearly mark and make sure that you can see: Any grab bars or handrails. First and last steps. Where the edge of each step is. Use tools that help you move around (mobility aids) if they are needed. These  include: Canes. Walkers. Scooters. Crutches. Turn on the lights when you go into a dark area. Replace any light bulbs as soon as they burn out. Set up your furniture so you have a clear path. Avoid moving your furniture around. If any of your floors are uneven, fix them. If there are any pets around you, be aware of where they are. Review your medicines with your doctor. Some medicines can make you feel dizzy. This can increase your chance of falling. Ask your doctor what other things that you can do to help prevent falls. This information is not intended to replace advice given to you by your health care provider. Make sure you discuss any questions you have with your health care provider. Document Released: 01/26/2009 Document Revised: 09/07/2015 Document Reviewed: 05/06/2014 Elsevier Interactive Patient Education  2017 [employer].

## 2021-09-18 NOTE — Telephone Encounter (Signed)
Spoke with pt, states he is bruised from his arm not sure if it could be related to vaccines from last office visit, since that was a month ago, please advice for any recommendations?

## 2021-09-18 NOTE — Telephone Encounter (Signed)
Pt still has bruise and right arm is hurting since vaccine please call the patient

## 2021-09-18 NOTE — Progress Notes (Signed)
Subjective:   [name] is a 76 y.o. male who presents for an Initial Medicare Annual Wellness Visit.  I connected with  Colbert Coyer on 09/18/21 by a audio enabled telemedicine application and verified that I am speaking with the correct person using two identifiers.  Patient Location: Home  Provider Location: Office/Clinic  I discussed the limitations of evaluation and management by telemedicine. The patient expressed understanding and agreed to proceed.  Review of Systems     Mr. [name] , Thank you for taking time to come for your Medicare Wellness Visit. I appreciate your ongoing commitment to your health goals. Please review the following plan we discussed and let me know if I can assist you in the future.   These are the goals we discussed:  Goals   None     This is a list of the screening recommended for you and due dates:  Health Maintenance  Topic Date Due   COVID-19 Vaccine (1) Never done   Urine Protein Check  Never done   Hepatitis C Screening: USPSTF Recommendation to screen - Ages 21-79 yo.  Never done   Zoster (Shingles) Vaccine (1 of 2) Never done   Colon Cancer Screening  Never done   Flu Shot  11/13/2021   Pneumonia Vaccine (2 - PCV) 08/14/2022   Tetanus Vaccine  08/14/2031   HPV Vaccine  Aged Out          Objective:    There were no vitals filed for this visit. There is no height or weight on file to calculate BMI.     07/17/2021    5:40 PM 07/17/2021   11:03 AM 07/14/2021    2:04 PM 06/29/2021    9:05 AM 01/19/2021    3:37 PM 01/08/2021    7:10 AM 01/30/2019   11:00 PM  Advanced Directives  Does Patient Have a Medical Advance Directive?  Yes Yes Yes Yes Yes No  Type of Estate agent of [place];Living will Living will Healthcare Power of [place];Living will Healthcare Power of [place];Living will Out of facility DNR (pink MOST or yellow form) Healthcare Power of [place];Out of facility DNR (pink MOST or  yellow form)   Does patient want to make changes to medical advance directive? No - Patient declined  No - Patient declined No - Patient declined No - Patient declined    Copy of Healthcare Power of Attorney in Chart?   No - copy requested No - copy requested     Would patient like information on creating a medical advance directive?       No - Patient declined  Pre-existing out of facility DNR order (yellow form or pink MOST form)     Yellow form placed in chart (order not valid for inpatient use)      Current Medications (verified) Outpatient Encounter Medications as of 09/18/2021  Medication Sig   aspirin 81 MG chewable tablet Chew 1 tablet (81 mg total) by mouth daily.   Cholecalciferol (VITAMIN D3) 1.25 MG (50000 UT) TABS Take 50,000 Units by mouth every Wednesday.   cholecalciferol (VITAMIN D3) 25 MCG (1000 UNIT) tablet Take 1,000 Units by mouth daily.   DULoxetine (CYMBALTA) 60 MG capsule Take 1 capsule (60 mg total) by mouth daily.   levothyroxine (SYNTHROID) 150 MCG tablet Take 1 tablet (150 mcg total) by mouth daily before breakfast.   loperamide (IMODIUM) 2 MG capsule Take 2 mg by mouth as needed for diarrhea or loose stools.  One capsule with each loose stool - max of 5 doses in 24 hours. (Patient not taking: Reported on 08/13/2021)   simvastatin (ZOCOR) 20 MG tablet Take 20 mg by mouth at bedtime.   tamsulosin (FLOMAX) 0.4 MG CAPS capsule Take 1 capsule (0.4 mg total) by mouth in the morning and at bedtime.   trimethoprim (TRIMPEX) 100 MG tablet Take 1 tablet (100 mg total) by mouth at bedtime.   vitamin B-12 (CYANOCOBALAMIN) 1000 MCG tablet Take 1,000 mcg by mouth daily.   No facility-administered encounter medications on file as of 09/18/2021.    Allergies (verified) Sulfa antibiotics   History: Past Medical History:  Diagnosis Date   Arthritis    Bronchitis    hx of   COPD (chronic obstructive pulmonary disease) (HCC)    GERD (gastroesophageal reflux disease)    History of  kidney stones    Hypertension    Hypothyroidism    Macular degeneration    right eye   Restless leg syndrome    Past Surgical History:  Procedure Laterality Date   BACK SURGERY     Fusion 2005   Graves Disease     JOINT REPLACEMENT  1970   Right knee    LAMINECTOMY WITH POSTERIOR LATERAL ARTHRODESIS LEVEL 2 N/A 06/29/2021   Procedure: Laminectomy - Lumbar one-two, Lumbar two-three - redo with posterior lateral fusion with pedicle screws;  Surgeon: Julio Sicks, MD;  Location: [facility] OR;  Service: Neurosurgery;  Laterality: N/A;   NECK SURGERY     POSTERIOR CERVICAL FUSION/FORAMINOTOMY  12/05/2011   Procedure: POSTERIOR CERVICAL FUSION/FORAMINOTOMY LEVEL 4;  Surgeon: Reinaldo Meeker, MD;  Location: [facility] OR[facility];  Service: Neurosurgery;  Laterality: N/A;  Posterior Cervical fixation Cervical four-Seven,Cervical Decompresson Cervical three   THORACIC DISCECTOMY N/A 01/08/2021   Procedure: Thoracic One-Two, Thoracic Two-Three, Thoracic Eleven-Twelve, Thoracic Twelve-Lumbar One Laminectomy;  Surgeon: Julio Sicks, MD;  Location: [facility] OR;  Service: Neurosurgery;  Laterality: N/A;   Family History  Problem Relation Age of Onset   Heart attack Mother 81   Heart disease Father    Social History   Socioeconomic History   Marital status: Married    Spouse name: Not on file   Number of children: Not on file   Years of education: Not on file   Highest education level: Not on file  Occupational History   Not on file  Tobacco Use   Smoking status: Some Days    Packs/day: 0.25    Years: 50.00    Pack years: 12.50    Types: Cigarettes   Smokeless tobacco: Never  Vaping Use   Vaping Use: Never used  Substance and Sexual Activity   Alcohol use: No   Drug use: No   Sexual activity: Not on file  Other Topics Concern   Not on file  Social History Narrative   Not on file   Social Determinants of Health   Financial Resource Strain: Not on file  Food Insecurity: Not on file  Transportation  Needs: Not on file  Physical Activity: Not on file  Stress: Not on file  Social Connections: Not on file    Tobacco Counseling Ready to quit: Not Answered Counseling given: Not Answered   Clinical Intake:                 Diabetic?no         Activities of Daily Living    07/17/2021    5:40 PM 06/29/2021    9:04 AM  In your present state of health, do you have any difficulty performing the following activities:  Hearing? 0   Vision? 0   Difficulty concentrating or making decisions? 0   Walking or climbing stairs? 1   Dressing or bathing? 1   Doing errands, shopping? 1 0    Patient Care Team: Gilmore Laroche, FNP as PCP - General (Family Medicine) Croitoru, Rachelle Hora, MD as PCP - Cardiology (Cardiology)  Indicate any recent Medical Services you may have received from other than [facility] providers in the past year (date may be approximate).     Assessment:   This is a routine wellness examination for [name].  Hearing/Vision screen No results found.  Dietary issues and exercise activities discussed:     Goals Addressed   None   Depression Screen    08/13/2021    1:43 PM  PHQ 2/9 Scores  PHQ - 2 Score 6  PHQ- 9 Score 21    Fall Risk    08/13/2021    1:43 PM  Fall Risk   Falls in the past year? 0  Number falls in past yr: 0  Injury with Fall? 0  Risk for fall due to : No Fall Risks  Follow up Falls evaluation completed    FALL RISK PREVENTION PERTAINING TO THE HOME:  Any stairs in or around the home? No  If so, are there any without handrails? No  Home free of loose throw rugs in walkways, pet beds, electrical cords, etc? Yes  Adequate lighting in your home to reduce risk of falls? Yes   ASSISTIVE DEVICES UTILIZED TO PREVENT FALLS:  Life alert? No  Use of a cane, walker or w/c? Yes  Grab bars in the bathroom? Yes  Shower chair or bench in shower? Yes  Elevated toilet seat or a handicapped toilet? Yes    Cognitive Function:         Immunizations Immunization History  Administered Date(s) Administered   Pneumococcal Polysaccharide-23 08/13/2021   Tdap 08/13/2021    TDAP status: Up to date  Flu Vaccine status: Up to date  Pneumococcal vaccine status: Up to date  Covid Vaccines: Up to date  Qualifies for Shingles Vaccine? Yes   Zostavax completed No   Shingrix Completed?: No.    Education has been provided regarding the importance of this vaccine. Patient has been advised to call insurance company to determine out of pocket expense if they have not yet received this vaccine. Advised may also receive vaccine at local pharmacy or Health Dept. Verbalized acceptance and understanding.  Screening Tests Health Maintenance  Topic Date Due   COVID-19 Vaccine (1) Never done   URINE MICROALBUMIN  Never done   Hepatitis C Screening  Never done   Zoster Vaccines- Shingrix (1 of 2) Never done   COLONOSCOPY (Pts 45-10yrs Insurance coverage will need to be confirmed)  Never done   INFLUENZA VACCINE  11/13/2021   Pneumonia Vaccine 15+ Years old (2 - PCV) 08/14/2022   TETANUS/TDAP  08/14/2031   HPV VACCINES  Aged Out    Health Maintenance  Health Maintenance Due  Topic Date Due   COVID-19 Vaccine (1) Never done   URINE MICROALBUMIN  Never done   Hepatitis C Screening  Never done   Zoster Vaccines- Shingrix (1 of 2) Never done   COLONOSCOPY (Pts 45-56yrs Insurance coverage will need to be confirmed)  Never done    Colorectal cancer screening: Referral to GI placed Cologuard ordered 09-18-21. Pt aware the office will  call re: appt.  Lung Cancer Screening: (Low Dose CT Chest recommended if Age 5-80 years, 30 pack-year currently smoking OR have quit w/in 15years.) does not qualify.    Additional Screening:  Hepatitis C Screening: does qualify; Ordered to have drawn with next labs  Vision Screening: Recommended annual ophthalmology exams for early detection of glaucoma and other disorders of the eye. Is the  patient up to date with their annual eye exam?  No  Who is the provider or what is the name of the office in which the patient attends annual eye exams? [facility] If pt is not established with a provider, would they like to be referred to a provider to establish care? No .   Dental Screening: Recommended annual dental exams for proper oral hygiene  Community Resource Referral / Chronic Care Management: CRR required this visit?  No   CCM required this visit?  No      Plan:     I have personally reviewed and noted the following in the patient's chart:   Medical and social history Use of alcohol, tobacco or illicit drugs  Current medications and supplements including opioid prescriptions. Patient is not currently taking opioid prescriptions. Functional ability and status Nutritional status Physical activity Advanced directives List of other physicians Hospitalizations, surgeries, and ER visits in previous 12 months Vitals Screenings to include cognitive, depression, and falls Referrals and appointments  In addition, I have reviewed and discussed with patient certain preventive protocols, quality metrics, and best practice recommendations. A written personalized care plan for preventive services as well as general preventive health recommendations were provided to patient.     Park BreedShana R Kapri Nero, CMA   09/18/2021   Nurse Notes:  Mr. Leilani MerlCliborne , Thank you for taking time to come for your Medicare Wellness Visit. I appreciate your ongoing commitment to your health goals. Please review the following plan we discussed and let me know if I can assist you in the future.   These are the goals we discussed:  Goals   None     This is a list of the screening recommended for you and due dates:  Health Maintenance  Topic Date Due   COVID-19 Vaccine (1) Never done   Urine Protein Check  Never done   Hepatitis C Screening: USPSTF Recommendation to screen - Ages 6318-79 yo.  Never done    Zoster (Shingles) Vaccine (1 of 2) Never done   Colon Cancer Screening  Never done   Flu Shot  11/13/2021   Pneumonia Vaccine (2 - PCV) 08/14/2022   Tetanus Vaccine  08/14/2031   HPV Vaccine  Aged Out

## 2021-09-18 NOTE — Telephone Encounter (Signed)
Pt needs refill on trimpex and tamsulosin

## 2021-09-19 ENCOUNTER — Other Ambulatory Visit: Payer: Self-pay

## 2021-09-19 ENCOUNTER — Ambulatory Visit: Payer: Medicare Other | Admitting: Urology

## 2021-09-19 DIAGNOSIS — N401 Enlarged prostate with lower urinary tract symptoms: Secondary | ICD-10-CM

## 2021-09-19 MED ORDER — TAMSULOSIN HCL 0.4 MG PO CAPS: 0.4000 mg | ORAL_CAPSULE | Freq: Two times a day (BID) | ORAL | 11 refills | Status: DC

## 2021-09-19 NOTE — Telephone Encounter (Signed)
Refills sent

## 2021-09-20 ENCOUNTER — Telehealth: Payer: Self-pay | Admitting: Family Medicine

## 2021-09-20 ENCOUNTER — Telehealth: Payer: Self-pay

## 2021-09-20 ENCOUNTER — Other Ambulatory Visit: Payer: Self-pay | Admitting: Family Medicine

## 2021-09-20 ENCOUNTER — Other Ambulatory Visit: Payer: Self-pay | Admitting: *Deleted

## 2021-09-20 DIAGNOSIS — N401 Enlarged prostate with lower urinary tract symptoms: Secondary | ICD-10-CM

## 2021-09-20 DIAGNOSIS — I10 Essential (primary) hypertension: Secondary | ICD-10-CM

## 2021-09-20 MED ORDER — TAMSULOSIN HCL 0.4 MG PO CAPS: 0.4000 mg | ORAL_CAPSULE | Freq: Two times a day (BID) | ORAL | 11 refills | Status: DC

## 2021-09-20 MED ORDER — BENAZEPRIL HCL 20 MG PO TABS: 20.0000 mg | ORAL_TABLET | Freq: Every day | ORAL | 3 refills | Status: DC

## 2021-09-20 NOTE — Telephone Encounter (Signed)
Attempted to reach back out to pharmacy and stayed on hold please let her know this was sent to that pharmacy by mistake has been resent to pt pharmacy at [facility] as he is no longer in a facility

## 2021-09-20 NOTE — Telephone Encounter (Signed)
Called and pharmacy informed

## 2021-09-20 NOTE — Telephone Encounter (Signed)
[name] called from [facility]  [phone].  received rx for Flomax does not have him in their system. Where is he located is he in a facility? Please  return call to [phone]

## 2021-09-20 NOTE — Telephone Encounter (Signed)
Pt had pharmacist with [facility] call in regards to his medication. Simvistatin 20 mg & Benazepil 20mg , pt is unsure how he needs to take these (hasn't had them since April). Can you please contact pt in regards to this?       [facility]

## 2021-09-21 NOTE — Telephone Encounter (Signed)
Attempted to reach pt unable to get a hold of him.

## 2021-10-05 ENCOUNTER — Telehealth: Payer: Self-pay | Admitting: Family Medicine

## 2021-10-12 NOTE — Progress Notes (Signed)
Please inform the patient to continue taking his simvastatin. All other labs are stable.

## 2021-10-13 LAB — LIPID PANEL
Chol/HDL Ratio: 5.3 ratio — ABNORMAL HIGH (ref 0.0–5.0)
Cholesterol, Total: 163 mg/dL (ref 100–199)
HDL: 31 mg/dL — ABNORMAL LOW (ref >39)
LDL Chol Calc (NIH): 115 mg/dL — ABNORMAL HIGH (ref 0–99)
Triglycerides: 92 mg/dL (ref 0–149)
VLDL Cholesterol Cal: 17 mg/dL (ref 5–40)

## 2021-10-13 LAB — VITAMIN D 25 HYDROXY (VIT D DEFICIENCY, FRACTURES): Vit D, 25-Hydroxy: 51.3 ng/mL (ref 30.0–100.0)

## 2021-10-13 LAB — MICROALBUMIN / CREATININE URINE RATIO
Creatinine, Urine: 42.1 mg/dL
Microalb/Creat Ratio: 107 mg/g creat — ABNORMAL HIGH (ref 0–29)
Microalbumin, Urine: 45.2 ug/mL

## 2021-10-13 LAB — TSH+FREE T4
Free T4: 1.7 ng/dL (ref 0.82–1.77)
TSH: 0.844 u[IU]/mL (ref 0.450–4.500)

## 2021-10-13 LAB — HEMOGLOBIN A1C
Est. average glucose Bld gHb Est-mCnc: 97 mg/dL
Hgb A1c MFr Bld: 5 % (ref 4.8–5.6)

## 2021-10-13 LAB — HEPATITIS C ANTIBODY: Hep C Virus Ab: NONREACTIVE

## 2021-10-17 LAB — COLOGUARD: COLOGUARD: NEGATIVE

## 2021-10-19 NOTE — Progress Notes (Signed)
Please inform the patient that his cologurad is negative for colon cancer.

## 2021-10-23 ENCOUNTER — Telehealth: Payer: Self-pay | Admitting: Family Medicine

## 2021-10-23 NOTE — Telephone Encounter (Signed)
Patient called in regard to cologuard results. 

## 2021-10-23 NOTE — Telephone Encounter (Signed)
Pt wife informed

## 2021-10-24 ENCOUNTER — Other Ambulatory Visit: Payer: Self-pay

## 2021-10-24 ENCOUNTER — Telehealth: Payer: Self-pay | Admitting: Family Medicine

## 2021-10-24 DIAGNOSIS — I10 Essential (primary) hypertension: Secondary | ICD-10-CM

## 2021-10-24 MED ORDER — SIMVASTATIN 20 MG PO TABS: 20.0000 mg | ORAL_TABLET | Freq: Every day | ORAL | 2 refills | Status: DC

## 2021-10-24 NOTE — Telephone Encounter (Signed)
Refill sent.

## 2021-10-24 NOTE — Progress Notes (Signed)
Refill

## 2021-10-24 NOTE — Telephone Encounter (Signed)
[name] with [facility] called in on patient behalf.  Patient needs refill on simvastatin (ZOCOR) 20 MG tablet     Sent in to [facility] # [phone]

## 2021-10-30 ENCOUNTER — Telehealth: Payer: Self-pay

## 2021-10-30 NOTE — Telephone Encounter (Signed)
Returned call unable to get in touch

## 2021-10-30 NOTE — Telephone Encounter (Signed)
Patient returning nurse call. Lab results

## 2021-10-31 NOTE — Telephone Encounter (Signed)
Attempted to contact pt multiple times.  

## 2021-11-07 ENCOUNTER — Other Ambulatory Visit: Payer: Self-pay

## 2021-11-07 MED ORDER — LEVOTHYROXINE SODIUM 150 MCG PO TABS: 150.0000 ug | ORAL_TABLET | Freq: Every day | ORAL | 0 refills | Status: DC

## 2021-11-07 MED ORDER — DULOXETINE HCL 60 MG PO CPEP: 60.0000 mg | ORAL_CAPSULE | Freq: Every day | ORAL | 0 refills | Status: DC

## 2021-11-13 ENCOUNTER — Ambulatory Visit: Payer: 59 | Admitting: Family Medicine

## 2021-11-19 ENCOUNTER — Other Ambulatory Visit: Payer: Self-pay | Admitting: Family Medicine

## 2021-11-19 DIAGNOSIS — I10 Essential (primary) hypertension: Secondary | ICD-10-CM

## 2021-12-04 ENCOUNTER — Telehealth: Payer: Self-pay | Admitting: Family Medicine

## 2021-12-04 NOTE — Telephone Encounter (Signed)
Pt wife called stating they are wanting a referral to an orthopedic surgeon. Can you please call and get details about what all they are wanting?

## 2021-12-05 NOTE — Telephone Encounter (Signed)
Returned call unable to reach pt.  

## 2021-12-07 NOTE — Telephone Encounter (Signed)
Have attempted to reach pt unable to get in touch.

## 2021-12-11 NOTE — Telephone Encounter (Signed)
Unable to reach pt

## 2021-12-28 ENCOUNTER — Other Ambulatory Visit: Payer: Self-pay | Admitting: Family Medicine

## 2021-12-28 DIAGNOSIS — M48062 Spinal stenosis, lumbar region with neurogenic claudication: Secondary | ICD-10-CM

## 2021-12-28 NOTE — Telephone Encounter (Signed)
Pt informed

## 2021-12-28 NOTE — Telephone Encounter (Signed)
Referral placed.

## 2021-12-28 NOTE — Telephone Encounter (Signed)
Spoke to [place] she would like a referral to IT trainer for [name], has no strength in his legs and pain also.

## 2021-12-28 NOTE — Telephone Encounter (Signed)
Patient wants call back in regard  to last tele    Must use spouses number

## 2022-01-08 ENCOUNTER — Ambulatory Visit: Payer: 59 | Admitting: Urology

## 2022-01-08 DIAGNOSIS — R339 Retention of urine, unspecified: Secondary | ICD-10-CM

## 2022-01-08 DIAGNOSIS — N3000 Acute cystitis without hematuria: Secondary | ICD-10-CM

## 2022-02-04 ENCOUNTER — Ambulatory Visit (INDEPENDENT_AMBULATORY_CARE_PROVIDER_SITE_OTHER): Payer: 59 | Admitting: Family Medicine

## 2022-02-04 ENCOUNTER — Other Ambulatory Visit: Payer: Self-pay | Admitting: Family Medicine

## 2022-02-04 ENCOUNTER — Encounter: Payer: Self-pay | Admitting: Family Medicine

## 2022-02-04 DIAGNOSIS — L299 Pruritus, unspecified: Secondary | ICD-10-CM | POA: Diagnosis not present

## 2022-02-04 DIAGNOSIS — W57XXXA Bitten or stung by nonvenomous insect and other nonvenomous arthropods, initial encounter: Secondary | ICD-10-CM

## 2022-02-04 MED ORDER — TRIAMCINOLONE ACETONIDE 0.1 % EX CREA: 1.0000 | TOPICAL_CREAM | Freq: Two times a day (BID) | CUTANEOUS | 2 refills | Status: DC

## 2022-02-04 NOTE — Progress Notes (Signed)
Virtual Visit via Telephone Note   This visit type was conducted via telephone. This format is felt to be most appropriate for this patient at this time.  The patient did not have access to video technology/had technical difficulties with video requiring transitioning to audio format only (telephone).  All issues noted in this document were discussed and addressed.  No physical exam could be performed with this format.  Evaluation Performed:  Follow-up visit  Date:  02/04/2022   ID:  [name], [name] 11-19-1945, MRN [id number]  Patient Location: Home Provider Location: Clinic  Participants: Patient Location of Patient: Home Location of Provider: clinic Consent was obtain for visit to be over via telehealth. I verified that I am speaking with the correct person using two identifiers.  PCP:  Gilmore Laroche, FNP   Chief Complaint:  bedbugs bites  History of Present Illness:    [name] is a 76 y.o. male with complaints of increased pruritus with bed bugs bites.  He would like to know what treatment to use to relieve the pruritus.  The patient does not have symptoms concerning for COVID-19 infection (fever, chills, cough, or new shortness of breath).   Past Medical, Surgical, Social History, Allergies, and Medications have been Reviewed.  Past Medical History:  Diagnosis Date   Arthritis    Bronchitis    hx of   COPD (chronic obstructive pulmonary disease) (HCC)    GERD (gastroesophageal reflux disease)    History of kidney stones    Hypertension    Hypothyroidism    Macular degeneration    right eye   Restless leg syndrome    Past Surgical History:  Procedure Laterality Date   BACK SURGERY     Fusion 2005   Graves Disease     JOINT REPLACEMENT  1970   Right knee    LAMINECTOMY WITH POSTERIOR LATERAL ARTHRODESIS LEVEL 2 N/A 06/29/2021   Procedure: Laminectomy - Lumbar one-two, Lumbar two-three - redo with posterior lateral fusion with pedicle  screws;  Surgeon: Julio Sicks, MD;  Location: [facility] OR;  Service: Neurosurgery;  Laterality: N/A;   NECK SURGERY     POSTERIOR CERVICAL FUSION/FORAMINOTOMY  12/05/2011   Procedure: POSTERIOR CERVICAL FUSION/FORAMINOTOMY LEVEL 4;  Surgeon: Reinaldo Meeker, MD;  Location: [facility];  Service: Neurosurgery;  Laterality: N/A;  Posterior Cervical fixation Cervical four-Seven,Cervical Decompresson Cervical three   THORACIC DISCECTOMY N/A 01/08/2021   Procedure: Thoracic One-Two, Thoracic Two-Three, Thoracic Eleven-Twelve, Thoracic Twelve-Lumbar One Laminectomy;  Surgeon: Julio Sicks, MD;  Location: [facility] OR;  Service: Neurosurgery;  Laterality: N/A;     Current Meds  Medication Sig   aspirin 81 MG chewable tablet Chew 1 tablet (81 mg total) by mouth daily.   benazepril (LOTENSIN) 20 MG tablet Take 1 tablet (20 mg total) by mouth daily.   Cholecalciferol (VITAMIN D3) 1.25 MG (50000 UT) TABS Take 50,000 Units by mouth every Wednesday.   cholecalciferol (VITAMIN D3) 25 MCG (1000 UNIT) tablet Take 1,000 Units by mouth daily.   DULoxetine (CYMBALTA) 60 MG capsule Take 1 capsule (60 mg total) by mouth daily.   levothyroxine (SYNTHROID) 150 MCG tablet TAKE 1 TABLET BY MOUTH DAILY BEFORE BREAKFAST   loperamide (IMODIUM) 2 MG capsule Take 2 mg by mouth as needed for diarrhea or loose stools. One capsule with each loose stool - max of 5 doses in 24 hours.   simvastatin (ZOCOR) 20 MG tablet TAKE 1 TABLET BY MOUTH AT BEDTIME  tamsulosin (FLOMAX) 0.4 MG CAPS capsule Take 1 capsule (0.4 mg total) by mouth in the morning and at bedtime.   triamcinolone cream (KENALOG) 0.1 % Apply 1 Application topically 2 (two) times daily.   trimethoprim (TRIMPEX) 100 MG tablet Take 1 tablet (100 mg total) by mouth at bedtime.   vitamin B-12 (CYANOCOBALAMIN) 1000 MCG tablet Take 1,000 mcg by mouth daily.     Allergies:   Sulfa antibiotics   ROS:   Please see the history of present illness.     All other systems reviewed and are  negative.   Labs/Other Tests and Data Reviewed:    Recent Labs: 07/17/2021: ALT 9 07/20/2021: BUN 19; Creatinine, Ser 1.06; Hemoglobin 8.6; Magnesium 1.9; Platelets 192; Potassium 4.3; Sodium 138 10/11/2021: TSH 0.844   Recent Lipid Panel Lab Results  Component Value Date/Time   CHOL 163 10/11/2021 09:03 AM   TRIG 92 10/11/2021 09:03 AM   HDL 31 (L) 10/11/2021 09:03 AM   CHOLHDL 5.3 (H) 10/11/2021 09:03 AM   CHOLHDL 5.5 01/31/2019 01:36 AM   LDLCALC 115 (H) 10/11/2021 09:03 AM    Wt Readings from Last 3 Encounters:  08/13/21 250 lb (113.4 kg)  07/17/21 238 lb (108 kg)  07/14/21 238 lb 1.6 oz (108 kg)     Objective:    Vital Signs:  There were no vitals taken for this visit.     ASSESSMENT & PLAN:   Bedbug bites Informed the patient that bedbug bites spontaneously resolve and treatment of the bites is not mandatory, however significant pruritus is common and can be treated with low or medium potency topical clinical steroid Will treat with Kenalog cream I recommend over-the-counter antihistamine for severe generalized pruritus   Time:   Today, I have spent 11 minutes reviewing the chart, including problem list, medications, and with the patient with telehealth technology discussing the above problems.   Medication Adjustments/Labs and Tests Ordered: Current medicines are reviewed at length with the patient today.  Concerns regarding medicines are outlined above.   Tests Ordered: No orders of the defined types were placed in this encounter.   Medication Changes: Meds ordered this encounter  Medications   triamcinolone cream (KENALOG) 0.1 %    Sig: Apply 1 Application topically 2 (two) times daily.    Dispense:  30 g    Refill:  2     Note: This dictation was prepared with Dragon dictation along with smaller phrase technology. Similar sounding words can be transcribed inadequately or may not be corrected upon review. Any transcriptional errors that result from  this process are unintentional.      Disposition:  Follow up  Signed, Alvira Monday, FNP  02/04/2022 4:27 PM     [facility]

## 2022-03-03 ENCOUNTER — Other Ambulatory Visit: Payer: Self-pay | Admitting: Family Medicine

## 2022-03-14 ENCOUNTER — Other Ambulatory Visit: Payer: Self-pay | Admitting: Family Medicine

## 2022-03-14 DIAGNOSIS — W57XXXA Bitten or stung by nonvenomous insect and other nonvenomous arthropods, initial encounter: Secondary | ICD-10-CM

## 2022-03-26 ENCOUNTER — Encounter: Payer: Self-pay | Admitting: Family Medicine

## 2022-03-26 ENCOUNTER — Telehealth: Payer: Self-pay | Admitting: Family Medicine

## 2022-03-26 ENCOUNTER — Ambulatory Visit (INDEPENDENT_AMBULATORY_CARE_PROVIDER_SITE_OTHER): Payer: 59 | Admitting: Family Medicine

## 2022-03-26 DIAGNOSIS — M25561 Pain in right knee: Secondary | ICD-10-CM

## 2022-03-26 DIAGNOSIS — G609 Hereditary and idiopathic neuropathy, unspecified: Secondary | ICD-10-CM | POA: Diagnosis not present

## 2022-03-26 DIAGNOSIS — M25562 Pain in left knee: Secondary | ICD-10-CM

## 2022-03-26 DIAGNOSIS — R29898 Other symptoms and signs involving the musculoskeletal system: Secondary | ICD-10-CM

## 2022-03-26 MED ORDER — GABAPENTIN 100 MG PO CAPS: 100.0000 mg | ORAL_CAPSULE | Freq: Every day | ORAL | 3 refills | Status: DC

## 2022-03-26 NOTE — Telephone Encounter (Signed)
Phone call returned

## 2022-03-26 NOTE — Telephone Encounter (Signed)
Patient returning a call. Not sure who called.

## 2022-03-26 NOTE — Progress Notes (Signed)
l    Virtual Visit via Telephone Note   This visit type was conducted via telephone. This format is felt to be most appropriate for this patient at this time.  The patient did not have access to video technology/had technical difficulties with video requiring transitioning to audio format only (telephone).  All issues noted in this document were discussed and addressed.  No physical exam could be performed with this format.  Evaluation Performed:  Follow-up visit  Date:  03/26/2022   ID:  [name], [name] [birth year], MRN [id number]  Patient Location: Home Provider Location: Clinic  Participants: Patient Location of Patient: Home Location of Provider:Clinic Consent was obtain for visit to be over via telehealth. I verified that I am speaking with the correct person using two identifiers.  PCP:  Gilmore Laroche, FNP   Chief Complaint: Lower extremity weakness  History of Present Illness:    [name] is a 76 y.o. male with history of peripheral neuropathy complains of numbness and lower extremity weakness.  He reports an inability to walk 6 feet without falling. He reports frequent falls this year due to weakness in his lower extremities.  He  noted that his lower extremity weakness is more prominent in his right leg, noting his inability to stand upright.  Given his frequent falls, the patient will like imaging studies of his knees before starting physical therapy.   The patient does not have symptoms concerning for COVID-19 infection (fever, chills, cough, or new shortness of breath).   Past Medical, Surgical, Social History, Allergies, and Medications have been Reviewed.  Past Medical History:  Diagnosis Date   Arthritis    Bronchitis    hx of   COPD (chronic obstructive pulmonary disease) (HCC)    GERD (gastroesophageal reflux disease)    History of kidney stones    Hypertension    Hypothyroidism    Macular degeneration    right eye   Restless leg  syndrome    Past Surgical History:  Procedure Laterality Date   BACK SURGERY     Fusion 2005   Graves Disease     JOINT REPLACEMENT  1970   Right knee    LAMINECTOMY WITH POSTERIOR LATERAL ARTHRODESIS LEVEL 2 N/A 06/29/2021   Procedure: Laminectomy - Lumbar one-two, Lumbar two-three - redo with posterior lateral fusion with pedicle screws;  Surgeon: Julio Sicks, MD;  Location: [facility] OR;  Service: Neurosurgery;  Laterality: N/A;   NECK SURGERY     POSTERIOR CERVICAL FUSION/FORAMINOTOMY  12/05/2011   Procedure: POSTERIOR CERVICAL FUSION/FORAMINOTOMY LEVEL 4;  Surgeon: Reinaldo Meeker, MD;  Location: [facility];  Service: Neurosurgery;  Laterality: N/A;  Posterior Cervical fixation Cervical four-Seven,Cervical Decompresson Cervical three   THORACIC DISCECTOMY N/A 01/08/2021   Procedure: Thoracic One-Two, Thoracic Two-Three, Thoracic Eleven-Twelve, Thoracic Twelve-Lumbar One Laminectomy;  Surgeon: Julio Sicks, MD;  Location: [facility] OR;  Service: Neurosurgery;  Laterality: N/A;     Current Meds  Medication Sig   aspirin 81 MG chewable tablet Chew 1 tablet (81 mg total) by mouth daily.   benazepril (LOTENSIN) 20 MG tablet Take 1 tablet (20 mg total) by mouth daily.   Cholecalciferol (VITAMIN D3) 1.25 MG (50000 UT) TABS Take 50,000 Units by mouth every Wednesday.   cholecalciferol (VITAMIN D3) 25 MCG (1000 UNIT) tablet Take 1,000 Units by mouth daily.   DULoxetine (CYMBALTA) 60 MG capsule TAKE 1 CAPSULE BY MOUTH DAILY   gabapentin (NEURONTIN) 100 MG capsule Take 1 capsule (100 mg total)  by mouth at bedtime.   levothyroxine (SYNTHROID) 150 MCG tablet TAKE 1 TABLET BY MOUTH DAILY BEFORE BREAKFAST   loperamide (IMODIUM) 2 MG capsule Take 2 mg by mouth as needed for diarrhea or loose stools. One capsule with each loose stool - max of 5 doses in 24 hours.   simvastatin (ZOCOR) 20 MG tablet TAKE 1 TABLET BY MOUTH AT BEDTIME   tamsulosin (FLOMAX) 0.4 MG CAPS capsule Take 1 capsule (0.4 mg total) by mouth in  the morning and at bedtime.   triamcinolone cream (KENALOG) 0.1 % APPLY TO THE AFFECTED AREA(S) TWICE DAILY   trimethoprim (TRIMPEX) 100 MG tablet Take 1 tablet (100 mg total) by mouth at bedtime.   vitamin B-12 (CYANOCOBALAMIN) 1000 MCG tablet Take 1,000 mcg by mouth daily.     Allergies:   Sulfa antibiotics   ROS:   Please see the history of present illness.     All other systems reviewed and are negative.   Labs/Other Tests and Data Reviewed:    Recent Labs: 07/17/2021: ALT 9 07/20/2021: BUN 19; Creatinine, Ser 1.06; Hemoglobin 8.6; Magnesium 1.9; Platelets 192; Potassium 4.3; Sodium 138 10/11/2021: TSH 0.844   Recent Lipid Panel Lab Results  Component Value Date/Time   CHOL 163 10/11/2021 09:03 AM   TRIG 92 10/11/2021 09:03 AM   HDL 31 (L) 10/11/2021 09:03 AM   CHOLHDL 5.3 (H) 10/11/2021 09:03 AM   CHOLHDL 5.5 01/31/2019 01:36 AM   LDLCALC 115 (H) 10/11/2021 09:03 AM    Wt Readings from Last 3 Encounters:  08/13/21 250 lb (113.4 kg)  07/17/21 238 lb (108 kg)  07/14/21 238 lb 1.6 oz (108 kg)     Objective:    Vital Signs:  There were no vitals taken for this visit.     ASSESSMENT & PLAN:   Weakness of both lower extremities Referral placed to neurology to rule out Guillain-Barr syndrome Referral placed to physical therapy X-ray of the knees bilaterally is ordered Gabapentin 100 mg nightly is prescribed for peripheral neuropathy  Time:   Today, I have spent 15 minutes reviewing the chart, including problem list, medications, and with the patient with telehealth technology discussing the above problems.   Medication Adjustments/Labs and Tests Ordered: Current medicines are reviewed at length with the patient today.  Concerns regarding medicines are outlined above.   Tests Ordered: Orders Placed This Encounter  Procedures   DG Knee 1-2 Views Right   DG Knee 1-2 Views Left   Ambulatory referral to Neurology   Ambulatory referral to Physical Therapy     Medication Changes: Meds ordered this encounter  Medications   gabapentin (NEURONTIN) 100 MG capsule    Sig: Take 1 capsule (100 mg total) by mouth at bedtime.    Dispense:  90 capsule    Refill:  3     Note: This dictation was prepared with Dragon dictation along with smaller phrase technology. Similar sounding words can be transcribed inadequately or may not be corrected upon review. Any transcriptional errors that result from this process are unintentional.      Disposition:  Follow up  Signed, Gilmore Laroche, FNP  03/26/2022 10:16 PM     [facility]

## 2022-04-03 ENCOUNTER — Other Ambulatory Visit: Payer: Self-pay | Admitting: Family Medicine

## 2022-04-03 ENCOUNTER — Telehealth: Payer: Self-pay | Admitting: Family Medicine

## 2022-04-03 DIAGNOSIS — I10 Essential (primary) hypertension: Secondary | ICD-10-CM

## 2022-04-03 NOTE — Telephone Encounter (Signed)
Patient spouse called needs for nurse to call patient back something is going on .

## 2022-04-04 ENCOUNTER — Other Ambulatory Visit: Payer: Self-pay | Admitting: Family Medicine

## 2022-04-04 DIAGNOSIS — W57XXXA Bitten or stung by nonvenomous insect and other nonvenomous arthropods, initial encounter: Secondary | ICD-10-CM

## 2022-04-04 MED ORDER — TRIAMCINOLONE ACETONIDE 0.1 % EX CREA: TOPICAL_CREAM | Freq: Two times a day (BID) | CUTANEOUS | 2 refills | Status: DC

## 2022-04-04 NOTE — Telephone Encounter (Signed)
Please inform the patient that a prescription of topical corticosteroid has been sent to relieve symptoms of pruritus.  Bedbug bites spontaneously resolve, and treatment of the bites is not mandatory.

## 2022-04-04 NOTE — Telephone Encounter (Signed)
Called and left VM

## 2022-04-04 NOTE — Telephone Encounter (Signed)
Spoke with patient.

## 2022-04-29 ENCOUNTER — Telehealth: Payer: Self-pay | Admitting: Family Medicine

## 2022-04-29 ENCOUNTER — Ambulatory Visit (HOSPITAL_COMMUNITY): Payer: Medicaid Other | Admitting: Physical Therapy

## 2022-04-29 NOTE — Telephone Encounter (Signed)
Spouse called in on patient behalf.  Patient is scheduled for Physical Therapy on 1/16 but unable to be seen due to transportation on EMS.  Spouse wants a call back in regard to see what other options are

## 2022-04-29 NOTE — Telephone Encounter (Signed)
Pt states he will not be able to make it to physical therapy, he doesn't have much movement, and the only way to be transported Is on a stretcher , has had multiple falls where he has no feeling in his legs. Pt is needing physical therapy at home, he would like to have this set for him, please advice?

## 2022-04-30 ENCOUNTER — Other Ambulatory Visit: Payer: Self-pay

## 2022-04-30 ENCOUNTER — Ambulatory Visit (HOSPITAL_COMMUNITY): Payer: Medicaid Other | Admitting: Physical Therapy

## 2022-04-30 NOTE — Telephone Encounter (Signed)
Spoke to pt he agreed to an appt on 05/22/22, pt asking for ems to pick him up for appt since he can only come in on bed stretcher, said someone at the front desk told him we arrange this for them, I'm not sure how this works?

## 2022-04-30 NOTE — Telephone Encounter (Signed)
Please inform the patient he will have to schedule an office visit to be seen in the clinic for a referral to be placed to home health to receive physical therapy at home.

## 2022-05-06 NOTE — Telephone Encounter (Signed)
Spoke to pt provided them with information needed to have this arranged before the office visit date.

## 2022-05-06 NOTE — Telephone Encounter (Signed)
Kindly forward this message to [employer] as I am unsure of this process

## 2022-05-08 ENCOUNTER — Telehealth: Payer: Self-pay | Admitting: Neurology

## 2022-05-08 ENCOUNTER — Encounter: Payer: Self-pay | Admitting: Neurology

## 2022-05-08 ENCOUNTER — Ambulatory Visit (INDEPENDENT_AMBULATORY_CARE_PROVIDER_SITE_OTHER): Payer: 59 | Admitting: Neurology

## 2022-05-08 VITALS — BP 119/68 | HR 100 | Ht 79.0 in

## 2022-05-08 DIAGNOSIS — R269 Unspecified abnormalities of gait and mobility: Secondary | ICD-10-CM | POA: Diagnosis not present

## 2022-05-08 DIAGNOSIS — R200 Anesthesia of skin: Secondary | ICD-10-CM

## 2022-05-08 DIAGNOSIS — G959 Disease of spinal cord, unspecified: Secondary | ICD-10-CM
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
## 2022-05-08 NOTE — Telephone Encounter (Signed)
[employer] medicare/[place] medicaid NPR sent to [facility] [phone] 

## 2022-05-11 ENCOUNTER — Other Ambulatory Visit: Payer: Self-pay | Admitting: Family Medicine

## 2022-05-11 DIAGNOSIS — W57XXXA Bitten or stung by nonvenomous insect and other nonvenomous arthropods, initial encounter: Secondary | ICD-10-CM

## 2022-05-13 ENCOUNTER — Telehealth: Payer: Self-pay | Admitting: *Deleted

## 2022-05-13 LAB — MULTIPLE MYELOMA PANEL, SERUM
Albumin SerPl Elph-Mcnc: 3.6 g/dL (ref 2.9–4.4)
Albumin/Glob SerPl: 1.3 (ref 0.7–1.7)
Alpha 1: 0.3 g/dL (ref 0.0–0.4)
Alpha2 Glob SerPl Elph-Mcnc: 0.7 g/dL (ref 0.4–1.0)
B-Globulin SerPl Elph-Mcnc: 1 g/dL (ref 0.7–1.3)
Gamma Glob SerPl Elph-Mcnc: 0.9 g/dL (ref 0.4–1.8)
Globulin, Total: 2.9 g/dL (ref 2.2–3.9)
IgA/Immunoglobulin A, Serum: 256 mg/dL (ref 61–437)
IgG (Immunoglobin G), Serum: 943 mg/dL (ref 603–1613)
IgM (Immunoglobulin M), Srm: 28 mg/dL (ref 15–143)

## 2022-05-13 LAB — COMPREHENSIVE METABOLIC PANEL
ALT: 7 IU/L (ref 0–44)
AST: 11 IU/L (ref 0–40)
Albumin/Globulin Ratio: 1.8 (ref 1.2–2.2)
Albumin: 4.2 g/dL (ref 3.8–4.8)
Alkaline Phosphatase: 118 IU/L (ref 44–121)
BUN/Creatinine Ratio: 15 (ref 10–24)
BUN: 19 mg/dL (ref 8–27)
Bilirubin Total: 0.2 mg/dL (ref 0.0–1.2)
CO2: 22 mmol/L (ref 20–29)
Calcium: 9.3 mg/dL (ref 8.6–10.2)
Chloride: 103 mmol/L (ref 96–106)
Creatinine, Ser: 1.24 mg/dL (ref 0.76–1.27)
Globulin, Total: 2.3 g/dL (ref 1.5–4.5)
Glucose: 88 mg/dL (ref 70–99)
Potassium: 5.8 mmol/L — ABNORMAL HIGH (ref 3.5–5.2)
Sodium: 136 mmol/L (ref 134–144)
Total Protein: 6.5 g/dL (ref 6.0–8.5)
eGFR: 61 mL/min/{1.73_m2} (ref >59)

## 2022-05-13 LAB — VITAMIN B12: Vitamin B-12: 596 pg/mL (ref 232–1245)

## 2022-05-13 NOTE — Telephone Encounter (Signed)
Left message for patient to call.

## 2022-05-13 NOTE — Telephone Encounter (Signed)
-----  Message from Britt Bottom, MD sent at 05/13/2022  4:49 PM EST ----- Please let him know that the lab work was normal.  We will let them know the results of the MRIs after they are done

## 2022-05-14 ENCOUNTER — Telehealth: Payer: Self-pay

## 2022-05-14 NOTE — Telephone Encounter (Signed)
Called Pt and left him a message to call back for his lab results.

## 2022-05-14 NOTE — Telephone Encounter (Signed)
Per Dr. Felecia Shelling: "Please let him know that the lab work was normal.  We will let them know the results of the MRIs after they are done "   Pt called back and phone staff relayed results. Pt verbalized understanding.

## 2022-05-14 NOTE — Telephone Encounter (Signed)
Pt's wife returned phone call for results.  Nurse ask phone room rep to relay results. Ms. Balingit verbalized understand.

## 2022-05-22 ENCOUNTER — Ambulatory Visit: Payer: Medicaid Other | Admitting: Family Medicine

## 2022-05-28 ENCOUNTER — Ambulatory Visit
Admission: RE | Admit: 2022-05-28 | Discharge: 2022-05-28 | Disposition: A | Discharge status: Home / Self Care | Payer: Medicaid Other | Source: Ambulatory Visit | Attending: Neurology | Admitting: Neurology

## 2022-05-28 ENCOUNTER — Other Ambulatory Visit: Payer: Medicaid Other

## 2022-05-28 DIAGNOSIS — R269 Unspecified abnormalities of gait and mobility: Secondary | ICD-10-CM

## 2022-05-28 DIAGNOSIS — G959 Disease of spinal cord, unspecified: Secondary | ICD-10-CM | POA: Diagnosis not present

## 2022-05-28 DIAGNOSIS — R200 Anesthesia of skin: Secondary | ICD-10-CM

## 2022-05-30 ENCOUNTER — Other Ambulatory Visit: Payer: Medicaid Other

## 2022-05-31 ENCOUNTER — Other Ambulatory Visit: Payer: Self-pay | Admitting: Family Medicine

## 2022-05-31 ENCOUNTER — Telehealth: Payer: Self-pay | Admitting: Family Medicine

## 2022-05-31 NOTE — Telephone Encounter (Signed)
Patient spouse called asked if nurse would return call about test results before weekend comes.  Call back # [phone]

## 2022-05-31 NOTE — Telephone Encounter (Signed)
Encourage to f/u with neurology as the test was ordered by his neurologist

## 2022-06-03 NOTE — Telephone Encounter (Signed)
Patient advised.

## 2022-06-06 ENCOUNTER — Ambulatory Visit (INDEPENDENT_AMBULATORY_CARE_PROVIDER_SITE_OTHER): Payer: 59 | Admitting: Family Medicine

## 2022-06-06 ENCOUNTER — Encounter: Payer: Self-pay | Admitting: Family Medicine

## 2022-06-06 VITALS — BP 140/72 | HR 67 | Ht 79.0 in | Wt 241.0 lb

## 2022-06-06 DIAGNOSIS — M48062 Spinal stenosis, lumbar region with neurogenic claudication: Secondary | ICD-10-CM

## 2022-06-06 DIAGNOSIS — E559 Vitamin D deficiency, unspecified: Secondary | ICD-10-CM

## 2022-06-06 DIAGNOSIS — E0789 Other specified disorders of thyroid: Secondary | ICD-10-CM | POA: Diagnosis not present

## 2022-06-06 DIAGNOSIS — R7301 Impaired fasting glucose: Secondary | ICD-10-CM

## 2022-06-06 DIAGNOSIS — M4804 Spinal stenosis, thoracic region: Secondary | ICD-10-CM

## 2022-06-06 DIAGNOSIS — G992 Myelopathy in diseases classified elsewhere: Secondary | ICD-10-CM

## 2022-06-06 DIAGNOSIS — I10 Essential (primary) hypertension: Secondary | ICD-10-CM | POA: Diagnosis not present

## 2022-06-06 DIAGNOSIS — G609 Hereditary and idiopathic neuropathy, unspecified: Secondary | ICD-10-CM

## 2022-06-06 DIAGNOSIS — E7849 Other hyperlipidemia: Secondary | ICD-10-CM

## 2022-06-06 MED ORDER — GABAPENTIN 300 MG PO CAPS: 300.0000 mg | ORAL_CAPSULE | Freq: Three times a day (TID) | ORAL | 3 refills | Status: DC

## 2022-06-06 NOTE — Patient Instructions (Addendum)
I appreciate the opportunity to provide care to you today!    Follow up:  3 months  Labs: please stop by the lab today to get your blood drawn (CBC, TSH, Lipid profile, HgA1c, Vit D)  Please start taking Gabapentin 300 mg TID as needed for peripheral neuropathy     Ref: home health   Please continue to a heart-healthy diet and increase your physical activities. Try to exercise for 58mns at least five times a week.      It was a pleasure to see you and I look forward to continuing to work together on your health and well-being. Please do not hesitate to call the office if you need care or have questions about your care.   Have a wonderful day and week. With Gratitude, GAlvira MondayMSN, FNP-BC

## 2022-06-06 NOTE — Assessment & Plan Note (Signed)
Complains of numbness and tingling in the lower extremity Complains of pin and needle sensation Symptom is constant Reports minimal relief of her symptoms with gabapentin 100 mg nightly Will discontinue gabapentin 100 mg and start patient on gabapentin 300 mg to take 3 times daily Home health referral was placed

## 2022-06-06 NOTE — Progress Notes (Signed)
Established Patient Office Visit  Subjective:  Patient ID: [name], male    DOB: July 12, 1945  Age: 77 y.o. MRN: [id number]  CC:  Chief Complaint  Patient presents with   Follow-up    Needs orders for home health care, and home health services per insurance.     HPI [name] is a 77 y.o. male with past medical history of hypertension, COPD, and hypothyroidism presents for f/u of  chronic medical conditions. For the details of today's visit, please refer to the assessment and plan.     Past Medical History:  Diagnosis Date   Arthritis    Bronchitis    hx of   COPD (chronic obstructive pulmonary disease) (HCC)    GERD (gastroesophageal reflux disease)    History of kidney stones    Hypertension    Hypothyroidism    Macular degeneration    right eye   Restless leg syndrome     Past Surgical History:  Procedure Laterality Date   BACK SURGERY     Fusion 2005   Graves Disease     JOINT REPLACEMENT  1970   Right knee    LAMINECTOMY WITH POSTERIOR LATERAL ARTHRODESIS LEVEL 2 N/A 06/29/2021   Procedure: Laminectomy - Lumbar one-two, Lumbar two-three - redo with posterior lateral fusion with pedicle screws;  Surgeon: Earnie Larsson, MD;  Location: [place];  Service: Neurosurgery;  Laterality: N/A;   NECK SURGERY     POSTERIOR CERVICAL FUSION/FORAMINOTOMY  12/05/2011   Procedure: POSTERIOR CERVICAL FUSION/FORAMINOTOMY LEVEL 4;  Surgeon: Faythe Ghee, MD;  Location: [facility];  Service: Neurosurgery;  Laterality: N/A;  Posterior Cervical fixation Cervical four-Seven,Cervical Decompresson Cervical three   THORACIC DISCECTOMY N/A 01/08/2021   Procedure: Thoracic One-Two, Thoracic Two-Three, Thoracic Eleven-Twelve, Thoracic Twelve-Lumbar One Laminectomy;  Surgeon: Earnie Larsson, MD;  Location: [place];  Service: Neurosurgery;  Laterality: N/A;    Family History  Problem Relation Age of Onset   Heart attack Mother 27   Heart disease Father     Social History    Socioeconomic History   Marital status: Married    Spouse name: [name]   Number of children: 1   Years of education: 12   Highest education level: Not on file  Occupational History   Not on file  Tobacco Use   Smoking status: Some Days    Packs/day: 0.25    Years: 50.00    Total pack years: 12.50    Types: Cigarettes   Smokeless tobacco: Never  Vaping Use   Vaping Use: Never used  Substance and Sexual Activity   Alcohol use: No   Drug use: Yes   Sexual activity: Not on file  Other Topics Concern   Not on file  Social History Narrative   Right handed   Lives with wife   No caffeine use   Social Determinants of Health   Financial Resource Strain: Low Risk  (09/18/2021)   Overall Financial Resource Strain (CARDIA)    Difficulty of Paying Living Expenses: Not hard at all  Food Insecurity: No Food Insecurity (09/18/2021)   Hunger Vital Sign    Worried About Running Out of Food in the Last Year: Never true    Ran Out of Food in the Last Year: Never true  Transportation Needs: No Transportation Needs (09/18/2021)   PRAPARE - Hydrologist (Medical): No    Lack of Transportation (Non-Medical): No  Physical Activity: Insufficiently Active (  09/18/2021)   Exercise Vital Sign    Days of Exercise per Week: 5 days    Minutes of Exercise per Session: 20 min  Stress: No Stress Concern Present (09/18/2021)   [place]    Feeling of Stress : Not at all  Social Connections: Moderately Integrated (09/18/2021)   Social Connection and Isolation Panel [NHANES]    Frequency of Communication with Friends and Family: More than three times a week    Frequency of Social Gatherings with Friends and Family: More than three times a week    Attends Religious Services: More than 4 times per year    Active Member of [employer] or Organizations: No    Attends Archivist Meetings: Never     Marital Status: Married  Human resources officer Violence: Not At Risk (09/18/2021)   Humiliation, Afraid, Rape, and Kick questionnaire    Fear of Current or Ex-Partner: No    Emotionally Abused: No    Physically Abused: No    Sexually Abused: No    Outpatient Medications Prior to Visit  Medication Sig Dispense Refill   aspirin 81 MG chewable tablet Chew 1 tablet (81 mg total) by mouth daily. 90 tablet 0   benazepril (LOTENSIN) 20 MG tablet Take 1 tablet (20 mg total) by mouth daily. 90 tablet 3   Cholecalciferol (VITAMIN D3) 1.25 MG (50000 UT) TABS Take 50,000 Units by mouth every Wednesday.     DULoxetine (CYMBALTA) 60 MG capsule TAKE 1 CAPSULE BY MOUTH DAILY 90 capsule 0   levothyroxine (SYNTHROID) 150 MCG tablet TAKE 1 TABLET BY MOUTH DAILY BEFORE BREAKFAST 90 tablet 2   loperamide (IMODIUM) 2 MG capsule Take 2 mg by mouth as needed for diarrhea or loose stools. One capsule with each loose stool - max of 5 doses in 24 hours.     simvastatin (ZOCOR) 20 MG tablet TAKE 1 TABLET BY MOUTH AT BEDTIME 90 tablet 0   tamsulosin (FLOMAX) 0.4 MG CAPS capsule Take 1 capsule (0.4 mg total) by mouth in the morning and at bedtime. 60 capsule 11   triamcinolone cream (KENALOG) 0.1 % Apply topically 2 (two) times daily. to affected area(s) 30 g 2   trimethoprim (TRIMPEX) 100 MG tablet Take 1 tablet (100 mg total) by mouth at bedtime. 30 tablet 11   UNABLE TO FIND Med Name: preserve vision (areds 2)     vitamin B-12 (CYANOCOBALAMIN) 1000 MCG tablet Take 1,000 mcg by mouth daily.     gabapentin (NEURONTIN) 100 MG capsule Take 1 capsule (100 mg total) by mouth at bedtime. 90 capsule 3   cholecalciferol (VITAMIN D3) 25 MCG (1000 UNIT) tablet Take 1,000 Units by mouth daily.     No facility-administered medications prior to visit.    Allergies  Allergen Reactions   Sulfa Antibiotics Other (See Comments)    Causes blisters and skin to peel from inside mouth    ROS Review of Systems  Constitutional:   Negative for fatigue and fever.  Eyes:  Negative for visual disturbance.  Respiratory:  Negative for chest tightness and shortness of breath.   Cardiovascular:  Negative for chest pain and palpitations.  Neurological:  Positive for numbness. Negative for dizziness and headaches.      Objective:    Physical Exam HENT:     Head: Normocephalic.     Right Ear: External ear normal.     Left Ear: External ear normal.     Nose: No  congestion or rhinorrhea.     Mouth/Throat:     Mouth: Mucous membranes are moist.  Cardiovascular:     Rate and Rhythm: Regular rhythm.     Heart sounds: No murmur heard. Pulmonary:     Effort: No respiratory distress.     Breath sounds: Normal breath sounds.  Neurological:     Mental Status: He is alert.     BP (!) 140/72 (BP Location: Left Arm)   Pulse 67   Ht 6' 7"$  (2.007 m)   Wt 241 lb (109.3 kg) Comment: pt reported  SpO2 96%   BMI 27.15 kg/m  Wt Readings from Last 3 Encounters:  06/06/22 241 lb (109.3 kg)  08/13/21 250 lb (113.4 kg)  07/17/21 238 lb (108 kg)    Lab Results  Component Value Date   TSH 0.844 10/11/2021   Lab Results  Component Value Date   WBC 6.0 07/20/2021   HGB 8.6 (L) 07/20/2021   HCT 27.3 (L) 07/20/2021   MCV 95.8 07/20/2021   PLT 192 07/20/2021   Lab Results  Component Value Date   NA 136 05/08/2022   K 5.8 (H) 05/08/2022   CO2 22 05/08/2022   GLUCOSE 88 05/08/2022   BUN 19 05/08/2022   CREATININE 1.24 05/08/2022   BILITOT 0.2 05/08/2022   ALKPHOS 118 05/08/2022   AST 11 05/08/2022   ALT 7 05/08/2022   PROT 6.5 05/08/2022   ALBUMIN 4.2 05/08/2022   CALCIUM 9.3 05/08/2022   ANIONGAP 6 07/20/2021   EGFR 61 05/08/2022   Lab Results  Component Value Date   CHOL 163 10/11/2021   Lab Results  Component Value Date   HDL 31 (L) 10/11/2021   Lab Results  Component Value Date   LDLCALC 115 (H) 10/11/2021   Lab Results  Component Value Date   TRIG 92 10/11/2021   Lab Results  Component Value  Date   CHOLHDL 5.3 (H) 10/11/2021   Lab Results  Component Value Date   HGBA1C 5.0 10/11/2021      Assessment & Plan:  Idiopathic peripheral neuropathy Assessment & Plan: Complains of numbness and tingling in the lower extremity Complains of pin and needle sensation Symptom is constant Reports minimal relief of her symptoms with gabapentin 100 mg nightly Will discontinue gabapentin 100 mg and start patient on gabapentin 300 mg to take 3 times daily Home health referral was placed  Orders: -     Gabapentin; Take 1 capsule (300 mg total) by mouth 3 (three) times daily.  Dispense: 90 capsule; Refill: 3  Hypertension, unspecified type -     CBC with Differential/Platelet  Vitamin D deficiency -     VITAMIN D 25 Hydroxy (Vit-D Deficiency, Fractures)  Other specified disorders of thyroid -     TSH + free T4  Impaired fasting blood sugar -     Hemoglobin A1c  Other hyperlipidemia -     Lipid panel  Lumbar stenosis with neurogenic claudication -     Ambulatory referral to Home Health  Myelopathy concurrent with and due to spinal stenosis of thoracic region [facility]) -     Ambulatory referral to Home Health    Follow-up: Return in about 3 months (around 09/04/2022).   Alvira Monday, FNP

## 2022-06-10 ENCOUNTER — Telehealth: Payer: Self-pay | Admitting: Family Medicine

## 2022-06-10 NOTE — Telephone Encounter (Signed)
Patient spouse called needs nurse to return her call has questions regarding several of his medications. Please return patient call.

## 2022-06-11 ENCOUNTER — Other Ambulatory Visit: Payer: Self-pay

## 2022-06-11 ENCOUNTER — Telehealth: Payer: Self-pay | Admitting: Family Medicine

## 2022-06-11 NOTE — Telephone Encounter (Signed)
Patient spouse called and has the [facility] fax number [phone]

## 2022-06-11 NOTE — Telephone Encounter (Signed)
Lab orders faxed.

## 2022-06-11 NOTE — Telephone Encounter (Signed)
Returned call pt stated she would call me back with home health information to fax over lab order.

## 2022-06-13 ENCOUNTER — Telehealth: Payer: Self-pay | Admitting: Family Medicine

## 2022-06-13 NOTE — Telephone Encounter (Signed)
[facility]par [facility], [phone]  Wants to know if the referral for home health services can be held till 06/18/22. Do you approve for this?

## 2022-06-13 NOTE — Telephone Encounter (Signed)
sure 

## 2022-06-13 NOTE — Telephone Encounter (Signed)
Called [facility] and let them know Peter [place] approves.

## 2022-06-14 ENCOUNTER — Other Ambulatory Visit: Payer: Self-pay | Admitting: Family Medicine

## 2022-06-14 ENCOUNTER — Telehealth: Payer: Self-pay | Admitting: Family Medicine

## 2022-06-14 DIAGNOSIS — E559 Vitamin D deficiency, unspecified: Secondary | ICD-10-CM

## 2022-06-14 NOTE — Telephone Encounter (Signed)
[facility], [phone]   Wants to know if patient is supposed to be on Vit D 50,000u? If so, they are needing a new RX.

## 2022-06-14 NOTE — Telephone Encounter (Signed)
His vitamin D was within normal limits on 10/11/21. He only needs to continue therapy once I assess his vitamin D levels.

## 2022-06-14 NOTE — Telephone Encounter (Signed)
Spoke to pharmacy let them know pt needs to come to have labs first.

## 2022-06-18 ENCOUNTER — Telehealth: Payer: Self-pay | Admitting: Family Medicine

## 2022-06-18 NOTE — Telephone Encounter (Signed)
Pt wife needs speak to nurse in regards to several questions about pt health.     [phone]

## 2022-06-18 NOTE — Telephone Encounter (Signed)
[name] [facility], [place] stating pt has refused PT services at this time. He wants Dr. To tell him what is wrong with his rt leg before he does therapy.

## 2022-06-18 NOTE — Telephone Encounter (Signed)
Spoke to catherine (pts wife) she would like to have imaging first before he proceeds to doing physical therapy. Pt would like imaging done on lower extremity since that is where all his weakness. Scared to do PT because he feels so weak.

## 2022-06-24 NOTE — Telephone Encounter (Signed)
Spoke to pt let him know he verbalized understanding.

## 2022-06-24 NOTE — Telephone Encounter (Signed)
Please have patient follow up with neurology concerning imaging studies

## 2022-06-28 ENCOUNTER — Telehealth: Payer: Self-pay | Admitting: Family Medicine

## 2022-06-28 NOTE — Telephone Encounter (Signed)
Pt turned down home health, his wife will check on when she can bring him by to have labs done to see what vitamin d treatment he should be on.

## 2022-06-28 NOTE — Telephone Encounter (Signed)
Patient spouse called says his vitamin D3 is mixed up with other medications. Please return call to [phone].

## 2022-07-02 ENCOUNTER — Other Ambulatory Visit: Payer: Self-pay | Admitting: Family Medicine

## 2022-07-02 DIAGNOSIS — I10 Essential (primary) hypertension: Secondary | ICD-10-CM

## 2022-07-03 ENCOUNTER — Telehealth: Payer: Self-pay | Admitting: Family Medicine

## 2022-07-03 NOTE — Telephone Encounter (Signed)
[name], can you please help with this request? Thank you

## 2022-07-03 NOTE — Telephone Encounter (Signed)
FYI, Patient spouse called said the provider in [place] with some some test results by fax to [place] about his vitamins.

## 2022-07-03 NOTE — Telephone Encounter (Signed)
Pt's wife called wanting to know if the pt's lab results can be faxed over to Dr. Louanna Raw, [place] Please advise.

## 2022-07-04 NOTE — Telephone Encounter (Signed)
Noted will be on the look out.

## 2022-07-09 ENCOUNTER — Ambulatory Visit (INDEPENDENT_AMBULATORY_CARE_PROVIDER_SITE_OTHER): Payer: 59 | Admitting: Family Medicine

## 2022-07-09 ENCOUNTER — Encounter: Payer: Self-pay | Admitting: Family Medicine

## 2022-07-09 VITALS — BP 114/67 | HR 94 | Ht 79.0 in | Wt 241.0 lb

## 2022-07-09 DIAGNOSIS — E7849 Other hyperlipidemia: Secondary | ICD-10-CM

## 2022-07-09 DIAGNOSIS — A049 Bacterial intestinal infection, unspecified: Secondary | ICD-10-CM

## 2022-07-09 DIAGNOSIS — N3000 Acute cystitis without hematuria: Secondary | ICD-10-CM | POA: Diagnosis not present

## 2022-07-09 DIAGNOSIS — R399 Unspecified symptoms and signs involving the genitourinary system: Secondary | ICD-10-CM

## 2022-07-09 DIAGNOSIS — E0789 Other specified disorders of thyroid: Secondary | ICD-10-CM

## 2022-07-09 DIAGNOSIS — R29898 Other symptoms and signs involving the musculoskeletal system: Secondary | ICD-10-CM

## 2022-07-09 DIAGNOSIS — R197 Diarrhea, unspecified: Secondary | ICD-10-CM

## 2022-07-09 DIAGNOSIS — R7301 Impaired fasting glucose: Secondary | ICD-10-CM

## 2022-07-09 DIAGNOSIS — E559 Vitamin D deficiency, unspecified: Secondary | ICD-10-CM

## 2022-07-09 LAB — POCT URINALYSIS DIP (CLINITEK)
Bilirubin, UA: NEGATIVE
Glucose, UA: NEGATIVE mg/dL
Ketones, POC UA: NEGATIVE mg/dL
Nitrite, UA: POSITIVE — AB
Spec Grav, UA: 1.015 (ref 1.010–1.025)
Urobilinogen, UA: 0.2 E.U./dL
pH, UA: 7 (ref 5.0–8.0)

## 2022-07-09 MED ORDER — NITROFURANTOIN MONOHYD MACRO 100 MG PO CAPS: 100.0000 mg | ORAL_CAPSULE | Freq: Two times a day (BID) | ORAL | 0 refills | Status: AC

## 2022-07-09 MED ORDER — CIPROFLOXACIN HCL 500 MG PO TABS: 500.0000 mg | ORAL_TABLET | Freq: Two times a day (BID) | ORAL | 0 refills | Status: AC

## 2022-07-09 NOTE — Progress Notes (Unsigned)
Acute Office Visit  Subjective:    Patient ID: [name], male    DOB: 01/15/1946, 77 y.o.   MRN: [id number]  Chief Complaint  Patient presents with   Diarrhea    Pt reports diarrhea on and off for 2.5 weeks. (06/20/22)   Extremity Weakness    Pt reports legs and feet weakness, unable to stand.    HPI Patient is in today with complaints of diarrhea, extreme lower extremity weakness and urinary symptoms.  Diarrhea: off and on for about 2 weeks.  No pus, mucus, or blood in the stool.  Reports having 2-3 loose bowel movements in 24 hours.  No fever or chills were reported.  No recent antibiotic use.  No ingestion of uncooked meals.  No nausea or vomiting reported. of note, the wife reports having viral gastritis a few weeks prior.  Urinary symptoms: The patient reports increased urination, noting that his urine has a foul odor and has been very concentrated.  No fever or chills reported.  No abdominal or back pain was reported.  Extreme weakness: Reports lower extremity weakness that has worsened.  He notes being unable to stand due to lower extremity weakness.  A referral was placed for physical therapy however the patient mentions not being able to attend due to increased weakness in his lower extremities and inability to ambulate and stand erect.  He last followed up with neurology on 05/08/2022 who suspected that his worsening gait was due to deconditioning after his last operation.     Past Medical History:  Diagnosis Date   Arthritis    Bronchitis    hx of   COPD (chronic obstructive pulmonary disease) (HCC)    GERD (gastroesophageal reflux disease)    History of kidney stones    Hypertension    Hypothyroidism    Macular degeneration    right eye   Restless leg syndrome     Past Surgical History:  Procedure Laterality Date   BACK SURGERY     Fusion 2005   Graves Disease     JOINT REPLACEMENT  1970   Right knee    LAMINECTOMY WITH POSTERIOR LATERAL  ARTHRODESIS LEVEL 2 N/A 06/29/2021   Procedure: Laminectomy - Lumbar one-two, Lumbar two-three - redo with posterior lateral fusion with pedicle screws;  Surgeon: Earnie Larsson, MD;  Location: [place];  Service: Neurosurgery;  Laterality: N/A;   NECK SURGERY     POSTERIOR CERVICAL FUSION/FORAMINOTOMY  12/05/2011   Procedure: POSTERIOR CERVICAL FUSION/FORAMINOTOMY LEVEL 4;  Surgeon: Faythe Ghee, MD;  Location: [facility];  Service: Neurosurgery;  Laterality: N/A;  Posterior Cervical fixation Cervical four-Seven,Cervical Decompresson Cervical three   THORACIC DISCECTOMY N/A 01/08/2021   Procedure: Thoracic One-Two, Thoracic Two-Three, Thoracic Eleven-Twelve, Thoracic Twelve-Lumbar One Laminectomy;  Surgeon: Earnie Larsson, MD;  Location: [place];  Service: Neurosurgery;  Laterality: N/A;    Family History  Problem Relation Age of Onset   Heart attack Mother 88   Heart disease Father     Social History   Socioeconomic History   Marital status: Married    Spouse name: Leticia Penna. [place]   Number of children: 1   Years of education: 12   Highest education level: Not on file  Occupational History   Not on file  Tobacco Use   Smoking status: Some Days    Packs/day: 0.25    Years: 50.00    Additional pack years: 0.00    Total pack years: 12.50  Types: Cigarettes   Smokeless tobacco: Never  Vaping Use   Vaping Use: Never used  Substance and Sexual Activity   Alcohol use: No   Drug use: Yes   Sexual activity: Not on file  Other Topics Concern   Not on file  Social History Narrative   Right handed   Lives with wife   No caffeine use   Social Determinants of Health   Financial Resource Strain: Low Risk  (09/18/2021)   Overall Financial Resource Strain (CARDIA)    Difficulty of Paying Living Expenses: Not hard at all  Food Insecurity: No Food Insecurity (09/18/2021)   Hunger Vital Sign    Worried About Running Out of Food in the Last Year: Never true    Ran Out of Food in the Last  Year: Never true  Transportation Needs: No Transportation Needs (09/18/2021)   PRAPARE - Hydrologist (Medical): No    Lack of Transportation (Non-Medical): No  Physical Activity: Insufficiently Active (09/18/2021)   Exercise Vital Sign    Days of Exercise per Week: 5 days    Minutes of Exercise per Session: 20 min  Stress: No Stress Concern Present (09/18/2021)   [place]    Feeling of Stress : Not at all  Social Connections: Moderately Integrated (09/18/2021)   Social Connection and Isolation Panel [NHANES]    Frequency of Communication with Friends and Family: More than three times a week    Frequency of Social Gatherings with Friends and Family: More than three times a week    Attends Religious Services: More than 4 times per year    Active Member of [employer] or Organizations: No    Attends Archivist Meetings: Never    Marital Status: Married  Human resources officer Violence: Not At Risk (09/18/2021)   Humiliation, Afraid, Rape, and Kick questionnaire    Fear of Current or Ex-Partner: No    Emotionally Abused: No    Physically Abused: No    Sexually Abused: No    Outpatient Medications Prior to Visit  Medication Sig Dispense Refill   aspirin 81 MG chewable tablet Chew 1 tablet (81 mg total) by mouth daily. 90 tablet 0   benazepril (LOTENSIN) 20 MG tablet Take 1 tablet (20 mg total) by mouth daily. 90 tablet 3   Cholecalciferol (VITAMIN D3) 1.25 MG (50000 UT) TABS Take 50,000 Units by mouth every Wednesday.     cholecalciferol (VITAMIN D3) 25 MCG (1000 UNIT) tablet Take 1,000 Units by mouth daily.     DULoxetine (CYMBALTA) 60 MG capsule TAKE 1 CAPSULE BY MOUTH DAILY 90 capsule 0   gabapentin (NEURONTIN) 300 MG capsule Take 1 capsule (300 mg total) by mouth 3 (three) times daily. 90 capsule 3   levothyroxine (SYNTHROID) 150 MCG tablet TAKE 1 TABLET BY MOUTH DAILY BEFORE BREAKFAST 90 tablet 2    loperamide (IMODIUM) 2 MG capsule Take 2 mg by mouth as needed for diarrhea or loose stools. One capsule with each loose stool - max of 5 doses in 24 hours.     simvastatin (ZOCOR) 20 MG tablet TAKE 1 TABLET BY MOUTH AT BEDTIME 90 tablet 0   tamsulosin (FLOMAX) 0.4 MG CAPS capsule Take 1 capsule (0.4 mg total) by mouth in the morning and at bedtime. 60 capsule 11   triamcinolone cream (KENALOG) 0.1 % Apply topically 2 (two) times daily. to affected area(s) 30 g 2   trimethoprim (TRIMPEX) 100  MG tablet Take 1 tablet (100 mg total) by mouth at bedtime. 30 tablet 11   UNABLE TO FIND Med Name: preserve vision (areds 2)     vitamin B-12 (CYANOCOBALAMIN) 1000 MCG tablet Take 1,000 mcg by mouth daily.     No facility-administered medications prior to visit.    Allergies  Allergen Reactions   Sulfa Antibiotics Other (See Comments)    Causes blisters and skin to peel from inside mouth    Review of Systems  Constitutional:  Negative for fatigue and fever.  Eyes:  Negative for visual disturbance.  Respiratory:  Negative for chest tightness and shortness of breath.   Cardiovascular:  Negative for chest pain and palpitations.  Genitourinary:  Positive for frequency. Negative for hematuria.  Musculoskeletal:  Positive for gait problem.  Neurological:  Positive for weakness (lower extremities). Negative for dizziness and headaches.       Objective:    Physical Exam HENT:     Head: Normocephalic.     Right Ear: External ear normal.     Left Ear: External ear normal.     Nose: No congestion or rhinorrhea.     Mouth/Throat:     Mouth: Mucous membranes are moist.  Cardiovascular:     Rate and Rhythm: Regular rhythm.     Heart sounds: No murmur heard. Pulmonary:     Effort: No respiratory distress.     Breath sounds: Normal breath sounds.  Musculoskeletal:     Comments: Decreased sensation in the lower extremities Pt is on a stretcher in the examination room there I am unable to assess  his gait   Neurological:     Mental Status: He is alert.     BP 114/67 (BP Location: Left Arm)   Pulse 94   Ht 6\' 7"  (2.007 m)   Wt 241 lb (109.3 kg) Comment: pt reported  SpO2 97%   BMI 27.15 kg/m  Wt Readings from Last 3 Encounters:  07/09/22 241 lb (109.3 kg)  06/06/22 241 lb (109.3 kg)  08/13/21 250 lb (113.4 kg)       Assessment & Plan:  Bacterial gastroenteritis Assessment & Plan: Given the patient's age and duration of his symptoms, will treat today with ciprofloxacin 500 mg twice daily  for 3 days Pending stool sample Encouraged to increase his fluid consumption, at least 64 ounces daily No evidence of dehydration noted on physical examination  Orders: -     Ciprofloxacin HCl; Take 1 tablet (500 mg total) by mouth 2 (two) times daily for 3 days.  Dispense: 6 tablet; Refill: 0  Diarrhea, unspecified type -     GI Profile, Stool, PCR  Acute cystitis without hematuria Assessment & Plan: Will treat today with Macrobid twice daily for 5 days Encouraged to follow-up with his urologist as scheduled   UTI symptoms -     POCT URINALYSIS DIP (CLINITEK) -     Urine Culture -     Nitrofurantoin Monohyd Macro; Take 1 capsule (100 mg total) by mouth 2 (two) times daily for 5 days.  Dispense: 10 capsule; Refill: 0  Weakness of both lower extremities Assessment & Plan: Will coordinate with his neurologist to formulate the best plan of care for the patient   Other hyperlipidemia -     CMP14+EGFR -     CBC with Differential/Platelet -     Lipid panel  Other specified disorders of thyroid -     TSH + free T4  Vitamin D deficiency -  VITAMIN D 25 Hydroxy (Vit-D Deficiency, Fractures)  Impaired fasting blood sugar -     Hemoglobin A1c    Alvira Monday, FNP

## 2022-07-09 NOTE — Patient Instructions (Addendum)
I appreciate the opportunity to provide care to you today!    Follow up:  09/05/22  Labs: please stop by the lab today to get your blood drawn (CBC, CMP, TSH, Lipid profile, HgA1c, Vit D)  Please pick up your prescription at the pharmacy and start therapy   Please continue to a heart-healthy diet and increase your physical activities. Try to exercise for 57mins at least five days a week.      It was a pleasure to see you and I look forward to continuing to work together on your health and well-being. Please do not hesitate to call the office if you need care or have questions about your care.   Have a wonderful day and week. With Gratitude, Alvira Monday MSN, FNP-BC

## 2022-07-10 DIAGNOSIS — N390 Urinary tract infection, site not specified: Secondary | ICD-10-CM | POA: Insufficient documentation

## 2022-07-10 DIAGNOSIS — A049 Bacterial intestinal infection, unspecified: Secondary | ICD-10-CM | POA: Insufficient documentation

## 2022-07-10 DIAGNOSIS — R29898 Other symptoms and signs involving the musculoskeletal system: Secondary | ICD-10-CM | POA: Insufficient documentation

## 2022-07-10 LAB — CBC WITH DIFFERENTIAL/PLATELET
Basophils Absolute: 0 10*3/uL (ref 0.0–0.2)
Basos: 0 %
EOS (ABSOLUTE): 0.2 10*3/uL (ref 0.0–0.4)
Eos: 2 %
Hematocrit: 38.5 % (ref 37.5–51.0)
Hemoglobin: 12.2 g/dL — ABNORMAL LOW (ref 13.0–17.7)
Immature Grans (Abs): 0 10*3/uL (ref 0.0–0.1)
Immature Granulocytes: 0 %
Lymphocytes Absolute: 1.3 10*3/uL (ref 0.7–3.1)
Lymphs: 19 %
MCH: 29.9 pg (ref 26.6–33.0)
MCHC: 31.7 g/dL (ref 31.5–35.7)
MCV: 94 fL (ref 79–97)
Monocytes Absolute: 0.4 10*3/uL (ref 0.1–0.9)
Monocytes: 5 %
Neutrophils Absolute: 5.2 10*3/uL (ref 1.4–7.0)
Neutrophils: 74 %
Platelets: 148 10*3/uL — ABNORMAL LOW (ref 150–450)
RBC: 4.08 x10E6/uL — ABNORMAL LOW (ref 4.14–5.80)
RDW: 13.2 % (ref 11.6–15.4)
WBC: 7.1 10*3/uL (ref 3.4–10.8)

## 2022-07-10 LAB — CMP14+EGFR
ALT: 9 IU/L (ref 0–44)
AST: 9 IU/L (ref 0–40)
Albumin/Globulin Ratio: 1.6 (ref 1.2–2.2)
Albumin: 4.2 g/dL (ref 3.8–4.8)
Alkaline Phosphatase: 118 IU/L (ref 44–121)
BUN/Creatinine Ratio: 23 (ref 10–24)
BUN: 27 mg/dL (ref 8–27)
Bilirubin Total: 0.4 mg/dL (ref 0.0–1.2)
CO2: 20 mmol/L (ref 20–29)
Calcium: 9.5 mg/dL (ref 8.6–10.2)
Chloride: 107 mmol/L — ABNORMAL HIGH (ref 96–106)
Creatinine, Ser: 1.2 mg/dL (ref 0.76–1.27)
Globulin, Total: 2.6 g/dL (ref 1.5–4.5)
Glucose: 77 mg/dL (ref 70–99)
Potassium: 5.9 mmol/L — ABNORMAL HIGH (ref 3.5–5.2)
Sodium: 139 mmol/L (ref 134–144)
Total Protein: 6.8 g/dL (ref 6.0–8.5)
eGFR: 63 mL/min/{1.73_m2} (ref >59)

## 2022-07-10 LAB — LIPID PANEL
Chol/HDL Ratio: 3.3 ratio (ref 0.0–5.0)
Cholesterol, Total: 112 mg/dL (ref 100–199)
HDL: 34 mg/dL — ABNORMAL LOW (ref >39)
LDL Chol Calc (NIH): 60 mg/dL (ref 0–99)
Triglycerides: 95 mg/dL (ref 0–149)
VLDL Cholesterol Cal: 18 mg/dL (ref 5–40)

## 2022-07-10 LAB — TSH+FREE T4
Free T4: 1.69 ng/dL (ref 0.82–1.77)
TSH: 0.66 u[IU]/mL (ref 0.450–4.500)

## 2022-07-10 LAB — HEMOGLOBIN A1C
Est. average glucose Bld gHb Est-mCnc: 105 mg/dL
Hgb A1c MFr Bld: 5.3 % (ref 4.8–5.6)

## 2022-07-10 LAB — VITAMIN D 25 HYDROXY (VIT D DEFICIENCY, FRACTURES): Vit D, 25-Hydroxy: 45.9 ng/mL (ref 30.0–100.0)

## 2022-07-10 NOTE — Assessment & Plan Note (Signed)
Given the patient's age and duration of his symptoms, will treat today with ciprofloxacin 500 mg twice daily  for 3 days Pending stool sample Encouraged to increase his fluid consumption, at least 64 ounces daily No evidence of dehydration noted on physical examination

## 2022-07-10 NOTE — Assessment & Plan Note (Signed)
Will treat today with Macrobid twice daily for 5 days Encouraged to follow-up with his urologist as scheduled

## 2022-07-10 NOTE — Assessment & Plan Note (Signed)
Will coordinate with his neurologist to formulate the best plan of care for the patient

## 2022-07-13 LAB — URINE CULTURE

## 2022-07-14 NOTE — Progress Notes (Signed)
Please inform the patient that his potassium levels are slightly elevated. I recommend decreasing his dietary intake of potassium-rich foods. I have also contacted his neurologist and am waiting for a response.

## 2022-07-17 ENCOUNTER — Other Ambulatory Visit: Payer: Self-pay | Admitting: Neurology

## 2022-07-17 ENCOUNTER — Other Ambulatory Visit: Payer: Self-pay | Admitting: Family Medicine

## 2022-07-17 ENCOUNTER — Telehealth: Payer: Self-pay | Admitting: Family Medicine

## 2022-07-17 DIAGNOSIS — R531 Weakness: Secondary | ICD-10-CM

## 2022-07-17 DIAGNOSIS — M5033 Other cervical disc degeneration, cervicothoracic region: Secondary | ICD-10-CM

## 2022-07-17 DIAGNOSIS — G959 Disease of spinal cord, unspecified: Secondary | ICD-10-CM

## 2022-07-17 DIAGNOSIS — R197 Diarrhea, unspecified: Secondary | ICD-10-CM

## 2022-07-17 MED ORDER — LOPERAMIDE HCL 2 MG PO CAPS: 2.0000 mg | ORAL_CAPSULE | ORAL | 0 refills | Status: DC | PRN

## 2022-07-17 NOTE — Telephone Encounter (Signed)
Pt's wife called wanting to know if the pt's lab results can be faxed over to Dr. Louanna Raw, [facility] [place] Please advise.    Pt wife requesting a call from nurse. Stated pt still can't walk or stand-up. Stated no one has called her to go over results of test done at office.

## 2022-07-17 NOTE — Telephone Encounter (Signed)
Spouse Called in on patient behalf. Wants a call back.  Hasn't heard anything back in regard to PCP speaking with Dr. Kerman Passey from [facility] in regard to patient legs.   Also states that patient is having stomach issues again and wants a refill on    loperamide (IMODIUM) 2 MG capsule

## 2022-07-17 NOTE — Telephone Encounter (Signed)
I called and spoke with wife [name] and explained that Dr.Zarwolo is in epic and she can see the results. No need to fax results.   Dr.Sater patient wife said she is concerned about her husband not being able to walk. Reports he hasn't walked since Nov or Dec. 2023. Pt is having numbness, wife reports pt "can't get out of the bed" he was brought in on a stretcher to the visit. Has PT referral, but has not started yet. She is aware of MRI spine results from 05/28/22. Wife states" I am not a physician, but how is he going to do PT if he can't even get up" . Wife said pt last back surgery was in 2023 Pt wife asked for any recommendations what to do about his legs?   Please advise

## 2022-07-18 ENCOUNTER — Telehealth: Payer: Self-pay | Admitting: Neurology

## 2022-07-18 NOTE — Telephone Encounter (Signed)
Called pt about his question about his appointment to get his Scans done. Pt states that he doesn't understand why Dr. Felecia Shelling wants the Scans done on his back when he already has a back doctor and they did their own scans. Pt is wanting for Dr. Felecia Shelling to get in contact with the Physician that take care of his back to get his previous scans. Told pt that if Dr. Felecia Shelling is ordering scans it's for a good reason and it is to get a clear diagnosis of what is going on with him. Pt then asked if we was going to call  [facility] to schedule his appointment, told pt they will call him to schedule his appointment. Pt then stated that he doesn't want to keep getting scans done because his insurance can't keep paying for him to get scans, especially if they are the same thing he had done with his back doctor. Told pt that soon as more information becomes available about the Scans he will get a call back with more information on why he is getting them. Pt verbalized understanding.

## 2022-07-18 NOTE — Telephone Encounter (Signed)
[employer] medicare/[place] medicaid NPR sent to GI [phone] 

## 2022-07-18 NOTE — Telephone Encounter (Signed)
Pt called needing to speak to the RN regarding the reason why is he having these certain CT Scans done. Please advise.

## 2022-07-18 NOTE — Telephone Encounter (Signed)
Called and spoke with pt wife. Relayed Dr. Garth Bigness message. She verbalized understanding. She will be on look out for phone call to schedule. Aware they have to obtain insurance approval first.

## 2022-07-22 NOTE — Telephone Encounter (Signed)
Pt's Wife called and asked if [facility] is going to call her husband to set up appointment for scans, told pt's wife yes they will. Pt's wife verbalized understanding.

## 2022-07-22 NOTE — Telephone Encounter (Signed)
Pt's wife has been told by GI this scan needs to be done at the hospital, please call spouse to arrange.

## 2022-07-22 NOTE — Telephone Encounter (Signed)
Spoke with patient wife and informed her with the below from Dr.Sater . Pt has not seen his neurosurgeon in past few months, pt wife said he will have scan done. Wife informed she can call to schedule scan at [facility] or wait for them to call them. Wife said she will call.   "Has he gone back to see his neurosurgeon in the past few months?  His problems are most likely due to his back and spinal cord and not a problem with his nerves.    It is unclear whether his weakness is due entirely to the old damage or if there has been additional compression since the CT myelogram from 2023. That is why I wanted to check a new CT myelogram.    My recommendation is that he get the test.  However, if he prefers, he can see his neurosurgeon and see if they have a different recommendation "

## 2022-07-22 NOTE — Telephone Encounter (Signed)
LVM for pt to call back before 5pm today to talk about Scans

## 2022-07-22 NOTE — Telephone Encounter (Signed)
I have sent the order to [facility] to schedule. [phone]

## 2022-07-30 ENCOUNTER — Telehealth: Payer: Self-pay

## 2022-07-30 NOTE — Telephone Encounter (Signed)
pt spouse reports him feeling, warm to touch, feeling sweaty, having trouble breathing breathing, refuses to go to the hospital, unable to do a video visit can only do a telephone visit with you, please advice.

## 2022-07-31 ENCOUNTER — Telehealth: Payer: Self-pay

## 2022-07-31 NOTE — Telephone Encounter (Signed)
Patient spouse called stating that EMS was there, took vitals and they was fine. EMS thinks its just a stomach virus that has to run its course. Patient was still having sweats. Advised patient that he may need to go to ED for fluids if continues but he refuses to go ED. Patient and wife advised to go to ED if symptoms don't get any better.

## 2022-08-01 ENCOUNTER — Encounter (HOSPITAL_COMMUNITY): Payer: Self-pay | Admitting: *Deleted

## 2022-08-01 ENCOUNTER — Other Ambulatory Visit: Payer: Self-pay

## 2022-08-01 ENCOUNTER — Emergency Department (HOSPITAL_COMMUNITY)
Admission: EM | Admit: 2022-08-01 | Discharge: 2022-08-01 | Disposition: A | Discharge status: Home / Self Care | Payer: 59 | Attending: Emergency Medicine | Admitting: Emergency Medicine

## 2022-08-01 ENCOUNTER — Emergency Department (HOSPITAL_COMMUNITY): Payer: 59

## 2022-08-01 DIAGNOSIS — K551 Chronic vascular disorders of intestine: Secondary | ICD-10-CM | POA: Diagnosis not present

## 2022-08-01 DIAGNOSIS — Z79899 Other long term (current) drug therapy: Secondary | ICD-10-CM | POA: Insufficient documentation

## 2022-08-01 DIAGNOSIS — I7143 Infrarenal abdominal aortic aneurysm, without rupture: Secondary | ICD-10-CM | POA: Diagnosis not present

## 2022-08-01 DIAGNOSIS — Z87891 Personal history of nicotine dependence: Secondary | ICD-10-CM | POA: Diagnosis not present

## 2022-08-01 DIAGNOSIS — I10 Essential (primary) hypertension: Secondary | ICD-10-CM | POA: Diagnosis not present

## 2022-08-01 DIAGNOSIS — Z7982 Long term (current) use of aspirin: Secondary | ICD-10-CM | POA: Diagnosis not present

## 2022-08-01 DIAGNOSIS — R112 Nausea with vomiting, unspecified: Secondary | ICD-10-CM

## 2022-08-01 DIAGNOSIS — R1110 Vomiting, unspecified: Secondary | ICD-10-CM | POA: Diagnosis present

## 2022-08-01 DIAGNOSIS — I7121 Aneurysm of the ascending aorta, without rupture: Secondary | ICD-10-CM | POA: Insufficient documentation

## 2022-08-01 LAB — COMPREHENSIVE METABOLIC PANEL
ALT: 10 U/L (ref 0–44)
AST: 12 U/L — ABNORMAL LOW (ref 15–41)
Albumin: 4.1 g/dL (ref 3.5–5.0)
Alkaline Phosphatase: 90 U/L (ref 38–126)
Anion gap: 11 (ref 5–15)
BUN: 34 mg/dL — ABNORMAL HIGH (ref 8–23)
CO2: 19 mmol/L — ABNORMAL LOW (ref 22–32)
Calcium: 9.5 mg/dL (ref 8.9–10.3)
Chloride: 105 mmol/L (ref 98–111)
Creatinine, Ser: 1.17 mg/dL (ref 0.61–1.24)
GFR, Estimated: >60 mL/min (ref >60)
Glucose, Bld: 109 mg/dL — ABNORMAL HIGH (ref 70–99)
Potassium: 4.8 mmol/L (ref 3.5–5.1)
Sodium: 135 mmol/L (ref 135–145)
Total Bilirubin: 0.5 mg/dL (ref 0.3–1.2)
Total Protein: 7.2 g/dL (ref 6.5–8.1)

## 2022-08-01 LAB — CBC
HCT: 37.2 % — ABNORMAL LOW (ref 39.0–52.0)
Hemoglobin: 12.3 g/dL — ABNORMAL LOW (ref 13.0–17.0)
MCH: 30.4 pg (ref 26.0–34.0)
MCHC: 33.1 g/dL (ref 30.0–36.0)
MCV: 91.9 fL (ref 80.0–100.0)
Platelets: 178 10*3/uL (ref 150–400)
RBC: 4.05 MIL/uL — ABNORMAL LOW (ref 4.22–5.81)
RDW: 13.6 % (ref 11.5–15.5)
WBC: 12.6 10*3/uL — ABNORMAL HIGH (ref 4.0–10.5)
nRBC: 0 % (ref 0.0–0.2)

## 2022-08-01 LAB — LACTIC ACID, PLASMA
Lactic Acid, Venous: 1.2 mmol/L (ref 0.5–1.9)
Lactic Acid, Venous: 1.3 mmol/L (ref 0.5–1.9)

## 2022-08-01 LAB — TROPONIN I (HIGH SENSITIVITY)
Troponin I (High Sensitivity): 7 ng/L (ref <18)
Troponin I (High Sensitivity): 6 ng/L (ref <18)

## 2022-08-01 LAB — LIPASE, BLOOD: Lipase: 24 U/L (ref 11–51)

## 2022-08-01 MED ORDER — DIPHENHYDRAMINE HCL 50 MG/ML IJ SOLN
12.5000 mg | Freq: Once | INTRAMUSCULAR | Status: AC
  Administered 2022-08-01: 12.5 mg via INTRAVENOUS
  Filled 2022-08-01: qty 1

## 2022-08-01 MED ORDER — SODIUM CHLORIDE 0.9 % IV BOLUS
1000.0000 mL | Freq: Once | INTRAVENOUS | Status: AC
  Administered 2022-08-01: 1000 mL via INTRAVENOUS

## 2022-08-01 MED ORDER — PROCHLORPERAZINE EDISYLATE 10 MG/2ML IJ SOLN
5.0000 mg | Freq: Once | INTRAMUSCULAR | Status: AC
  Administered 2022-08-01: 5 mg via INTRAVENOUS
  Filled 2022-08-01: qty 2

## 2022-08-01 MED ORDER — PROCHLORPERAZINE MALEATE 5 MG PO TABS: 5.0000 mg | ORAL_TABLET | Freq: Four times a day (QID) | ORAL | 0 refills | Status: DC | PRN

## 2022-08-01 MED ORDER — MAGNESIUM SULFATE 2 GM/50ML IV SOLN
2.0000 g | INTRAVENOUS | Status: AC
  Administered 2022-08-01: 2 g via INTRAVENOUS
  Filled 2022-08-01: qty 50

## 2022-08-01 MED ORDER — ONDANSETRON HCL 4 MG/2ML IJ SOLN
4.0000 mg | Freq: Once | INTRAMUSCULAR | Status: AC
  Administered 2022-08-01: 4 mg via INTRAVENOUS
  Filled 2022-08-01: qty 2

## 2022-08-01 MED ORDER — IOHEXOL 350 MG/ML SOLN
150.0000 mL | Freq: Once | INTRAVENOUS | Status: AC | PRN
  Administered 2022-08-01: 150 mL via INTRAVENOUS

## 2022-08-01 NOTE — ED Notes (Signed)
ED Provider at bedside. 

## 2022-08-01 NOTE — Discharge Instructions (Signed)
Contact a health care provider if: Your symptoms get worse. You have new symptoms. You have a fever. You cannot drink fluids without vomiting. Your nausea does not go away after 2 days. You feel light-headed or dizzy. You have a headache. You have muscle cramps. You have a rash. You have pain while urinating. Get help right away if: You have pain in your chest, neck, arm, or jaw. You feel extremely weak or you faint. You have persistent vomiting. You have vomit that is bright red or looks like black coffee grounds. You have bloody or black stools (feces) or stools that look like tar. You have a severe headache, a stiff neck, or both. You have severe pain, cramping, or bloating in your abdomen. You have difficulty breathing, or you are breathing very quickly. Your heart is beating very quickly. Your skin feels cold and clammy. You feel confused. You have signs of dehydration, such as: Dark urine, very little urine, or no urine. Cracked lips. Dry mouth. Sunken eyes. Sleepiness. Weakness. These symptoms may be an emergency. Get help right away. Call 911. Do not wait to see if the symptoms will go away. Do not drive yourself to the hospital.

## 2022-08-01 NOTE — ED Triage Notes (Signed)
[facility] reports pt with N/V x 3 days.  Pt A&Ox 4 .  Pt does c/o mild abd pain.  States other family members with same symptoms but not as bad.  Pt denies any diarrhea.

## 2022-08-01 NOTE — ED Provider Notes (Signed)
[place] EMERGENCY DEPARTMENT AT [facility] Provider Note   CSN: [id number] Arrival date & time: 08/01/22  1223     History  Chief Complaint  Patient presents with   Emesis    [name] is a 77 y.o. 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  Patient states he has been unable to eat solid foods.  He has been able to hold down Pedialyte at home.  He has had 3 weeks of intermittent episodes of diarrhea.  He was seen in urgent care earlier this week and given Zofran.  This morning his wife noted that he looked much worse and was pale and diaphoretic so decided to bring him to the emergency department.  The patient complains that he is having room spinning dizziness which is new along with 4 out of 10 left lower quadrant abdominal pain.  He has no previous history of vertigo or abdominal surgeries.  He does have heartburn regularly.  He is unable to ambulate to tell me if he has ataxia.  He denies any chest pain at this time.     Emesis      Home Medications Prior to Admission medications   Medication Sig Start Date End Date Taking? Authorizing Provider  aspirin 81 MG chewable tablet Chew 1 tablet (81 mg total) by mouth daily. 09/04/21  Yes Gilmore Laroche, FNP  DULoxetine (CYMBALTA) 60 MG capsule TAKE 1 CAPSULE BY MOUTH DAILY 05/31/22  Yes Gilmore Laroche, FNP  gabapentin (NEURONTIN) 300 MG capsule Take 1 capsule (300 mg total) by mouth 3 (three) times daily. 06/06/22  Yes Gilmore Laroche, FNP  levothyroxine (SYNTHROID) 150 MCG tablet TAKE 1 TABLET BY MOUTH DAILY BEFORE BREAKFAST 02/04/22  Yes Gilmore Laroche, FNP  loperamide (IMODIUM) 2 MG capsule Take 1 capsule (2 mg total) by mouth as needed for diarrhea or loose stools. One capsule with  each loose stool - max of 5 doses in 24 hours. 07/17/22  Yes Gilmore Laroche, FNP  prochlorperazine (COMPAZINE) 5 MG tablet Take 1 tablet (5 mg total) by mouth every 6 (six) hours as needed for nausea or vomiting. 08/01/22  Yes Nuria Phebus, PA-C  simvastatin (ZOCOR) 20 MG tablet TAKE 1 TABLET BY MOUTH AT BEDTIME 07/02/22  Yes Gilmore Laroche, FNP  tamsulosin (FLOMAX) 0.4 MG CAPS capsule Take 1 capsule (0.4 mg total) by mouth in the morning and at bedtime. 09/20/21  Yes Gilmore Laroche, FNP  triamcinolone cream (KENALOG) 0.1 % Apply topically 2 (two) times daily. to affected area(s) 04/04/22  Yes Gilmore Laroche, FNP  trimethoprim (TRIMPEX) 100 MG tablet Take 1 tablet (100 mg total) by mouth at bedtime. 09/15/21  Yes [name], [name], MD  UNABLE TO FIND Take 1 tablet by mouth 2 (two) times daily. Med Name: preserve vision (areds 2)   Yes [provider]  vitamin B-12 (CYANOCOBALAMIN) 1000 MCG tablet Take 1,000 mcg by mouth daily.   Yes [provider]  benazepril (LOTENSIN) 20 MG tablet TAKE 1 TABLET BY MOUTH EVERY DAY 08/05/22   Gilmore Laroche, FNP  Cholecalciferol (VITAMIN D3) 1.25 MG (50000 UT) TABS Take 50,000 Units by mouth every Wednesday.    [provider]      Allergies    Sulfa antibiotics    Review of Systems   Review of  Systems  Gastrointestinal:  Positive for vomiting.    Physical Exam Updated Vital Signs BP 139/77   Pulse 69   Temp 97.7 F (36.5 C) (Oral)   Resp 16   SpO2 96%  Physical Exam Vitals and nursing note reviewed.  Constitutional:      General: He is not in acute distress.    Appearance: He is well-developed. He is not diaphoretic.     Comments: Patient appears chronically ill  HENT:     Head: Normocephalic and atraumatic.     Mouth/Throat:     Mouth: Mucous membranes are dry.     Comments: Dry oral membranes Eyes:     General: No scleral icterus.    Extraocular Movements: Extraocular movements intact.     Conjunctiva/sclera:  Conjunctivae normal.     Pupils: Pupils are equal, round, and reactive to light.  Cardiovascular:     Rate and Rhythm: Normal rate and regular rhythm.     Heart sounds: Normal heart sounds.  Pulmonary:     Effort: Pulmonary effort is normal. No respiratory distress.     Breath sounds: Rhonchi present.  Abdominal:     Palpations: Abdomen is soft.     Tenderness: There is abdominal tenderness.     Comments: Tenderness and fullness as well as firmness noted in the left lower quadrant of the abdomen, remainder of patient's abdominal quadrants are soft and nontender.  Musculoskeletal:     Cervical back: Normal range of motion and neck supple.  Skin:    General: Skin is warm and dry.  Neurological:     Mental Status: He is alert.  Psychiatric:        Behavior: Behavior normal.   ED Results / Procedures / Treatments   Labs (all labs ordered are listed, but only abnormal results are displayed) Labs Reviewed  COMPREHENSIVE METABOLIC PANEL - Abnormal; Notable for the following components:      Result Value   CO2 19 (*)    Glucose, Bld 109 (*)    BUN 34 (*)    AST 12 (*)    All other components within normal limits  CBC - Abnormal; Notable for the following components:   WBC 12.6 (*)    RBC 4.05 (*)    Hemoglobin 12.3 (*)    HCT 37.2 (*)    All other components within normal limits  LIPASE, BLOOD  LACTIC ACID, PLASMA  LACTIC ACID, PLASMA  TROPONIN I (HIGH SENSITIVITY)  TROPONIN I (HIGH SENSITIVITY)    EKG EKG Interpretation  Date/Time:  Thursday August 01 2022 15:26:13 EDT Ventricular Rate:  86 PR Interval:  214 QRS Duration: 96 QT Interval:  353 QTC Calculation: 423 R Axis:   89 Text Interpretation: Sinus rhythm Borderline prolonged PR interval Borderline right axis deviation Minimal ST elevation, inferior leads No acute changes No significant change since last tracing Confirmed by Derwood Kaplan [id number]) on 08/01/2022 4:50:20 PM  Radiology No results  found.  Procedures Procedures    Medications Ordered in ED Medications  ondansetron (ZOFRAN) injection 4 mg (4 mg Intravenous Given 08/01/22 1315)  sodium chloride 0.9 % bolus 1,000 mL (0 mLs Intravenous Stopped 08/01/22 1730)  prochlorperazine (COMPAZINE) injection 5 mg (5 mg Intravenous Given 08/01/22 1406)  diphenhydrAMINE (BENADRYL) injection 12.5 mg (12.5 mg Intravenous Given 08/01/22 1406)  iohexol (OMNIPAQUE) 350 MG/ML injection 150 mL (150 mLs Intravenous Contrast Given 08/01/22 1436)  magnesium sulfate IVPB 2 g 50 mL (0 g Intravenous Stopped 08/01/22 1810)  ED Course/ Medical Decision Making/ A&P Clinical Course as of 08/05/22 1215  Thu Aug 01, 2022  1507 BUN(!): 34 [AH]  1507 WBC(!): 12.6 [AH]    Clinical Course User Index [AH] Arthor Captain, PA-C                             Medical Decision Making This patient presents to the ED for concern of vomiting, this involves an extensive number of treatment options, and is a complaint that carries with it a high risk of complications and morbidity.  The emergent differential diagnosis for vomiting includes, but is not limited to ACS/MI, DKA, Ischemic bowel, Meningitis, Sepsis, Acute gastric dilation, Adrenal insufficiency, Appendicitis,  Bowel obstruction/ileus, Carbon monoxide poisoning, Cholecystitis, Electrolyte abnormalities, Elevated ICP, Gastric outlet obstruction, Pancreatitis, Ruptured viscus, Biliary colic, Cannabinoid hyperemesis syndrome, Gastritis, Gastroenteritis, Gastroparesis,  Narcotic withdrawal, Peptic ulcer disease, and UTI   Co morbidities:      paraplegia,chronic pain meds. Long hx of smoking  Social Determinants of Health:       unable to walk, cared for by wife  Additional history:  {Additional history obtained from sife at bedside   Lab Tests:  I Ordered, and personally interpreted labs.  The pertinent results include:    Bicarb 19- likely GI loss Wbc - mild leukocytosis Hgb 12.3 Troponin  wnl  Imaging Studies:  I ordered imaging studies including ct angio c/a/p, and cta head and neck I independently visualized and interpreted imaging which showed NO acute findings Chronic vascular findings of the aorta and sma I agree with the radiologist interpretation  Cardiac Monitoring/ECG:       The patient was maintained on a cardiac monitor.  I personally viewed and interpreted the cardiac monitored which showed an underlying rhythm of:   EKG Interpretation Ventricular Rate:  86 PR Interval:  214 QRS Duration: 96 QT Interval:  353 QTC Calculation: 423 R Axis:   89 Text Interpretation: Sinus rhythm Borderline prolonged PR interval  Borderline right axis deviation Minimal ST elevation, inferior leads No  acute changes        Medicines ordered and prescription drug management:  I ordered medication including Medications ondansetron (ZOFRAN) injection 4 mg (4 mg Intravenous Given 08/01/22 1315) sodium chloride 0.9 % bolus 1,000 mL (0 mLs Intravenous Stopped 08/01/22 1730) prochlorperazine (COMPAZINE) injection 5 mg (5 mg Intravenous Given 08/01/22 1406) diphenhydrAMINE (BENADRYL) injection 12.5 mg (12.5 mg Intravenous Given 08/01/22 1406) iohexol (OMNIPAQUE) 350 MG/ML injection 150 mL (150 mLs Intravenous Contrast Given 08/01/22 1436) magnesium sulfate IVPB 2 g 50 mL (0 g Intravenous Stopped 08/01/22 1810) for dehydration and vomiting Reevaluation of the patient after these medicines showed that the patient improved I have reviewed the patients home medicines and have made adjustments as needed  Test Considered:         Critical Interventions:         Consultations Obtained:   Problem List / ED Course:       (R11.2) Nausea and vomiting, unspecified vomiting type  (primary encounter diagnosis)  (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  Amount and/or Complexity of Data Reviewed Labs: ordered. Decision-making details documented in ED Course. Radiology: ordered. ECG/medicine tests: ordered.  Risk Prescription drug management.           Final Clinical Impression(s) / ED Diagnoses Final diagnoses:  Nausea and vomiting, unspecified vomiting type  Aneurysm of ascending aorta without rupture  Infrarenal abdominal aortic aneurysm (AAA) without rupture  Superior mesenteric artery atherosclerosis    Rx / DC Orders ED Discharge Orders          Ordered    prochlorperazine (COMPAZINE) 5 MG tablet  Every 6 hours PRN        08/01/22 1731              Arthor Captain, PA-C 08/05/22 1215    Linwood Dibbles, MD 08/07/22 1513

## 2022-08-04 ENCOUNTER — Other Ambulatory Visit: Payer: Self-pay | Admitting: Family Medicine

## 2022-08-04 DIAGNOSIS — I10 Essential (primary) hypertension: Secondary | ICD-10-CM

## 2022-08-08 ENCOUNTER — Other Ambulatory Visit: Payer: Self-pay | Admitting: Family Medicine

## 2022-08-08 DIAGNOSIS — R197 Diarrhea, unspecified: Secondary | ICD-10-CM

## 2022-08-09 ENCOUNTER — Other Ambulatory Visit: Payer: Self-pay | Admitting: Family Medicine

## 2022-08-09 ENCOUNTER — Ambulatory Visit (HOSPITAL_COMMUNITY): Payer: 59

## 2022-08-28 ENCOUNTER — Other Ambulatory Visit: Payer: Self-pay | Admitting: Family Medicine

## 2022-08-28 ENCOUNTER — Other Ambulatory Visit: Payer: Self-pay | Admitting: Urology

## 2022-08-28 DIAGNOSIS — N138 Other obstructive and reflux uropathy: Secondary | ICD-10-CM

## 2022-08-28 DIAGNOSIS — N3000 Acute cystitis without hematuria: Secondary | ICD-10-CM

## 2022-08-28 DIAGNOSIS — G609 Hereditary and idiopathic neuropathy, unspecified: Secondary | ICD-10-CM

## 2022-08-28 DIAGNOSIS — N401 Enlarged prostate with lower urinary tract symptoms: Secondary | ICD-10-CM

## 2022-08-30 ENCOUNTER — Ambulatory Visit (HOSPITAL_COMMUNITY): Payer: 59

## 2022-09-05 ENCOUNTER — Ambulatory Visit: Payer: Medicaid Other | Admitting: Family Medicine

## 2022-09-11 ENCOUNTER — Telehealth: Payer: Self-pay | Admitting: Family Medicine

## 2022-09-11 ENCOUNTER — Ambulatory Visit: Payer: 59

## 2022-09-11 ENCOUNTER — Ambulatory Visit: Payer: Medicaid Other

## 2022-09-11 NOTE — Telephone Encounter (Signed)
PCS forms  Copied Noted Sleeved  Call patient when ready

## 2022-09-13 ENCOUNTER — Inpatient Hospital Stay: Admit: 2022-09-13 | Payer: 59 | Admitting: Family Medicine

## 2022-09-13 ENCOUNTER — Encounter (HOSPITAL_COMMUNITY): Payer: Self-pay

## 2022-09-14 ENCOUNTER — Inpatient Hospital Stay (HOSPITAL_COMMUNITY)
Admission: EM | Admit: 2022-09-14 | Discharge: 2022-09-20 | DRG: 683 | Disposition: A | Discharge status: Skilled Nursing Facility | Payer: 59 | Attending: Internal Medicine | Admitting: Internal Medicine

## 2022-09-14 ENCOUNTER — Inpatient Hospital Stay (HOSPITAL_COMMUNITY): Payer: 59

## 2022-09-14 ENCOUNTER — Encounter (HOSPITAL_COMMUNITY): Payer: Self-pay | Admitting: Internal Medicine

## 2022-09-14 ENCOUNTER — Other Ambulatory Visit: Payer: Self-pay

## 2022-09-14 DIAGNOSIS — L89312 Pressure ulcer of right buttock, stage 2: Secondary | ICD-10-CM | POA: Diagnosis present

## 2022-09-14 DIAGNOSIS — G959 Disease of spinal cord, unspecified: Secondary | ICD-10-CM | POA: Diagnosis present

## 2022-09-14 DIAGNOSIS — E785 Hyperlipidemia, unspecified: Secondary | ICD-10-CM | POA: Diagnosis present

## 2022-09-14 DIAGNOSIS — R195 Other fecal abnormalities: Secondary | ICD-10-CM

## 2022-09-14 DIAGNOSIS — Z792 Long term (current) use of antibiotics: Secondary | ICD-10-CM

## 2022-09-14 DIAGNOSIS — D696 Thrombocytopenia, unspecified: Secondary | ICD-10-CM | POA: Diagnosis present

## 2022-09-14 DIAGNOSIS — D638 Anemia in other chronic diseases classified elsewhere: Secondary | ICD-10-CM | POA: Insufficient documentation

## 2022-09-14 DIAGNOSIS — J449 Chronic obstructive pulmonary disease, unspecified: Secondary | ICD-10-CM | POA: Diagnosis present

## 2022-09-14 DIAGNOSIS — R112 Nausea with vomiting, unspecified: Secondary | ICD-10-CM

## 2022-09-14 DIAGNOSIS — Z7982 Long term (current) use of aspirin: Secondary | ICD-10-CM

## 2022-09-14 DIAGNOSIS — K2990 Gastroduodenitis, unspecified, without bleeding: Secondary | ICD-10-CM | POA: Diagnosis present

## 2022-09-14 DIAGNOSIS — E875 Hyperkalemia: Secondary | ICD-10-CM | POA: Diagnosis present

## 2022-09-14 DIAGNOSIS — R748 Abnormal levels of other serum enzymes: Secondary | ICD-10-CM | POA: Diagnosis not present

## 2022-09-14 DIAGNOSIS — I7143 Infrarenal abdominal aortic aneurysm, without rupture: Secondary | ICD-10-CM | POA: Diagnosis present

## 2022-09-14 DIAGNOSIS — F17210 Nicotine dependence, cigarettes, uncomplicated: Secondary | ICD-10-CM | POA: Diagnosis present

## 2022-09-14 DIAGNOSIS — E039 Hypothyroidism, unspecified: Secondary | ICD-10-CM | POA: Diagnosis present

## 2022-09-14 DIAGNOSIS — I110 Hypertensive heart disease with heart failure: Secondary | ICD-10-CM | POA: Diagnosis present

## 2022-09-14 DIAGNOSIS — I10 Essential (primary) hypertension: Secondary | ICD-10-CM | POA: Diagnosis not present

## 2022-09-14 DIAGNOSIS — K219 Gastro-esophageal reflux disease without esophagitis: Secondary | ICD-10-CM | POA: Diagnosis present

## 2022-09-14 DIAGNOSIS — E782 Mixed hyperlipidemia: Secondary | ICD-10-CM

## 2022-09-14 DIAGNOSIS — K8020 Calculus of gallbladder without cholecystitis without obstruction: Secondary | ICD-10-CM | POA: Diagnosis present

## 2022-09-14 DIAGNOSIS — H3530 Unspecified macular degeneration: Secondary | ICD-10-CM | POA: Diagnosis present

## 2022-09-14 DIAGNOSIS — I959 Hypotension, unspecified: Secondary | ICD-10-CM | POA: Diagnosis present

## 2022-09-14 DIAGNOSIS — K2950 Unspecified chronic gastritis without bleeding: Secondary | ICD-10-CM | POA: Diagnosis not present

## 2022-09-14 DIAGNOSIS — D649 Anemia, unspecified: Secondary | ICD-10-CM | POA: Diagnosis present

## 2022-09-14 DIAGNOSIS — K5730 Diverticulosis of large intestine without perforation or abscess without bleeding: Secondary | ICD-10-CM | POA: Diagnosis present

## 2022-09-14 DIAGNOSIS — Z981 Arthrodesis status: Secondary | ICD-10-CM

## 2022-09-14 DIAGNOSIS — I509 Heart failure, unspecified: Secondary | ICD-10-CM | POA: Diagnosis not present

## 2022-09-14 DIAGNOSIS — R197 Diarrhea, unspecified: Secondary | ICD-10-CM | POA: Diagnosis present

## 2022-09-14 DIAGNOSIS — I5032 Chronic diastolic (congestive) heart failure: Secondary | ICD-10-CM | POA: Diagnosis present

## 2022-09-14 DIAGNOSIS — N400 Enlarged prostate without lower urinary tract symptoms: Secondary | ICD-10-CM | POA: Diagnosis present

## 2022-09-14 DIAGNOSIS — E860 Dehydration: Secondary | ICD-10-CM | POA: Diagnosis present

## 2022-09-14 DIAGNOSIS — N179 Acute kidney failure, unspecified: Secondary | ICD-10-CM | POA: Diagnosis present

## 2022-09-14 DIAGNOSIS — N3000 Acute cystitis without hematuria: Secondary | ICD-10-CM

## 2022-09-14 DIAGNOSIS — Z79890 Hormone replacement therapy (postmenopausal): Secondary | ICD-10-CM

## 2022-09-14 DIAGNOSIS — K2970 Gastritis, unspecified, without bleeding: Secondary | ICD-10-CM

## 2022-09-14 DIAGNOSIS — G2581 Restless legs syndrome: Secondary | ICD-10-CM | POA: Diagnosis present

## 2022-09-14 DIAGNOSIS — N390 Urinary tract infection, site not specified: Secondary | ICD-10-CM | POA: Diagnosis present

## 2022-09-14 DIAGNOSIS — E872 Acidosis, unspecified: Secondary | ICD-10-CM | POA: Diagnosis present

## 2022-09-14 DIAGNOSIS — L8990 Pressure ulcer of unspecified site, unspecified stage: Secondary | ICD-10-CM | POA: Insufficient documentation

## 2022-09-14 DIAGNOSIS — Z96651 Presence of right artificial knee joint: Secondary | ICD-10-CM | POA: Diagnosis present

## 2022-09-14 DIAGNOSIS — Z87440 Personal history of urinary (tract) infections: Secondary | ICD-10-CM

## 2022-09-14 DIAGNOSIS — Z8249 Family history of ischemic heart disease and other diseases of the circulatory system: Secondary | ICD-10-CM

## 2022-09-14 DIAGNOSIS — N138 Other obstructive and reflux uropathy: Secondary | ICD-10-CM

## 2022-09-14 DIAGNOSIS — N281 Cyst of kidney, acquired: Secondary | ICD-10-CM | POA: Diagnosis present

## 2022-09-14 DIAGNOSIS — Z79899 Other long term (current) drug therapy: Secondary | ICD-10-CM

## 2022-09-14 DIAGNOSIS — Z882 Allergy status to sulfonamides status: Secondary | ICD-10-CM

## 2022-09-14 HISTORY — DX: Nausea with vomiting, unspecified: R11.2

## 2022-09-14 LAB — I-STAT CHEM 8, ED
BUN: >130 mg/dL — ABNORMAL HIGH (ref 8–23)
Calcium, Ion: 1.2 mmol/L (ref 1.15–1.40)
Chloride: 116 mmol/L — ABNORMAL HIGH (ref 98–111)
Creatinine, Ser: 7.2 mg/dL — ABNORMAL HIGH (ref 0.61–1.24)
Glucose, Bld: 91 mg/dL (ref 70–99)
HCT: 24 % — ABNORMAL LOW (ref 39.0–52.0)
Hemoglobin: 8.2 g/dL — ABNORMAL LOW (ref 13.0–17.0)
Potassium: 5.4 mmol/L — ABNORMAL HIGH (ref 3.5–5.1)
Sodium: 143 mmol/L (ref 135–145)
TCO2: 15 mmol/L — ABNORMAL LOW (ref 22–32)

## 2022-09-14 LAB — URINALYSIS, ROUTINE W REFLEX MICROSCOPIC
Bilirubin Urine: NEGATIVE
Glucose, UA: NEGATIVE mg/dL
Ketones, ur: NEGATIVE mg/dL
Nitrite: NEGATIVE
Protein, ur: 30 mg/dL — AB
Specific Gravity, Urine: 1.009 (ref 1.005–1.030)
WBC, UA: >50 WBC/hpf (ref 0–5)
pH: 5 (ref 5.0–8.0)

## 2022-09-14 LAB — TYPE AND SCREEN
ABO/RH(D): O POS
Antibody Screen: NEGATIVE

## 2022-09-14 LAB — CBC WITH DIFFERENTIAL/PLATELET
Abs Immature Granulocytes: 0.03 10*3/uL (ref 0.00–0.07)
Basophils Absolute: 0 10*3/uL (ref 0.0–0.1)
Basophils Relative: 0 %
Eosinophils Absolute: 0.2 10*3/uL (ref 0.0–0.5)
Eosinophils Relative: 4 %
HCT: 27.4 % — ABNORMAL LOW (ref 39.0–52.0)
Hemoglobin: 8.8 g/dL — ABNORMAL LOW (ref 13.0–17.0)
Immature Granulocytes: 1 %
Lymphocytes Relative: 7 %
Lymphs Abs: 0.3 10*3/uL — ABNORMAL LOW (ref 0.7–4.0)
MCH: 31.2 pg (ref 26.0–34.0)
MCHC: 32.1 g/dL (ref 30.0–36.0)
MCV: 97.2 fL (ref 80.0–100.0)
Monocytes Absolute: 0.2 10*3/uL (ref 0.1–1.0)
Monocytes Relative: 4 %
Neutro Abs: 3.9 10*3/uL (ref 1.7–7.7)
Neutrophils Relative %: 84 %
Platelets: 92 10*3/uL — ABNORMAL LOW (ref 150–400)
RBC: 2.82 MIL/uL — ABNORMAL LOW (ref 4.22–5.81)
RDW: 14.4 % (ref 11.5–15.5)
WBC: 4.7 10*3/uL (ref 4.0–10.5)
nRBC: 0 % (ref 0.0–0.2)

## 2022-09-14 LAB — MAGNESIUM: Magnesium: 1.1 mg/dL — ABNORMAL LOW (ref 1.7–2.4)

## 2022-09-14 LAB — TSH: TSH: 0.553 u[IU]/mL (ref 0.350–4.500)

## 2022-09-14 LAB — RETICULOCYTES
Immature Retic Fract: 7.2 % (ref 2.3–15.9)
RBC.: 2.86 MIL/uL — ABNORMAL LOW (ref 4.22–5.81)
Retic Count, Absolute: 29.5 10*3/uL (ref 19.0–186.0)
Retic Ct Pct: 1 % (ref 0.4–3.1)

## 2022-09-14 LAB — CREATININE, URINE, RANDOM: Creatinine, Urine: 56 mg/dL

## 2022-09-14 LAB — COMPREHENSIVE METABOLIC PANEL
ALT: 7 U/L (ref 0–44)
AST: 9 U/L — ABNORMAL LOW (ref 15–41)
Albumin: 2.8 g/dL — ABNORMAL LOW (ref 3.5–5.0)
Alkaline Phosphatase: 67 U/L (ref 38–126)
Anion gap: 15 (ref 5–15)
BUN: 116 mg/dL — ABNORMAL HIGH (ref 8–23)
CO2: 13 mmol/L — ABNORMAL LOW (ref 22–32)
Calcium: 8.1 mg/dL — ABNORMAL LOW (ref 8.9–10.3)
Chloride: 112 mmol/L — ABNORMAL HIGH (ref 98–111)
Creatinine, Ser: 6.45 mg/dL — ABNORMAL HIGH (ref 0.61–1.24)
GFR, Estimated: 8 mL/min — ABNORMAL LOW (ref >60)
Glucose, Bld: 96 mg/dL (ref 70–99)
Potassium: 5.3 mmol/L — ABNORMAL HIGH (ref 3.5–5.1)
Sodium: 140 mmol/L (ref 135–145)
Total Bilirubin: 0.9 mg/dL (ref 0.3–1.2)
Total Protein: 5.4 g/dL — ABNORMAL LOW (ref 6.5–8.1)

## 2022-09-14 LAB — PROTIME-INR
INR: 1.3 — ABNORMAL HIGH (ref 0.8–1.2)
Prothrombin Time: 16.5 seconds — ABNORMAL HIGH (ref 11.4–15.2)

## 2022-09-14 LAB — I-STAT VENOUS BLOOD GAS, ED
Acid-base deficit: 14 mmol/L — ABNORMAL HIGH (ref 0.0–2.0)
Bicarbonate: 12.6 mmol/L — ABNORMAL LOW (ref 20.0–28.0)
Calcium, Ion: 1.2 mmol/L (ref 1.15–1.40)
HCT: 25 % — ABNORMAL LOW (ref 39.0–52.0)
Hemoglobin: 8.5 g/dL — ABNORMAL LOW (ref 13.0–17.0)
O2 Saturation: 76 %
Potassium: 5.4 mmol/L — ABNORMAL HIGH (ref 3.5–5.1)
Sodium: 142 mmol/L (ref 135–145)
TCO2: 14 mmol/L — ABNORMAL LOW (ref 22–32)
pCO2, Ven: 32.2 mmHg — ABNORMAL LOW (ref 44–60)
pH, Ven: 7.2 — ABNORMAL LOW (ref 7.25–7.43)
pO2, Ven: 49 mmHg — ABNORMAL HIGH (ref 32–45)

## 2022-09-14 LAB — SODIUM, URINE, RANDOM: Sodium, Ur: 85 mmol/L

## 2022-09-14 LAB — VITAMIN B12: Vitamin B-12: 2290 pg/mL — ABNORMAL HIGH (ref 180–914)

## 2022-09-14 LAB — FOLATE: Folate: 9 ng/mL (ref >5.9)

## 2022-09-14 LAB — LACTIC ACID, PLASMA
Lactic Acid, Venous: 0.8 mmol/L (ref 0.5–1.9)
Lactic Acid, Venous: 1.6 mmol/L (ref 0.5–1.9)

## 2022-09-14 LAB — PHOSPHORUS: Phosphorus: 4.8 mg/dL — ABNORMAL HIGH (ref 2.5–4.6)

## 2022-09-14 MED ORDER — MAGNESIUM SULFATE 2 GM/50ML IV SOLN
2.0000 g | Freq: Once | INTRAVENOUS | Status: AC
  Administered 2022-09-14: 2 g via INTRAVENOUS
  Filled 2022-09-14: qty 50

## 2022-09-14 MED ORDER — MELATONIN 3 MG PO TABS
3.0000 mg | ORAL_TABLET | Freq: Every evening | ORAL | Status: DC | PRN
  Administered 2022-09-17 – 2022-09-18 (×3): 3 mg via ORAL
  Filled 2022-09-14 (×3): qty 1

## 2022-09-14 MED ORDER — LACTATED RINGERS IV BOLUS
1000.0000 mL | Freq: Once | INTRAVENOUS | Status: AC
  Administered 2022-09-14: 1000 mL via INTRAVENOUS

## 2022-09-14 MED ORDER — SIMVASTATIN 20 MG PO TABS
20.0000 mg | ORAL_TABLET | Freq: Every day | ORAL | Status: DC
  Administered 2022-09-15 – 2022-09-19 (×5): 20 mg via ORAL
  Filled 2022-09-14 (×5): qty 1

## 2022-09-14 MED ORDER — ONDANSETRON HCL 4 MG/2ML IJ SOLN
4.0000 mg | Freq: Four times a day (QID) | INTRAMUSCULAR | Status: DC | PRN
  Administered 2022-09-16 – 2022-09-17 (×5): 4 mg via INTRAVENOUS
  Filled 2022-09-14 (×5): qty 2

## 2022-09-14 MED ORDER — CALCIUM GLUCONATE 10 % IV SOLN
1.0000 g | Freq: Once | INTRAVENOUS | Status: AC
  Administered 2022-09-14: 1 g via INTRAVENOUS
  Filled 2022-09-14: qty 10

## 2022-09-14 MED ORDER — INSULIN ASPART 100 UNIT/ML IV SOLN
5.0000 [IU] | Freq: Once | INTRAVENOUS | Status: AC
  Administered 2022-09-14: 5 [IU] via INTRAVENOUS

## 2022-09-14 MED ORDER — SODIUM ZIRCONIUM CYCLOSILICATE 10 G PO PACK
10.0000 g | PACK | Freq: Once | ORAL | Status: AC
  Administered 2022-09-14: 10 g via ORAL
  Filled 2022-09-14: qty 1

## 2022-09-14 MED ORDER — LEVOTHYROXINE SODIUM 75 MCG PO TABS
150.0000 ug | ORAL_TABLET | Freq: Every day | ORAL | Status: DC
  Administered 2022-09-15 – 2022-09-20 (×6): 150 ug via ORAL
  Filled 2022-09-14 (×6): qty 2

## 2022-09-14 MED ORDER — LACTATED RINGERS IV SOLN: INTRAVENOUS | Status: DC

## 2022-09-14 MED ORDER — DEXTROSE 50 % IV SOLN
1.0000 | Freq: Once | INTRAVENOUS | Status: AC
  Administered 2022-09-14: 50 mL via INTRAVENOUS
  Filled 2022-09-14: qty 50

## 2022-09-14 MED ORDER — PANTOPRAZOLE SODIUM 40 MG IV SOLR
40.0000 mg | Freq: Once | INTRAVENOUS | Status: AC
  Administered 2022-09-14: 40 mg via INTRAVENOUS
  Filled 2022-09-14: qty 10

## 2022-09-14 MED ORDER — TAMSULOSIN HCL 0.4 MG PO CAPS
0.4000 mg | ORAL_CAPSULE | Freq: Two times a day (BID) | ORAL | Status: DC
  Administered 2022-09-15 – 2022-09-20 (×10): 0.4 mg via ORAL
  Filled 2022-09-14 (×11): qty 1

## 2022-09-14 MED ORDER — ACETAMINOPHEN 325 MG PO TABS
650.0000 mg | ORAL_TABLET | Freq: Four times a day (QID) | ORAL | Status: DC | PRN
  Administered 2022-09-17: 650 mg via ORAL
  Filled 2022-09-14: qty 2

## 2022-09-14 MED ORDER — ACETAMINOPHEN 650 MG RE SUPP: 650.0000 mg | Freq: Four times a day (QID) | RECTAL | Status: DC | PRN

## 2022-09-14 NOTE — H&P (Signed)
History and Physical      [name] ZOX:[id number] DOB: [birth year] DOA: 09/14/2022; DOS: 09/14/2022  PCP: Gilmore Laroche, FNP  Patient coming from: home   I have personally briefly reviewed patient's old medical records in [facility]  Chief Complaint: Nausea, vomiting, diarrhea  HPI: [name] is a 77 y.o. male with medical history significant for acquired hypothyroidism, chronic urinary tract infection on chronic Bactrim therapy, BPH, essential hypertension, hyperlipidemia, chronic diastolic heart failure, anemia of chronic disease associated baseline hemoglobin 9-12, who is admitted to [facility] on 09/14/2022 via ED to ED transfer from [facility] with acute renal failure after presenting from home to the latter facility complaining of nausea, vomiting, diarrhea.  The following history is obtained via the patient, my discussions with the EDP, and via chart review.  The patient reports that he has been experiencing persistent nausea, vomiting, diarrhea over the course the last 5 weeks.  For that timeframe, he notes that he has been vomiting at least 1 time a day, but denies any associated hematemesis or gross or associated coffee-ground emesis.  Additionally, over the timeframe, he notes at least 2-3 daily episodes of nonbloody loose stool.  Particularly over the last 2 to 3 weeks, he notes significant decline in oral intake as a consequence of the persistence of his nausea/vomiting.  This has not been associate with any abdominal discomfort.  Patient denies any associated melena, or hematochezia.  No recent traveling.  He also denies any associated subjective fever, chills, rigors, or generalized myalgias.  In the setting of his nausea/vomiting/diarrhea, he originally presented to [facility] emergency department on 08/01/2022.  Labs performed at that time were notable for creatinine 1.17 as well as hemoglobin of 12.3.  He received 1 L of IV fluids as well as IV  antiemetics, and following ensuing improvement in his nausea/vomiting, was discharged to home from the emergency department.  However, following discharge to home from [facility] emergency department on 08/01/2022, he notes no significant ensuing improvement in his nausea, vomiting, diarrhea, prompting him to present to the [facility] emergency department on the morning of 07/15/2022.  He denies any recent chest pain, shortness of breath, palpitations, diaphoresis, dizziness, presyncope, or syncope.  He acknowledges a history of chronic urinary tract infection for which she is on chronic Bactrim therapy.  Relative to this baseline, he denies any recent dysuria, gross hematuria.  He notes that he continues to produce urine.   At [facility] ED this morning, his initial systolic blood pressures were noted to be in the 80s, with laboratory evaluation notable for potassium 6.6, BUN 158 compared to 34 on 08/01/2022, creatinine 11.4, and hemoglobin 8.  Per chart review, he appears to have a baseline hemoglobin ranges from 9-12.  In the setting of interval decline in hemoglobin along with borderline initial hypotension the patient received 1 unit PRBC, following which.  Blood pressures improved to the 90s, with ensuing hemoglobin increasing from 8-9.6.  He also received IV fluids , with repeat BMP demonstrating interval decrease in creatinine from 11.4 to 9 as well as interval decrease in potassium level from 6.6 to 6.0.  This interval decrease in potassium occurred in the absence of any Lokelma or Kayexalate.  He subsequently received a dose of Lokelma prior to transfer.  His medical history is notable for documentation of BPH for which he is on Flomax.  He also has a history of essential pretension for which he is on benazepril.  Per  chart review, his most recent echocardiogram was performed in October 2020 and was notable for LVEF 60 to 65% with evidence of diastolic parameters associated with impaired  relaxation.  Not on any dedicated diuretic medications as an outpatient aside from his potassium sparing diuretic in the form of Bactrim.  He was subsequently transferred from [facility] ED to [facility] ED for further evaluation management of the above.     [facility] ED Course:  Vital signs in the ED were notable for the following: Afebrile; initial heart rates in the low 100s, socially decreasing into the 80s following interval IV fluids, as further quantified below; stop blood pressures in the 90s to 1 teens; respiratory rate 15-20, and oxygen saturation 98 to 100% on room air.  Labs were notable for the following: CMP was notable for the following: Sodium 140, potassium 5.3, chloride 112, bicarbonate 13, anion gap 15, BUN 16, creatinine 6.45, glucose 96, calcium, adjusted for mild hypoalbuminemia 9.0, albumin 9.8, and liver enzymes were otherwise within normal limits.  Serum magnesium level 1.1.  CBC notable for white cell count 4700, hemoglobin 8.8 associated normocytic/normochromic properties as well as nonelevated RDW.  Lactic acid 0.8.  Type and screen ordered.  INR 1.3.  DRE performed by EDP revealed brown-colored stool in the absence of any melena or bright red blood.  Fecal occult blood test initiated, with result currently pending.  Per my interpretation, EKG in ED demonstrated the following: Sinus rhythm with heart rate 94, normal intervals, no evidence of T wave or ST changes, including no evidence of ST elevation.  While in the ED, the following were administered: Calcium gluconate 1 g IV x 1, NovoLog 5 units IV x 1, amp of D50 x 1, lactated Ringer's as other bolus followed by initiation continuous LR running at 100 cc/h.  Magnesium sulfate 2 g IV over 2 hours.  Subsequently, the patient was admitted for further evaluation management of presenting acute renal failure, with presenting labs also notable for mild hyperkalemia, hypomagnesemia, acute on chronic anemia, after presenting with  complaint of 5 weeks of persistent nausea, vomiting, and diarrhea.     Review of Systems: As per HPI otherwise 10 point review of systems negative.   Past Medical History:  Diagnosis Date   Arthritis    Bronchitis    hx of   COPD (chronic obstructive pulmonary disease) (HCC)    GERD (gastroesophageal reflux disease)    History of kidney stones    Hypertension    Hypothyroidism    Macular degeneration    right eye   Nausea vomiting and diarrhea 09/14/2022   Restless leg syndrome     Past Surgical History:  Procedure Laterality Date   BACK SURGERY     Fusion 2005   Graves Disease     JOINT REPLACEMENT  1970   Right knee    LAMINECTOMY WITH POSTERIOR LATERAL ARTHRODESIS LEVEL 2 N/A 06/29/2021   Procedure: Laminectomy - Lumbar one-two, Lumbar two-three - redo with posterior lateral fusion with pedicle screws;  Surgeon: Julio Sicks, MD;  Location: [facility] OR;  Service: Neurosurgery;  Laterality: N/A;   NECK SURGERY     POSTERIOR CERVICAL FUSION/FORAMINOTOMY  12/05/2011   Procedure: POSTERIOR CERVICAL FUSION/FORAMINOTOMY LEVEL 4;  Surgeon: Reinaldo Meeker, MD;  Location: [facility] NEURO ORS;  Service: Neurosurgery;  Laterality: N/A;  Posterior Cervical fixation Cervical four-Seven,Cervical Decompresson Cervical three   THORACIC DISCECTOMY N/A 01/08/2021   Procedure: Thoracic One-Two, Thoracic Two-Three, Thoracic Eleven-Twelve, Thoracic Twelve-Lumbar One Laminectomy;  Surgeon: Julio Sicks, MD;  Location: [facility] OR;  Service: Neurosurgery;  Laterality: N/A;    Social History:  reports that he has been smoking cigarettes. He has a 12.50 pack-year smoking history. He has never used smokeless tobacco. He reports current drug use. He reports that he does not drink alcohol.   Allergies  Allergen Reactions   Sulfa Antibiotics Other (See Comments)    Causes blisters and skin to peel from inside mouth    Family History  Problem Relation Age of Onset   Heart attack Mother 65   Heart disease Father      Family history reviewed and not pertinent    Prior to Admission medications   Medication Sig Start Date End Date Taking? Authorizing Provider  aspirin 81 MG chewable tablet Chew 1 tablet (81 mg total) by mouth daily. 09/04/21   Gilmore Laroche, FNP  benazepril (LOTENSIN) 20 MG tablet TAKE 1 TABLET BY MOUTH EVERY DAY 08/05/22   Gilmore Laroche, FNP  Cholecalciferol (VITAMIN D3) 1.25 MG (50000 UT) TABS Take 50,000 Units by mouth every Wednesday.    [provider]  DULoxetine (CYMBALTA) 60 MG capsule TAKE 1 CAPSULE BY MOUTH DAILY 08/28/22   Gilmore Laroche, FNP  gabapentin (NEURONTIN) 300 MG capsule TAKE ONE CAPSULE BY MOUTH THREE TIMES DAILY 08/28/22   Gilmore Laroche, FNP  levothyroxine (SYNTHROID) 150 MCG tablet TAKE 1 TABLET BY MOUTH DAILY BEFORE BREAKFAST 02/04/22   Gilmore Laroche, FNP  loperamide (IMODIUM) 2 MG capsule TAKE ONE CAPSULE BY MOUTH WITH each loose stool AS NEEDED (max of 5 doses in 24 hours) 08/08/22   Gilmore Laroche, FNP  prochlorperazine (COMPAZINE) 5 MG tablet Take 1 tablet (5 mg total) by mouth every 6 (six) hours as needed for nausea or vomiting. 08/01/22   Harris, Cammy Copa, PA-C  simvastatin (ZOCOR) 20 MG tablet TAKE 1 TABLET BY MOUTH AT BEDTIME 07/02/22   Gilmore Laroche, FNP  tamsulosin (FLOMAX) 0.4 MG CAPS capsule Take 1 capsule (0.4 mg total) by mouth in the morning and at bedtime. 09/20/21   Gilmore Laroche, FNP  triamcinolone cream (KENALOG) 0.1 % Apply topically 2 (two) times daily. to affected area(s) 04/04/22   Gilmore Laroche, FNP  trimethoprim (TRIMPEX) 100 MG tablet TAKE 1 TABLET BY MOUTH AT BEDTIME 08/29/22   [name], [name], MD  UNABLE TO FIND Take 1 tablet by mouth 2 (two) times daily. Med Name: preserve vision (areds 2)    [provider]  vitamin B-12 (CYANOCOBALAMIN) 1000 MCG tablet Take 1,000 mcg by mouth daily.    [provider]     Objective    Physical Exam: Vitals:   09/14/22 1845 09/14/22 1900 09/14/22 1930  09/14/22 1944  BP: (!) 113/57 (!) 91/55 (!) 92/49 (!) 92/49  Pulse:    83  Resp: 16 13 20 17   Temp:    97.9 F (36.6 C)  TempSrc:    Oral  SpO2:    98%  Weight:      Height:        General: appears to be stated age; alert, oriented Skin: warm, dry, no rash Head:  AT/[place] Mouth:  Oral mucosa membranes appear dry, normal dentition Neck: supple; trachea midline Heart:  RRR; did not appreciate any M/R/G Lungs: CTAB, did not appreciate any wheezes, rales, or rhonchi Abdomen: + BS; soft, ND, NT Vascular: 2+ pedal pulses b/l; 2+ radial pulses b/l Extremities: no peripheral edema, no muscle wasting Neuro: strength and sensation intact in upper and lower extremities b/l  Labs on Admission: I have personally reviewed following labs and imaging studies  CBC: Recent Labs  Lab 09/14/22 1740 09/14/22 1751 09/14/22 1752  WBC 4.7  --   --   NEUTROABS 3.9  --   --   HGB 8.8* 8.5* 8.2*  HCT 27.4* 25.0* 24.0*  MCV 97.2  --   --   PLT 92*  --   --    Basic Metabolic Panel: Recent Labs  Lab 09/14/22 1740 09/14/22 1751 09/14/22 1752  NA 140 142 143  K 5.3* 5.4* 5.4*  CL 112*  --  116*  CO2 13*  --   --   GLUCOSE 96  --  91  BUN 116*  --  >130*  CREATININE 6.45*  --  7.20*  CALCIUM 8.1*  --   --   MG 1.1*  --   --   PHOS 4.8*  --   --    GFR: Estimated Creatinine Clearance: 11.9 mL/min (A) (by C-G formula based on SCr of 7.2 mg/dL (H)). Liver Function Tests: Recent Labs  Lab 09/14/22 1740  AST 9*  ALT 7  ALKPHOS 67  BILITOT 0.9  PROT 5.4*  ALBUMIN 2.8*   No results for input(s): "LIPASE", "AMYLASE" in the last 168 hours. No results for input(s): "AMMONIA" in the last 168 hours. Coagulation Profile: Recent Labs  Lab 09/14/22 1740  INR 1.3*   Cardiac Enzymes: No results for input(s): "CKTOTAL", "CKMB", "CKMBINDEX", "TROPONINI" in the last 168 hours. BNP (last 3 results) No results for input(s): "PROBNP" in the last 8760 hours. HbA1C: No results for input(s):  "HGBA1C" in the last 72 hours. CBG: No results for input(s): "GLUCAP" in the last 168 hours. Lipid Profile: No results for input(s): "CHOL", "HDL", "LDLCALC", "TRIG", "CHOLHDL", "LDLDIRECT" in the last 72 hours. Thyroid Function Tests: No results for input(s): "TSH", "T4TOTAL", "FREET4", "T3FREE", "THYROIDAB" in the last 72 hours. Anemia Panel: No results for input(s): "VITAMINB12", "FOLATE", "FERRITIN", "TIBC", "IRON", "RETICCTPCT" in the last 72 hours. Urine analysis:    Component Value Date/Time   COLORURINE YELLOW 09/14/2022 1723   APPEARANCEUR TURBID (A) 09/14/2022 1723   APPEARANCEUR Hazy (A) 06/11/2021 1122   LABSPEC 1.009 09/14/2022 1723   PHURINE 5.0 09/14/2022 1723   GLUCOSEU NEGATIVE 09/14/2022 1723   HGBUR SMALL (A) 09/14/2022 1723   BILIRUBINUR NEGATIVE 09/14/2022 1723   BILIRUBINUR negative 07/09/2022 1656   BILIRUBINUR Negative 06/11/2021 1122   KETONESUR NEGATIVE 09/14/2022 1723   PROTEINUR 30 (A) 09/14/2022 1723   UROBILINOGEN 0.2 07/09/2022 1656   NITRITE NEGATIVE 09/14/2022 1723   LEUKOCYTESUR LARGE (A) 09/14/2022 1723    Radiological Exams on Admission: No results found.    Assessment/Plan   Principal Problem:   ARF (acute renal failure) (HCC) Active Problems:   Essential hypertension   BPH (benign prostatic hyperplasia)   Acquired hypothyroidism   Hypomagnesemia   Hyperkalemia   Acute on chronic anemia   Nausea vomiting and diarrhea   Hyperlipidemia   Chronic diastolic CHF (congestive heart failure) (HCC)     #) Acute renal failure: Relative to creatinine of 1.17 on 08/01/2022, the patient presented to [facility] earlier this morning with laboratory evaluation that included creatinine of 11.4.  This is subsequently improved down to 6.45 with interval IV fluids.  Suspect that this is prerenal in nature on the basis of significant dehydration given persistent nausea, vomiting, diarrhea over the course the last 5 weeks Compounded by diminished  compensatory intake as a consequence of limited ability  to tolerate p.o. over that timeframe given associated nausea/vomiting.  This element of significant dehydration is superimposed on pharmacologic factors predisposing him to develop acute kidney injury, including his outpatient potassium sparing diuretic in the form of chronic daily Bactrim, as well as use of benazepril.  Will continue to pursue intravascular rotation efforts to address his dehydration, with close monitoring of ensuing renal function as further outlined below.    It is also noted that he has a history of BPH, raising the possibility of an obstructive contribution towards his presenting acute kidney injury.  However, the significant improvement in his renal function with IV fluids alone is reassuring from the standpoint of postrenal obstructive contributions.  Will continue his outpatient Flomax, and also pursue renal ultrasound to further evaluate.  His presenting hyperkalemia is now significantly improved, down to 5.3, and presentation is not associate with any overt volume overload nor any evidence of anion gap metabolic acidosis.   Plan: monitor strict I's & O's and daily weights. Attempt to avoid nephrotoxic agents.  Hold home gabapentin, benazepril, and Bactrim. refrain from NSAIDs.  Follow for result of repeat BMP ordered at midnight, primarily to further trend mildly elevated potassium level.  Repeat CMP in the morning.  Repeat serum magnesium level in the morning.  Check urinalysis with microscopy.  Add-on random urine sodium and random urine creatinine.  In the context of outpatient statin use, will also add on CPK level.  Continuous lactated Ringer's at 100 cc/h.  Renal ultrasound ordered.              #) Hyperkalemia: Initial labs at [facility] reflected potassium level of 6.6, which decreased to 6.0 with interval IV fluids alone.  He subsequently received a dose of Lokelma, following which his presenting serum  potassium level at [facility] emergency department reflects further decline to 5.3, following which he then also received calcium gluconate, IV NovoLog.   This appears to be multifactorial in etiology, including contributions from his presenting acute renal failure, dehydration, as well as from multiple pharmacologic factors that include outpatient use of Bactrim serving as a potassium sparing diuretic, in addition to contributions from outpatient benazepril.  Plan: Will provide a dose of Lokelma at this time, while also pursuing additional IV fluids, and repeat BMP at midnight to further trend.  Monitor on symmetry.  Hold home Bactrim, benazepril.  Repeat CMP in the morning.  Monitor on telemetry.  Further evaluation management of his presenting acute renal failure, as outlined above.             #) Hypomagnesemia: Present serum magnesium level 1.1 in the setting of 5 weeks of reported persistent GI losses in the form of nausea, vomiting, diarrhea over that timeframe.  He is in the process of receiving 2 g of IV magnesium sulfate in the ED.  Plan: Continue with existing IV magnesium supplementation, as above, with repeat magnesium level ordered for the morning.  Monitor on telemetry.              #) Acute on chronic anemia: In the context of a documented history of anemia of chronic disease associated with baseline hemoglobin range of 9-12, his initial hemoglobin performed at [facility] was slightly lower than his baseline, noted to be 8.  Suspect contribution from interval decline in renal function given corresponding decline in hemoglobin level documented on 08/01/2022.  The patient denies any symptoms of acute GI bleeding, including no recent melena, hematochezia, or any recent hematemesis, while  DRE performed by EDP showed brown-colored stool, while fecal occult blood test has been initiated, with result currently pending.  He did receive a transfusion of 1 unit PRBC at [facility], following which ensuing trend in hemoglobin, now 8.8, appears reassuring given concomitant administration of IV fluids over that timeframe.  Given no overt evidence of active GI bleed at this time, with alternate explanation for his interval decline in hemoglobin corresponding to interval decline in renal function, will refrain from GI consultation at this time, but closely monitor ensuing clinical status, hemoglobin trend, and follow for result of fecal occult blood test.  On daily baby aspirin as an outpatient, but otherwise on no blood thinners at home.  No known history of liver disease, and denies any history of alcohol abuse.  Type and screen has been ordered.  Will add on additional laboratory studies for his acute on chronic anemia, but with the caveat, they he has already received transfusion of 1 unit PRBC at [facility], potentially altering the results of the studies.  Plan: Every 4 hours H&H's have been ordered through 5 AM on 09/15/2022.  Holding home daily baby aspirin for now.  Add on iron studies, B12 level, LEVEL, reticulocyte count.  Repeat INR in the morning.  Check PTT.  Follow-up result of fecal occult blood testing.             #) Nausea, vomiting, diarrhea: 5 weeks of persistent symptoms, patient denying any associated hematemesis, melena, or hematochezia.  Not associate any abdominal discomfort.  Etiology at this time is not entirely clear, but it appears that the persistence of the symptoms have resulted in significant dehydration with resultant contributions towards his presenting acute renal failure.  He has no leukocytosis to suggest underlying C. Difficile.  Will evaluate for underlying infectious source, as outlined below.  Pharmacologic potential treating factors include outpatient use of Cymbalta.  Also check TSH level in the context of a documented history of acquired hypothyroidism for which he is on supplemental thyroid hormone.  Plan: Check GI  panel by PCR, as above.  Hold him Cymbalta for now.  Check TSH.  IV fluids, as above.  Further evaluation management of presenting hypomagnesemia, as above.  CMP, CBC in the morning.  Monitor strict I's and O's and daily weights.  Add on lipase.                #) acquired hypothyroidism: documented h/o such, on Synthroid as outpatient.   Plan: cont home Synthroid.  Check TSH, as above.                 #) Benign Prostatic Hyperplasia:  documented h/o such; on tamsulosin as outpatient.  Notable in the setting of his presenting acute renal failure, although renal function appears to be improving with IV fluids alone..   Plan: monitor strict I's & O's and daily weights. Repeat CMP in AM.  continue home tamsulosin.  Follow-up results of renal ultrasound ordered as a component of evaluation of his presenting acute renal failure.               #) Essential Hypertension: documented h/o such, with outpatient antihypertensive regimen including benazepril, which will be held in the setting of presenting acute renal failure.    Plan: Close monitoring of subsequent BP via routine VS. hold home benazepril for now.  Further evaluation management of presenting acute renal failure, as outlined above.               #)  Hyperlipidemia: documented h/o such. On simvastatin as outpatient.  In the setting of concomitant acute renal failure, will also add on CPK level.  Plan: continue home statin.  Follow for result of CPK level, as above.                #) Chronic diastolic heart failure: documented history of such, with most recent echocardiogram performed in October 2020, which was notable for LVEF 60 to 65% as well as diastolic parameters to demonstrate impaired relaxation. No clinical evidence to suggest acutely decompensated heart failure at this time, however, will closely monitor ensuing volume status given plan for additional IV fluids in the  context of clinical evidence of presenting dehydration. home diuretic regimen reportedly consists of the following: None, aside from the potassium sparing diuretic contributions posed by his chronic use of Bactrim.   Plan: monitor strict I's & O's and daily weights. Repeat CMP in AM. Check serum mag level.      DVT prophylaxis: SCD's   Code Status: Full code Family Communication: none Disposition Plan: Per Rounding Team Consults called: none;  Admission status: inpatient     I SPENT GREATER THAN 75  MINUTES IN CLINICAL CARE TIME/MEDICAL DECISION-MAKING IN COMPLETING THIS ADMISSION.      Chaney Born Yumiko Alkins DO [facility]  From 7PM - 7AM   09/14/2022, 8:31 PM

## 2022-09-14 NOTE — ED Provider Notes (Signed)
[place] EMERGENCY DEPARTMENT AT [facility] Provider Note   CSN: [id number] Arrival date & time: 09/14/22  1652     History  Chief Complaint  Patient presents with   GI Bleeding    [name] is a 77 y.o. male.  HPI 77 year old male with history of COPD, GERD, hypertension, nephrolithiasis, hypothyroidism.  Patient reportedly presented to [facility] emergency department yesterday.  He initially had nausea, vomiting, diarrhea.  He was found to have GI bleeding there.  He briefly became hypotensive and required a unit of blood.  CBC there was notable for hemoglobin of 8.  His CMP was concerning for acute renal failure with creatinine of 11, potassium 6.6, BUN 158, bicarb less than 12.5.  He had an anion gap metabolic acidosis.  BNP was elevated at 340.  COVID and flu are negative.  Patient was supposed to be directly admitted to the floor.  However in route he became hypotensive.  He got 500 cc of fluid with improvement.  He was redirected to the ED due to concern that he may need an ICU bed.  Patient reports he went to the ED there due to your low blood pressure with recent vomiting and diarrhea.  Denies chest pain, difficulty breathing, abdominal pain.  He is not aware of any blood in the stool.     Home Medications Prior to Admission medications   Medication Sig Start Date End Date Taking? Authorizing Provider  aspirin 81 MG chewable tablet Chew 1 tablet (81 mg total) by mouth daily. 09/04/21   Gilmore Laroche, FNP  benazepril (LOTENSIN) 20 MG tablet TAKE 1 TABLET BY MOUTH EVERY DAY 08/05/22   Gilmore Laroche, FNP  Cholecalciferol (VITAMIN D3) 1.25 MG (50000 UT) TABS Take 50,000 Units by mouth every Wednesday.    [provider]  DULoxetine (CYMBALTA) 60 MG capsule TAKE 1 CAPSULE BY MOUTH DAILY 08/28/22   Gilmore Laroche, FNP  gabapentin (NEURONTIN) 300 MG capsule TAKE ONE CAPSULE BY MOUTH THREE TIMES DAILY 08/28/22   Gilmore Laroche, FNP  levothyroxine  (SYNTHROID) 150 MCG tablet TAKE 1 TABLET BY MOUTH DAILY BEFORE BREAKFAST 02/04/22   Gilmore Laroche, FNP  loperamide (IMODIUM) 2 MG capsule TAKE ONE CAPSULE BY MOUTH WITH each loose stool AS NEEDED (max of 5 doses in 24 hours) 08/08/22   Gilmore Laroche, FNP  prochlorperazine (COMPAZINE) 5 MG tablet Take 1 tablet (5 mg total) by mouth every 6 (six) hours as needed for nausea or vomiting. 08/01/22   Harris, Cammy Copa, PA-C  simvastatin (ZOCOR) 20 MG tablet TAKE 1 TABLET BY MOUTH AT BEDTIME 07/02/22   Gilmore Laroche, FNP  tamsulosin (FLOMAX) 0.4 MG CAPS capsule Take 1 capsule (0.4 mg total) by mouth in the morning and at bedtime. 09/20/21   Gilmore Laroche, FNP  triamcinolone cream (KENALOG) 0.1 % Apply topically 2 (two) times daily. to affected area(s) 04/04/22   Gilmore Laroche, FNP  trimethoprim (TRIMPEX) 100 MG tablet TAKE 1 TABLET BY MOUTH AT BEDTIME 08/29/22   [name], [name], MD  UNABLE TO FIND Take 1 tablet by mouth 2 (two) times daily. Med Name: preserve vision (areds 2)    [provider]  vitamin B-12 (CYANOCOBALAMIN) 1000 MCG tablet Take 1,000 mcg by mouth daily.    [provider]      Allergies    Sulfa antibiotics    Review of Systems   Review of Systems  Gastrointestinal:  Positive for diarrhea, nausea and vomiting.  All other systems reviewed and are  negative.   Physical Exam Updated Vital Signs BP 117/67 (BP Location: Right Arm)   Pulse 95   Temp 97.6 F (36.4 C) (Oral)   Resp 19   Ht 6\' 7"  (2.007 m)   Wt 100.5 kg   SpO2 100%   BMI 24.96 kg/m  Physical Exam Vitals and nursing note reviewed. Exam conducted with a chaperone present.  Constitutional:      General: He is not in acute distress.    Appearance: He is well-developed.  HENT:     Head: Normocephalic and atraumatic.     Nose: Nose normal.     Mouth/Throat:     Mouth: Mucous membranes are dry.  Eyes:     Extraocular Movements: Extraocular movements intact.     Conjunctiva/sclera:  Conjunctivae normal.     Pupils: Pupils are equal, round, and reactive to light.  Cardiovascular:     Rate and Rhythm: Normal rate and regular rhythm.     Heart sounds: No murmur heard. Pulmonary:     Effort: Pulmonary effort is normal. No respiratory distress.     Breath sounds: Normal breath sounds.  Abdominal:     Palpations: Abdomen is soft.     Tenderness: There is no abdominal tenderness. There is no right CVA tenderness, left CVA tenderness, guarding or rebound.  Genitourinary:    Comments: Normal stool on rectal exam Musculoskeletal:        General: No swelling.     Cervical back: Neck supple.  Skin:    General: Skin is warm and dry.     Capillary Refill: Capillary refill takes less than 2 seconds.  Neurological:     General: No focal deficit present.     Mental Status: He is alert and oriented to person, place, and time. Mental status is at baseline.  Psychiatric:        Mood and Affect: Mood normal.     ED Results / Procedures / Treatments   Labs (all labs ordered are listed, but only abnormal results are displayed) Labs Reviewed  CBC WITH DIFFERENTIAL/PLATELET - Abnormal; Notable for the following components:      Result Value   RBC 2.82 (*)    Hemoglobin 8.8 (*)    HCT 27.4 (*)    Platelets 92 (*)    Lymphs Abs 0.3 (*)    All other components within normal limits  COMPREHENSIVE METABOLIC PANEL - Abnormal; Notable for the following components:   Potassium 5.3 (*)    Chloride 112 (*)    CO2 13 (*)    BUN 116 (*)    Creatinine, Ser 6.45 (*)    Calcium 8.1 (*)    Total Protein 5.4 (*)    Albumin 2.8 (*)    AST 9 (*)    GFR, Estimated 8 (*)    All other components within normal limits  MAGNESIUM - Abnormal; Notable for the following components:   Magnesium 1.1 (*)    All other components within normal limits  PHOSPHORUS - Abnormal; Notable for the following components:   Phosphorus 4.8 (*)    All other components within normal limits  PROTIME-INR -  Abnormal; Notable for the following components:   Prothrombin Time 16.5 (*)    INR 1.3 (*)    All other components within normal limits  URINALYSIS, ROUTINE W REFLEX MICROSCOPIC - Abnormal; Notable for the following components:   APPearance TURBID (*)    Hgb urine dipstick SMALL (*)    Protein, ur 30 (*)  Leukocytes,Ua LARGE (*)    Bacteria, UA MANY (*)    All other components within normal limits  VITAMIN B12 - Abnormal; Notable for the following components:   Vitamin B-12 2,290 (*)    All other components within normal limits  RETICULOCYTES - Abnormal; Notable for the following components:   RBC. 2.86 (*)    All other components within normal limits  I-STAT VENOUS BLOOD GAS, ED - Abnormal; Notable for the following components:   pH, Ven 7.200 (*)    pCO2, Ven 32.2 (*)    pO2, Ven 49 (*)    Bicarbonate 12.6 (*)    TCO2 14 (*)    Acid-base deficit 14.0 (*)    Potassium 5.4 (*)    HCT 25.0 (*)    Hemoglobin 8.5 (*)    All other components within normal limits  I-STAT CHEM 8, ED - Abnormal; Notable for the following components:   Potassium 5.4 (*)    Chloride 116 (*)    BUN >130 (*)    Creatinine, Ser 7.20 (*)    TCO2 15 (*)    Hemoglobin 8.2 (*)    HCT 24.0 (*)    All other components within normal limits  CULTURE, BLOOD (ROUTINE X 2)  CULTURE, BLOOD (ROUTINE X 2)  GASTROINTESTINAL PANEL BY PCR, STOOL (REPLACES STOOL CULTURE)  C DIFFICILE QUICK SCREEN W PCR REFLEX    LACTIC ACID, PLASMA  LACTIC ACID, PLASMA  TSH  FOLATE  CBC WITH DIFFERENTIAL/PLATELET  COMPREHENSIVE METABOLIC PANEL  MAGNESIUM  PHOSPHORUS  BASIC METABOLIC PANEL  URINALYSIS, COMPLETE (UACMP) WITH MICROSCOPIC  CREATININE, URINE, RANDOM  CK  SODIUM, URINE, RANDOM  FERRITIN  APTT  PROTIME-INR  IRON AND TIBC  HEMOGLOBIN AND HEMATOCRIT, BLOOD  LIPASE, BLOOD  POC OCCULT BLOOD, ED  TYPE AND SCREEN    EKG EKG Interpretation  Date/Time:  Saturday September 14 2022 17:09:36 EDT Ventricular Rate:   94 PR Interval:  50 QRS Duration: 97 QT Interval:  343 QTC Calculation: 429 R Axis:   92 Text Interpretation: Sinus rhythm Short PR interval Right axis deviation Confirmed by Eber Hong ([id number]) on 09/14/2022 5:51:43 PM  Radiology No results found.  Procedures Procedures    Medications Ordered in ED Medications  lactated ringers infusion ( Intravenous New Bag/Given 09/14/22 2227)  acetaminophen (TYLENOL) tablet 650 mg (has no administration in time range)    Or  acetaminophen (TYLENOL) suppository 650 mg (has no administration in time range)  melatonin tablet 3 mg (has no administration in time range)  ondansetron (ZOFRAN) injection 4 mg (has no administration in time range)  levothyroxine (SYNTHROID) tablet 150 mcg (has no administration in time range)  simvastatin (ZOCOR) tablet 20 mg (has no administration in time range)  tamsulosin (FLOMAX) capsule 0.4 mg (has no administration in time range)  pantoprazole (PROTONIX) injection 40 mg (40 mg Intravenous Given 09/14/22 1754)  calcium gluconate inj 10% (1 g) URGENT USE ONLY! (1 g Intravenous Given 09/14/22 1756)  insulin aspart (novoLOG) injection 5 Units (5 Units Intravenous Given 09/14/22 1801)    And  dextrose 50 % solution 50 mL (50 mLs Intravenous Given 09/14/22 1759)  lactated ringers bolus 1,000 mL (0 mLs Intravenous Stopped 09/14/22 1943)  magnesium sulfate IVPB 2 g 50 mL (0 g Intravenous Stopped 09/14/22 2038)  lactated ringers bolus 1,000 mL (0 mLs Intravenous Stopped 09/14/22 2220)  sodium zirconium cyclosilicate (LOKELMA) packet 10 g (10 g Oral Given 09/14/22 2227)    ED Course/ Medical Decision Making/  A&P Clinical Course as of 09/14/22 2306  Sat Sep 14, 2022  1722 EKG sinus rhythm, normal intervals, no ischemic changes but does have somewhat peaked T waves concerning for possible hyperkalemia. [JD]    Clinical Course User Index [JD] Fulton Reek, MD                             Medical Decision Making Amount and/or  Complexity of Data Reviewed Labs: ordered.  Risk OTC drugs. Prescription drug management. Decision regarding hospitalization.   78 year old male presenting via transfer from outside hospital for possible GI bleed.  Noted to be hypotensive and round so was read triaged to the ED.  Here he is awake and alert.  He has had recent vomiting and diarrhea and appears dry.  Reported hematochezia at outside hospital, the rectal exam here is normal.  Hemoccult is pending.  His abdominal exam here is benign had CT scan yesterday without acute abnormality.  His labs are concerning for significant anemia, hyperkalemia and renal failure likely prerenal in setting of vomiting and diarrhea.  Hemoglobin here is 8.8, similar to yesterday although decreased from a month ago where it was 12.3.  His CMP shows mild hyperkalemia, he does have some possible T wave peaking on EKG she was given calcium, insulin, dextrose.  Given additional fluids as well which improved his blood pressure.  He has significant AKI although improved, as well as significant uremia.  No emergent indication for dialysis at this point, his urinalysis is pending but he is still making good urine.  Lactic acid is normal.  Given concern for acute renal failure and possible GI bleeding, I discussed patient with the hospitalist and he was admitted for further management.        Final Clinical Impression(s) / ED Diagnoses Final diagnoses:  None    Rx / DC Orders ED Discharge Orders     None         Fulton Reek, MD 09/14/22 1610    Eber Hong, MD 09/16/22 1247

## 2022-09-14 NOTE — ED Triage Notes (Signed)
Pt BIB [facility] from [facility] due to him having a bed in [facility].  [facility] rejected and wants ICU bed. Until bed assigned to be looked over in the ED.  Pt has been having a active GI bleed for the past month and a half.  Per [facility] last hemoglobin 9.0 after 1 unit given in [facility]. VS BP 110/64, HR 90

## 2022-09-14 NOTE — ED Notes (Signed)
Pt wife Santina Evans called, she said she will be here tomorrow around lunch time.

## 2022-09-14 NOTE — ED Notes (Signed)
ED TO INPATIENT HANDOFF REPORT  ED Nurse Name and Phone #: Ponciano Ort RN [phone]  S Name/Age/Gender Colbert Coyer 77 y.o. male Room/Bed: 018C/018C  Code Status   Code Status: Full Code  Home/SNF/Other Home with wife Patient oriented to: self, place, time, and situation Is this baseline? Yes   Triage Complete: Triage complete  Chief Complaint ARF (acute renal failure) (HCC) [N17.9]  Triage Note Pt [facility] due to him having a bed [facility].  [facility] rejected and wants ICU bed. Until bed assigned to be looked over in the ED.  Pt has been having a active GI bleed for the past month and a half.  Per [facility] last hemoglobin 9.0 after 1 unit given in [facility]. VS BP 110/64, HR 90   Allergies Allergies  Allergen Reactions   Sulfa Antibiotics Other (See Comments)    Causes blisters and skin to peel from inside mouth    Level of Care/Admitting Diagnosis ED Disposition     ED Disposition  Admit   Condition  --   Comment  Hospital Area: [facility] [[id number]]  Level of Care: Progressive [102]  Admit to Progressive based on following criteria: MULTISYSTEM THREATS such as stable sepsis, metabolic/electrolyte imbalance with or without encephalopathy that is responding to early treatment.  May admit patient to [facility] or [facility] if equivalent level of care is available:: No  Covid Evaluation: Asymptomatic - no recent exposure (last 10 days) testing not required  Diagnosis: ARF (acute renal failure) [facility]) [[id number]]  Admitting Physician: Angie Fava [[id number]]  Attending Physician: Angie Fava [[id number]]  Certification:: I certify this patient will need inpatient services for at least 2 midnights  Estimated Length of Stay: 2          B Medical/Surgery History Past Medical History:  Diagnosis Date   Arthritis    Bronchitis    hx of   COPD (chronic obstructive pulmonary disease) (HCC)    GERD  (gastroesophageal reflux disease)    History of kidney stones    Hypertension    Hypothyroidism    Macular degeneration    right eye   Restless leg syndrome    Past Surgical History:  Procedure Laterality Date   BACK SURGERY     Fusion 2005   Graves Disease     JOINT REPLACEMENT  1970   Right knee    LAMINECTOMY WITH POSTERIOR LATERAL ARTHRODESIS LEVEL 2 N/A 06/29/2021   Procedure: Laminectomy - Lumbar one-two, Lumbar two-three - redo with posterior lateral fusion with pedicle screws;  Surgeon: Julio Sicks, MD;  Location: [facility] OR;  Service: Neurosurgery;  Laterality: N/A;   NECK SURGERY     POSTERIOR CERVICAL FUSION/FORAMINOTOMY  12/05/2011   Procedure: POSTERIOR CERVICAL FUSION/FORAMINOTOMY LEVEL 4;  Surgeon: Reinaldo Meeker, MD;  Location: [facility]UR[facility] OR[facility];  Service: Neurosurgery;  Laterality: N/A;  Posterior Cervical fixation Cervical four-Seven,Cervical Decompresson Cervical three   THORACIC DISCECTOMY N/A 01/08/2021   Procedure: Thoracic One-Two, Thoracic Two-Three, Thoracic Eleven-Twelve, Thoracic Twelve-Lumbar One Laminectomy;  Surgeon: Julio Sicks, MD;  Location: [facility] OR;  Service: Neurosurgery;  Laterality: N/A;     A IV Location/Drains/Wounds Patient Lines/Drains/Airways Status     Active Line/Drains/Airways     Name Placement date Placement time Site Days   Peripheral IV 09/14/22 20 G Anterior;Left Forearm 09/14/22  1754  Forearm  less than 1   Pressure Injury 07/17/21 Buttocks Right;Lower Deep Tissue Pressure Injury - Purple or  maroon localized area of discolored intact skin or blood-filled blister due to damage of underlying soft tissue from pressure and/or shear. 2x2 07/17/21  1807  -- 424            Intake/Output Last 24 hours  Intake/Output Summary (Last 24 hours) at 09/14/2022 1945 Last data filed at 09/14/2022 1943 Gross per 24 hour  Intake 999 ml  Output --  Net 999 ml    Labs/Imaging Results for orders placed or performed during the hospital encounter of  09/14/22 (from the past 48 hour(s))  CBC with Differential     Status: Abnormal   Collection Time: 09/14/22  5:40 PM  Result Value Ref Range   WBC 4.7 4.0 - 10.5 K/uL   RBC 2.82 (L) 4.22 - 5.81 MIL/uL   Hemoglobin 8.8 (L) 13.0 - 17.0 g/dL    Comment: REPEATED TO VERIFY   HCT 27.4 (L) 39.0 - 52.0 %   MCV 97.2 80.0 - 100.0 fL   MCH 31.2 26.0 - 34.0 pg   MCHC 32.1 30.0 - 36.0 g/dL   RDW 16.1 09.6 - 04.5 %   Platelets 92 (L) 150 - 400 K/uL    Comment: Immature Platelet Fraction may be clinically indicated, consider ordering this additional test [id number] REPEATED TO VERIFY PLATELET COUNT CONFIRMED BY SMEAR    nRBC 0.0 0.0 - 0.2 %   Neutrophils Relative % 84 %   Neutro Abs 3.9 1.7 - 7.7 K/uL   Lymphocytes Relative 7 %   Lymphs Abs 0.3 (L) 0.7 - 4.0 K/uL   Monocytes Relative 4 %   Monocytes Absolute 0.2 0.1 - 1.0 K/uL   Eosinophils Relative 4 %   Eosinophils Absolute 0.2 0.0 - 0.5 K/uL   Basophils Relative 0 %   Basophils Absolute 0.0 0.0 - 0.1 K/uL   Immature Granulocytes 1 %   Abs Immature Granulocytes 0.03 0.00 - 0.07 K/uL    Comment: Performed at [facility], [address] [place], [place] [address]  Comprehensive metabolic panel     Status: Abnormal   Collection Time: 09/14/22  5:40 PM  Result Value Ref Range   Sodium 140 135 - 145 mmol/L   Potassium 5.3 (H) 3.5 - 5.1 mmol/L   Chloride 112 (H) 98 - 111 mmol/L   CO2 13 (L) 22 - 32 mmol/L   Glucose, Bld 96 70 - 99 mg/dL    Comment: Glucose reference range applies only to samples taken after fasting for at least 8 hours.   BUN 116 (H) 8 - 23 mg/dL   Creatinine, Ser 5.62 (H) 0.61 - 1.24 mg/dL   Calcium 8.1 (L) 8.9 - 10.3 mg/dL   Total Protein 5.4 (L) 6.5 - 8.1 g/dL   Albumin 2.8 (L) 3.5 - 5.0 g/dL   AST 9 (L) 15 - 41 U/L   ALT 7 0 - 44 U/L   Alkaline Phosphatase 67 38 - 126 U/L   Total Bilirubin 0.9 0.3 - 1.2 mg/dL   GFR, Estimated 8 (L) >60 mL/min    Comment: (NOTE) Calculated using the CKD-EPI Creatinine  Equation (2021)    Anion gap 15 5 - 15    Comment: Performed at [facility], [address] [place], [place] [address]  Magnesium     Status: Abnormal   Collection Time: 09/14/22  5:40 PM  Result Value Ref Range   Magnesium 1.1 (L) 1.7 - 2.4 mg/dL    Comment: Performed at [facility],  [address], [place], [place] [address]  Phosphorus     Status: Abnormal   Collection Time: 09/14/22  5:40 PM  Result Value Ref Range   Phosphorus 4.8 (H) 2.5 - 4.6 mg/dL    Comment: Performed at [facility], [address] [place], [place] [address]  Type and screen [facility]     Status: None   Collection Time: 09/14/22  5:40 PM  Result Value Ref Range   ABO/RH(D) O POS    Antibody Screen NEG    Sample Expiration      09/17/2022,2359 Performed at [facility], [address] [place], [place] [address]   Protime-INR     Status: Abnormal   Collection Time: 09/14/22  5:40 PM  Result Value Ref Range   Prothrombin Time 16.5 (H) 11.4 - 15.2 seconds   INR 1.3 (H) 0.8 - 1.2    Comment: (NOTE) INR goal varies based on device and disease states. Performed at [facility], [address], [place], [place] [address]   Lactic acid, plasma     Status: None   Collection Time: 09/14/22  5:40 PM  Result Value Ref Range   Lactic Acid, Venous 0.8 0.5 - 1.9 mmol/L    Comment: Performed at [facility], [address], [place], [place] [address]  I-Stat venous blood gas, [facility], [facility], [facility])     Status: Abnormal   Collection Time: 09/14/22  5:51 PM  Result Value Ref Range   pH, Ven 7.200 (L) 7.25 - 7.43   pCO2, Ven 32.2 (L) 44 - 60 mmHg   pO2, Ven 49 (H) 32 - 45 mmHg   Bicarbonate 12.6 (L) 20.0 - 28.0 mmol/L   TCO2 14 (L) 22 - 32 mmol/L   O2 Saturation 76 %   Acid-base deficit 14.0 (H) 0.0 - 2.0 mmol/L   Sodium 142 135 - 145 mmol/L   Potassium 5.4 (H) 3.5 - 5.1 mmol/L   Calcium, Ion 1.20 1.15 - 1.40 mmol/L   HCT 25.0 (L) 39.0 - 52.0 %    Hemoglobin 8.5 (L) 13.0 - 17.0 g/dL   Sample type VENOUS   I-stat chem 8, ED (not at [facility], [facility] or [facility])     Status: Abnormal   Collection Time: 09/14/22  5:52 PM  Result Value Ref Range   Sodium 143 135 - 145 mmol/L   Potassium 5.4 (H) 3.5 - 5.1 mmol/L   Chloride 116 (H) 98 - 111 mmol/L   BUN >130 (H) 8 - 23 mg/dL   Creatinine, Ser 1.30 (H) 0.61 - 1.24 mg/dL   Glucose, Bld 91 70 - 99 mg/dL    Comment: Glucose reference range applies only to samples taken after fasting for at least 8 hours.   Calcium, Ion 1.20 1.15 - 1.40 mmol/L   TCO2 15 (L) 22 - 32 mmol/L   Hemoglobin 8.2 (L) 13.0 - 17.0 g/dL   HCT 86.5 (L) 78.4 - 69.6 %   No results found.  Pending Labs Unresulted Labs (From admission, onward)     Start     Ordered   09/15/22 0500  CBC with Differential/Platelet  Tomorrow morning,   R        09/14/22 1935   09/15/22 0500  Comprehensive metabolic panel  Tomorrow morning,   R        09/14/22 1935   09/15/22 0500  Magnesium  Tomorrow morning,   R  09/14/22 1935   09/15/22 0500  Phosphorus  Tomorrow morning,   R        09/14/22 1935   09/15/22 0001  Basic metabolic panel  Once-Timed,   TIMED        09/14/22 1936   09/14/22 1723  Urinalysis, Routine w reflex microscopic -Urine, Catheterized  Once,   URGENT       Question:  Specimen Source  Answer:  Urine, Catheterized   09/14/22 1722   09/14/22 1706  Blood culture (routine x 2)  BLOOD CULTURE X 2,   R (with STAT occurrences)      09/14/22 1706   09/14/22 1706  Lactic acid, plasma  Now then every 2 hours,   R (with STAT occurrences)      09/14/22 1706            Vitals/Pain Today's Vitals   09/14/22 1845 09/14/22 1900 09/14/22 1930 09/14/22 1944  BP: (!) 113/57 (!) 91/55 (!) 92/49 (!) 92/49  Pulse:    83  Resp: 16 13 20 17   Temp:    97.9 F (36.6 C)  TempSrc:    Oral  SpO2:    98%  Weight:      Height:      PainSc:    10-Worst pain ever    Isolation Precautions No active  isolations  Medications Medications  lactated ringers infusion ( Intravenous New Bag/Given 09/14/22 1941)  magnesium sulfate IVPB 2 g 50 mL (2 g Intravenous New Bag/Given 09/14/22 1938)  acetaminophen (TYLENOL) tablet 650 mg (has no administration in time range)    Or  acetaminophen (TYLENOL) suppository 650 mg (has no administration in time range)  melatonin tablet 3 mg (has no administration in time range)  ondansetron (ZOFRAN) injection 4 mg (has no administration in time range)  pantoprazole (PROTONIX) injection 40 mg (40 mg Intravenous Given 09/14/22 1754)  calcium gluconate inj 10% (1 g) URGENT USE ONLY! (1 g Intravenous Given 09/14/22 1756)  insulin aspart (novoLOG) injection 5 Units (5 Units Intravenous Given 09/14/22 1801)    And  dextrose 50 % solution 50 mL (50 mLs Intravenous Given 09/14/22 1759)  lactated ringers bolus 1,000 mL (0 mLs Intravenous Stopped 09/14/22 1943)    Mobility walks with device or wheelchair      Focused Assessments Pale skin, denies black stool. Has foley catheter. NSR on monitor   R Recommendations: See Admitting Provider Note  Report given to:   Additional Notes: wife Samara Deist will be visiting tomorrow around lunch

## 2022-09-15 ENCOUNTER — Inpatient Hospital Stay (HOSPITAL_COMMUNITY): Payer: 59

## 2022-09-15 DIAGNOSIS — N179 Acute kidney failure, unspecified: Secondary | ICD-10-CM | POA: Diagnosis not present

## 2022-09-15 LAB — COMPREHENSIVE METABOLIC PANEL
ALT: 7 U/L (ref 0–44)
AST: 11 U/L — ABNORMAL LOW (ref 15–41)
Albumin: 2.8 g/dL — ABNORMAL LOW (ref 3.5–5.0)
Alkaline Phosphatase: 67 U/L (ref 38–126)
Anion gap: 10 (ref 5–15)
BUN: 96 mg/dL — ABNORMAL HIGH (ref 8–23)
CO2: 16 mmol/L — ABNORMAL LOW (ref 22–32)
Calcium: 8.1 mg/dL — ABNORMAL LOW (ref 8.9–10.3)
Chloride: 113 mmol/L — ABNORMAL HIGH (ref 98–111)
Creatinine, Ser: 5.06 mg/dL — ABNORMAL HIGH (ref 0.61–1.24)
GFR, Estimated: 11 mL/min — ABNORMAL LOW (ref >60)
Glucose, Bld: 95 mg/dL (ref 70–99)
Potassium: 5 mmol/L (ref 3.5–5.1)
Sodium: 139 mmol/L (ref 135–145)
Total Bilirubin: 0.9 mg/dL (ref 0.3–1.2)
Total Protein: 5.5 g/dL — ABNORMAL LOW (ref 6.5–8.1)

## 2022-09-15 LAB — HEMOGLOBIN AND HEMATOCRIT, BLOOD
HCT: 28.2 % — ABNORMAL LOW (ref 39.0–52.0)
Hemoglobin: 8.9 g/dL — ABNORMAL LOW (ref 13.0–17.0)

## 2022-09-15 LAB — CBC WITH DIFFERENTIAL/PLATELET
Abs Immature Granulocytes: 0.03 10*3/uL (ref 0.00–0.07)
Basophils Absolute: 0 10*3/uL (ref 0.0–0.1)
Basophils Relative: 0 %
Eosinophils Absolute: 0.1 10*3/uL (ref 0.0–0.5)
Eosinophils Relative: 3 %
HCT: 28 % — ABNORMAL LOW (ref 39.0–52.0)
Hemoglobin: 9 g/dL — ABNORMAL LOW (ref 13.0–17.0)
Immature Granulocytes: 1 %
Lymphocytes Relative: 9 %
Lymphs Abs: 0.4 10*3/uL — ABNORMAL LOW (ref 0.7–4.0)
MCH: 30.2 pg (ref 26.0–34.0)
MCHC: 32.1 g/dL (ref 30.0–36.0)
MCV: 94 fL (ref 80.0–100.0)
Monocytes Absolute: 0.3 10*3/uL (ref 0.1–1.0)
Monocytes Relative: 6 %
Neutro Abs: 4.1 10*3/uL (ref 1.7–7.7)
Neutrophils Relative %: 81 %
Platelets: 97 10*3/uL — ABNORMAL LOW (ref 150–400)
RBC: 2.98 MIL/uL — ABNORMAL LOW (ref 4.22–5.81)
RDW: 14.4 % (ref 11.5–15.5)
WBC: 5 10*3/uL (ref 4.0–10.5)
nRBC: 0 % (ref 0.0–0.2)

## 2022-09-15 LAB — IRON AND TIBC
Iron: 112 ug/dL (ref 45–182)
Saturation Ratios: 75 % — ABNORMAL HIGH (ref 17.9–39.5)
TIBC: 150 ug/dL — ABNORMAL LOW (ref 250–450)
UIBC: 38 ug/dL

## 2022-09-15 LAB — BASIC METABOLIC PANEL
Anion gap: 10 (ref 5–15)
BUN: 98 mg/dL — ABNORMAL HIGH (ref 8–23)
CO2: 16 mmol/L — ABNORMAL LOW (ref 22–32)
Calcium: 8.1 mg/dL — ABNORMAL LOW (ref 8.9–10.3)
Chloride: 114 mmol/L — ABNORMAL HIGH (ref 98–111)
Creatinine, Ser: 5.19 mg/dL — ABNORMAL HIGH (ref 0.61–1.24)
GFR, Estimated: 11 mL/min — ABNORMAL LOW (ref >60)
Glucose, Bld: 93 mg/dL (ref 70–99)
Potassium: 5.3 mmol/L — ABNORMAL HIGH (ref 3.5–5.1)
Sodium: 140 mmol/L (ref 135–145)

## 2022-09-15 LAB — APTT: aPTT: 32 seconds (ref 24–36)

## 2022-09-15 LAB — AMMONIA: Ammonia: 17 umol/L (ref 9–35)

## 2022-09-15 LAB — CULTURE, BLOOD (ROUTINE X 2)

## 2022-09-15 LAB — LIPASE, BLOOD: Lipase: 375 U/L — ABNORMAL HIGH (ref 11–51)

## 2022-09-15 LAB — MAGNESIUM: Magnesium: 1.5 mg/dL — ABNORMAL LOW (ref 1.7–2.4)

## 2022-09-15 LAB — FERRITIN: Ferritin: 243 ng/mL (ref 24–336)

## 2022-09-15 LAB — PHOSPHORUS: Phosphorus: 4.2 mg/dL (ref 2.5–4.6)

## 2022-09-15 LAB — PROTIME-INR
INR: 1.1 (ref 0.8–1.2)
Prothrombin Time: 14.7 seconds (ref 11.4–15.2)

## 2022-09-15 LAB — CK: Total CK: 156 U/L (ref 49–397)

## 2022-09-15 MED ORDER — MAGNESIUM SULFATE 2 GM/50ML IV SOLN
2.0000 g | Freq: Once | INTRAVENOUS | Status: AC
  Administered 2022-09-15: 2 g via INTRAVENOUS
  Filled 2022-09-15: qty 50

## 2022-09-15 MED ORDER — STERILE WATER FOR INJECTION IV SOLN
INTRAVENOUS | Status: DC
  Filled 2022-09-15 (×5): qty 1000

## 2022-09-15 MED ORDER — SODIUM CHLORIDE 0.9 % IV SOLN
1.0000 g | Freq: Every day | INTRAVENOUS | Status: DC
  Administered 2022-09-15 – 2022-09-16 (×2): 1 g via INTRAVENOUS
  Filled 2022-09-15 (×2): qty 10

## 2022-09-15 MED ORDER — CHLORHEXIDINE GLUCONATE CLOTH 2 % EX PADS
6.0000 | MEDICATED_PAD | Freq: Every day | CUTANEOUS | Status: DC
  Administered 2022-09-16 – 2022-09-20 (×5): 6 via TOPICAL

## 2022-09-15 NOTE — Plan of Care (Signed)

## 2022-09-15 NOTE — Progress Notes (Signed)
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  Decreased oral intake.  On presentation, he was hypotensive, lab work showed creatinine of 11.4, hemoglobin of 8.  Patient was also given a unit of PRBC at [facility].  Patient admitted for further management.  Assessment & Plan:  Principal Problem:   ARF (acute renal failure) (HCC) Active Problems:   Essential hypertension   BPH (benign prostatic hyperplasia)   Acquired hypothyroidism   Hypomagnesemia   Hyperkalemia   Acute on chronic anemia   Nausea vomiting and diarrhea   Hyperlipidemia   Chronic diastolic CHF (congestive heart failure) (HCC)  AKI/hyperkalemia: Previous creatinine was 1.1 as per 07/31/2022.  Presented with creatinine in the range of 11.  This is most likely prerenal AKI because of dehydration from vomiting and diarrhea.  Ultrasound of the kidneys did not show any hydronephrosis but showed bilateral simple renal cyst.  Kidney function expected to recover with IV fluids.  Continue IV fluids with bicarb. Patient also takes benazepril at home.  Home benazepril, Bactrim on hold  Normocytic anemia: Baseline hemoglobin ranges from 9-12.  Presented with hemoglobin in the range of 8.  No report of hematochezia or melena.  DRE performed in the emergency department showed brown-colored stool.  He was also given unit of PRBC at [facility].  Currently hemoglobin in the range of 9.  Continue to monitor.  Iron level optimal.  Nausea, vomiting, diarrhea: Ongoing for last 5 weeks.  No fever or leukocytosis.  GI pathogen panel/C diff was sent. Elevated  lipase of 300s.  Patient denies any abdominal pain.  Abdomen is soft, nondistended, nontender, has good bowel sounds.  CT abdomen/pelvis done on 09/13/2022 at [facility] could not find a specific cause for patient's nausea and vomiting with no acute findings, unremarkable pancreas.  History of chronic UTI: UA showed plenty of bacteria, leukocytes.  On chronic Bactrim therapy at home.  Urine culture pending.  Currently on ceftriaxone  Hypothyroidism: Continue Synthyroid  BPH: Takes Flomax  Hypertension: Takes benazepril at home.  Currently on hold  Hyperlipidemia: On simvastatin as an outpatient.  Hypomagnesemia: Supplemented with magnesium.  Continue to monitor levels  Chronic diastolic CHF: Last echo as per October 2020 showed EF of 60 to 65%, impaired relaxation.  He was dehydrated on presentation and is being given IV fluid  Infrarenal abdominal aortic aneurysm: CT showed  3.6 cm in diameter, stable . Recommend follow-up ultrasound every 2 years   Confusion: Patient looks confused.  He is alert and awake and oriented to place only.As per stepdaughter he sleeps all the time,not that ambulatory.daughter said this all started after a back surgery about 9  months ago. He was walking fine before surgery.He missed his appointments.As per the stepdaughter,he is usually alert and oriented at baseline. Will check CT of the head.  Will also check ammonia level.  PT/OT consulted for generalized weakness.Vit B12 level more than level,TSH and folate level normal      Pressure Injury 09/14/22 Buttocks Right Stage 2 -  Partial thickness loss of dermis presenting as a shallow open injury with a red, pink wound bed without slough. pink (Active)  09/14/22 2131  Location: Buttocks  Location Orientation: Right  Staging: Stage 2 -  Partial thickness loss of dermis presenting as a shallow open injury with a red, pink wound bed without slough.  Wound Description (Comments): pink  Present on Admission:  Yes  Dressing Type Foam - Lift dressing to assess site every shift 09/14/22 2131    DVT prophylaxis:SCDs Start: 09/14/22 1934     Code Status: Full Code  Family Communication: Called and discussed with daughter on 6/2  Patient status:Inpatient  Patient is from :Home  Anticipated discharge VW:UJWJ  Estimated DC date:2-3 days   Consultants: None  Procedures:None  Antimicrobials:  Anti-infectives (From admission, onward)    None       Subjective: Patient seen and examined the bedside today.  Hemodynamically stable lying in bed.  Appears very weak and deconditioned.  Alert and awake, oriented to place only.  Examination was mostly unremarkable.  He does not have any abdominal pain.  Abdomen was soft and nondistended and nontender on examination.  He was on room air.  He says he lives with his wife and lives at [place].  He knows that he is in the hospital but cannot tell the correct day or month  Objective: Vitals:   09/14/22 2131 09/14/22 2352 09/15/22 0500 09/15/22 0528  BP: 117/67 119/66  135/79  Pulse: 95 96  99  Resp: 19 19  18   Temp: 97.6 F (36.4 C) 97.7 F (36.5 C)  97.8 F (36.6 C)  TempSrc: Oral Oral  Oral  SpO2: 100% 100%  100%  Weight: 100.5 kg  99.3 kg   Height: 6\' 7"  (2.007 m)       Intake/Output Summary (Last 24 hours) at 09/15/2022 0806 Last data filed at 09/15/2022 0500 Gross per 24 hour  Intake 3120 ml  Output 2200 ml  Net 920 ml   Filed Weights   09/14/22 1705 09/14/22 2131 09/15/22 0500  Weight: 110 kg 100.5 kg 99.3 kg    Examination:  General exam: Overall comfortable, not in distress, appears weak and deconditioned HEENT: PERRL Respiratory system:  no wheezes or crackles  Cardiovascular system: S1 & S2 heard, RRR.  Gastrointestinal system: Abdomen is nondistended, soft and nontender. Central nervous system: Alert and awake, oriented to place only Extremities: No edema, no clubbing ,no cyanosis Skin: No rashes, no ulcers,no  icterus   GU: Foley   Data Reviewed: I have personally reviewed following labs and imaging studies  CBC: Recent Labs  Lab 09/14/22 1740 09/14/22 1751 09/14/22 1752 09/15/22 0105 09/15/22 0129  WBC 4.7  --   --   --  5.0  NEUTROABS 3.9  --   --   --  4.1  HGB 8.8* 8.5* 8.2* 8.9* 9.0*  HCT 27.4* 25.0* 24.0* 28.2* 28.0*  MCV 97.2  --   --   --  94.0  PLT 92*  --   --   --  97*   Basic Metabolic Panel: Recent Labs  Lab 09/14/22 1740 09/14/22 1751 09/14/22 1752 09/15/22 0105 09/15/22 0129  NA 140 142 143 140 139  K 5.3* 5.4* 5.4* 5.3* 5.0  CL 112*  --  116* 114* 113*  CO2 13*  --   --  16* 16*  GLUCOSE 96  --  91 93 95  BUN 116*  --  >130* 98* 96*  CREATININE 6.45*  --  7.20* 5.19* 5.06*  CALCIUM 8.1*  --   --  8.1* 8.1*  MG 1.1*  --   --   --  1.5*  PHOS 4.8*  --   --   --  4.2     No results found for this or any previous visit (from the past 240 hour(s)).   Radiology Studies: US RENAL  Result Date: 09/15/2022 CLINICAL DATA:  [id number] with acute renal failure. EXAM: RENAL / URINARY TRACT ULTRASOUND COMPLETE COMPARISON:  CT without contrast 09/13/2022, CTA chest, abdomen and pelvis 08/01/2022 FINDINGS: Right Kidney: Renal measurements: 12.8 x 5.0 x 6.7 cm = volume: 230 mL. Echogenicity within normal limits. No mass or hydronephrosis visualized. Multiple anechoic thin-walled simple cysts are again noted, largest of these is 4.3 cm. No complex cysts are identified. Left Kidney: Renal measurements: 9.4 x 6.0 by 6.7 cm = volume: 192 mL. Echogenicity within normal limits. No mass or hydronephrosis visualized. Multiple anechoic thin walled simple cysts are again noted, largest of these is 4.1 cm. No complex cysts are identified. Bladder: Catheterized, contracted and obscured by bowel gas. Other: No free fluid. IMPRESSION: 1. No hydronephrosis or increased cortical echogenicity. 2. Multiple bilateral simple renal cysts. Electronically Signed   By: Almira Bar M.D.   On: 09/15/2022  06:06    Scheduled Meds:  levothyroxine  150 mcg Oral Q0600   simvastatin  20 mg Oral QHS   tamsulosin  0.4 mg Oral BID   Continuous Infusions:  lactated ringers 100 mL/hr at 09/14/22 2227     LOS: 1 day   Burnadette Pop, MD [facility] P6/05/2022, 8:06 AM

## 2022-09-15 NOTE — Progress Notes (Signed)
Patient received from ED, AO x4. Denies any pain. Nausea or vomiting. CHG bath completed, connected to tele and CCMD notified. Patient came with a 17 French indwelling Foley cathter that was placed in [facility] ED. DR. Arlean Hopping was by bedside, Per MD keep the Foley for now for Kidney function monitoring. Oriented pt to room and call bell system. Called  PT's wife [name] and made aware of pt's arrival to 4E10. Call bell within reach, plan of care continues.

## 2022-09-15 NOTE — Evaluation (Signed)
Clinical/Bedside Swallow Evaluation Patient Details  Name: [name] MRN: [id number] Date of Birth: [birth year]  Today's Date: 09/15/2022 Time: SLP Start Time (ACUTE ONLY): 1610 SLP Stop Time (ACUTE ONLY): 1625 SLP Time Calculation (min) (ACUTE ONLY): 15 min  Past Medical History:  Past Medical History:  Diagnosis Date   Arthritis    Bronchitis    hx of   COPD (chronic obstructive pulmonary disease) (HCC)    GERD (gastroesophageal reflux disease)    History of kidney stones    Hypertension    Hypothyroidism    Macular degeneration    right eye   Nausea vomiting and diarrhea 09/14/2022   Restless leg syndrome    Past Surgical History:  Past Surgical History:  Procedure Laterality Date   BACK SURGERY     Fusion 2005   Graves Disease     JOINT REPLACEMENT  1970   Right knee    LAMINECTOMY WITH POSTERIOR LATERAL ARTHRODESIS LEVEL 2 N/A 06/29/2021   Procedure: Laminectomy - Lumbar one-two, Lumbar two-three - redo with posterior lateral fusion with pedicle screws;  Surgeon: Julio Sicks, MD;  Location: [facility] OR;  Service: Neurosurgery;  Laterality: N/A;   NECK SURGERY     POSTERIOR CERVICAL FUSION/FORAMINOTOMY  12/05/2011   Procedure: POSTERIOR CERVICAL FUSION/FORAMINOTOMY LEVEL 4;  Surgeon: Reinaldo Meeker, MD;  Location: [facility] NEURO ORS;  Service: Neurosurgery;  Laterality: N/A;  Posterior Cervical fixation Cervical four-Seven,Cervical Decompresson Cervical three   THORACIC DISCECTOMY N/A 01/08/2021   Procedure: Thoracic One-Two, Thoracic Two-Three, Thoracic Eleven-Twelve, Thoracic Twelve-Lumbar One Laminectomy;  Surgeon: Julio Sicks, MD;  Location: [facility] OR;  Service: Neurosurgery;  Laterality: N/A;   HPI:  Patient is a 77 y.o. 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 He was transferred to [facility] from [facility] for evaluation of acute renal failure. As per report from [facility], he was having nausea, vomiting, diarrhea  for past 5 weeks as well as decreased oral intake. CT abdomen/pelvis done on 09/13/2022 at [facility] could not find a specific cause for patient's nausea and vomiting with no acute findings, unremarkable pancreas. CT head showed No acute intracranial abnormality or significant interval change, stable atrophy and white matter disease; likely reflects the sequela of chronic microvascular ischemia.    Assessment / Plan / Recommendation  Clinical Impression  Patient presents with questionable clinical s/s of oropharyngeal dysphagia as per this bedside swallow evaluation. He does not seem to have an appetite and when asked about eating for this evaluation, he told SLP he was going to wait because he was going "to have a big dinner." He appeared confused as he told SLP, "if anything comes up, I'll come back." Per RN, patient has not been eating and has been telling her he is going out to eat. With encouragement, he was willing to drink some thin liquids (water) and solid(graham cracker). No overt s/s aspiration, swallow initiation appeared timely, mastication and bolus transit with solid texture was delayed. Of note, patient is edentulous and he says he has dentures. (SLP could not find them in his room)  When still chewing graham cracker, he told SLP, "I dont like this" but declined to spit it out when given the option. Although patient initally denied being nauseated, having poor appetite or losing weight, he later told SLP that he was frequently nauseated after eating, has lost "50 or 60 pounds" (did not specify a time frame) and has a poor appetite. SLP recommendig  to continue with Dys 2 (minced) solids, thin liquids and will follow briefly to ensure toleration and determine if he could advance with solids. SLP Visit Diagnosis: Dysphagia, unspecified (R13.10)    Aspiration Risk  No limitations    Diet Recommendation Dysphagia 2 (Fine chop);Thin liquid   Liquid Administration via: Cup;Straw Medication  Administration: Whole meds with liquid Supervision: Patient able to self feed Compensations: Minimize environmental distractions;Slow rate Postural Changes: Seated upright at 90 degrees    Other  Recommendations Oral Care Recommendations: Oral care BID    Recommendations for follow up therapy are one component of a multi-disciplinary discharge planning process, led by the attending physician.  Recommendations may be updated based on patient status, additional functional criteria and insurance authorization.  Follow up Recommendations No SLP follow up      Assistance Recommended at Discharge    Functional Status Assessment Patient has had a recent decline in their functional status and demonstrates the ability to make significant improvements in function in a reasonable and predictable amount of time.  Frequency and Duration min 1 x/week  1 week       Prognosis Prognosis for improved oropharyngeal function: Good      Swallow Study   General Date of Onset: 09/14/22 HPI: Patient is a 77 y.o. 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 He was transferred to [facility] from [facility] for evaluation of acute renal failure. As per report from [facility], he was having nausea, vomiting, diarrhea for past 5 weeks as well as decreased oral intake. CT abdomen/pelvis done on 09/13/2022 at [facility] could not find a specific cause for patient's nausea and vomiting with no acute findings, unremarkable pancreas. CT head showed No acute intracranial abnormality or significant interval change, stable atrophy and white matter disease; likely reflects the sequela of chronic microvascular ischemia. Type of Study: Bedside Swallow Evaluation Previous Swallow Assessment: none found Diet Prior to this Study: Dysphagia 2 (finely chopped);Thin liquids (Level 0) Temperature Spikes Noted: No Respiratory Status: Room air History of Recent Intubation:  No Behavior/Cognition: Alert;Cooperative;Pleasant mood;Confused Oral Cavity Assessment: Within Functional Limits Oral Care Completed by SLP: No Oral Cavity - Dentition: Edentulous;Dentures, not available Vision: Functional for self-feeding Self-Feeding Abilities: Able to feed self Patient Positioning: Upright in bed Baseline Vocal Quality: Low vocal intensity;Normal Volitional Cough: Strong Volitional Swallow: Able to elicit    Oral/Motor/Sensory Function Overall Oral Motor/Sensory Function: Within functional limits   Ice Chips     Thin Liquid Thin Liquid: Within functional limits Presentation: Self Fed;Cup    Nectar Thick     Honey Thick     Puree Puree: Not tested   Solid     Solid: Impaired Oral Phase Impairments: Impaired mastication Oral Phase Functional Implications: Impaired mastication;Prolonged oral transit     Angela Nevin, MA, CCC-SLP Speech Therapy

## 2022-09-16 ENCOUNTER — Inpatient Hospital Stay (HOSPITAL_COMMUNITY): Payer: 59

## 2022-09-16 DIAGNOSIS — D649 Anemia, unspecified: Secondary | ICD-10-CM | POA: Diagnosis not present

## 2022-09-16 DIAGNOSIS — N179 Acute kidney failure, unspecified: Secondary | ICD-10-CM | POA: Diagnosis not present

## 2022-09-16 DIAGNOSIS — R112 Nausea with vomiting, unspecified: Secondary | ICD-10-CM | POA: Diagnosis not present

## 2022-09-16 DIAGNOSIS — I5032 Chronic diastolic (congestive) heart failure: Secondary | ICD-10-CM | POA: Diagnosis not present

## 2022-09-16 LAB — GLUCOSE, CAPILLARY
Glucose-Capillary: 69 mg/dL — ABNORMAL LOW (ref 70–99)
Glucose-Capillary: 124 mg/dL — ABNORMAL HIGH (ref 70–99)

## 2022-09-16 LAB — CULTURE, BLOOD (ROUTINE X 2)
Culture: NO GROWTH
Culture: NO GROWTH

## 2022-09-16 LAB — CBC
HCT: 27 % — ABNORMAL LOW (ref 39.0–52.0)
Hemoglobin: 9 g/dL — ABNORMAL LOW (ref 13.0–17.0)
MCH: 31.1 pg (ref 26.0–34.0)
MCHC: 33.3 g/dL (ref 30.0–36.0)
MCV: 93.4 fL (ref 80.0–100.0)
Platelets: 113 10*3/uL — ABNORMAL LOW (ref 150–400)
RBC: 2.89 MIL/uL — ABNORMAL LOW (ref 4.22–5.81)
RDW: 14.2 % (ref 11.5–15.5)
WBC: 5.5 10*3/uL (ref 4.0–10.5)
nRBC: 0 % (ref 0.0–0.2)

## 2022-09-16 LAB — BASIC METABOLIC PANEL
Anion gap: 12 (ref 5–15)
BUN: 60 mg/dL — ABNORMAL HIGH (ref 8–23)
CO2: 19 mmol/L — ABNORMAL LOW (ref 22–32)
Calcium: 7.7 mg/dL — ABNORMAL LOW (ref 8.9–10.3)
Chloride: 109 mmol/L (ref 98–111)
Creatinine, Ser: 2.44 mg/dL — ABNORMAL HIGH (ref 0.61–1.24)
GFR, Estimated: 27 mL/min — ABNORMAL LOW (ref >60)
Glucose, Bld: 80 mg/dL (ref 70–99)
Potassium: 4.2 mmol/L (ref 3.5–5.1)
Sodium: 140 mmol/L (ref 135–145)

## 2022-09-16 LAB — OCCULT BLOOD, POC DEVICE: Fecal Occult Bld: POSITIVE — AB

## 2022-09-16 LAB — LIPASE, BLOOD: Lipase: 615 U/L — ABNORMAL HIGH (ref 11–51)

## 2022-09-16 LAB — MAGNESIUM: Magnesium: 1.3 mg/dL — ABNORMAL LOW (ref 1.7–2.4)

## 2022-09-16 MED ORDER — MAGNESIUM SULFATE 4 GM/100ML IV SOLN
4.0000 g | Freq: Once | INTRAVENOUS | Status: AC
  Administered 2022-09-16: 4 g via INTRAVENOUS
  Filled 2022-09-16: qty 100

## 2022-09-16 MED ORDER — PANTOPRAZOLE SODIUM 40 MG IV SOLR
40.0000 mg | INTRAVENOUS | Status: DC
  Administered 2022-09-16 – 2022-09-18 (×3): 40 mg via INTRAVENOUS
  Filled 2022-09-16 (×3): qty 10

## 2022-09-16 MED ORDER — PROCHLORPERAZINE EDISYLATE 10 MG/2ML IJ SOLN
10.0000 mg | Freq: Four times a day (QID) | INTRAMUSCULAR | Status: DC | PRN
  Administered 2022-09-16 – 2022-09-17 (×2): 10 mg via INTRAVENOUS
  Filled 2022-09-16 (×2): qty 2

## 2022-09-16 MED ORDER — SODIUM CHLORIDE 0.9 % IV SOLN
2.0000 g | Freq: Two times a day (BID) | INTRAVENOUS | Status: DC
  Administered 2022-09-16 – 2022-09-17 (×2): 2 g via INTRAVENOUS
  Filled 2022-09-16 (×2): qty 12.5

## 2022-09-16 NOTE — Consult Note (Addendum)
Attending physician's note   I have taken a history, reviewed the chart, and examined the patient. I performed a substantive portion of this encounter, including complete performance of at least one of the key components, in conjunction with the APP. I agree with the APP's note, impression, and recommendations with my edits.   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 intake, and reported diarrhea (although he does not really report diarrhea to [place]).  Renal function improving from creatinine 11 on admission to 2.44 today.  Admission evaluation also notable for the following: - Lipase 375--> 615 - H/H 9/27 (baseline Hgb ~12) - PLT 113 (baseline ~175) - Ferritin 243, iron 112, TIBC 150, sat 75%, folate 9, B12 2290 - INR 1.1 - Ammonia negative - FOBT positive  1) Acute renal failure - Renal function improving - Management per primary Hospital service  2) Normocytic anemia No overt bleeding, but heme positive stool. Transfused 1 unit PRBCs at [facility] prior to transfer.  H/H has been otherwise largely stable since then - Eventually plan for EGD with possible colonoscopy later this week.  Still needs time for improvement from a renal standpoint - Continue serial CBC checks - Repeat iron panel in a couple weeks  3) Elevated lipase Possibly chemical pancreatitis, poor renal clearance, or may be related to PUD.  Noncon CT at [facility] was otherwise unremarkable from a pancreatic standpoint - Planning repeat CT today - Continue IV fluid and close monitoring as currently doing  GI service will continue to follow.  Dr. Lavon Paganini will assume his inpatient GI care starting tomorrow morning   Doristine Locks, DO, [place] [phone] office         Consultation  Referring Provider: [facility]  Physician:  Gilmore Laroche, FNP Primary Gastroenterologist: None, unassigned  Reason for Consultation:, Vomiting, diarrhea x 4 to 5 weeks, anemia heme positive stool, elevated lipase  HPI: [name] is a 77 y.o. male, who presented to the ER at [facility] on 09/13/2022 with complaints of nausea vomiting and diarrhea with poor oral intake over the past 3 to 4 weeks.  He was found to have an acute kidney injury and concerns for underlying GI blood loss. Labs show potassium 6.6/UN 158/creatinine 11.4 WBC 6.4/hemoglobin 8/hematocrit 25.2/MCV 96.2/platelets 101 He was transfused at [facility] and transferred here on 09/15/2022.  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  On admission here-WBC 4.7/globin 8.8/hematocrit 27.4 K+ 5.3/BUN 116/creatinine 6.45 INR 1.3 Documented heme positive here Lipase 375 Path panel and C. difficile quick screen both pending it sounds as if diarrhea has resolved  Today WBC 5.5/hemoglobin 9/hematocrit 27.0 Lipase 615 BUN 60/creatinine 2.44 Ferritin 243/iron 112/TIBC 150/iron sat 75  Patient is CT of the abdomen and pelvis noncontrasted scheduled for today. 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  Patient is a poor historian today denying any abdominal pain no current nausea and vomiting.  He says he really does not know about diarrhea and that we would need to ask his wife. Knows that he has not been eating well recently and says he feels "terrible" Denies any chronic  NSAID use, no prior GI evaluation   Past Medical History:  Diagnosis Date   Arthritis    Bronchitis    hx of   COPD (chronic obstructive pulmonary disease) (HCC)    GERD (gastroesophageal reflux disease)     History of kidney stones    Hypertension    Hypothyroidism    Macular degeneration    right eye   Nausea vomiting and diarrhea 09/14/2022   Restless leg syndrome     Past Surgical History:  Procedure Laterality Date   BACK SURGERY     Fusion 2005   Graves Disease     JOINT REPLACEMENT  1970   Right knee    LAMINECTOMY WITH POSTERIOR LATERAL ARTHRODESIS LEVEL 2 N/A 06/29/2021   Procedure: Laminectomy - Lumbar one-two, Lumbar two-three - redo with posterior lateral fusion with pedicle screws;  Surgeon: Julio Sicks, MD;  Location: [facility] OR;  Service: Neurosurgery;  Laterality: N/A;   NECK SURGERY     POSTERIOR CERVICAL FUSION/FORAMINOTOMY  12/05/2011   Procedure: POSTERIOR CERVICAL FUSION/FORAMINOTOMY LEVEL 4;  Surgeon: Reinaldo Meeker, MD;  Location: [facility] NEURO ORS;  Service: Neurosurgery;  Laterality: N/A;  Posterior Cervical fixation Cervical four-Seven,Cervical Decompresson Cervical three   THORACIC DISCECTOMY N/A 01/08/2021   Procedure: Thoracic One-Two, Thoracic Two-Three, Thoracic Eleven-Twelve, Thoracic Twelve-Lumbar One Laminectomy;  Surgeon: Julio Sicks, MD;  Location: [facility] OR;  Service: Neurosurgery;  Laterality: N/A;    Prior to Admission medications   Medication Sig Start Date End Date Taking? Authorizing Provider  aspirin 81 MG chewable tablet Chew 1 tablet (81 mg total) by mouth daily. 09/04/21  Yes Gilmore Laroche, FNP  benazepril (LOTENSIN) 20 MG tablet TAKE 1 TABLET BY MOUTH EVERY DAY 08/05/22  Yes Gilmore Laroche, FNP  DULoxetine (CYMBALTA) 60 MG capsule TAKE 1 CAPSULE BY MOUTH DAILY 08/28/22  Yes Gilmore Laroche, FNP  gabapentin (NEURONTIN) 300 MG capsule TAKE ONE CAPSULE BY MOUTH THREE TIMES DAILY 08/28/22  Yes Gilmore Laroche, FNP  levothyroxine (SYNTHROID) 150 MCG tablet TAKE 1 TABLET BY MOUTH DAILY BEFORE BREAKFAST 02/04/22  Yes Gilmore Laroche, FNP  Multiple Vitamins-Minerals (PRESERVISION AREDS 2 PO) Take 1 capsule by mouth daily.   Yes [provider]  simvastatin  (ZOCOR) 20 MG tablet TAKE 1 TABLET BY MOUTH AT BEDTIME 07/02/22  Yes Gilmore Laroche, FNP  tamsulosin (FLOMAX) 0.4 MG CAPS capsule Take 1 capsule (0.4 mg total) by mouth in the morning and at bedtime. 09/20/21  Yes Gilmore Laroche, FNP  triamcinolone cream (KENALOG) 0.1 % Apply topically 2 (two) times daily. to affected area(s) 04/04/22  Yes Gilmore Laroche, FNP  trimethoprim (TRIMPEX) 100 MG tablet TAKE 1 TABLET BY MOUTH AT BEDTIME 08/29/22  Yes [name], [name], MD  vitamin B-12 (CYANOCOBALAMIN) 1000 MCG tablet Take 1,000 mcg by mouth daily.   Yes [provider]  Cholecalciferol (VITAMIN D3) 1.25 MG (50000 UT) TABS Take 50,000 Units by mouth every Wednesday.    [provider]  loperamide (IMODIUM) 2 MG capsule TAKE ONE CAPSULE BY MOUTH WITH each loose stool AS NEEDED (max of 5 doses in 24 hours) Patient taking differently: Take 2 mg by mouth as needed for diarrhea or loose stools. 08/08/22   Gilmore Laroche, FNP  prochlorperazine (COMPAZINE) 5 MG tablet Take 1 tablet (5 mg total) by mouth every 6 (six) hours as needed for nausea or vomiting. 08/01/22   Arthor Captain, PA-C    Current Facility-Administered Medications  Medication Dose Route Frequency Provider Last Rate Last Admin   acetaminophen (  TYLENOL) tablet 650 mg  650 mg Oral Q6H PRN Howerter, Justin B, DO       Or   acetaminophen (TYLENOL) suppository 650 mg  650 mg Rectal Q6H PRN Howerter, Justin B, DO       cefTRIAXone (ROCEPHIN) 1 g in sodium chloride 0.9 % 100 mL IVPB  1 g Intravenous Daily Burnadette Pop, MD 200 mL/hr at 09/16/22 0821 1 g at 09/16/22 1610   Chlorhexidine Gluconate Cloth 2 % PADS 6 each  6 each Topical Daily Burnadette Pop, MD   6 each at 09/16/22 0830   levothyroxine (SYNTHROID) tablet 150 mcg  150 mcg Oral Q0600 Howerter, Justin B, DO   150 mcg at 09/16/22 9604   magnesium sulfate IVPB 4 g 100 mL  4 g Intravenous Once Burnadette Pop, MD 50 mL/hr at 09/16/22 1233 4 g at 09/16/22 1233   melatonin  tablet 3 mg  3 mg Oral QHS PRN Howerter, Justin B, DO       ondansetron (ZOFRAN) injection 4 mg  4 mg Intravenous Q6H PRN Howerter, Justin B, DO   4 mg at 09/16/22 0900   prochlorperazine (COMPAZINE) injection 10 mg  10 mg Intravenous Q6H PRN Burnadette Pop, MD       simvastatin (ZOCOR) tablet 20 mg  20 mg Oral QHS Howerter, Justin B, DO   20 mg at 09/15/22 2122   sodium bicarbonate 150 mEq in sterile water 1,150 mL infusion   Intravenous Continuous Burnadette Pop, MD 100 mL/hr at 09/16/22 1152 New Bag at 09/16/22 1152   tamsulosin (FLOMAX) capsule 0.4 mg  0.4 mg Oral BID Howerter, Justin B, DO   0.4 mg at 09/15/22 2123    Allergies as of 09/14/2022 - Review Complete 09/14/2022  Allergen Reaction Noted   Sulfa antibiotics Other (See Comments) 11/01/2011    Family History  Problem Relation Age of Onset   Heart attack Mother 60   Heart disease Father     Social History   Socioeconomic History   Marital status: Married    Spouse name: Keane Scrape. Dunton   Number of children: 1   Years of education: 12   Highest education level: Not on file  Occupational History   Not on file  Tobacco Use   Smoking status: Some Days    Packs/day: 0.25    Years: 50.00    Additional pack years: 0.00    Total pack years: 12.50    Types: Cigarettes   Smokeless tobacco: Never  Vaping Use   Vaping Use: Never used  Substance and Sexual Activity   Alcohol use: No   Drug use: Yes   Sexual activity: Not on file  Other Topics Concern   Not on file  Social History Narrative   Right handed   Lives with wife   No caffeine use   Social Determinants of Health   Financial Resource Strain: Low Risk  (09/18/2021)   Overall Financial Resource Strain (CARDIA)    Difficulty of Paying Living Expenses: Not hard at all  Food Insecurity: No Food Insecurity (09/18/2021)   Hunger Vital Sign    Worried About Running Out of Food in the Last Year: Never true    Ran Out of Food in the Last Year: Never true   Transportation Needs: No Transportation Needs (09/18/2021)   PRAPARE - Administrator, Civil Service (Medical): No    Lack of Transportation (Non-Medical): No  Physical Activity: Insufficiently Active (09/18/2021)   Exercise Vital Sign  Days of Exercise per Week: 5 days    Minutes of Exercise per Session: 20 min  Stress: No Stress Concern Present (09/18/2021)   [employer] of Occupational Health - Occupational Stress Questionnaire    Feeling of Stress : Not at all  Social Connections: Moderately Integrated (09/18/2021)   Social Connection and Isolation Panel [NHANES]    Frequency of Communication with Friends and Family: More than three times a week    Frequency of Social Gatherings with Friends and Family: More than three times a week    Attends Religious Services: More than 4 times per year    Active Member of [employer] or Organizations: No    Attends Banker Meetings: Never    Marital Status: Married  Catering manager Violence: Not At Risk (09/18/2021)   Humiliation, Afraid, Rape, and Kick questionnaire    Fear of Current or Ex-Partner: No    Emotionally Abused: No    Physically Abused: No    Sexually Abused: No    Review of Systems: Pertinent positive and negative review of systems were noted in the above HPI section.  All other review of systems was otherwise negative.  Physical Exam: Vital signs in last 24 hours: Temp:  [97.8 F (36.6 C)-98.5 F (36.9 C)] 98.2 F (36.8 C) (06/03 0800) Pulse Rate:  [74-107] 97 (06/03 0800) Resp:  [13-17] 13 (06/03 0800) BP: (125-145)/(69-84) 145/84 (06/03 0800) SpO2:  [96 %-100 %] 96 % (06/03 0800) Weight:  [98.5 kg] 98.5 kg (06/03 0310) Last BM Date : 09/13/22 General:   Alert,  Well-developed, chronically ill-appearing elderly white male Head:  Normocephalic and atraumatic. Eyes:  Sclera clear, no icterus.   Conjunctiva pale Ears:  Normal auditory acuity. Nose:  No deformity, discharge,  or lesions. Mouth:  No  deformity or lesions.   Neck:  Supple; no masses or thyromegaly. Lungs:  Clear throughout to auscultation.   No wheezes, crackles, or rhonchi.  Heart:  Regular rate and rhythm; no murmurs, clicks, rubs,  or gallops. Abdomen:  Soft,nontender, BS active,nonpalp mass or hsm.   Rectal: Not done, documented brown stool heme positive Msk:  Symmetrical without gross deformities. . Pulses:  Normal pulses noted. Extremities:  Without clubbing or edema. Neurologic:  Alert and  oriented x4;  grossly normal neurologically. Skin:  Intact without significant lesions or rashes.. Psych:  Alert and cooperative. Normal mood and affect.  Intake/Output from previous day: 06/02 0701 - 06/03 0700 In: 1263.8 [P.O.:240; I.V.:923.8; IV Piggyback:100] Out: 2900 [Urine:2900] Intake/Output this shift: Total I/O In: -  Out: 900 [Urine:900]  Lab Results: Recent Labs    09/14/22 1740 09/14/22 1751 09/15/22 0105 09/15/22 0129 09/16/22 0138  WBC 4.7  --   --  5.0 5.5  HGB 8.8*   < > 8.9* 9.0* 9.0*  HCT 27.4*   < > 28.2* 28.0* 27.0*  PLT 92*  --   --  97* 113*   < > = values in this interval not displayed.   BMET Recent Labs    09/15/22 0105 09/15/22 0129 09/16/22 0138  NA 140 139 140  K 5.3* 5.0 4.2  CL 114* 113* 109  CO2 16* 16* 19*  GLUCOSE 93 95 80  BUN 98* 96* 60*  CREATININE 5.19* 5.06* 2.44*  CALCIUM 8.1* 8.1* 7.7*   LFT Recent Labs    09/15/22 0129  PROT 5.5*  ALBUMIN 2.8*  AST 11*  ALT 7  ALKPHOS 67  BILITOT 0.9   PT/INR Recent Labs  09/14/22 1740 09/15/22 0129  LABPROT 16.5* 14.7  INR 1.3* 1.1   Hepatitis Panel No results for input(s): "HEPBSAG", "HCVAB", "HEPAIGM", "HEPBIGM" in the last 72 hours.  IMPRESSION:   #81 77 year old white male admitted in transfer from [facility] after he presented there with severe acute kidney injury after 3 to 4 weeks of home with poor oral intake, nausea and intermittent vomiting and some report of diarrhea. Etiology of acute  kidney injury felt to be prerenal.  Etiology of nausea and vomiting is not clear though wonder if some of this may have been secondary to azotemia  Diarrhea appears to have resolved, infectious prep is pending  #2 anemia, normocytic, not iron deficient, heme positive He did receive 1 unit of packed RBCs prior to arrival here Hgb has been stable since Rule out gastropathy, peptic ulcer disease, erosive esophagitis secondary to persistent nausea and vomiting.  #3 elevated lipase-no evidence of pancreatitis or pancreatic lesion on contrasted imaging.  Rule out secondary to acute kidney injury?  #4 chronic diastolic congestive heart failure #5 BPH #6.  Hypothyroidism #7.  History of hypertension-hypotensive on admission. 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 difficile and GI path panel if further diarrhea, if not then discontinue antral precautions Will plan for eventual EGD later this week, and can discuss colonoscopy.  Would like him to have further improvement in acute kidney injury prior to prep and procedures.  Amy Esterwood PA-C 09/16/2022, 2:22 PM

## 2022-09-16 NOTE — Evaluation (Signed)
Occupational Therapy Evaluation Patient Details Name: [name] MRN: [id number] DOB: 1946/03/24 Today's Date: 09/16/2022   History of Present Illness Pt admitted 6/1 from [facility] with GI bleed (for a month and a half before admission) and acute renal failure due to dehydration from 5 weeks of nausea/vomiting. PMH includes: lumbar spine surgery in 2005, 2022, and 2023, HTN, HLD, and CHF.   Clinical Impression   Pt reports at baseline he is dependent upon spouse for ADLs at bed level, has not been OOB since October 2023 (?) surgery. Pt needing min-total A for ADLs, and mod A rolling R/L for pad placement, pt declines EOB/OOB mobility at this time due to nausea despite premedication. Repositioned pt with pillow on L side for pressure relief at end of session, pt presenting with impairments listed below, will follow acutely. Patient will benefit from continued inpatient follow up therapy, <3 hours/day to maximize safety and independence with ADLs/functional mobility.      Recommendations for follow up therapy are one component of a multi-disciplinary discharge planning process, led by the attending physician.  Recommendations may be updated based on patient status, additional functional criteria and insurance authorization.   Assistance Recommended at Discharge Frequent or constant Supervision/Assistance  Patient can return home with the following Two people to help with walking and/or transfers;A lot of help with bathing/dressing/bathroom;Assistance with feeding;Direct supervision/assist for medications management;Direct supervision/assist for financial management;Assist for transportation;Help with stairs or ramp for entrance    Functional Status Assessment  Patient has had a recent decline in their functional status and demonstrates the ability to make significant improvements in function in a reasonable and predictable amount of time.  Equipment Recommendations  Other (comment)  (defer)    Recommendations for Other Services PT consult     Precautions / Restrictions Precautions Precautions: Fall Restrictions Weight Bearing Restrictions: No      Mobility Bed Mobility Overal bed mobility: Needs Assistance Bed Mobility: Rolling Rolling: Mod assist         General bed mobility comments: modA to complete, pt assisting with use of bed rail    Transfers                   General transfer comment: pt declined due to nausea      Balance Overall balance assessment:  (unable to assess)                                         ADL either performed or assessed with clinical judgement   ADL Overall ADL's : Needs assistance/impaired Eating/Feeding: NPO   Grooming: Minimal assistance;Bed level   Upper Body Bathing: Maximal assistance;Bed level   Lower Body Bathing: Maximal assistance;Bed level   Upper Body Dressing : Maximal assistance;Bed level   Lower Body Dressing: Maximal assistance;Bed level   Toilet Transfer: Maximal assistance;+2 for physical assistance   Toileting- Clothing Manipulation and Hygiene: Total assistance Toileting - Clothing Manipulation Details (indicate cue type and reason): catheter     Functional mobility during ADLs: Maximal assistance;+2 for physical assistance       Vision   Additional Comments: will further assess     Perception Perception Perception Tested?: No   Praxis Praxis Praxis tested?: Not tested    Pertinent Vitals/Pain Pain Assessment Pain Assessment: Faces Pain Score: 2  Faces Pain Scale: Hurts a little bit Pain Location: nausea more than pain Pain Descriptors /  Indicators: Discomfort Pain Intervention(s): Limited activity within patient's tolerance, Monitored during session, Repositioned     Hand Dominance Right   Extremity/Trunk Assessment Upper Extremity Assessment Upper Extremity Assessment: Generalized weakness   Lower Extremity Assessment Lower Extremity  Assessment: Defer to PT evaluation   Cervical / Trunk Assessment Cervical / Trunk Assessment: Kyphotic   Communication Communication Communication: No difficulties   Cognition Arousal/Alertness: Awake/alert Behavior During Therapy: Flat affect Overall Cognitive Status: No family/caregiver present to determine baseline cognitive functioning                                 General Comments: oriented to self, does not state date but provides home information     General Comments  VSS on RA    Exercises     Shoulder Instructions      Home Living Family/patient expects to be discharged to:: Private residence Living Arrangements: Spouse/significant other;Children;Other relatives Available Help at Discharge: Family;Other (Comment) Type of Home: House Home Access: Ramped entrance     Home Layout: Multi-level Alternate Level Stairs-Number of Steps: 2 steps to kitchen and 1 to living room   Bathroom Shower/Tub: Sponge bathes at baseline         Home Equipment: Rollator (4 wheels)   Additional Comments: pt unreliable historian and did not answer all questions. no family present to confirm      Prior Functioning/Environment Prior Level of Function : Needs assist;Patient poor historian/Family not available             Mobility Comments: mostly bedbound at home, reports he hasnt been up since after surgery in october (?),pt calls fire dept to get out of house for appts ADLs Comments: pt dependent on spouse for ADLs        OT Problem List: Decreased strength;Decreased range of motion;Decreased activity tolerance;Impaired balance (sitting and/or standing);Decreased cognition;Decreased safety awareness;Decreased coordination      OT Treatment/Interventions: Self-care/ADL training;Therapeutic exercise;Energy conservation;DME and/or AE instruction;Therapeutic activities;Patient/family education;Balance training    OT Goals(Current goals can be found in the  care plan section) Acute Rehab OT Goals Patient Stated Goal: none stated OT Goal Formulation: With patient Time For Goal Achievement: 09/30/22 Potential to Achieve Goals: Good ADL Goals Pt Will Perform Grooming: with min guard assist;sitting;bed level Pt Will Perform Upper Body Bathing: with min guard assist;bed level;sitting Additional ADL Goal #1: pt will perform bed mobility min guard A in prep for ADLs  OT Frequency: Min 1X/week    Co-evaluation PT/OT/SLP Co-Evaluation/Treatment: Yes Reason for Co-Treatment: For patient/therapist safety;Necessary to address cognition/behavior during functional activity;To address functional/ADL transfers PT goals addressed during session: Mobility/safety with mobility;Balance;Strengthening/ROM OT goals addressed during session: ADL's and self-care;Strengthening/ROM      AM-PAC OT "6 Clicks" Daily Activity     Outcome Measure Help from another person eating meals?:  (NPO) Help from another person taking care of personal grooming?: A Lot Help from another person toileting, which includes using toliet, bedpan, or urinal?: Total Help from another person bathing (including washing, rinsing, drying)?: A Lot Help from another person to put on and taking off regular upper body clothing?: A Lot Help from another person to put on and taking off regular lower body clothing?: Total 6 Click Score: 8   End of Session Nurse Communication: Mobility status  Activity Tolerance: Other (comment) (limited by nausea) Patient left: in bed;with call bell/phone within reach;with bed alarm set  OT Visit Diagnosis: Unsteadiness on feet (  R26.81);Other abnormalities of gait and mobility (R26.89);Muscle weakness (generalized) (M62.81)                Time: 4098-1191 OT Time Calculation (min): 24 min Charges:  OT General Charges $OT Visit: 1 Visit OT Evaluation $OT Eval Moderate Complexity: 1 Mod  Lamichael Youkhana K, OTD, OTR/L SecureChat Preferred Acute Rehab [phone]  Virginia Curl K Koonce 09/16/2022, 1:12 PM

## 2022-09-16 NOTE — Evaluation (Signed)
Physical Therapy Evaluation Patient Details Name: [name] MRN: [id number] DOB: 1945-12-05 Today's Date: 09/16/2022  History of Present Illness  Pt admitted 6/1 from [facility] with GI bleed (for a month and a half before admission) and acute renal failure due to dehydration from 5 weeks of nausea/vomiting. PMH includes: lumbar spine surgery in 2005, 2022, and 2023, HTN, HLD, and CHF.   Clinical Impression  Pt in bed upon arrival of PT, agreeable to evaluation at this time. Prior to admission the pt was largely bedbound since back surgery, reports difficult transition from hospital to rehab to home with multiple falls. The pt reports assist from his spouse for ADLs in bed and minimal activity outside of bed recently. The pt was limited this session due to nausea (despite being premedicated) and therefore declined mobility progression more than rolling in bed due to concern for vomiting. The pt was able to demo movement in BLE, but declined movements for isolated muscle testing. Given current mobility limitations, would recommend continued skilled PT <3 hours/day to decrease caregiver burden at home. If pt returning home, would recommend WC, hoyer lift, hospital bed, and HH aide.     Recommendations for follow up therapy are one component of a multi-disciplinary discharge planning process, led by the attending physician.  Recommendations may be updated based on patient status, additional functional criteria and insurance authorization.  Follow Up Recommendations Can patient physically be transported by private vehicle: No     Assistance Recommended at Discharge Frequent or constant Supervision/Assistance  Patient can return home with the following  Two people to help with walking and/or transfers;Assistance with cooking/housework;Two people to help with bathing/dressing/bathroom;Assistance with feeding;Direct supervision/assist for medications management;Direct supervision/assist for  financial management;Assist for transportation;Help with stairs or ramp for entrance    Equipment Recommendations Wheelchair (measurements PT);Wheelchair cushion (measurements PT);Hospital bed (hoyer lift (if he were to return home))  Recommendations for Other Services       Functional Status Assessment Patient has had a recent decline in their functional status and demonstrates the ability to make significant improvements in function in a reasonable and predictable amount of time.     Precautions / Restrictions Precautions Precautions: Fall Restrictions Weight Bearing Restrictions: No      Mobility  Bed Mobility Overal bed mobility: Needs Assistance Bed Mobility: Rolling Rolling: Mod assist         General bed mobility comments: modA to complete, pt assisting with use of bed rail    Transfers                   General transfer comment: pt declined          Pertinent Vitals/Pain Pain Assessment Pain Assessment: Faces Faces Pain Scale: Hurts a little bit Pain Location: nausea more than pain Pain Descriptors / Indicators: Discomfort Pain Intervention(s): Limited activity within patient's tolerance, Monitored during session, Repositioned    Home Living Family/patient expects to be discharged to:: Private residence Living Arrangements: Spouse/significant other;Children;Other relatives (daughter-in-law and a few grandkids) Available Help at Discharge: Family;Other (Comment) (pt reports fire dept comes for any appointments to help him leave the house) Type of Home: House Home Access: Ramped entrance     Alternate Level Stairs-Number of Steps: 2 steps to kitchen and 1 to living room Home Layout: Multi-level Home Equipment: Rollator (4 wheels) Additional Comments: pt unreliable historian and did not answer all questions. no family present to confirm    Prior Function Prior Level of Function : Needs assist;Patient  poor historian/Family not available              Mobility Comments: mostly bedbound at home, reports he hasnt been up since after surgery in october (?) ADLs Comments: pt reports wife completes     Hand Dominance   Dominant Hand: Right    Extremity/Trunk Assessment   Upper Extremity Assessment Upper Extremity Assessment: Generalized weakness;Defer to OT evaluation    Lower Extremity Assessment Lower Extremity Assessment: Generalized weakness (pt able to move at toes but declined attempts to assess knee and hip strength when pt in bed and declined sitting EOB. Did use slightly to complete rolling in bed. inconsistently reports equal decreased sensation biterally below knee)    Cervical / Trunk Assessment Cervical / Trunk Assessment: Kyphotic  Communication   Communication: No difficulties (soft-spoken)  Cognition Arousal/Alertness: Awake/alert Behavior During Therapy: Flat affect Overall Cognitive Status: No family/caregiver present to determine baseline cognitive functioning                                 General Comments: no family present, pt answering questions intermittently and unable to validate answers. seems able to follow commands but did not want to move due to nausea in session. poor memory regarding delivery of nausea meds prior to PT arrival.        General Comments General comments (skin integrity, edema, etc.): VSS on RA, pt with a few instances of nausea, RN called for nasea meds (but they had been given prior to session)    Exercises     Assessment/Plan    PT Assessment Patient needs continued PT services  PT Problem List Decreased strength;Decreased activity tolerance;Decreased balance;Decreased mobility       PT Treatment Interventions Gait training;Functional mobility training;Therapeutic exercise;Therapeutic activities;Balance training;Patient/family education    PT Goals (Current goals can be found in the Care Plan section)  Acute Rehab PT Goals Patient Stated Goal:  reduce nausea PT Goal Formulation: With patient Time For Goal Achievement: 09/30/22 Potential to Achieve Goals: Fair    Frequency Min 1X/week     Co-evaluation PT/OT/SLP Co-Evaluation/Treatment: Yes Reason for Co-Treatment: For patient/therapist safety;Necessary to address cognition/behavior during functional activity;To address functional/ADL transfers PT goals addressed during session: Mobility/safety with mobility;Balance;Strengthening/ROM         AM-PAC PT "6 Clicks" Mobility  Outcome Measure Help needed turning from your back to your side while in a flat bed without using bedrails?: Total Help needed moving from lying on your back to sitting on the side of a flat bed without using bedrails?: Total Help needed moving to and from a bed to a chair (including a wheelchair)?: Total Help needed standing up from a chair using your arms (e.g., wheelchair or bedside chair)?: Total Help needed to walk in hospital room?: Total Help needed climbing 3-5 steps with a railing? : Total 6 Click Score: 6    End of Session   Activity Tolerance: Other (comment) (nausea) Patient left: in bed;with call bell/phone within reach;with bed alarm set Nurse Communication: Mobility status;Need for lift equipment PT Visit Diagnosis: Other abnormalities of gait and mobility (R26.89);Muscle weakness (generalized) (M62.81)    Time: 1610-9604 PT Time Calculation (min) (ACUTE ONLY): 24 min   Charges:   PT Evaluation $PT Eval Low Complexity: 1 Low          Vickki Muff, PT, DPT   Acute Rehabilitation Department Office [phone] Secure Chat Communication Preferred  Ronnie Derby  09/16/2022, 12:24 PM

## 2022-09-16 NOTE — Progress Notes (Signed)
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  Decreased oral intake.  On presentation, he was hypotensive, lab work showed creatinine of 11.4, hemoglobin of 8.  Patient was also given a unit of PRBC at [facility].  Patient admitted for further management.  Assessment & Plan:  Principal Problem:   ARF (acute renal failure) (HCC) Active Problems:   Essential hypertension   BPH (benign prostatic hyperplasia)   Acquired hypothyroidism   Hypomagnesemia   Hyperkalemia   Acute on chronic anemia   Nausea vomiting and diarrhea   Hyperlipidemia   Chronic diastolic CHF (congestive heart failure) (HCC)  AKI/hyperkalemia: Previous creatinine was 1.1 as per 07/31/2022.  Presented with creatinine in the range of 11.  This is most likely prerenal AKI because of dehydration from vomiting and diarrhea.  Ultrasound of the kidneys did not show any hydronephrosis but showed bilateral simple renal cyst.  Kidney function expected to recover with IV fluids.  Continue IV fluids with bicarb. Patient also takes benazepril at home.  Home benazepril, Bactrim on hold  Normocytic anemia: Baseline hemoglobin ranges from 9-12.  Presented with hemoglobin in the range of 8.  No report of hematochezia or melena.  DRE performed in the emergency department showed brown-colored stool.But FOBT came out to be positive.  He was also given unit of PRBC at [facility]  Currently hemoglobin in the range of 9.   Iron level optimal. Due to history of diarrhea, vomiting , elevated lipase, positive FOBT, we consulted GI  Nausea, vomiting,  diarrhea/elevated lipase: Ongoing for last 5 weeks.  No fever or leukocytosis.  GI pathogen panel/C diff was sent. Elevated lipase of 600s.  Patient denies any abdominal pain but is nauseous and vomiting today. abdomen is soft, nondistended, nontender, has good bowel sounds.  CT abdomen/pelvis done on 09/13/2022 at [facility] could not find a specific cause for patient's nausea and vomiting with no acute findings, unremarkable pancreas. Will repeat CT abdomen/pelvis today without contrast.GI consulted  History of chronic UTI: UA showed plenty of bacteria, leukocytes.  On chronic Bactrim therapy at home.  Urine culture pending.  Currently on ceftriaxone  Hypothyroidism: Continue Synthyroid  BPH: Takes Flomax  Hypertension: Takes benazepril at home.  Currently on hold  Hyperlipidemia: On simvastatin as an outpatient.  Hypomagnesemia: Supplemented with magnesium.  Continue to monitor levels  Chronic diastolic CHF: Last echo as per October 2020 showed EF of 60 to 65%, impaired relaxation.  He was dehydrated on presentation and is being given IV fluid  Infrarenal abdominal aortic aneurysm: CT showed  3.6 cm in diameter, stable . Recommend follow-up ultrasound every 2 years   Confusion: Patient looks confused.  He is alert and awake and oriented to place only.As per stepdaughter he sleeps all the time,not that ambulatory.daughter said this all started after a back surgery about 9  months ago. He was walking fine before surgery.He missed his appointments.As per the stepdaughter,he is usually alert and oriented at baseline. CT of the head without any acute findings.  Normal ammonia level.  PT/OT consulted for generalized weakness.Vit B12 level more than level,TSH and folate level normal.  If his mentation remains like this,  we might do MRI of his brain.      Pressure Injury 09/14/22 Buttocks Right Stage 2 -  Partial thickness loss of dermis presenting as a shallow open injury with a red, pink  wound bed without slough. pink (Active)  09/14/22 2131  Location: Buttocks  Location Orientation: Right  Staging: Stage 2 -  Partial thickness loss of dermis presenting as a shallow open injury with a red, pink wound bed without slough.  Wound Description (Comments): pink  Present on Admission: Yes  Dressing Type Foam - Lift dressing to assess site every shift 09/15/22 2020    DVT prophylaxis:SCDs Start: 09/14/22 1934     Code Status: Full Code  Family Communication: Called and discussed with daughter on 6/2.:  Discussed with wife on phone on 6/3  Patient status:Inpatient  Patient is from :Home  Anticipated discharge ZO:XWRU versus SNF  Estimated DC date:2-3 days   Consultants: GI  Procedures:None  Antimicrobials:  Anti-infectives (From admission, onward)    Start     Dose/Rate Route Frequency Ordered Stop   09/15/22 0900  cefTRIAXone (ROCEPHIN) 1 g in sodium chloride 0.9 % 100 mL IVPB        1 g 200 mL/hr over 30 Minutes Intravenous Daily 09/15/22 0821         Subjective: Patient seen and examined at bedside today.  Hemodynamically stable.  He was nauseous this morning and was about to vomit.  He denies abdomen pain.  Mentation wise, he looks slightly better than yesterday.  More alert and participates in conversation.  Obeys commands.  Not in any Distress  Objective: Vitals:   09/15/22 2020 09/15/22 2323 09/16/22 0310 09/16/22 0800  BP: 125/79 135/78 138/79 (!) 145/84  Pulse: 99 74 (!) 107 97  Resp: 17 14 15 13   Temp: 98.5 F (36.9 C) 97.8 F (36.6 C) 98.2 F (36.8 C) 98.2 F (36.8 C)  TempSrc: Oral Oral Oral Oral  SpO2: 96% 100% 100% 96%  Weight:   98.5 kg   Height:        Intake/Output Summary (Last 24 hours) at 09/16/2022 1119 Last data filed at 09/16/2022 0900 Gross per 24 hour  Intake 1263.82 ml  Output 3800 ml  Net -2536.18 ml   Filed Weights   09/14/22 2131 09/15/22 0500 09/16/22 0310  Weight: 100.5 kg 99.3 kg 98.5 kg     Examination:  General exam: Overall comfortable, not in distress, weak, deconditioned HEENT: PERRL Respiratory system:  no wheezes or crackles  Cardiovascular system: S1 & S2 heard, RRR.  Gastrointestinal system: Abdomen is nondistended, soft and nontender. Central nervous system: Alert and awake, oriented to place only Extremities: No edema, no clubbing ,no cyanosis Skin: No rashes, no ulcers,no icterus   GU: Foley   Data Reviewed: I have personally reviewed following labs and imaging studies  CBC: Recent Labs  Lab 09/14/22 1740 09/14/22 1751 09/14/22 1752 09/15/22 0105 09/15/22 0129 09/16/22 0138  WBC 4.7  --   --   --  5.0 5.5  NEUTROABS 3.9  --   --   --  4.1  --   HGB 8.8* 8.5* 8.2* 8.9* 9.0* 9.0*  HCT 27.4* 25.0* 24.0* 28.2* 28.0* 27.0*  MCV 97.2  --   --   --  94.0 93.4  PLT 92*  --   --   --  97* 113*   Basic Metabolic Panel: Recent Labs  Lab 09/14/22 1740 09/14/22 1751 09/14/22 1752 09/15/22 0105 09/15/22 0129 09/16/22 0138  NA  140 142 143 140 139 140  K 5.3* 5.4* 5.4* 5.3* 5.0 4.2  CL 112*  --  116* 114* 113* 109  CO2 13*  --   --  16* 16* 19*  GLUCOSE 96  --  91 93 95 80  BUN 116*  --  >130* 98* 96* 60*  CREATININE 6.45*  --  7.20* 5.19* 5.06* 2.44*  CALCIUM 8.1*  --   --  8.1* 8.1* 7.7*  MG 1.1*  --   --   --  1.5* 1.3*  PHOS 4.8*  --   --   --  4.2  --      Recent Results (from the past 240 hour(s))  Blood culture (routine x 2)     Status: None (Preliminary result)   Collection Time: 09/14/22  5:40 PM   Specimen: BLOOD  Result Value Ref Range Status   Specimen Description BLOOD LEFT ARM  Final   Special Requests   Final    BOTTLES DRAWN AEROBIC AND ANAEROBIC Blood Culture results may not be optimal due to an excessive volume of blood received in culture bottles   Culture   Final    NO GROWTH 2 DAYS Performed at [facility], [address], [place], [place] [address]    Report Status PENDING  Incomplete  Blood culture (routine  x 2)     Status: None (Preliminary result)   Collection Time: 09/14/22  7:34 PM   Specimen: BLOOD  Result Value Ref Range Status   Specimen Description BLOOD LEFT ARM  Final   Special Requests   Final    BOTTLES DRAWN AEROBIC AND ANAEROBIC Blood Culture adequate volume   Culture   Final    NO GROWTH 2 DAYS Performed at [facility], [address], [place], [place] [address]    Report Status PENDING  Incomplete     Radiology Studies: CT HEAD WO CONTRAST ( )  Result Date: 09/15/2022 CLINICAL DATA:  Delirium. EXAM: CT HEAD WITHOUT CONTRAST TECHNIQUE: Contiguous axial images were obtained from the base of the skull through the vertex without intravenous contrast. RADIATION DOSE REDUCTION: This exam was performed according to the departmental dose-optimization program which includes automated exposure control, adjustment of the mA and/or kV according to patient size and/or use of iterative reconstruction technique. COMPARISON:  CT angio head without contrast 08/01/2022 FINDINGS: Brain: Mild atrophy and white matter changes are stable. Deep brain nuclei are within normal limits. The ventricles are of normal size. No significant extraaxial fluid collection is present. The brainstem and cerebellum are within normal limits. Midline structures are within normal limits. Vascular: Atherosclerotic calcifications present within the cavernous internal carotid arteries. No hyperdense vessel is present. Skull: Calvarium is intact. No focal lytic or blastic lesions are present. No significant extracranial soft tissue lesion is present. Sinuses/Orbits: A polyp or mucous retention cyst is again noted within the right maxillary sinus. Small polyps noted in the inferior right frontal sinus and anterior ethmoid air cells. The paranasal sinuses are otherwise clear. Mastoid air cells are clear. Polypoid lesions are noted in the nasal cavities bilaterally asymmetric soft tissue at the right nasal choana. Bilateral  lens replacements are noted. Globes and orbits are otherwise unremarkable. IMPRESSION: 1. No acute intracranial abnormality or significant interval change. 2. Stable atrophy and white matter disease. This likely reflects the sequela of chronic microvascular ischemia. 3. Polypoid lesions in the nasal cavities and paranasal sinuses bilaterally are stable. Electronically Signed   By: Cristal Deer  Mattern M.D.   On: 09/15/2022 16:25   US RENAL  Result Date: 09/15/2022 CLINICAL DATA:  [id number] with acute renal failure. EXAM: RENAL / URINARY TRACT ULTRASOUND COMPLETE COMPARISON:  CT without contrast 09/13/2022, CTA chest, abdomen and pelvis 08/01/2022 FINDINGS: Right Kidney: Renal measurements: 12.8 x 5.0 x 6.7 cm = volume: 230 mL. Echogenicity within normal limits. No mass or hydronephrosis visualized. Multiple anechoic thin-walled simple cysts are again noted, largest of these is 4.3 cm. No complex cysts are identified. Left Kidney: Renal measurements: 9.4 x 6.0 by 6.7 cm = volume: 192 mL. Echogenicity within normal limits. No mass or hydronephrosis visualized. Multiple anechoic thin walled simple cysts are again noted, largest of these is 4.1 cm. No complex cysts are identified. Bladder: Catheterized, contracted and obscured by bowel gas. Other: No free fluid. IMPRESSION: 1. No hydronephrosis or increased cortical echogenicity. 2. Multiple bilateral simple renal cysts. Electronically Signed   By: Almira Bar M.D.   On: 09/15/2022 06:06    Scheduled Meds:  Chlorhexidine Gluconate Cloth  6 each Topical Daily   levothyroxine  150 mcg Oral Q0600   simvastatin  20 mg Oral QHS   tamsulosin  0.4 mg Oral BID   Continuous Infusions:  cefTRIAXone (ROCEPHIN)  IV 1 g (09/16/22 0821)   sodium bicarbonate 150 mEq in sterile water 1,150 mL infusion 100 mL/hr at 09/16/22 0428     LOS: 2 days   Burnadette Pop, MD [facility] P6/06/2022, 11:19 AM

## 2022-09-16 NOTE — Progress Notes (Signed)
Pharmacy Antibiotic Note  [name] is a 77 y.o. male admitted on 09/14/2022 with Pseudomonas aeruginosa UTI.  Pharmacy has been consulted for Cefepime dosing.  Empirically initiated on Ceftriaxone for UTI 6/2 > 6/3. Urine culture now growing Pseudomonas aeruginosa. Blood cultures remain negative to date.   WBC 5.5, afebrile SCr trending down to 2.44  Plan: Discontinue Ceftriaxone Initiate Cefepime 2g IV q12h  Monitor daily CBC, temp, SCr, and for clinical signs of improvement  F/u cultures and de-escalate antibiotics as able    Height: 6\' 7"  (200.7 cm) Weight: 98.5 kg (217 lb 2.5 oz) IBW/kg (Calculated) : 93.7  Temp (24hrs), Avg:98.1 F (36.7 C), Min:97.8 F (36.6 C), Max:98.5 F (36.9 C)  Recent Labs  Lab 09/14/22 1740 09/14/22 1752 09/14/22 1906 09/15/22 0105 09/15/22 0129 09/16/22 0138  WBC 4.7  --   --   --  5.0 5.5  CREATININE 6.45* 7.20*  --  5.19* 5.06* 2.44*  LATICACIDVEN 0.8  --  1.6  --   --   --     Estimated Creatinine Clearance: 35.2 mL/min (A) (by C-G formula based on SCr of 2.44 mg/dL (H)).    Allergies  Allergen Reactions   Sulfa Antibiotics Other (See Comments)    Causes blisters and skin to peel from inside mouth    Antimicrobials this admission: Ceftriaxone 6/2 >> 6/3 Cefepime 6/3 >>   Dose adjustments this admission: N/A  Microbiology results: 6/1 BCx x2: NGTD x2 days 6/2 UCx: 40K col/mL Pseudomonas aeruginosa (sensitivities pending)   Thank you for allowing pharmacy to be a part of this patient's care.  Wilburn Cornelia, PharmD, BCPS Clinical Pharmacist 09/16/2022 3:14 PM   Please refer to AMION for pharmacy phone number

## 2022-09-16 NOTE — Progress Notes (Signed)
Requested pt to be changed to air mattress to help prevent skin breakdown. Pt educated on benefits but refusing to be placed on one due to noise states he doesn't even want to try it . MD notified    Everlean Cherry, RN

## 2022-09-17 ENCOUNTER — Ambulatory Visit: Payer: 59 | Admitting: Family Medicine

## 2022-09-17 DIAGNOSIS — J449 Chronic obstructive pulmonary disease, unspecified: Secondary | ICD-10-CM

## 2022-09-17 DIAGNOSIS — D638 Anemia in other chronic diseases classified elsewhere: Secondary | ICD-10-CM | POA: Insufficient documentation

## 2022-09-17 DIAGNOSIS — R748 Abnormal levels of other serum enzymes: Secondary | ICD-10-CM | POA: Diagnosis not present

## 2022-09-17 DIAGNOSIS — L8990 Pressure ulcer of unspecified site, unspecified stage: Secondary | ICD-10-CM | POA: Insufficient documentation

## 2022-09-17 DIAGNOSIS — R195 Other fecal abnormalities: Secondary | ICD-10-CM

## 2022-09-17 DIAGNOSIS — D649 Anemia, unspecified: Secondary | ICD-10-CM | POA: Diagnosis not present

## 2022-09-17 DIAGNOSIS — R197 Diarrhea, unspecified: Secondary | ICD-10-CM | POA: Diagnosis not present

## 2022-09-17 DIAGNOSIS — N179 Acute kidney failure, unspecified: Secondary | ICD-10-CM | POA: Diagnosis not present

## 2022-09-17 LAB — CBC
HCT: 25.9 % — ABNORMAL LOW (ref 39.0–52.0)
Hemoglobin: 8.5 g/dL — ABNORMAL LOW (ref 13.0–17.0)
MCH: 30.4 pg (ref 26.0–34.0)
MCHC: 32.8 g/dL (ref 30.0–36.0)
MCV: 92.5 fL (ref 80.0–100.0)
Platelets: 96 10*3/uL — ABNORMAL LOW (ref 150–400)
RBC: 2.8 MIL/uL — ABNORMAL LOW (ref 4.22–5.81)
RDW: 14 % (ref 11.5–15.5)
WBC: 6.3 10*3/uL (ref 4.0–10.5)
nRBC: 0 % (ref 0.0–0.2)

## 2022-09-17 LAB — BASIC METABOLIC PANEL
Anion gap: 14 (ref 5–15)
BUN: 41 mg/dL — ABNORMAL HIGH (ref 8–23)
CO2: 26 mmol/L (ref 22–32)
Calcium: 7.4 mg/dL — ABNORMAL LOW (ref 8.9–10.3)
Chloride: 100 mmol/L (ref 98–111)
Creatinine, Ser: 1.8 mg/dL — ABNORMAL HIGH (ref 0.61–1.24)
GFR, Estimated: 39 mL/min — ABNORMAL LOW (ref >60)
Glucose, Bld: 87 mg/dL (ref 70–99)
Potassium: 3.7 mmol/L (ref 3.5–5.1)
Sodium: 140 mmol/L (ref 135–145)

## 2022-09-17 LAB — CULTURE, BLOOD (ROUTINE X 2)

## 2022-09-17 LAB — URINE CULTURE: Culture: 40000 — AB

## 2022-09-17 LAB — LIPASE, BLOOD: Lipase: 209 U/L — ABNORMAL HIGH (ref 11–51)

## 2022-09-17 LAB — MAGNESIUM: Magnesium: 1.6 mg/dL — ABNORMAL LOW (ref 1.7–2.4)

## 2022-09-17 MED ORDER — CIPROFLOXACIN HCL 500 MG PO TABS
500.0000 mg | ORAL_TABLET | Freq: Two times a day (BID) | ORAL | Status: DC
  Administered 2022-09-17 – 2022-09-20 (×7): 500 mg via ORAL
  Filled 2022-09-17 (×8): qty 1

## 2022-09-17 MED ORDER — MAGNESIUM SULFATE 2 GM/50ML IV SOLN
2.0000 g | Freq: Once | INTRAVENOUS | Status: AC
  Administered 2022-09-17: 2 g via INTRAVENOUS
  Filled 2022-09-17: qty 50

## 2022-09-17 MED ORDER — GABAPENTIN 300 MG PO CAPS
300.0000 mg | ORAL_CAPSULE | Freq: Three times a day (TID) | ORAL | Status: DC
  Administered 2022-09-17 – 2022-09-20 (×10): 300 mg via ORAL
  Filled 2022-09-17 (×10): qty 1

## 2022-09-17 MED ORDER — ALUM & MAG HYDROXIDE-SIMETH 200-200-20 MG/5ML PO SUSP
15.0000 mL | ORAL | Status: DC | PRN
  Administered 2022-09-17: 15 mL via ORAL
  Filled 2022-09-17: qty 30

## 2022-09-17 MED ORDER — SODIUM CHLORIDE 0.9 % IV SOLN: INTRAVENOUS | Status: DC

## 2022-09-17 MED ORDER — DULOXETINE HCL 60 MG PO CPEP
60.0000 mg | ORAL_CAPSULE | Freq: Every day | ORAL | Status: DC
  Administered 2022-09-17 – 2022-09-20 (×4): 60 mg via ORAL
  Filled 2022-09-17 (×4): qty 1

## 2022-09-17 NOTE — Progress Notes (Addendum)
Patient ID: [name], male   DOB: 10-28-1945, 77 y.o.   MRN: [id number]    Progress Note   Subjective   Day # 3 CC; acute renal failure in setting of 5 weeks of nausea and vomiting, poor p.o. intake, reported diarrhea, normocytic anemia heme positive  CT abdomen and pelvis without contrast yesterday-bladder distended with small stones present no surrounding inflammation no ductal dilation unremarkable pancreas, punctate nonobstructing left renal calculus bilateral renal cysts sigmoid diverticulosis, 3.5 cm infrarenal abdominal aortic aneurysm, L1-L5 posterior fusion hardware  Labs-WBC 6.3/hemoglobin 8.5/crit 25.9 BUN 41/creatinine 1.80 improved Lipase 209 Mg 1.6  Patient says he does not feel well at all today feels somewhat short of breath, not eating much no complaints of abdominal pain.  No nausea or vomiting today, no diarrhea  On further discussion today patient now recalls that he has had prior colonoscopies-believes he has had at least 3 prior colonoscopies 2 of which would have been done when he was living in [place] and the last one probably 5 years ago.  He also recalls doing a Cologuard within the past couple of years which was negative.  The sees Cologuard June 2023 negative)   Objective   Vital signs in last 24 hours: Temp:  [97.5 F (36.4 C)-98.2 F (36.8 C)] 98.2 F (36.8 C) (06/04 0756) Pulse Rate:  [72-97] 87 (06/04 0756) Resp:  [13-20] 20 (06/04 0756) BP: (102-134)/(60-88) 105/60 (06/04 0756) SpO2:  [95 %-100 %] 100 % (06/04 0756) Weight:  [98.5 kg] 98.5 kg (06/04 0436) Last BM Date : 09/13/22 General:    Chronically ill-appearing older white male in NAD Heart:  Regular rate and rhythm; no murmurs Lungs: Respirations even and unlabored, wheezes Abdomen:  Soft, nontender and nondistended. Normal bowel sounds. Extremities:  Without edema. Neurologic:  Alert and oriented,  grossly normal neurologically. Psych:  Cooperative. Normal mood and  affect.  Intake/Output from previous day: 06/03 0701 - 06/04 0700 In: 1218.6 [I.V.:965.9; IV Piggyback:252.8] Out: 900 [Urine:900] Intake/Output this shift: No intake/output data recorded.  Lab Results: Recent Labs    09/15/22 0129 09/16/22 0138 09/17/22 0231  WBC 5.0 5.5 6.3  HGB 9.0* 9.0* 8.5*  HCT 28.0* 27.0* 25.9*  PLT 97* 113* 96*   BMET Recent Labs    09/15/22 0129 09/16/22 0138 09/17/22 0231  NA 139 140 140  K 5.0 4.2 3.7  CL 113* 109 100  CO2 16* 19* 26  GLUCOSE 95 80 87  BUN 96* 60* 41*  CREATININE 5.06* 2.44* 1.80*  CALCIUM 8.1* 7.7* 7.4*   LFT Recent Labs    09/15/22 0129  PROT 5.5*  ALBUMIN 2.8*  AST 11*  ALT 7  ALKPHOS 67  BILITOT 0.9   PT/INR Recent Labs    09/14/22 1740 09/15/22 0129  LABPROT 16.5* 14.7  INR 1.3* 1.1    Studies/Results: CT ABDOMEN PELVIS WO CONTRAST  Result Date: 09/16/2022 CLINICAL DATA:  Nonbilious vomiting. EXAM: CT ABDOMEN AND PELVIS WITHOUT CONTRAST TECHNIQUE: Multidetector CT imaging of the abdomen and pelvis was performed following the standard protocol without IV contrast. RADIATION DOSE REDUCTION: This exam was performed according to the departmental dose-optimization program which includes automated exposure control, adjustment of the mA and/or kV according to patient size and/or use of iterative reconstruction technique. COMPARISON:  CT angiogram chest abdomen and pelvis 08/01/2022 FINDINGS: Lower chest: No acute abnormality. Hepatobiliary: Gallbladder is distended and small gallstones are present. There is no surrounding inflammation. Bile ducts and liver appear within normal limits. Pancreas:  Unremarkable. No pancreatic ductal dilatation or surrounding inflammatory changes. Spleen: Normal in size without focal abnormality. Adrenals/Urinary Tract: There is a punctate nonobstructing left renal calculus. Bilateral renal cysts are present measuring up to 4.9 cm. There is no hydronephrosis. The adrenal glands are within  normal limits. There is a Foley catheter in the bladder. Air in the bladder is likely related to Foley catheter. Stomach/Bowel: Stomach is within normal limits. Appendix appears normal. No evidence of bowel wall thickening, distention, or inflammatory changes. There is sigmoid colon diverticulosis. Vascular/Lymphatic: Aortic atherosclerosis. 3.5 cm infrarenal abdominal aortic aneurysm is unchanged. No enlarged abdominal or pelvic lymph nodes. Reproductive: Prostate is unremarkable. Other: No abdominal wall hernia or abnormality. No abdominopelvic ascites. Musculoskeletal: L1-L5 posterior fusion hardware is present. There are degenerative changes of both hips. The bones are osteopenic. IMPRESSION: 1. Cholelithiasis. 2. Nonobstructing left renal calculus. 3. 3.5 cm infrarenal abdominal aortic aneurysm. Recommend follow-up every 2 years. 4. Bosniak I benign renal cyst measuring 4.9 cm. No follow-up imaging is recommended. JACR 2018 Feb; 264-273, Management of the Incidental Renal Mass on CT, [employer] 2021; [id number], Bosniak Classification of Cystic Renal Masses, Version 2019. Aortic Atherosclerosis (ICD10-I70.0). Electronically Signed   By: Darliss Cheney M.D.   On: 09/16/2022 17:49   CT HEAD WO CONTRAST ( )  Result Date: 09/15/2022 CLINICAL DATA:  Delirium. EXAM: CT HEAD WITHOUT CONTRAST TECHNIQUE: Contiguous axial images were obtained from the base of the skull through the vertex without intravenous contrast. RADIATION DOSE REDUCTION: This exam was performed according to the departmental dose-optimization program which includes automated exposure control, adjustment of the mA and/or kV according to patient size and/or use of iterative reconstruction technique. COMPARISON:  CT angio head without contrast 08/01/2022 FINDINGS: Brain: Mild atrophy and white matter changes are stable. Deep brain nuclei are within normal limits. The ventricles are of normal size. No significant extraaxial fluid collection is  present. The brainstem and cerebellum are within normal limits. Midline structures are within normal limits. Vascular: Atherosclerotic calcifications present within the cavernous internal carotid arteries. No hyperdense vessel is present. Skull: Calvarium is intact. No focal lytic or blastic lesions are present. No significant extracranial soft tissue lesion is present. Sinuses/Orbits: A polyp or mucous retention cyst is again noted within the right maxillary sinus. Small polyps noted in the inferior right frontal sinus and anterior ethmoid air cells. The paranasal sinuses are otherwise clear. Mastoid air cells are clear. Polypoid lesions are noted in the nasal cavities bilaterally asymmetric soft tissue at the right nasal choana. Bilateral lens replacements are noted. Globes and orbits are otherwise unremarkable. IMPRESSION: 1. No acute intracranial abnormality or significant interval change. 2. Stable atrophy and white matter disease. This likely reflects the sequela of chronic microvascular ischemia. 3. Polypoid lesions in the nasal cavities and paranasal sinuses bilaterally are stable. Electronically Signed   By: Marin Roberts M.D.   On: 09/15/2022 16:25       Assessment / Plan:    #21 77 year old white male who had presented to the emergency room at an outside facility with complaints of nausea vomiting, and poor oral intake for 3 to 4 weeks there is also been some report of diarrhea at home. 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 He was also found to have a normocytic anemia with hemoglobin of 8 mild thrombocytopenia   significant improvement in acute  kidney injury since admission  CT yesterday noncontrasted as above normal-appearing pancreas, he does have gallstones and somewhat distended gallbladder but no ductal  dilation or gallbladder wall thickening.  Diarrhea has resolved  Anemia is stable, no iron deficiency but documented heme positive  Patient has had prior colonoscopies by further discussion today he does not believe he has history of polyps, negative Cologuard 2023   The anemia may be multifactorial with underlying chronic kidney disease.   #2 COPD #3 history of hypertension #4.  Hypothyroidism #5  elevated lipase-patient has now had 2 noncontrasted CT scans with no evidence of pancreatic abnormality, elevated lipase can sometimes be seen in acute renal failure   Plan ; I do not think a patient will tolerate a bowel prep at this time and now with you knowledge that he has had prior colonoscopies serially and a negative Cologuard last year I think we can plan to do EGD tomorrow with Dr. Lavon Paganini.  Procedure was discussed in detail with the patient including indications risk and benefits and he is agreeable to proceed. Continue twice daily PPI Continue to trend hemoglobin. N.p.o. after midnight         Principal Problem:   ARF (acute renal failure) (HCC) Active Problems:   Essential hypertension   BPH (benign prostatic hyperplasia)   Acquired hypothyroidism   Hypomagnesemia   Hyperkalemia   Acute on chronic anemia   Nausea vomiting and diarrhea   Hyperlipidemia   Chronic diastolic CHF (congestive heart failure) (HCC)   Nausea and vomiting   Normocytic anemia     LOS: 3 days   Amy Esterwood PA-C 09/17/2022, 10:44 AM    Attending physician's note   I have taken a history, reviewed the chart and examined the patient. I performed a substantive portion of this encounter, including complete performance of at least one of the key components, in conjunction with the APP. I agree with the APP's note, impression and recommendations.   77 year old very pleasant male admitted with acute renal failure with decreased appetite, nausea and intractable vomiting with normocytic  anemia, fecal Hemoccult positive  He had negative cologaurd in June 2023.  Unable to locate prior colonoscopy reports but per patient he had 2-3 colonoscopies in the past and most recent was approximately 5 years ago  Will plan to proceed with EGD for further evaluation of his GI symptoms, exclude gastroduodenal ulcer or neoplastic lesion Elevated lipase likely in the setting of acute kidney injury, clinically has no signs of acute pancreatitis  N.p.o. after midnight Continue PPI twice daily   The patient was provided an opportunity to ask questions and all were answered. The patient agreed with the plan and demonstrated an understanding of the instructions.   Iona Beard , MD [phone]

## 2022-09-17 NOTE — Progress Notes (Signed)
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  Decreased oral intake.  On presentation, he was hypotensive, lab work showed creatinine of 11.4, hemoglobin of 8.  Patient was also given a unit of PRBC at [facility].  Patient admitted for further management.  GI consulted and following, plan for EGD tomorrow.  Kidney function significantly improving with IV fluid  Assessment & Plan:  Principal Problem:   ARF (acute renal failure) (HCC) Active Problems:   Essential hypertension   BPH (benign prostatic hyperplasia)   Acquired hypothyroidism   Hypomagnesemia   Hyperkalemia   Acute on chronic anemia   Nausea vomiting and diarrhea   Hyperlipidemia   Chronic diastolic CHF (congestive heart failure) (HCC)   Nausea and vomiting   Normocytic anemia  AKI/hyperkalemia: Previous creatinine was 1.1 as per 07/31/2022.  Presented with creatinine in the range of 11.  This is most likely prerenal AKI because of dehydration from vomiting and diarrhea.  Ultrasound of the kidneys did not show any hydronephrosis but showed bilateral simple renal cyst.  Kidney function expected to recover with IV fluids.. Patient also takes benazepril at home.  Home benazepril, Bactrim on hold  Normocytic anemia: Baseline hemoglobin ranges from 9-12.  Presented with hemoglobin in the range of 8.  No report of hematochezia or melena.  DRE performed in the emergency department showed brown-colored stool.But FOBT came out to be positive.  He was also given unit of PRBC at [facility].  Currently hemoglobin in the range of 9.   Iron level  optimal. Due to history of diarrhea, vomiting , elevated lipase, positive FOBT, we consulted GI,plan for EGD tomorrow  Nausea, vomiting, diarrhea/elevated lipase: Ongoing for last 5 weeks.  No fever or leukocytosis.  GI pathogen panel/C diff was sent. Elevated lipase of 600s.  Patient denies any abdominal pain but is nauseous and vomiting today. abdomen is soft, nondistended, nontender, has good bowel sounds.  CT abdomen/pelvis done on 09/13/2022 at [facility] could not find a specific cause for patient's nausea and vomiting with no acute findings, unremarkable pancreas. Repeat CT abdomen/pelvis today without contrast did not show acute findings.  Nausea, vomiting have resolved today.  Diarrhea has also resolved  History of chronic UTI: UA showed plenty of bacteria, leukocytes.  On chronic Bactrim therapy at home.  Urine culture showed Pseudomonas.  Currently on oral Cipro  Hypothyroidism: Continue Synthyroid  BPH: Takes Flomax  Hypertension: Takes benazepril at home.  Currently on hold  Hyperlipidemia: On simvastatin as an outpatient.  Hypomagnesemia: Supplemented with magnesium.  Continue to monitor levels  Chronic diastolic CHF: Last echo as per October 2020 showed EF of 60 to 65%, impaired relaxation.  He was dehydrated on presentation and is being given IV fluid  Infrarenal abdominal aortic aneurysm: CT showed  3.6 cm in diameter, stable . Recommend follow-up ultrasound every 2 years   Confusion/debility/disposition: Patient looks confused.  He is alert and awake and oriented to place only.As per stepdaughter he sleeps all the time,not that ambulatory.daughter said this all started after a back surgery about 9  months ago. He was walking fine before surgery.He missed his appointments.As per the stepdaughter,he is usually  alert and oriented at baseline. CT of the head without any acute findings.  Normal ammonia level.  PT/OT consulted for generalized weakness, recommended SNF on  discharge.Vit B12 level more than level,TSH and folate level normal.       Pressure Injury 09/14/22 Buttocks Right Stage 2 -  Partial thickness loss of dermis presenting as a shallow open injury with a red, pink wound bed without slough. pink (Active)  09/14/22 2131  Location: Buttocks  Location Orientation: Right  Staging: Stage 2 -  Partial thickness loss of dermis presenting as a shallow open injury with a red, pink wound bed without slough.  Wound Description (Comments): pink  Present on Admission: Yes  Dressing Type Foam - Lift dressing to assess site every shift 09/17/22 0845    DVT prophylaxis:SCDs Start: 09/14/22 1934     Code Status: Full Code  Family Communication: Called and discussed with daughter on 6/2.:  Discussed with wife on phone on 6/3  Patient status:Inpatient  Patient is from :Home  Anticipated discharge to: SNF  Estimated DC date:2-3 days   Consultants: GI  Procedures:None  Antimicrobials:  Anti-infectives (From admission, onward)    Start     Dose/Rate Route Frequency Ordered Stop   09/16/22 1600  ceFEPIme (MAXIPIME) 2 g in sodium chloride 0.9 % 100 mL IVPB        2 g 200 mL/hr over 30 Minutes Intravenous Every 12 hours 09/16/22 1509     09/15/22 0900  cefTRIAXone (ROCEPHIN) 1 g in sodium chloride 0.9 % 100 mL IVPB  Status:  Discontinued        1 g 200 mL/hr over 30 Minutes Intravenous Daily 09/15/22 0821 09/16/22 1508       Subjective: Patient seen and examined at bedside today.  Looks comfortable.  Hemodynamically stable.  Alert and awake and oriented to place but not to time.  Not in any Distress.  No nausea or vomiting or abdominal pain today.  Objective: Vitals:   09/17/22 0436 09/17/22 0529 09/17/22 0756 09/17/22 1117  BP:   105/60 96/68  Pulse:  72 87 87  Resp:  20 20 17   Temp:   98.2 F (36.8 C) 98.1 F (36.7 C)  TempSrc:   Oral Oral  SpO2:  100% 100% 99%  Weight: 98.5 kg     Height:        Intake/Output Summary (Last 24  hours) at 09/17/2022 1214 Last data filed at 09/16/2022 1600 Gross per 24 hour  Intake 1218.61 ml  Output --  Net 1218.61 ml   Filed Weights   09/15/22 0500 09/16/22 0310 09/17/22 0436  Weight: 99.3 kg 98.5 kg 98.5 kg    Examination:  General exam: Overall comfortable, not in distress, appears weak and deconditioned HEENT: PERRL Respiratory system:  no wheezes or crackles  Cardiovascular system: S1 & S2 heard, RRR.  Gastrointestinal system: Abdomen is nondistended, soft and nontender. Central nervous system: Alert and awake, oriented to place only Extremities: No edema, no clubbing ,no cyanosis Skin: No rashes, no ulcers,no icterus GU: Foley   Data Reviewed: I have personally reviewed following labs and imaging studies  CBC: Recent Labs  Lab 09/14/22 1740 09/14/22 1751 09/14/22 1752 09/15/22 0105 09/15/22 0129 09/16/22 0138 09/17/22 0231  WBC 4.7  --   --   --  5.0 5.5 6.3  NEUTROABS 3.9  --   --   --  4.1  --   --   HGB 8.8*   < > 8.2* 8.9*  9.0* 9.0* 8.5*  HCT 27.4*   < > 24.0* 28.2* 28.0* 27.0* 25.9*  MCV 97.2  --   --   --  94.0 93.4 92.5  PLT 92*  --   --   --  97* 113* 96*   < > = values in this interval not displayed.   Basic Metabolic Panel: Recent Labs  Lab 09/14/22 1740 09/14/22 1751 09/14/22 1752 09/15/22 0105 09/15/22 0129 09/16/22 0138 09/17/22 0231  NA 140   < > 143 140 139 140 140  K 5.3*   < > 5.4* 5.3* 5.0 4.2 3.7  CL 112*  --  116* 114* 113* 109 100  CO2 13*  --   --  16* 16* 19* 26  GLUCOSE 96  --  91 93 95 80 87  BUN 116*  --  >130* 98* 96* 60* 41*  CREATININE 6.45*  --  7.20* 5.19* 5.06* 2.44* 1.80*  CALCIUM 8.1*  --   --  8.1* 8.1* 7.7* 7.4*  MG 1.1*  --   --   --  1.5* 1.3* 1.6*  PHOS 4.8*  --   --   --  4.2  --   --    < > = values in this interval not displayed.     Recent Results (from the past 240 hour(s))  Blood culture (routine x 2)     Status: None (Preliminary result)   Collection Time: 09/14/22  5:40 PM   Specimen:  BLOOD  Result Value Ref Range Status   Specimen Description BLOOD LEFT ARM  Final   Special Requests   Final    BOTTLES DRAWN AEROBIC AND ANAEROBIC Blood Culture results may not be optimal due to an excessive volume of blood received in culture bottles   Culture   Final    NO GROWTH 2 DAYS Performed at [facility], [address] [place], [place] [address]    Report Status PENDING  Incomplete  Blood culture (routine x 2)     Status: None (Preliminary result)   Collection Time: 09/14/22  7:34 PM   Specimen: BLOOD  Result Value Ref Range Status   Specimen Description BLOOD LEFT ARM  Final   Special Requests   Final    BOTTLES DRAWN AEROBIC AND ANAEROBIC Blood Culture adequate volume   Culture   Final    NO GROWTH 2 DAYS Performed at [facility], [address] [place], [place] [address]    Report Status PENDING  Incomplete  Urine Culture (for pregnant, neutropenic or urologic patients or patients with an indwelling urinary catheter)     Status: Abnormal   Collection Time: 09/15/22 12:19 PM   Specimen: Urine, Clean Catch  Result Value Ref Range Status   Specimen Description URINE, CLEAN CATCH  Final   Special Requests   Final    NONE Performed at [facility], [address], [place], [place] [address]    Culture 40,000 COLONIES/mL PSEUDOMONAS AERUGINOSA (A)  Final   Report Status 09/17/2022 FINAL  Final   Organism ID, Bacteria PSEUDOMONAS AERUGINOSA (A)  Final      Susceptibility   Pseudomonas aeruginosa - MIC*    CEFTAZIDIME 4 SENSITIVE Sensitive     CIPROFLOXACIN <=0.25 SENSITIVE Sensitive     GENTAMICIN <=1 SENSITIVE Sensitive     IMIPENEM <=0.25 SENSITIVE Sensitive     PIP/TAZO 8 SENSITIVE Sensitive     CEFEPIME 2 SENSITIVE Sensitive     * 40,000  COLONIES/mL PSEUDOMONAS AERUGINOSA     Radiology Studies: CT ABDOMEN PELVIS WO CONTRAST  Result Date: 09/16/2022 CLINICAL DATA:  Nonbilious vomiting. EXAM: CT ABDOMEN AND PELVIS WITHOUT CONTRAST  TECHNIQUE: Multidetector CT imaging of the abdomen and pelvis was performed following the standard protocol without IV contrast. RADIATION DOSE REDUCTION: This exam was performed according to the departmental dose-optimization program which includes automated exposure control, adjustment of the mA and/or kV according to patient size and/or use of iterative reconstruction technique. COMPARISON:  CT angiogram chest abdomen and pelvis 08/01/2022 FINDINGS: Lower chest: No acute abnormality. Hepatobiliary: Gallbladder is distended and small gallstones are present. There is no surrounding inflammation. Bile ducts and liver appear within normal limits. Pancreas: Unremarkable. No pancreatic ductal dilatation or surrounding inflammatory changes. Spleen: Normal in size without focal abnormality. Adrenals/Urinary Tract: There is a punctate nonobstructing left renal calculus. Bilateral renal cysts are present measuring up to 4.9 cm. There is no hydronephrosis. The adrenal glands are within normal limits. There is a Foley catheter in the bladder. Air in the bladder is likely related to Foley catheter. Stomach/Bowel: Stomach is within normal limits. Appendix appears normal. No evidence of bowel wall thickening, distention, or inflammatory changes. There is sigmoid colon diverticulosis. Vascular/Lymphatic: Aortic atherosclerosis. 3.5 cm infrarenal abdominal aortic aneurysm is unchanged. No enlarged abdominal or pelvic lymph nodes. Reproductive: Prostate is unremarkable. Other: No abdominal wall hernia or abnormality. No abdominopelvic ascites. Musculoskeletal: L1-L5 posterior fusion hardware is present. There are degenerative changes of both hips. The bones are osteopenic. IMPRESSION: 1. Cholelithiasis. 2. Nonobstructing left renal calculus. 3. 3.5 cm infrarenal abdominal aortic aneurysm. Recommend follow-up every 2 years. 4. Bosniak I benign renal cyst measuring 4.9 cm. No follow-up imaging is recommended. JACR 2018 Feb; 264-273,  Management of the Incidental Renal Mass on CT, [employer] 2021; [id number], Bosniak Classification of Cystic Renal Masses, Version 2019. Aortic Atherosclerosis (ICD10-I70.0). Electronically Signed   By: Darliss Cheney M.D.   On: 09/16/2022 17:49   CT HEAD WO CONTRAST ( )  Result Date: 09/15/2022 CLINICAL DATA:  Delirium. EXAM: CT HEAD WITHOUT CONTRAST TECHNIQUE: Contiguous axial images were obtained from the base of the skull through the vertex without intravenous contrast. RADIATION DOSE REDUCTION: This exam was performed according to the departmental dose-optimization program which includes automated exposure control, adjustment of the mA and/or kV according to patient size and/or use of iterative reconstruction technique. COMPARISON:  CT angio head without contrast 08/01/2022 FINDINGS: Brain: Mild atrophy and white matter changes are stable. Deep brain nuclei are within normal limits. The ventricles are of normal size. No significant extraaxial fluid collection is present. The brainstem and cerebellum are within normal limits. Midline structures are within normal limits. Vascular: Atherosclerotic calcifications present within the cavernous internal carotid arteries. No hyperdense vessel is present. Skull: Calvarium is intact. No focal lytic or blastic lesions are present. No significant extracranial soft tissue lesion is present. Sinuses/Orbits: A polyp or mucous retention cyst is again noted within the right maxillary sinus. Small polyps noted in the inferior right frontal sinus and anterior ethmoid air cells. The paranasal sinuses are otherwise clear. Mastoid air cells are clear. Polypoid lesions are noted in the nasal cavities bilaterally asymmetric soft tissue at the right nasal choana. Bilateral lens replacements are noted. Globes and orbits are otherwise unremarkable. IMPRESSION: 1. No acute intracranial abnormality or significant interval change. 2. Stable atrophy and white matter disease. This likely  reflects the sequela of chronic microvascular ischemia. 3. Polypoid lesions in the nasal cavities and paranasal sinuses bilaterally  are stable. Electronically Signed   By: Marin Roberts M.D.   On: 09/15/2022 16:25    Scheduled Meds:  Chlorhexidine Gluconate Cloth  6 each Topical Daily   levothyroxine  150 mcg Oral Q0600   pantoprazole (PROTONIX) IV  40 mg Intravenous Q24H   simvastatin  20 mg Oral QHS   tamsulosin  0.4 mg Oral BID   Continuous Infusions:  ceFEPime (MAXIPIME) IV 2 g (09/17/22 0354)   sodium bicarbonate 150 mEq in sterile water 1,150 mL infusion 100 mL/hr at 09/17/22 0923     LOS: 3 days   Burnadette Pop, MD [facility] P6/07/2022, 12:14 PM

## 2022-09-17 NOTE — Care Management Important Message (Signed)
Important Message  Patient Details  Name: [name] MRN: [id number] Date of Birth: 12/29/1945   Medicare Important Message Given:  Yes     Renie Ora 09/17/2022, 8:13 AM

## 2022-09-17 NOTE — H&P (View-Only) (Signed)
Patient ID: [name], male   DOB: 08/09/1945, 76 y.o.   MRN: [id number]    Progress Note   Subjective   Day # 3 CC; acute renal failure in setting of 5 weeks of nausea and vomiting, poor p.o. intake, reported diarrhea, normocytic anemia heme positive  CT abdomen and pelvis without contrast yesterday-bladder distended with small stones present no surrounding inflammation no ductal dilation unremarkable pancreas, punctate nonobstructing left renal calculus bilateral renal cysts sigmoid diverticulosis, 3.5 cm infrarenal abdominal aortic aneurysm, L1-L5 posterior fusion hardware  Labs-WBC 6.3/hemoglobin 8.5/crit 25.9 BUN 41/creatinine 1.80 improved Lipase 209 Mg 1.6  Patient says he does not feel well at all today feels somewhat short of breath, not eating much no complaints of abdominal pain.  No nausea or vomiting today, no diarrhea  On further discussion today patient now recalls that he has had prior colonoscopies-believes he has had at least 3 prior colonoscopies 2 of which would have been done when he was living in [place] and the last one probably 5 years ago.  He also recalls doing a Cologuard within the past couple of years which was negative.  The sees Cologuard June 2023 negative)   Objective   Vital signs in last 24 hours: Temp:  [97.5 F (36.4 C)-98.2 F (36.8 C)] 98.2 F (36.8 C) (06/04 0756) Pulse Rate:  [72-97] 87 (06/04 0756) Resp:  [13-20] 20 (06/04 0756) BP: (102-134)/(60-88) 105/60 (06/04 0756) SpO2:  [95 %-100 %] 100 % (06/04 0756) Weight:  [98.5 kg] 98.5 kg (06/04 0436) Last BM Date : 09/13/22 General:    Chronically ill-appearing older white male in NAD Heart:  Regular rate and rhythm; no murmurs Lungs: Respirations even and unlabored, wheezes Abdomen:  Soft, nontender and nondistended. Normal bowel sounds. Extremities:  Without edema. Neurologic:  Alert and oriented,  grossly normal neurologically. Psych:  Cooperative. Normal mood and  affect.  Intake/Output from previous day: 06/03 0701 - 06/04 0700 In: 1218.6 [I.V.:965.9; IV Piggyback:252.8] Out: 900 [Urine:900] Intake/Output this shift: No intake/output data recorded.  Lab Results: Recent Labs    09/15/22 0129 09/16/22 0138 09/17/22 0231  WBC 5.0 5.5 6.3  HGB 9.0* 9.0* 8.5*  HCT 28.0* 27.0* 25.9*  PLT 97* 113* 96*   BMET Recent Labs    09/15/22 0129 09/16/22 0138 09/17/22 0231  NA 139 140 140  K 5.0 4.2 3.7  CL 113* 109 100  CO2 16* 19* 26  GLUCOSE 95 80 87  BUN 96* 60* 41*  CREATININE 5.06* 2.44* 1.80*  CALCIUM 8.1* 7.7* 7.4*   LFT Recent Labs    09/15/22 0129  PROT 5.5*  ALBUMIN 2.8*  AST 11*  ALT 7  ALKPHOS 67  BILITOT 0.9   PT/INR Recent Labs    09/14/22 1740 09/15/22 0129  LABPROT 16.5* 14.7  INR 1.3* 1.1    Studies/Results: CT ABDOMEN PELVIS WO CONTRAST  Result Date: 09/16/2022 CLINICAL DATA:  Nonbilious vomiting. EXAM: CT ABDOMEN AND PELVIS WITHOUT CONTRAST TECHNIQUE: Multidetector CT imaging of the abdomen and pelvis was performed following the standard protocol without IV contrast. RADIATION DOSE REDUCTION: This exam was performed according to the departmental dose-optimization program which includes automated exposure control, adjustment of the mA and/or kV according to patient size and/or use of iterative reconstruction technique. COMPARISON:  CT angiogram chest abdomen and pelvis 08/01/2022 FINDINGS: Lower chest: No acute abnormality. Hepatobiliary: Gallbladder is distended and small gallstones are present. There is no surrounding inflammation. Bile ducts and liver appear within normal limits. Pancreas:   Unremarkable. No pancreatic ductal dilatation or surrounding inflammatory changes. Spleen: Normal in size without focal abnormality. Adrenals/Urinary Tract: There is a punctate nonobstructing left renal calculus. Bilateral renal cysts are present measuring up to 4.9 cm. There is no hydronephrosis. The adrenal glands are within  normal limits. There is a Foley catheter in the bladder. Air in the bladder is likely related to Foley catheter. Stomach/Bowel: Stomach is within normal limits. Appendix appears normal. No evidence of bowel wall thickening, distention, or inflammatory changes. There is sigmoid colon diverticulosis. Vascular/Lymphatic: Aortic atherosclerosis. 3.5 cm infrarenal abdominal aortic aneurysm is unchanged. No enlarged abdominal or pelvic lymph nodes. Reproductive: Prostate is unremarkable. Other: No abdominal wall hernia or abnormality. No abdominopelvic ascites. Musculoskeletal: L1-L5 posterior fusion hardware is present. There are degenerative changes of both hips. The bones are osteopenic. IMPRESSION: 1. Cholelithiasis. 2. Nonobstructing left renal calculus. 3. 3.5 cm infrarenal abdominal aortic aneurysm. Recommend follow-up every 2 years. 4. Bosniak I benign renal cyst measuring 4.9 cm. No follow-up imaging is recommended. JACR 2018 Feb; 264-273, Management of the Incidental Renal Mass on CT, [employer] 2021; 814-848, Bosniak Classification of Cystic Renal Masses, Version 2019. Aortic Atherosclerosis (ICD10-I70.0). Electronically Signed   By: Amy  Guttmann M.D.   On: 09/16/2022 17:49   CT HEAD WO CONTRAST (5MM)  Result Date: 09/15/2022 CLINICAL DATA:  Delirium. EXAM: CT HEAD WITHOUT CONTRAST TECHNIQUE: Contiguous axial images were obtained from the base of the skull through the vertex without intravenous contrast. RADIATION DOSE REDUCTION: This exam was performed according to the departmental dose-optimization program which includes automated exposure control, adjustment of the mA and/or kV according to patient size and/or use of iterative reconstruction technique. COMPARISON:  CT angio head without contrast 08/01/2022 FINDINGS: Brain: Mild atrophy and white matter changes are stable. Deep brain nuclei are within normal limits. The ventricles are of normal size. No significant extraaxial fluid collection is  present. The brainstem and cerebellum are within normal limits. Midline structures are within normal limits. Vascular: Atherosclerotic calcifications present within the cavernous internal carotid arteries. No hyperdense vessel is present. Skull: Calvarium is intact. No focal lytic or blastic lesions are present. No significant extracranial soft tissue lesion is present. Sinuses/Orbits: A polyp or mucous retention cyst is again noted within the right maxillary sinus. Small polyps noted in the inferior right frontal sinus and anterior ethmoid air cells. The paranasal sinuses are otherwise clear. Mastoid air cells are clear. Polypoid lesions are noted in the nasal cavities bilaterally asymmetric soft tissue at the right nasal choana. Bilateral lens replacements are noted. Globes and orbits are otherwise unremarkable. IMPRESSION: 1. No acute intracranial abnormality or significant interval change. 2. Stable atrophy and white matter disease. This likely reflects the sequela of chronic microvascular ischemia. 3. Polypoid lesions in the nasal cavities and paranasal sinuses bilaterally are stable. Electronically Signed   By: Christopher  Mattern M.D.   On: 09/15/2022 16:25       Assessment / Plan:    #1 76-year-old white male who had presented to the emergency room at an outside facility with complaints of nausea vomiting, and poor oral intake for 3 to 4 weeks there is also been some report of diarrhea at home. 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 He was also found to have a normocytic anemia with hemoglobin of 8 mild thrombocytopenia   significant improvement in acute   kidney injury since admission  CT yesterday noncontrasted as above normal-appearing pancreas, he does have gallstones and somewhat distended gallbladder but no ductal  dilation or gallbladder wall thickening.  Diarrhea has resolved  Anemia is stable, no iron deficiency but documented heme positive  Patient has had prior colonoscopies by further discussion today he does not believe he has history of polyps, negative Cologuard 2023   The anemia may be multifactorial with underlying chronic kidney disease.   #2 COPD #3 history of hypertension #4.  Hypothyroidism #5  elevated lipase-patient has now had 2 noncontrasted CT scans with no evidence of pancreatic abnormality, elevated lipase can sometimes be seen in acute renal failure   Plan ; I do not think a patient will tolerate a bowel prep at this time and now with you knowledge that he has had prior colonoscopies serially and a negative Cologuard last year I think we can plan to do EGD tomorrow with Dr. Jahshua Bonito.  Procedure was discussed in detail with the patient including indications risk and benefits and he is agreeable to proceed. Continue twice daily PPI Continue to trend hemoglobin. N.p.o. after midnight         Principal Problem:   ARF (acute renal failure) (HCC) Active Problems:   Essential hypertension   BPH (benign prostatic hyperplasia)   Acquired hypothyroidism   Hypomagnesemia   Hyperkalemia   Acute on chronic anemia   Nausea vomiting and diarrhea   Hyperlipidemia   Chronic diastolic CHF (congestive heart failure) (HCC)   Nausea and vomiting   Normocytic anemia     LOS: 3 days   Amy Esterwood PA-C 09/17/2022, 10:44 AM    Attending physician's note   I have taken a history, reviewed the chart and examined the patient. I performed a substantive portion of this encounter, including complete performance of at least one of the key components, in conjunction with the APP. I agree with the APP's note, impression and recommendations.   76-year-old very pleasant male admitted with acute renal failure with decreased appetite, nausea and intractable vomiting with normocytic  anemia, fecal Hemoccult positive  He had negative cologaurd in June 2023.  Unable to locate prior colonoscopy reports but per patient he had 2-3 colonoscopies in the past and most recent was approximately 5 years ago  Will plan to proceed with EGD for further evaluation of his GI symptoms, exclude gastroduodenal ulcer or neoplastic lesion Elevated lipase likely in the setting of acute kidney injury, clinically has no signs of acute pancreatitis  N.p.o. after midnight Continue PPI twice daily   The patient was provided an opportunity to ask questions and all were answered. The patient agreed with the plan and demonstrated an understanding of the instructions.   K. Veena Cottrell Gentles , MD [phone]    

## 2022-09-18 ENCOUNTER — Inpatient Hospital Stay (HOSPITAL_COMMUNITY): Payer: 59

## 2022-09-18 ENCOUNTER — Inpatient Hospital Stay (HOSPITAL_COMMUNITY): Payer: 59 | Admitting: Certified Registered Nurse Anesthetist

## 2022-09-18 ENCOUNTER — Encounter (HOSPITAL_COMMUNITY)
Admission: EM | Disposition: A | Discharge status: Skilled Nursing Facility | Payer: Self-pay | Source: Home / Self Care | Attending: Internal Medicine

## 2022-09-18 ENCOUNTER — Encounter (HOSPITAL_COMMUNITY): Payer: Self-pay | Admitting: Internal Medicine

## 2022-09-18 DIAGNOSIS — K2950 Unspecified chronic gastritis without bleeding: Secondary | ICD-10-CM

## 2022-09-18 DIAGNOSIS — F17210 Nicotine dependence, cigarettes, uncomplicated: Secondary | ICD-10-CM

## 2022-09-18 DIAGNOSIS — D649 Anemia, unspecified: Secondary | ICD-10-CM

## 2022-09-18 DIAGNOSIS — R748 Abnormal levels of other serum enzymes: Secondary | ICD-10-CM | POA: Diagnosis not present

## 2022-09-18 DIAGNOSIS — R195 Other fecal abnormalities: Secondary | ICD-10-CM

## 2022-09-18 DIAGNOSIS — K2970 Gastritis, unspecified, without bleeding: Secondary | ICD-10-CM

## 2022-09-18 DIAGNOSIS — J449 Chronic obstructive pulmonary disease, unspecified: Secondary | ICD-10-CM

## 2022-09-18 DIAGNOSIS — R197 Diarrhea, unspecified: Secondary | ICD-10-CM | POA: Diagnosis not present

## 2022-09-18 DIAGNOSIS — I509 Heart failure, unspecified: Secondary | ICD-10-CM

## 2022-09-18 DIAGNOSIS — I110 Hypertensive heart disease with heart failure: Secondary | ICD-10-CM

## 2022-09-18 DIAGNOSIS — N179 Acute kidney failure, unspecified: Secondary | ICD-10-CM | POA: Diagnosis not present

## 2022-09-18 DIAGNOSIS — K2990 Gastroduodenitis, unspecified, without bleeding: Secondary | ICD-10-CM

## 2022-09-18 HISTORY — PX: BIOPSY: SHX5522

## 2022-09-18 HISTORY — PX: ESOPHAGOGASTRODUODENOSCOPY (EGD) WITH PROPOFOL: SHX5813

## 2022-09-18 LAB — BASIC METABOLIC PANEL
Anion gap: 8 (ref 5–15)
BUN: 24 mg/dL — ABNORMAL HIGH (ref 8–23)
CO2: 29 mmol/L (ref 22–32)
Calcium: 7.4 mg/dL — ABNORMAL LOW (ref 8.9–10.3)
Chloride: 102 mmol/L (ref 98–111)
Creatinine, Ser: 1.47 mg/dL — ABNORMAL HIGH (ref 0.61–1.24)
GFR, Estimated: 50 mL/min — ABNORMAL LOW (ref >60)
Glucose, Bld: 82 mg/dL (ref 70–99)
Potassium: 3.3 mmol/L — ABNORMAL LOW (ref 3.5–5.1)
Sodium: 139 mmol/L (ref 135–145)

## 2022-09-18 LAB — CBC
HCT: 25.1 % — ABNORMAL LOW (ref 39.0–52.0)
Hemoglobin: 8.2 g/dL — ABNORMAL LOW (ref 13.0–17.0)
MCH: 30.4 pg (ref 26.0–34.0)
MCHC: 32.7 g/dL (ref 30.0–36.0)
MCV: 93 fL (ref 80.0–100.0)
Platelets: 92 10*3/uL — ABNORMAL LOW (ref 150–400)
RBC: 2.7 MIL/uL — ABNORMAL LOW (ref 4.22–5.81)
RDW: 13.8 % (ref 11.5–15.5)
WBC: 5.7 10*3/uL (ref 4.0–10.5)
nRBC: 0 % (ref 0.0–0.2)

## 2022-09-18 LAB — MAGNESIUM: Magnesium: 1.5 mg/dL — ABNORMAL LOW (ref 1.7–2.4)

## 2022-09-18 SURGERY — ESOPHAGOGASTRODUODENOSCOPY (EGD) WITH PROPOFOL
Anesthesia: Monitor Anesthesia Care

## 2022-09-18 MED ORDER — EPHEDRINE SULFATE-NACL 50-0.9 MG/10ML-% IV SOSY
PREFILLED_SYRINGE | INTRAVENOUS | Status: DC | PRN
  Administered 2022-09-18 (×2): 10 mg via INTRAVENOUS
  Administered 2022-09-18: 5 mg via INTRAVENOUS

## 2022-09-18 MED ORDER — PROPOFOL 10 MG/ML IV BOLUS
INTRAVENOUS | Status: DC | PRN
  Administered 2022-09-18 (×2): 25 mg via INTRAVENOUS

## 2022-09-18 MED ORDER — POTASSIUM CHLORIDE 10 MEQ/100ML IV SOLN
10.0000 meq | INTRAVENOUS | Status: AC
  Administered 2022-09-18 (×2): 10 meq via INTRAVENOUS
  Filled 2022-09-18: qty 100

## 2022-09-18 MED ORDER — PROPOFOL 500 MG/50ML IV EMUL
INTRAVENOUS | Status: DC | PRN
  Administered 2022-09-18: 100 ug/kg/min via INTRAVENOUS

## 2022-09-18 MED ORDER — PHENYLEPHRINE 80 MCG/ML (10ML) SYRINGE FOR IV PUSH (FOR BLOOD PRESSURE SUPPORT)
PREFILLED_SYRINGE | INTRAVENOUS | Status: DC | PRN
  Administered 2022-09-18: 240 ug via INTRAVENOUS
  Administered 2022-09-18: 160 ug via INTRAVENOUS
  Administered 2022-09-18: 240 ug via INTRAVENOUS
  Administered 2022-09-18: 320 ug via INTRAVENOUS

## 2022-09-18 MED ORDER — LACTATED RINGERS IV SOLN: INTRAVENOUS | Status: DC | PRN

## 2022-09-18 MED ORDER — POTASSIUM CHLORIDE 10 MEQ/100ML IV SOLN
10.0000 meq | INTRAVENOUS | Status: AC
  Administered 2022-09-18 (×2): 10 meq via INTRAVENOUS
  Filled 2022-09-18 (×3): qty 100

## 2022-09-18 MED ORDER — LIDOCAINE 2% (20 MG/ML) 5 ML SYRINGE
INTRAMUSCULAR | Status: DC | PRN
  Administered 2022-09-18: 60 mg via INTRAVENOUS

## 2022-09-18 MED ORDER — MAGNESIUM SULFATE 2 GM/50ML IV SOLN
2.0000 g | Freq: Once | INTRAVENOUS | Status: AC
  Administered 2022-09-18: 2 g via INTRAVENOUS
  Filled 2022-09-18: qty 50

## 2022-09-18 SURGICAL SUPPLY — 15 items

## 2022-09-18 NOTE — Anesthesia Preprocedure Evaluation (Signed)
Anesthesia Evaluation  Patient identified by MRN, date of birth, ID band Patient awake    Reviewed: Allergy & Precautions, H&P , NPO status , Patient's Chart, lab work & pertinent test results  Airway Mallampati: II   Neck ROM: full    Dental   Pulmonary COPD, Current Smoker   breath sounds clear to auscultation       Cardiovascular hypertension, +CHF   Rhythm:regular Rate:Normal     Neuro/Psych    GI/Hepatic ,GERD  ,,GI bleeding   Endo/Other  Hypothyroidism    Renal/GU Renal InsufficiencyRenal disease     Musculoskeletal  (+) Arthritis ,    Abdominal   Peds  Hematology  (+) Blood dyscrasia, anemia   Anesthesia Other Findings   Reproductive/Obstetrics                             Anesthesia Physical Anesthesia Plan  ASA: 3  Anesthesia Plan: MAC   Post-op Pain Management:    Induction: Intravenous  PONV Risk Score and Plan: 0 and Propofol infusion and Treatment may vary due to age or medical condition  Airway Management Planned: Nasal Cannula  Additional Equipment:   Intra-op Plan:   Post-operative Plan:   Informed Consent: I have reviewed the patients History and Physical, chart, labs and discussed the procedure including the risks, benefits and alternatives for the proposed anesthesia with the patient or authorized representative who has indicated his/her understanding and acceptance.     Dental advisory given  Plan Discussed with: CRNA, Anesthesiologist and Surgeon  Anesthesia Plan Comments:        Anesthesia Quick Evaluation

## 2022-09-18 NOTE — Progress Notes (Signed)
OT Cancellation Note  Patient Details Name: [name] MRN: [id number] DOB: 10-27-1945   Cancelled Treatment:    Reason Eval/Treat Not Completed: Patient at procedure or test/ unavailable (Patient off unit for procedure, will attempt as schedule permits) Alfonse Flavors, OTA Acute Rehabilitation Services  Office [phone]  Dewain Penning 09/18/2022, 2:26 PM

## 2022-09-18 NOTE — Op Note (Addendum)
[facility] Patient Name: [name] Procedure Date : 09/18/2022 MRN: [id number] Attending MD: Napoleon Form , MD, [id number] Date of Birth: 1945/05/16 CSN: [id number] Age: 77 Admit Type: Inpatient Procedure:                Upper GI endoscopy Indications:              Suspected upper gastrointestinal bleeding, Heme                            positive stool Providers:                Napoleon Form, MD, Adolph Pollack, RN,                            Martha Clan, RN, Marja Kays, Technician Referring MD:              Medicines:                Monitored Anesthesia Care Complications:            No immediate complications. Estimated Blood Loss:     Estimated blood loss was minimal. Procedure:                Pre-Anesthesia Assessment:                           - Prior to the procedure, a History and Physical                            was performed, and patient medications and                            allergies were reviewed. The patient's tolerance of                            previous anesthesia was also reviewed. The risks                            and benefits of the procedure and the sedation                            options and risks were discussed with the patient.                            All questions were answered, and informed consent                            was obtained. Prior Anticoagulants: The patient has                            taken no anticoagulant or antiplatelet agents. ASA                            Grade Assessment: III - A patient with severe  systemic disease. After reviewing the risks and                            benefits, the patient was deemed in satisfactory                            condition to undergo the procedure.                           After obtaining informed consent, the endoscope was                            passed under direct vision. Throughout the                             procedure, the patient's blood pressure, pulse, and                            oxygen saturations were monitored continuously. The                            [id number]-[id number] ([id number]) Olympus endoscope was introduced                            through the mouth, and advanced to the second part                            of duodenum. The upper GI endoscopy was                            accomplished without difficulty. The patient                            tolerated the procedure well. Scope In: Scope Out: Findings:      The Z-line was regular and was found 38 cm from the incisors.      The examined esophagus was normal.      Patchy moderate inflammation characterized by congestion (edema),       erosions, erythema and friability was found in the entire examined       stomach. Biopsies were taken with a cold forceps for histology. Biopsies       were taken with a cold forceps for Helicobacter pylori testing.      The cardia and gastric fundus were normal on retroflexion.      The examined duodenum was normal. Impression:               - Z-line regular, 38 cm from the incisors.                           - Normal esophagus.                           - Gastritis. Biopsied.                           - Normal examined  duodenum. Recommendation:           - Patient has a contact number available for                            emergencies. The signs and symptoms of potential                            delayed complications were discussed with the                            patient. Return to normal activities tomorrow.                            Written discharge instructions were provided to the                            patient.                           - Resume previous diet.                           - Continue present medications.                           - Await pathology results.                           - Use Protonix (pantoprazole) 40 mg PO BID.                           -  Follow up in GI office next available appointment                           - No further inpatient GI work up at this point, GI                            will sign off, please call with any questions Procedure Code(s):        --- Professional ---                           [phone], Esophagogastroduodenoscopy, flexible,                            transoral; with biopsy, single or multiple Diagnosis Code(s):        --- Professional ---                           K29.70, Gastritis, unspecified, without bleeding                           R19.5, Other fecal abnormalities CPT copyright 2022 [employer]. All rights reserved. The codes documented in this report are preliminary and upon coder review may  be revised to meet current compliance requirements. Napoleon Form, MD 09/18/2022 2:28:23 PM This report  has been signed electronically. Number of Addenda: 0

## 2022-09-18 NOTE — Interval H&P Note (Signed)
History and Physical Interval Note:  09/18/2022 1:41 PM  [name]  has presented today for surgery, with the diagnosis of anemia, hem positive stool.  The various methods of treatment have been discussed with the patient and family. After consideration of risks, benefits and other options for treatment, the patient has consented to  Procedure(s): ESOPHAGOGASTRODUODENOSCOPY (EGD) WITH PROPOFOL (N/A) as a surgical intervention.  The patient's history has been reviewed, patient examined, no change in status, stable for surgery.  I have reviewed the patient's chart and labs.  Questions were answered to the patient's satisfaction.     Naoko Diperna

## 2022-09-18 NOTE — Anesthesia Procedure Notes (Signed)
Procedure Name: MAC Date/Time: 09/18/2022 1:52 PM  Performed by: Waynard Edwards, CRNAPre-anesthesia Checklist: Patient identified, Emergency Drugs available, Suction available and Patient being monitored Patient Re-evaluated:Patient Re-evaluated prior to induction Oxygen Delivery Method: Nasal cannula Induction Type: IV induction Airway Equipment and Method: Bite block Placement Confirmation: positive ETCO2 Dental Injury: Teeth and Oropharynx as per pre-operative assessment

## 2022-09-18 NOTE — Progress Notes (Signed)
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  Decreased oral intake.  On presentation, he was hypotensive, lab work showed creatinine of 11.4, hemoglobin of 8.  Patient was also given a unit of PRBC at [facility].  Patient admitted for further management.  GI consulted and following, plan for EGD today.  Kidney function significantly improving with IV fluid.  PT/OT OT recommending skilled nursing facility on discharge  Assessment & Plan:  Principal Problem:   ARF (acute renal failure) (HCC) Active Problems:   Essential hypertension   BPH (benign prostatic hyperplasia)   Acquired hypothyroidism   Hypomagnesemia   Hyperkalemia   Acute on chronic anemia   Nausea vomiting and diarrhea   Hyperlipidemia   Chronic diastolic CHF (congestive heart failure) (HCC)   Nausea and vomiting   Normocytic anemia   Anemia, chronic disease   Pressure injury of skin  AKI/hyperkalemia: Previous creatinine was 1.1 as per 07/31/2022.  Presented with creatinine in the range of 11.  This is most likely prerenal AKI because of dehydration from vomiting and diarrhea.  Ultrasound of the kidneys did not show any hydronephrosis but showed bilateral simple renal cyst.  Kidney function has significantly improved with IV fluids. Patient also takes benazepril at home.  Home benazepril, Bactrim on hold  Normocytic anemia: Baseline hemoglobin ranges from 9-12.  Presented with hemoglobin in the range of 8.  No report of hematochezia or melena.  DRE performed in the emergency department showed brown-colored stool.But FOBT came out to be  positive.  He was also given unit of PRBC at [facility].  Currently hemoglobin in the range of 9.   Iron level optimal. Due to history of diarrhea, vomiting , elevated lipase, positive FOBT, we consulted GI,plan for EGD today  Nausea, vomiting, diarrhea/elevated lipase: Ongoing for last 5 weeks.  No fever or leukocytosis.  GI pathogen panel/C diff was sent. Elevated lipase of 600s.  Patient denies any abdominal pain but is nauseous and vomiting today. abdomen is soft, nondistended, nontender, has good bowel sounds.  CT abdomen/pelvis done on 09/13/2022 at [facility] could not find a specific cause for patient's nausea and vomiting with no acute findings, unremarkable pancreas. Repeat CT abdomen/pelvis today without contrast did not show acute findings.  Nausea, vomiting have resolved today.  Diarrhea has also resolved  History of chronic UTI: UA showed plenty of bacteria, leukocytes.  On chronic Bactrim therapy at home.  Urine culture showed Pseudomonas.  Currently on oral Cipro  Hypothyroidism: Continue Synthyroid  BPH: Takes Flomax  Hypertension: Takes benazepril at home.  Currently on hold  Hyperlipidemia: On simvastatin as an outpatient.  Hypomagnesemia: Supplemented with magnesium.  Continue to monitor levels  Chronic diastolic CHF: Last echo as per October 2020 showed EF of 60 to 65%, impaired relaxation.  He was dehydrated on presentation and was given IV fluid  Infrarenal abdominal aortic aneurysm: CT showed  3.6 cm in diameter, stable . Recommend follow-up ultrasound every 2 years   Confusion/debility/disposition: Patient looks confused.  He is alert and awake and oriented to place only.As per stepdaughter he sleeps all the time,not that ambulatory.daughter said this all started after a back surgery  about 9  months ago. He was walking fine before surgery.He missed his appointments.As per the stepdaughter,he is usually alert and oriented at baseline. CT of the head without any  acute findings.  Normal ammonia level.  PT/OT consulted for generalized weakness, recommended SNF on discharge.Vit B12 level more than level,TSH and folate level normal.  Checking MRI of the brain today to rule out any acute findings for the evaluation of his persistent confusion     Pressure Injury 09/14/22 Buttocks Right Stage 2 -  Partial thickness loss of dermis presenting as a shallow open injury with a red, pink wound bed without slough. pink (Active)  09/14/22 2131  Location: Buttocks  Location Orientation: Right  Staging: Stage 2 -  Partial thickness loss of dermis presenting as a shallow open injury with a red, pink wound bed without slough.  Wound Description (Comments): pink  Present on Admission: Yes  Dressing Type Foam - Lift dressing to assess site every shift 09/18/22 0845    DVT prophylaxis:SCDs Start: 09/14/22 1934     Code Status: Full Code  Family Communication: Called and discussed with daughter on 6/5  Patient status:Inpatient  Patient is from :Home  Anticipated discharge to: SNF  Estimated DC date:2-3 days   Consultants: GI  Procedures:None  Antimicrobials:  Anti-infectives (From admission, onward)    Start     Dose/Rate Route Frequency Ordered Stop   09/17/22 1300  ciprofloxacin (CIPRO) tablet 500 mg        500 mg Oral 2 times daily 09/17/22 1219     09/16/22 1600  ceFEPIme (MAXIPIME) 2 g in sodium chloride 0.9 % 100 mL IVPB  Status:  Discontinued        2 g 200 mL/hr over 30 Minutes Intravenous Every 12 hours 09/16/22 1509 09/17/22 1219   09/15/22 0900  cefTRIAXone (ROCEPHIN) 1 g in sodium chloride 0.9 % 100 mL IVPB  Status:  Discontinued        1 g 200 mL/hr over 30 Minutes Intravenous Daily 09/15/22 0821 09/16/22 1508       Subjective: Patient seen and examined at the bedside today.  Hemodynamically stable.  Lying in bed.   Complains of weakness.  No nausea or vomiting or abdominal pain today.Oriented to place but not  time  Objective: Vitals:   09/18/22 0406 09/18/22 0535 09/18/22 0536 09/18/22 0821  BP: 97/64 98/61 (!) 101/48 101/65  Pulse: 95   90  Resp: 17   18  Temp: 98.1 F (36.7 C)   97.6 F (36.4 C)  TempSrc: Oral   Oral  SpO2: 96%   94%  Weight: 104.5 kg     Height:        Intake/Output Summary (Last 24 hours) at 09/18/2022 1154 Last data filed at 09/17/2022 2300 Gross per 24 hour  Intake 293.29 ml  Output 1700 ml  Net -1406.71 ml   Filed Weights   09/16/22 0310 09/17/22 0436 09/18/22 0406  Weight: 98.5 kg 98.5 kg 104.5 kg    Examination: General exam: Overall comfortable, not in distress,weak and deconditioned HEENT: PERRL Respiratory system:  no wheezes or crackles  Cardiovascular system: S1 & S2 heard, RRR.  Gastrointestinal system: Abdomen is nondistended, soft and nontender. Central nervous system: Alert and oriented to place only  Extremities: No edema, no clubbing ,no cyanosis Skin: No rashes, no ulcers,no icterus   GU: foley   Data Reviewed: I have personally reviewed following labs and imaging studies  CBC: Recent Labs  Lab 09/14/22  1740 09/14/22 1751 09/15/22 0105 09/15/22 0129 09/16/22 0138 09/17/22 0231 09/18/22 0202  WBC 4.7  --   --  5.0 5.5 6.3 5.7  NEUTROABS 3.9  --   --  4.1  --   --   --   HGB 8.8*   < > 8.9* 9.0* 9.0* 8.5* 8.2*  HCT 27.4*   < > 28.2* 28.0* 27.0* 25.9* 25.1*  MCV 97.2  --   --  94.0 93.4 92.5 93.0  PLT 92*  --   --  97* 113* 96* 92*   < > = values in this interval not displayed.   Basic Metabolic Panel: Recent Labs  Lab 09/14/22 1740 09/14/22 1751 09/15/22 0105 09/15/22 0129 09/16/22 0138 09/17/22 0231 09/18/22 0202  NA 140   < > 140 139 140 140 139  K 5.3*   < > 5.3* 5.0 4.2 3.7 3.3*  CL 112*   < > 114* 113* 109 100 102  CO2 13*  --  16* 16* 19* 26 29  GLUCOSE 96   < > 93 95 80 87 82  BUN 116*   < > 98* 96* 60* 41* 24*  CREATININE 6.45*   < > 5.19* 5.06* 2.44* 1.80* 1.47*  CALCIUM 8.1*  --  8.1* 8.1* 7.7* 7.4*  7.4*  MG 1.1*  --   --  1.5* 1.3* 1.6* 1.5*  PHOS 4.8*  --   --  4.2  --   --   --    < > = values in this interval not displayed.     Recent Results (from the past 240 hour(s))  Blood culture (routine x 2)     Status: None (Preliminary result)   Collection Time: 09/14/22  5:40 PM   Specimen: BLOOD  Result Value Ref Range Status   Specimen Description BLOOD LEFT ARM  Final   Special Requests   Final    BOTTLES DRAWN AEROBIC AND ANAEROBIC Blood Culture results may not be optimal due to an excessive volume of blood received in culture bottles   Culture   Final    NO GROWTH 4 DAYS Performed at [facility], [address] [place], [place] [address]    Report Status PENDING  Incomplete  Blood culture (routine x 2)     Status: None (Preliminary result)   Collection Time: 09/14/22  7:34 PM   Specimen: BLOOD  Result Value Ref Range Status   Specimen Description BLOOD LEFT ARM  Final   Special Requests   Final    BOTTLES DRAWN AEROBIC AND ANAEROBIC Blood Culture adequate volume   Culture   Final    NO GROWTH 4 DAYS Performed at [facility], [address] [place], [place] [address]    Report Status PENDING  Incomplete  Urine Culture (for pregnant, neutropenic or urologic patients or patients with an indwelling urinary catheter)     Status: Abnormal   Collection Time: 09/15/22 12:19 PM   Specimen: Urine, Clean Catch  Result Value Ref Range Status   Specimen Description URINE, CLEAN CATCH  Final   Special Requests   Final    NONE Performed at [facility], [address], [place], [place] [address]    Culture 40,000 COLONIES/mL PSEUDOMONAS AERUGINOSA (A)  Final   Report Status 09/17/2022 FINAL  Final   Organism ID, Bacteria PSEUDOMONAS AERUGINOSA (A)  Final      Susceptibility   Pseudomonas aeruginosa - MIC*    CEFTAZIDIME 4  SENSITIVE Sensitive     CIPROFLOXACIN <=0.25 SENSITIVE Sensitive     GENTAMICIN <=1 SENSITIVE Sensitive     IMIPENEM <=0.25 SENSITIVE  Sensitive     PIP/TAZO 8 SENSITIVE Sensitive     CEFEPIME 2 SENSITIVE Sensitive     * 40,000 COLONIES/mL PSEUDOMONAS AERUGINOSA     Radiology Studies: CT ABDOMEN PELVIS WO CONTRAST  Result Date: 09/16/2022 CLINICAL DATA:  Nonbilious vomiting. EXAM: CT ABDOMEN AND PELVIS WITHOUT CONTRAST TECHNIQUE: Multidetector CT imaging of the abdomen and pelvis was performed following the standard protocol without IV contrast. RADIATION DOSE REDUCTION: This exam was performed according to the departmental dose-optimization program which includes automated exposure control, adjustment of the mA and/or kV according to patient size and/or use of iterative reconstruction technique. COMPARISON:  CT angiogram chest abdomen and pelvis 08/01/2022 FINDINGS: Lower chest: No acute abnormality. Hepatobiliary: Gallbladder is distended and small gallstones are present. There is no surrounding inflammation. Bile ducts and liver appear within normal limits. Pancreas: Unremarkable. No pancreatic ductal dilatation or surrounding inflammatory changes. Spleen: Normal in size without focal abnormality. Adrenals/Urinary Tract: There is a punctate nonobstructing left renal calculus. Bilateral renal cysts are present measuring up to 4.9 cm. There is no hydronephrosis. The adrenal glands are within normal limits. There is a Foley catheter in the bladder. Air in the bladder is likely related to Foley catheter. Stomach/Bowel: Stomach is within normal limits. Appendix appears normal. No evidence of bowel wall thickening, distention, or inflammatory changes. There is sigmoid colon diverticulosis. Vascular/Lymphatic: Aortic atherosclerosis. 3.5 cm infrarenal abdominal aortic aneurysm is unchanged. No enlarged abdominal or pelvic lymph nodes. Reproductive: Prostate is unremarkable. Other: No abdominal wall hernia or abnormality. No abdominopelvic ascites. Musculoskeletal: L1-L5 posterior fusion hardware is present. There are degenerative changes of  both hips. The bones are osteopenic. IMPRESSION: 1. Cholelithiasis. 2. Nonobstructing left renal calculus. 3. 3.5 cm infrarenal abdominal aortic aneurysm. Recommend follow-up every 2 years. 4. Bosniak I benign renal cyst measuring 4.9 cm. No follow-up imaging is recommended. JACR 2018 Feb; 264-273, Management of the Incidental Renal Mass on CT, [employer] 2021; 814-848, Bosniak Classification of Cystic Renal Masses, Version 2019. Aortic Atherosclerosis (ICD10-I70.0). Electronically Signed   By: Darliss Cheney M.D.   On: 09/16/2022 17:49    Scheduled Meds:  Chlorhexidine Gluconate Cloth  6 each Topical Daily   ciprofloxacin  500 mg Oral BID   DULoxetine  60 mg Oral Daily   gabapentin  300 mg Oral TID   levothyroxine  150 mcg Oral Q0600   pantoprazole (PROTONIX) IV  40 mg Intravenous Q24H   simvastatin  20 mg Oral QHS   tamsulosin  0.4 mg Oral BID   Continuous Infusions:  potassium chloride 10 mEq (09/18/22 1053)     LOS: 4 days   Burnadette Pop, MD [facility] P6/08/2022, 11:54 AM

## 2022-09-18 NOTE — Progress Notes (Signed)
SLP Cancellation Note  Patient Details Name: [name] MRN: [id number] DOB: 1945/06/09   Cancelled treatment:       Reason Eval/Treat Not Completed: Other (comment). Patient NPO for EGD. SLP will f/u when patient back on PO's.   Angela Nevin, MA, CCC-SLP Speech Therapy

## 2022-09-18 NOTE — Progress Notes (Signed)
Patient spills urinal in bed and refusing to have a primo fit. Patient instructed to call with he spills his urinal so we can clean him up.

## 2022-09-18 NOTE — Plan of Care (Signed)

## 2022-09-18 NOTE — Transfer of Care (Signed)
Immediate Anesthesia Transfer of Care Note  Patient: [name]  Procedure(s) Performed: ESOPHAGOGASTRODUODENOSCOPY (EGD) WITH PROPOFOL BIOPSY  Patient Location: Endoscopy Unit  Anesthesia Type:MAC  Level of Consciousness: drowsy  Airway & Oxygen Therapy: Patient Spontanous Breathing and Patient connected to nasal cannula oxygen  Post-op Assessment: Report given to RN and Post -op Vital signs reviewed and stable  Post vital signs: Reviewed and stable  Last Vitals:  Vitals Value Taken Time  BP    Temp    Pulse 65 09/18/22 1417  Resp 17 09/18/22 1417  SpO2 98 % 09/18/22 1417  Vitals shown include unvalidated device data.  Last Pain:  Vitals:   09/18/22 1203  TempSrc: Temporal  PainSc: 0-No pain      Patients Stated Pain Goal: 0 (09/17/22 1029)  Complications: No notable events documented.

## 2022-09-19 DIAGNOSIS — N179 Acute kidney failure, unspecified: Secondary | ICD-10-CM | POA: Diagnosis not present

## 2022-09-19 LAB — BASIC METABOLIC PANEL
Anion gap: 6 (ref 5–15)
BUN: 23 mg/dL (ref 8–23)
CO2: 30 mmol/L (ref 22–32)
Calcium: 7.7 mg/dL — ABNORMAL LOW (ref 8.9–10.3)
Chloride: 102 mmol/L (ref 98–111)
Creatinine, Ser: 1.41 mg/dL — ABNORMAL HIGH (ref 0.61–1.24)
GFR, Estimated: 52 mL/min — ABNORMAL LOW (ref >60)
Glucose, Bld: 105 mg/dL — ABNORMAL HIGH (ref 70–99)
Potassium: 3.7 mmol/L (ref 3.5–5.1)
Sodium: 138 mmol/L (ref 135–145)

## 2022-09-19 LAB — CBC
HCT: 26.3 % — ABNORMAL LOW (ref 39.0–52.0)
Hemoglobin: 8.2 g/dL — ABNORMAL LOW (ref 13.0–17.0)
MCH: 29.6 pg (ref 26.0–34.0)
MCHC: 31.2 g/dL (ref 30.0–36.0)
MCV: 94.9 fL (ref 80.0–100.0)
Platelets: 83 10*3/uL — ABNORMAL LOW (ref 150–400)
RBC: 2.77 MIL/uL — ABNORMAL LOW (ref 4.22–5.81)
RDW: 13.8 % (ref 11.5–15.5)
WBC: 4.9 10*3/uL (ref 4.0–10.5)
nRBC: 0 % (ref 0.0–0.2)

## 2022-09-19 LAB — CULTURE, BLOOD (ROUTINE X 2): Special Requests: ADEQUATE

## 2022-09-19 LAB — MAGNESIUM: Magnesium: 1.5 mg/dL — ABNORMAL LOW (ref 1.7–2.4)

## 2022-09-19 LAB — SURGICAL PATHOLOGY

## 2022-09-19 MED ORDER — MAGNESIUM SULFATE 4 GM/100ML IV SOLN
4.0000 g | Freq: Once | INTRAVENOUS | Status: AC
  Administered 2022-09-19: 4 g via INTRAVENOUS
  Filled 2022-09-19: qty 100

## 2022-09-19 MED ORDER — PANTOPRAZOLE SODIUM 40 MG PO TBEC
40.0000 mg | DELAYED_RELEASE_TABLET | Freq: Two times a day (BID) | ORAL | Status: DC
  Administered 2022-09-19 – 2022-09-20 (×3): 40 mg via ORAL
  Filled 2022-09-19 (×3): qty 1

## 2022-09-19 NOTE — Progress Notes (Signed)
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  Decreased oral intake.  On presentation, he was hypotensive, lab work showed creatinine of 11.4, hemoglobin of 8.  Patient was also given a unit of PRBC at [facility].  Patient admitted for further management.  GI consulted and following, s/p EGD.  Kidney function significantly improved with IV fluid.  PT/OT recommending skilled nursing facility on discharge.  Medically stable for discharge to SNF whenever possible  Assessment & Plan:  Principal Problem:   ARF (acute renal failure) (HCC) Active Problems:   Essential hypertension   BPH (benign prostatic hyperplasia)   Acquired hypothyroidism   Hypomagnesemia   Hyperkalemia   Acute on chronic anemia   Nausea vomiting and diarrhea   Hyperlipidemia   Chronic diastolic CHF (congestive heart failure) (HCC)   Nausea and vomiting   Normocytic anemia   Anemia of chronic disease   Pressure injury of skin   Heme positive stool   Gastritis and gastroduodenitis  AKI/hyperkalemia: Previous creatinine was 1.1 as per 07/31/2022.  Presented with creatinine in the range of 11.  This is most likely prerenal AKI because of dehydration from vomiting and diarrhea.  Ultrasound of the kidneys did not show any hydronephrosis but showed bilateral simple renal cyst.  Kidney function has significantly improved with IV fluids.Patient also takes benazepril at home.  Home benazepril, Bactrim on hold  Normocytic anemia: Baseline hemoglobin ranges from 9-12.  Presented with hemoglobin in the range of 8.  No report of hematochezia or melena.   DRE performed in the emergency department showed brown-colored stool.But FOBT came out to be positive.  He was also given a unit of PRBC at [facility].  Currently hemoglobin in the range of 8-9.   Iron level optimal. Due to history of diarrhea, vomiting , elevated lipase, positive FOBT, we consulted GI,s/p EGD on 6/5 which shows gastritis but did not show any ulcer or source of bleeding.  GI recommended to continue Protonix twice daily  Nausea, vomiting, diarrhea/elevated lipase: Ongoing for last 5 weeks.  No fever or leukocytosis.  GI pathogen panel/C diff was sent. Elevated lipase of 600s. CT abdomen/pelvis today without contrast did not show acute findings.  Nausea, vomiting have resolved today.  Diarrhea has also resolved  History of chronic UTI: UA showed plenty of bacteria, leukocytes.  On chronic Bactrim therapy at home.  Urine culture showed Pseudomonas.  Currently on oral Cipro  Hypothyroidism: Continue Synthyroid  BPH: Takes Flomax  Hypertension: Takes benazepril at home.  Currently on hold.BP soft/stable  Hyperlipidemia: On simvastatin as an outpatient.  Hypomagnesemia: Persistent.Supplemented aggressively with magnesium.  Continue to monitor levels.  Continue high-dose supplementation on discharge by mouth  Chronic diastolic CHF: Last echo as per October 2020 showed EF of 60 to 65%, impaired relaxation.  He was dehydrated on presentation and was given IV fluid  Infrarenal abdominal aortic aneurysm: CT showed  3.6 cm in diameter, stable . Recommend follow-up ultrasound every 2 years   Confusion/debility/disposition: Patient looks confused.  He is alert and awake and oriented to place only.As per stepdaughter he sleeps all the time,not that ambulatory.daughter said this all started after a back surgery  about 9  months ago. He was walking fine before surgery.He missed his appointments.As per the stepdaughter,he is usually alert and oriented at baseline. CT of the head without any  acute findings.  Normal ammonia level.  PT/OT consulted for generalized weakness, recommended SNF on discharge.Vit B12 level more than level,TSH and folate level normal.  MRI of the brain did not show any acute findings but showed chronic small-vessel ischemic,generalized brain atrophy with subjective frontal and temporal lobe predominance. 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    Pressure Injury 09/14/22 Buttocks Right Stage 2 -  Partial thickness loss of dermis presenting as a shallow open injury with a red, pink wound bed without slough. pink (Active)  09/14/22 2131  Location: Buttocks  Location Orientation: Right  Staging: Stage 2 -  Partial thickness loss of dermis presenting as a shallow open injury with a red, pink wound bed without slough.  Wound Description (Comments): pink  Present on Admission: Yes  Dressing Type Foam - Lift dressing to assess site every shift 09/19/22 0815    DVT prophylaxis:SCDs Start: 09/14/22 1934     Code Status: Full Code  Family Communication: Called and discussed with daughter on 6/5  Patient status:Inpatient  Patient is from :Home  Anticipated discharge to: SNF  Estimated DC date:whenever possible   Consultants: GI  Procedures:EGD  Antimicrobials:  Anti-infectives (From admission, onward)    Start     Dose/Rate Route Frequency Ordered Stop   09/17/22 1300  ciprofloxacin (CIPRO) tablet 500 mg        500 mg Oral 2 times daily 09/17/22 1219     09/16/22 1600  ceFEPIme (MAXIPIME) 2 g in sodium chloride 0.9 % 100 mL IVPB  Status:  Discontinued        2 g 200 mL/hr over 30 Minutes Intravenous Every 12 hours 09/16/22 1509 09/17/22 1219   09/15/22 0900  cefTRIAXone (ROCEPHIN) 1 g in sodium chloride 0.9 % 100 mL IVPB  Status:  Discontinued        1 g 200 mL/hr over 30 Minutes Intravenous Daily 09/15/22 0821 09/16/22 1508       Subjective: Patient seen and examined at bedside today.  Hemodynamically stable.  Comfortable.  Lying in bed.  Alert  and awake and oriented to place but confused to time.  Not agitated.  Obeys commands.  Denies any complaints  Objective: Vitals:   09/18/22 2332 09/19/22 0345 09/19/22 0713 09/19/22 1014  BP: (!) 99/58 112/68 (!) 98/59 113/67  Pulse: (!) 41 79 96   Resp: 19 18 20 15   Temp: 97.7 F (36.5 C) 97.9 F (36.6 C) 98 F (36.7 C) 98.1 F (36.7 C)  TempSrc: Oral Oral Oral Oral  SpO2: 98% 97% 96%   Weight:      Height:        Intake/Output Summary (Last 24 hours) at 09/19/2022 1120 Last data filed at 09/19/2022 0717 Gross per 24 hour  Intake 1350.93 ml  Output 1250 ml  Net 100.93 ml   Filed Weights   09/16/22 0310 09/17/22 0436 09/18/22 0406  Weight: 98.5 kg 98.5 kg 104.5 kg    Examination: General exam: Overall comfortable, not in distress,weak and deconditioned HEENT: PERRL Respiratory system:  no wheezes or crackles  Cardiovascular system: S1 & S2 heard, RRR.  Gastrointestinal system: Abdomen is nondistended, soft and nontender. Central nervous system: Alert and awake but oriented to place only Extremities: No edema, no clubbing ,no cyanosis Skin: No rashes, no ulcers,no icterus  Data Reviewed: I have personally reviewed following labs and imaging studies  CBC: Recent Labs  Lab 09/14/22 1740 09/14/22 1751 09/15/22 0129 09/16/22 0138 09/17/22 0231 09/18/22 0202 09/19/22 0229  WBC 4.7  --  5.0 5.5 6.3 5.7 4.9  NEUTROABS 3.9  --  4.1  --   --   --   --   HGB 8.8*   < > 9.0* 9.0* 8.5* 8.2* 8.2*  HCT 27.4*   < > 28.0* 27.0* 25.9* 25.1* 26.3*  MCV 97.2  --  94.0 93.4 92.5 93.0 94.9  PLT 92*  --  97* 113* 96* 92* 83*   < > = values in this interval not displayed.   Basic Metabolic Panel: Recent Labs  Lab 09/14/22 1740 09/14/22 1751 09/15/22 0129 09/16/22 0138 09/17/22 0231 09/18/22 0202 09/19/22 0229  NA 140   < > 139 140 140 139 138  K 5.3*   < > 5.0 4.2 3.7 3.3* 3.7  CL 112*   < > 113* 109 100 102 102  CO2 13*   < > 16* 19* 26 29 30   GLUCOSE 96   < > 95  80 87 82 105*  BUN 116*   < > 96* 60* 41* 24* 23  CREATININE 6.45*   < > 5.06* 2.44* 1.80* 1.47* 1.41*  CALCIUM 8.1*   < > 8.1* 7.7* 7.4* 7.4* 7.7*  MG 1.1*  --  1.5* 1.3* 1.6* 1.5* 1.5*  PHOS 4.8*  --  4.2  --   --   --   --    < > = values in this interval not displayed.     Recent Results (from the past 240 hour(s))  Blood culture (routine x 2)     Status: None   Collection Time: 09/14/22  5:40 PM   Specimen: BLOOD  Result Value Ref Range Status   Specimen Description BLOOD LEFT ARM  Final   Special Requests   Final    BOTTLES DRAWN AEROBIC AND ANAEROBIC Blood Culture results may not be optimal due to an excessive volume of blood received in culture bottles   Culture   Final    NO GROWTH 5 DAYS Performed at [facility], [address] [place], [place] [address]    Report Status 09/19/2022 FINAL  Final  Blood culture (routine x 2)     Status: None   Collection Time: 09/14/22  7:34 PM   Specimen: BLOOD  Result Value Ref Range Status   Specimen Description BLOOD LEFT ARM  Final   Special Requests   Final    BOTTLES DRAWN AEROBIC AND ANAEROBIC Blood Culture adequate volume   Culture   Final    NO GROWTH 5 DAYS Performed at [facility], [address] [place], [place] [address]    Report Status 09/19/2022 FINAL  Final  Urine Culture (for pregnant, neutropenic or urologic patients or patients with an indwelling urinary catheter)     Status: Abnormal   Collection Time: 09/15/22 12:19 PM   Specimen: Urine, Clean Catch  Result Value Ref Range Status   Specimen Description URINE, CLEAN CATCH  Final   Special Requests   Final    NONE Performed at [facility], [address] [place], [place] [address]    Culture 40,000 COLONIES/mL PSEUDOMONAS AERUGINOSA (A)  Final   Report Status 09/17/2022 FINAL  Final   Organism ID, Bacteria PSEUDOMONAS AERUGINOSA (A)  Final      Susceptibility  Pseudomonas aeruginosa - MIC*    CEFTAZIDIME 4 SENSITIVE Sensitive      CIPROFLOXACIN <=0.25 SENSITIVE Sensitive     GENTAMICIN <=1 SENSITIVE Sensitive     IMIPENEM <=0.25 SENSITIVE Sensitive     PIP/TAZO 8 SENSITIVE Sensitive     CEFEPIME 2 SENSITIVE Sensitive     * 40,000 COLONIES/mL PSEUDOMONAS AERUGINOSA     Radiology Studies: MR BRAIN WO CONTRAST  Result Date: 09/18/2022 CLINICAL DATA:  Confusion.  Encephalopathy.  Somnolent. EXAM: MRI HEAD WITHOUT CONTRAST TECHNIQUE: Multiplanar, multiecho pulse sequences of the brain and surrounding structures were obtained without intravenous contrast. COMPARISON:  Head CT 09/15/2022 FINDINGS: Brain: Diffusion imaging does not show any acute or subacute infarction or other cause of restricted diffusion. There chronic small-vessel ischemic changes of the pons, more on the right than the left. No focal cerebellar insult. Mild chronic small-vessel ischemic change of the cerebral hemispheric white matter as seen previously. Generalized brain atrophy with subjective frontal and temporal lobe predominance. No mass, hemorrhage, hydrocephalus or extra-axial collection. Vascular: Major vessels at the base of the brain show flow. Skull and upper cervical spine: Negative Sinuses/Orbits: Polyps or cysts in the right maxillary sinus and posterior nasal passages. 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 Non emergent ENT consultation recommended. Orbits negative. Other: Small amount of fluid in the mastoid air cells on the right. IMPRESSION: 1. No acute intracranial finding. Chronic small-vessel ischemic changes of the pons and cerebral hemispheric white matter. Generalized brain atrophy with subjective frontal and temporal lobe predominance. 2. Polyps or cysts in the right maxillary sinus and posterior nasal passages. Small amount of fluid in the mastoid air cells on the right. 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 Non  emergent ENT consultation recommended. Electronically Signed   By: Paulina Fusi M.D.   On: 09/18/2022 13:35    Scheduled Meds:  Chlorhexidine Gluconate Cloth  6 each Topical Daily   ciprofloxacin  500 mg Oral BID   DULoxetine  60 mg Oral Daily   gabapentin  300 mg Oral TID   levothyroxine  150 mcg Oral Q0600   pantoprazole  40 mg Oral BID   simvastatin  20 mg Oral QHS   tamsulosin  0.4 mg Oral BID   Continuous Infusions:  magnesium sulfate bolus IVPB 4 g (09/19/22 0955)     LOS: 5 days   Burnadette Pop, MD [facility] P6/09/2022, 11:20 AM

## 2022-09-19 NOTE — Progress Notes (Signed)
Speech Language Pathology Treatment: Dysphagia  Patient Details Name: [name] MRN: [id number] DOB: [birth year] Today's Date: 09/19/2022 Time: 4098-1191 SLP Time Calculation (min) (ACUTE ONLY): 25 min  Assessment / Plan / Recommendation Clinical Impression  Pt seen for f/u dysphagia tx with good oral manipulation/clearance, timely swallow and no overt s/sx of aspiration and/or pharyngeal clearance issues noted throughout snack with regular/thin liquids.  Pt given esophageal precautions and swallowing precautions reviewed including slow rate, small bites/sips and recommended remaining upright after meals to A with esophageal concerns.  EGD results on 09/18/22 indicated "normal esophagus, gastritis" with pt being placed on PPI per chart review.  Pt would benefit from continued diet of Dysphagia 3(mechanical soft)/thin liquids with swallowing/esophageal precautions in place during meals/snacks.  ST will s/o in acute setting d/t pt tolerating diet with education completed for precautions with meals.   HPI HPI: Patient is a 77 y.o. 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 He was transferred to [facility] from [facility] for evaluation of acute renal failure. As per report from [facility], he was having nausea, vomiting, diarrhea for past 5 weeks as well as decreased oral intake. CT abdomen/pelvis done on 09/13/2022 at [facility] could not find a specific cause for patient's nausea and vomiting with no acute findings, unremarkable pancreas. CT head showed No acute intracranial abnormality or significant interval change, stable atrophy and white matter disease; likely reflects the sequela of chronic microvascular ischemia; ST f/u for dysphagia tx/diet advancement.  EGD results indicated normal esophagus with gastritis and protonix recommended.      SLP Plan  Discharge SLP treatment due to goals achieved      Recommendations for follow up  therapy are one component of a multi-disciplinary discharge planning process, led by the attending physician.  Recommendations may be updated based on patient status, additional functional criteria and insurance authorization.    Recommendations  Diet recommendations: Dysphagia 3 (mechanical soft);Thin liquid Liquids provided via: Cup;Straw Medication Administration: Whole meds with liquid Supervision: Patient able to self feed;Intermittent supervision to cue for compensatory strategies Compensations: Slow rate;Small sips/bites;Multiple dry swallows after each bite/sip;Follow solids with liquid Postural Changes and/or Swallow Maneuvers: Seated upright 90 degrees                  Oral care BID   Intermittent Supervision/Assistance Dysphagia, unspecified (R13.10)     Discharge SLP treatment due to (comment)     Pat Jaicion Laurie,M.S., CCC-SLP  09/19/2022, 10:46 AM

## 2022-09-19 NOTE — TOC Initial Note (Addendum)
Transition of Care [facility]) - Initial/Assessment Note    Patient Details  Name: [name] MRN: [id number] Date of Birth: 1945-11-18  [facility] CM/SW Contact:    Eduard Roux, LCSW Phone Number: 09/19/2022, 9:38 AM  Clinical Narrative:                  TOC spoke with patient's spouse (noted patient having some confusion). CSW introduced self and explained role. She discussed therapy recommendation for short term rehab at [facility]. She states recently she has not been able to mobilize the patient and care for him in the home. She agrees with recommendations for SNF placement. She reported patient's preferred SNF is [facility]. CSW explained the SNF process. All questions answered.   TOC will provide bed offers once available. TOC will continue to follow and assist with discharge planning.  Antony Blackbird, MSW, LCSW Clinical Social Worker    Expected Discharge Plan: Skilled Nursing Facility Barriers to Discharge: English as a second language teacher, Continued Medical Work up, SNF Pending bed offer   Patient Goals and CMS Choice            Expected Discharge Plan and Services In-house Referral: Clinical Social Work     Living arrangements for the past 2 months: Single Family Home                                      Prior Living Arrangements/Services Living arrangements for the past 2 months: Single Family Home Lives with:: Self, Spouse                   Activities of Daily Living      Permission Sought/Granted                  Emotional Assessment       Orientation: : Oriented to Self, Oriented to Place, Oriented to  Time, Oriented to Situation Alcohol / Substance Use: Not Applicable Psych Involvement: No (comment)  Admission diagnosis:  ARF (acute renal failure) (HCC) [N17.9] Patient Active Problem List   Diagnosis Date Noted   Heme positive stool 09/18/2022   Gastritis and gastroduodenitis 09/18/2022   Anemia of chronic  disease 09/17/2022   Pressure injury of skin 09/17/2022   Nausea and vomiting 09/16/2022   Normocytic anemia 09/16/2022   ARF (acute renal failure) (HCC) 09/14/2022   Hypomagnesemia 09/14/2022   Hyperkalemia 09/14/2022   Acute on chronic anemia 09/14/2022   Nausea vomiting and diarrhea 09/14/2022   Hyperlipidemia 09/14/2022   Chronic diastolic CHF (congestive heart failure) (HCC) 09/14/2022   Bacterial gastroenteritis 07/10/2022   UTI (urinary tract infection) 07/10/2022   Lower extremity weakness 07/10/2022   Idiopathic peripheral neuropathy 06/06/2022   Hard of hearing 08/14/2021   Acquired hypothyroidism 07/18/2021   Pseudomonas UTI infection 07/17/2021   Status post laminectomy 06/29/2021   Lumbar stenosis with neurogenic claudication 06/29/2021   Incomplete bladder emptying 05/09/2021   Myelopathy concurrent with and due to spinal stenosis of thoracic region [facility]) 01/08/2021   Acute cystitis without hematuria 08/18/2020   Nocturia 08/18/2020   Kidney stones 07/04/2020   BPH (benign prostatic hyperplasia) 07/04/2020   Chest pain, rule out acute myocardial infarction 01/31/2019   COPD (chronic obstructive pulmonary disease) (HCC) 01/31/2019   Acute respiratory failure with hypoxia (HCC) 01/31/2019   Essential hypertension 01/31/2019   PCP:  Gilmore Laroche, FNP Pharmacy:   [facility] -  [place], [place] - [address] [place] [address] Phone: [phone] Fax: [phone]     Social Determinants of Health (SDOH) Social History: SDOH Screenings   Food Insecurity: No Food Insecurity (09/18/2021)  Housing: Low Risk  (09/18/2021)  Transportation Needs: No Transportation Needs (09/18/2021)  Alcohol Screen: Low Risk  (09/18/2021)  Depression (PHQ2-9): Low Risk  (07/09/2022)  Recent Concern: Depression (PHQ2-9) - Medium Risk (06/06/2022)  Financial Resource Strain: Low Risk  (09/18/2021)  Physical Activity: Insufficiently Active (09/18/2021)  Social  Connections: Moderately Integrated (09/18/2021)  Stress: No Stress Concern Present (09/18/2021)  Tobacco Use: High Risk (09/18/2022)   SDOH Interventions:     Readmission Risk Interventions     No data to display

## 2022-09-19 NOTE — [place] FL2 (Signed)
[place] MEDICAID FL2 LEVEL OF CARE FORM     IDENTIFICATION  Patient Name: [name] Birthdate: November 22, 1945 Sex: male Admission Date (Current Location): 09/14/2022  [facility] and [place] Number:  [employer] and Address:  [facility] [place], [address], [place], [place] [address]      Provider Number: [id number]  Attending Physician Name and Address:  Burnadette Pop, MD  Relative Name and Phone Number:       Current Level of Care: Hospital Recommended Level of Care: Skilled Nursing Facility Prior Approval Number:    Date Approved/Denied:   PASRR Number: [id number]  Discharge Plan: SNF    Current Diagnoses: Patient Active Problem List   Diagnosis Date Noted   Heme positive stool 09/18/2022   Gastritis and gastroduodenitis 09/18/2022   Anemia of chronic disease 09/17/2022   Pressure injury of skin 09/17/2022   Nausea and vomiting 09/16/2022   Normocytic anemia 09/16/2022   ARF (acute renal failure) (HCC) 09/14/2022   Hypomagnesemia 09/14/2022   Hyperkalemia 09/14/2022   Acute on chronic anemia 09/14/2022   Nausea vomiting and diarrhea 09/14/2022   Hyperlipidemia 09/14/2022   Chronic diastolic CHF (congestive heart failure) (HCC) 09/14/2022   Bacterial gastroenteritis 07/10/2022   UTI (urinary tract infection) 07/10/2022   Lower extremity weakness 07/10/2022   Idiopathic peripheral neuropathy 06/06/2022   Hard of hearing 08/14/2021   Acquired hypothyroidism 07/18/2021   Pseudomonas UTI infection 07/17/2021   Status post laminectomy 06/29/2021   Lumbar stenosis with neurogenic claudication 06/29/2021   Incomplete bladder emptying 05/09/2021   Myelopathy concurrent with and due to spinal stenosis of thoracic region [facility]) 01/08/2021   Acute cystitis without hematuria 08/18/2020   Nocturia 08/18/2020   Kidney stones 07/04/2020   BPH (benign prostatic hyperplasia) 07/04/2020   Chest pain, rule out acute myocardial infarction  01/31/2019   COPD (chronic obstructive pulmonary disease) (HCC) 01/31/2019   Acute respiratory failure with hypoxia (HCC) 01/31/2019   Essential hypertension 01/31/2019    Orientation RESPIRATION BLADDER Height & Weight     Self, Time, Situation, Place  Normal External catheter, Incontinent Weight:  (bed not working) Height:  6\' 7"  (200.7 cm)  BEHAVIORAL SYMPTOMS/MOOD NEUROLOGICAL BOWEL NUTRITION STATUS      Continent Diet (please see discharge  summary)  AMBULATORY STATUS COMMUNICATION OF NEEDS Skin   Extensive Assist    (pressure injury Buttocks Stage II)                       Personal Care Assistance Level of Assistance  Bathing, Feeding, Dressing Bathing Assistance: Maximum assistance Feeding assistance: Independent Dressing Assistance: Limited assistance     Functional Limitations Info  Sight, Hearing, Speech Sight Info: Impaired (wears glasses) Hearing Info: Impaired Speech Info: Adequate    SPECIAL CARE FACTORS FREQUENCY  PT (By licensed PT), OT (By licensed OT)     PT Frequency: 5x per week OT Frequency: 5x per week            Contractures Contractures Info: Not present    Additional Factors Info  Code Status, Allergies Code Status Info: FULL Allergies Info: sulfa antibiotics           Current Medications (09/19/2022):  This is the current hospital active medication list Current Facility-Administered Medications  Medication Dose Route Frequency Provider Last Rate Last Admin   acetaminophen (TYLENOL) tablet 650 mg  650 mg Oral Q6H PRN Howerter, Justin B, DO   650 mg at 09/17/22 1030  Or   acetaminophen (TYLENOL) suppository 650 mg  650 mg Rectal Q6H PRN Howerter, Justin B, DO       alum & mag hydroxide-simeth (MAALOX/MYLANTA) 200-200-20 MG/5ML suspension 15 mL  15 mL Oral Q4H PRN Burnadette Pop, MD   15 mL at 09/17/22 1511   Chlorhexidine Gluconate Cloth 2 % PADS 6 each  6 each Topical Daily Burnadette Pop, MD   6 each at 09/18/22 0831    ciprofloxacin (CIPRO) tablet 500 mg  500 mg Oral BID Burnadette Pop, MD   500 mg at 09/19/22 0948   DULoxetine (CYMBALTA) DR capsule 60 mg  60 mg Oral Daily Burnadette Pop, MD   60 mg at 09/19/22 0948   gabapentin (NEURONTIN) capsule 300 mg  300 mg Oral TID Burnadette Pop, MD   300 mg at 09/19/22 0947   levothyroxine (SYNTHROID) tablet 150 mcg  150 mcg Oral Q0600 Howerter, Justin B, DO   150 mcg at 09/19/22 0551   magnesium sulfate IVPB 4 g 100 mL  4 g Intravenous Once Burnadette Pop, MD 50 mL/hr at 09/19/22 0955 4 g at 09/19/22 0955   melatonin tablet 3 mg  3 mg Oral QHS PRN Howerter, Justin B, DO   3 mg at 09/18/22 2053   ondansetron (ZOFRAN) injection 4 mg  4 mg Intravenous Q6H PRN Howerter, Justin B, DO   4 mg at 09/17/22 1030   pantoprazole (PROTONIX) EC tablet 40 mg  40 mg Oral BID Burnadette Pop, MD   40 mg at 09/19/22 0948   prochlorperazine (COMPAZINE) injection 10 mg  10 mg Intravenous Q6H PRN Burnadette Pop, MD   10 mg at 09/17/22 1149   simvastatin (ZOCOR) tablet 20 mg  20 mg Oral QHS Howerter, Justin B, DO   20 mg at 09/18/22 2053   tamsulosin (FLOMAX) capsule 0.4 mg  0.4 mg Oral BID Howerter, Justin B, DO   0.4 mg at 09/19/22 1610     Discharge Medications: Please see discharge summary for a list of discharge medications.  Relevant Imaging Results:  Relevant Lab Results:   Additional Information SSN [id number]  Eduard Roux, LCSW

## 2022-09-19 NOTE — Progress Notes (Signed)
Pt being transferred to 2w01 via strecher wife and daughter called to notify

## 2022-09-19 NOTE — Progress Notes (Signed)
Physical Therapy Treatment Patient Details Name: [name] MRN: [id number] DOB: 1945/04/17 Today's Date: 09/19/2022   History of Present Illness Pt admitted 6/1 from [facility] with GI bleed (for a month and a half before admission) and acute renal failure due to dehydration from 5 weeks of nausea/vomiting. PMH includes: lumbar spine surgery in 2005, 2022, and 2023, HTN, HLD, and CHF.    PT Comments    Pt agreeable to session, reporting he wants to see if he can get out of bed and walk around. However, due to chronic weakness and immobility, the pt continues to require modA to complete all bed mobility, modA to maintain static sitting balance (posterior lean with inability to correct), and was unable to tolerate for more than 30 sec due to onset of dizziness. Pt then impulsively returning trunk to supine position (unable to lift LE against gravity to return to bed), and BP in that position noted to be 100/66 (78). Pt declined all further attempts to encourage return to sitting or mobility, will continue to benefit from skilled PT to progress strength and activity tolerance. Recommendations remain appropriate.      Recommendations for follow up therapy are one component of a multi-disciplinary discharge planning process, led by the attending physician.  Recommendations may be updated based on patient status, additional functional criteria and insurance authorization.  Follow Up Recommendations  Can patient physically be transported by private vehicle: No    Assistance Recommended at Discharge Frequent or constant Supervision/Assistance  Patient can return home with the following Two people to help with walking and/or transfers;Assistance with cooking/housework;Two people to help with bathing/dressing/bathroom;Assistance with feeding;Direct supervision/assist for medications management;Direct supervision/assist for financial management;Assist for transportation;Help with stairs or ramp  for entrance   Equipment Recommendations  Wheelchair (measurements PT);Wheelchair cushion (measurements PT);Hospital bed (hoyer lift (if he were to return home))    Recommendations for Other Services       Precautions / Restrictions Precautions Precautions: Fall Precaution Comments: possible orthostatic Restrictions Weight Bearing Restrictions: No     Mobility  Bed Mobility Overal bed mobility: Needs Assistance Bed Mobility: Rolling, Sidelying to Sit, Sit to Sidelying Rolling: Min assist Sidelying to sit: Mod assist     Sit to sidelying: Mod assist, +2 for physical assistance General bed mobility comments: modA to complete movement of LE and trunk to sitting EOB, heavy dependence on bed rail. no movement of RLE without assist. pt then neeidng assist for balance and positioning at EOB. assist to return BLE to bed    Transfers                   General transfer comment: pt initially agreeable, then impulsively returning to bed after ~30 sec due to reports of dizziness. declined all further attempts to encourage OOB       Balance Overall balance assessment: Needs assistance Sitting-balance support: Bilateral upper extremity supported, Feet supported Sitting balance-Leahy Scale: Poor Sitting balance - Comments: modA to maintain initially, progressed to minG at times then pt insisting on returning to supine and returning trunk to bed. Postural control: Posterior lean, Right lateral lean                                  Cognition Arousal/Alertness: Awake/alert Behavior During Therapy: Flat affect, Impulsive Overall Cognitive Status: No family/caregiver present to determine baseline cognitive functioning  General Comments: no family present, pt answering questions more consistently. tangential and telling stories about medical hx that he told this same therapist on monday. Pt with poor insight to deficits,  unable to sit EOB for >30 seconds and talking about how he wants his wife to come so that he can get dressed and walk the hallway        Exercises      General Comments General comments (skin integrity, edema, etc.): BP 100/66 (78) after return to supine      Pertinent Vitals/Pain Pain Assessment Pain Assessment: No/denies pain Pain Score: 0-No pain Pain Intervention(s): Monitored during session     PT Goals (current goals can now be found in the care plan section) Acute Rehab PT Goals Patient Stated Goal: to get OOB and walk around PT Goal Formulation: With patient Time For Goal Achievement: 09/30/22 Potential to Achieve Goals: Fair Progress towards PT goals: Progressing toward goals    Frequency    Min 1X/week      PT Plan Current plan remains appropriate       AM-PAC PT "6 Clicks" Mobility   Outcome Measure  Help needed turning from your back to your side while in a flat bed without using bedrails?: Total Help needed moving from lying on your back to sitting on the side of a flat bed without using bedrails?: Total Help needed moving to and from a bed to a chair (including a wheelchair)?: Total Help needed standing up from a chair using your arms (e.g., wheelchair or bedside chair)?: Total Help needed to walk in hospital room?: Total Help needed climbing 3-5 steps with a railing? : Total 6 Click Score: 6    End of Session   Activity Tolerance: Other (comment) (possible orthostatic) Patient left: in bed;with call bell/phone within reach;with bed alarm set Nurse Communication: Mobility status;Need for lift equipment PT Visit Diagnosis: Other abnormalities of gait and mobility (R26.89);Muscle weakness (generalized) (M62.81)     Time: 1610-9604 PT Time Calculation (min) (ACUTE ONLY): 23 min  Charges:  $Therapeutic Exercise: 8-22 mins $Therapeutic Activity: 8-22 mins                     Vickki Muff, PT, DPT   Acute Rehabilitation Department Office  [phone] Secure Chat Communication Preferred   Ronnie Derby 09/19/2022, 9:55 AM

## 2022-09-19 NOTE — TOC Progression Note (Signed)
Transition of Care [facility]) - Progression Note    Patient Details  Name: [name] MRN: [id number] Date of Birth: 1946/02/29  [facility] of [facility]) CM/SW Contact  Eduard Roux, [place] Phone Number: 09/19/2022, 11:38 AM  Clinical Narrative:     Pilar Jarvis- requested to review referral- waiting a decision. CSW will start insurance once SNF bed offer is confirmed.  Antony Blackbird, MSW, LCSW Clinical Social Worker    Expected Discharge Plan: Skilled Nursing Facility Barriers to Discharge: English as a second language teacher, Continued Medical Work up, SNF Pending bed offer  Expected Discharge Plan and Services In-house Referral: Clinical Social Work     Living arrangements for the past 2 months: Single Family Home                                       Social Determinants of Health (SDOH) Interventions SDOH Screenings   Food Insecurity: No Food Insecurity (09/18/2021)  Housing: Low Risk  (09/18/2021)  Transportation Needs: No Transportation Needs (09/18/2021)  Alcohol Screen: Low Risk  (09/18/2021)  Depression (PHQ2-9): Low Risk  (07/09/2022)  Recent Concern: Depression (PHQ2-9) - Medium Risk (06/06/2022)  Financial Resource Strain: Low Risk  (09/18/2021)  Physical Activity: Insufficiently Active (09/18/2021)  Social Connections: Moderately Integrated (09/18/2021)  Stress: No Stress Concern Present (09/18/2021)  Tobacco Use: High Risk (09/18/2022)    Readmission Risk Interventions     No data to display

## 2022-09-20 DIAGNOSIS — N179 Acute kidney failure, unspecified: Secondary | ICD-10-CM | POA: Diagnosis not present

## 2022-09-20 LAB — BASIC METABOLIC PANEL
Anion gap: 7 (ref 5–15)
BUN: 15 mg/dL (ref 8–23)
CO2: 30 mmol/L (ref 22–32)
Calcium: 8.4 mg/dL — ABNORMAL LOW (ref 8.9–10.3)
Chloride: 104 mmol/L (ref 98–111)
Creatinine, Ser: 1.45 mg/dL — ABNORMAL HIGH (ref 0.61–1.24)
GFR, Estimated: 51 mL/min — ABNORMAL LOW (ref >60)
Glucose, Bld: 90 mg/dL (ref 70–99)
Potassium: 3.8 mmol/L (ref 3.5–5.1)
Sodium: 141 mmol/L (ref 135–145)

## 2022-09-20 LAB — MAGNESIUM: Magnesium: 1.7 mg/dL (ref 1.7–2.4)

## 2022-09-20 MED ORDER — SENNA 8.6 MG PO TABS
1.0000 | ORAL_TABLET | Freq: Two times a day (BID) | ORAL | Status: DC
  Administered 2022-09-20: 8.6 mg via ORAL
  Filled 2022-09-20: qty 1

## 2022-09-20 MED ORDER — SENNA 8.6 MG PO TABS: 1.0000 | ORAL_TABLET | Freq: Every day | ORAL | 0 refills | Status: AC

## 2022-09-20 MED ORDER — MAGNESIUM OXIDE 400 MG PO CAPS: 800.0000 mg | ORAL_CAPSULE | Freq: Every day | ORAL | 0 refills | Status: AC

## 2022-09-20 MED ORDER — TRIMETHOPRIM 100 MG PO TABS
100.0000 mg | ORAL_TABLET | Freq: Every day | ORAL | 11 refills | Status: AC
Start: 2022-09-23 — End: ?

## 2022-09-20 MED ORDER — PANTOPRAZOLE SODIUM 40 MG PO TBEC: 40.0000 mg | DELAYED_RELEASE_TABLET | Freq: Two times a day (BID) | ORAL | 1 refills | Status: AC

## 2022-09-20 MED ORDER — BISACODYL 10 MG RE SUPP
10.0000 mg | Freq: Once | RECTAL | Status: AC
  Administered 2022-09-20: 10 mg via RECTAL
  Filled 2022-09-20: qty 1

## 2022-09-20 MED ORDER — TAMSULOSIN HCL 0.4 MG PO CAPS: 0.4000 mg | ORAL_CAPSULE | Freq: Two times a day (BID) | ORAL | Status: AC

## 2022-09-20 MED ORDER — POLYETHYLENE GLYCOL 3350 17 G PO PACK: 17.0000 g | PACK | Freq: Every day | ORAL | 0 refills | Status: AC

## 2022-09-20 MED ORDER — CIPROFLOXACIN HCL 500 MG PO TABS: 500.0000 mg | ORAL_TABLET | Freq: Two times a day (BID) | ORAL | 0 refills | Status: AC

## 2022-09-20 MED ORDER — POLYETHYLENE GLYCOL 3350 17 G PO PACK
17.0000 g | PACK | Freq: Every day | ORAL | Status: DC
  Administered 2022-09-20: 17 g via ORAL
  Filled 2022-09-20: qty 1

## 2022-09-20 NOTE — Progress Notes (Signed)
OT Cancellation Note  Patient Details Name: [name] MRN: [id number] DOB: 21-Apr-1945   Cancelled Treatment:    Reason Eval/Treat Not Completed: Patient declined, no reason specified (patient adamently refusing to work with OT this am stating he just got comfortable and did not want to move. Will attempt again later today as schedule permits.) Alfonse Flavors, OTA Acute Rehabilitation Services  Office [phone]  Dewain Penning 09/20/2022, 8:27 AM

## 2022-09-20 NOTE — Discharge Summary (Addendum)
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  Decreased oral intake.  On presentation, he was hypotensive, lab work showed creatinine of 11.4, hemoglobin of 8.  Patient was also given a unit of PRBC at [facility].  Patient admitted for further management.  GI consulted and following, s/p EGD.  Kidney function significantly improved with IV fluid.  PT/OT recommending skilled nursing facility on discharge.  Medically stable for discharge to SNF whenever possible   Following problems were addressed during the hospitalization:  AKI/hyperkalemia: Previous creatinine was 1.1 as per 07/31/2022.  Presented with creatinine in the range of 11.  This is most likely prerenal AKI because of dehydration from vomiting and diarrhea.  Ultrasound of the kidneys did not show any hydronephrosis but showed bilateral simple renal cyst.  Kidney function has significantly improved with IV fluids.Patient also takes benazepril at home.  Home benazepril on hold.  Foley will be removed, if fails voiding trail,may need to insert Foley again and follow-up with urology   Normocytic anemia: Baseline hemoglobin ranges from 9-12.  Presented with hemoglobin in the range of 8.  No report of hematochezia or melena.  DRE performed in the emergency department showed brown-colored stool.But FOBT came out to be positive.  He was also given a unit of PRBC at [facility].   Currently hemoglobin in the range of 8-9.   Iron level optimal. Due to history of diarrhea, vomiting , elevated lipase, positive FOBT, we consulted GI,s/p EGD on 6/5 which shows gastritis but did not show any ulcer or source of bleeding.  GI recommended to continue Protonix twice daily   Nausea, vomiting, diarrhea/elevated lipase: Ongoing for last 5 weeks.  No fever or leukocytosis.  GI pathogen panel/C diff was sent. Elevated lipase of 600s. CT abdomen/pelvis today without contrast did not show acute findings.  Nausea, vomiting have resolved .  Diarrhea has also resolved.  No bowel movement for last few days, started on MiraLAX and Senokot, ordered Dulcolax suppository   History of chronic UTI: UA showed plenty of bacteria, leukocytes.  On chronic trimethoprim therapy at home.  Urine culture showed Pseudomonas.  Currently on oral Cipro,will complete the course on 6/8   Hypothyroidism: Continue Synthyroid   BPH: Takes Flomax   Hypertension: Takes benazepril at home.  Currently on hold.BP stable   Hyperlipidemia: On simvastatin as an outpatient.   Hypomagnesemia: Persistent.Supplemented aggressively with magnesium.  Continue to monitor levels.  Continue high-dose supplementation on discharge by mouth   Chronic diastolic CHF: Last echo as per October 2020 showed EF of 60 to 65%, impaired relaxation.  He was dehydrated on presentation and was given IV fluid   Infrarenal abdominal aortic aneurysm: CT showed  3.6 cm in diameter, stable . Recommend follow-up ultrasound every 2 years    Confusion/debility/disposition: Patient looks confused.  He is alert and awake and oriented to place only.As per stepdaughter he sleeps all the time,not that ambulatory.daughter said this all started after a back surgery about 9  months ago. He was walking fine before  surgery.He missed his appointments.As per the stepdaughter,he is usually alert and oriented at baseline. CT of the head without any acute findings.   Normal ammonia level.  PT/OT consulted for generalized weakness, recommended SNF on discharge.Vit B12 level more than level,TSH and folate level normal.  MRI of the brain did not show any acute findings but showed chronic small-vessel ischemic,generalized brain atrophy with subjective frontal and temporal lobe predominance. 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   Discharge Diagnoses:  Principal Problem:   ARF (acute renal failure) (HCC) Active Problems:   Essential hypertension   BPH (benign prostatic hyperplasia)   Acquired hypothyroidism   Hypomagnesemia   Hyperkalemia   Acute on chronic anemia   Nausea vomiting and diarrhea   Hyperlipidemia   Chronic diastolic CHF (congestive heart failure) (HCC)   Nausea and vomiting   Normocytic anemia   Anemia of chronic disease   Pressure injury of skin   Heme positive stool   Gastritis and gastroduodenitis    Discharge Instructions  Discharge Instructions     Diet general   Complete by: As directed    Discharge instructions   Complete by: As directed    1)Please take prescribed medications as instructed 2)Do a CBC , BMP and Magnesium  level check in a week   Increase activity slowly   Complete by: As directed    No wound care   Complete by: As directed       Allergies as of 09/20/2022       Reactions   Sulfa Antibiotics Other (See Comments)   Causes blisters and skin to peel from inside mouth        Medication List     STOP taking these medications    benazepril 20 MG tablet Commonly known as: LOTENSIN       TAKE these medications    aspirin 81 MG chewable tablet Chew 1 tablet (81 mg total) by mouth daily.   ciprofloxacin 500 MG tablet Commonly known as: CIPRO Take 1 tablet (500 mg total) by mouth 2 (two) times daily for 1 day. Start taking on: September 21, 2022   cyanocobalamin 1000 MCG tablet Commonly known as: VITAMIN B12 Take 1,000 mcg by mouth daily.   DULoxetine 60 MG capsule Commonly known as:  CYMBALTA TAKE 1 CAPSULE BY MOUTH DAILY   gabapentin 300 MG capsule Commonly known as: NEURONTIN TAKE ONE CAPSULE BY MOUTH THREE TIMES DAILY   levothyroxine 150 MCG tablet Commonly known as: SYNTHROID TAKE 1 TABLET BY MOUTH DAILY BEFORE BREAKFAST   loperamide 2 MG capsule Commonly known as: IMODIUM TAKE ONE CAPSULE BY MOUTH WITH each loose stool AS NEEDED (max of 5 doses in 24 hours) What changed: See the new instructions.   Magnesium Oxide 400 MG Caps Take 2 capsules (800 mg total) by mouth daily.   pantoprazole 40 MG tablet Commonly known as: PROTONIX Take 1 tablet (40 mg total) by mouth 2 (two) times daily.   polyethylene glycol 17 g packet Commonly known as: MIRALAX / GLYCOLAX Take 17 g by mouth daily for 7 days.   PRESERVISION AREDS 2 PO Take 1 capsule by mouth daily.   prochlorperazine 5 MG tablet Commonly known as: COMPAZINE Take 1 tablet (5 mg total) by mouth every 6 (six) hours as needed for nausea or vomiting.   senna 8.6 MG Tabs tablet Commonly known as: SENOKOT Take 1 tablet (8.6 mg total) by mouth daily.   simvastatin 20 MG tablet Commonly known as: ZOCOR TAKE  1 TABLET BY MOUTH AT BEDTIME   tamsulosin 0.4 MG Caps capsule Commonly known as: FLOMAX Take 1 capsule (0.4 mg total) by mouth 2 (two) times daily. What changed: when to take this   triamcinolone cream 0.1 % Commonly known as: KENALOG Apply topically 2 (two) times daily. to affected area(s)   trimethoprim 100 MG tablet Commonly known as: TRIMPEX Take 1 tablet (100 mg total) by mouth at bedtime. Start taking on: September 23, 2022 What changed: These instructions start on September 23, 2022. If you are unsure what to do until then, ask your doctor or other care provider.   Vitamin D3 1.25 MG (50000 UT) Tabs Take 50,000 Units by mouth every Wednesday.        Contact information for after-discharge care     Destination     [facility] .   Service: Skilled Nursing Contact  information: [address] [place] [address] [phone]                    Allergies  Allergen Reactions   Sulfa Antibiotics Other (See Comments)    Causes blisters and skin to peel from inside mouth    Consultations: GI   Procedures/Studies: MR BRAIN WO CONTRAST  Result Date: 09/18/2022 CLINICAL DATA:  Confusion.  Encephalopathy.  Somnolent. EXAM: MRI HEAD WITHOUT CONTRAST TECHNIQUE: Multiplanar, multiecho pulse sequences of the brain and surrounding structures were obtained without intravenous contrast. COMPARISON:  Head CT 09/15/2022 FINDINGS: Brain: Diffusion imaging does not show any acute or subacute infarction or other cause of restricted diffusion. There chronic small-vessel ischemic changes of the pons, more on the right than the left. No focal cerebellar insult. Mild chronic small-vessel ischemic change of the cerebral hemispheric white matter as seen previously. Generalized brain atrophy with subjective frontal and temporal lobe predominance. No mass, hemorrhage, hydrocephalus or extra-axial collection. Vascular: Major vessels at the base of the brain show flow. Skull and upper cervical spine: Negative Sinuses/Orbits: Polyps or cysts in the right maxillary sinus and posterior nasal passages. 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 Non emergent ENT consultation recommended. Orbits negative. Other: Small amount of fluid in the mastoid air cells on the right. IMPRESSION: 1. No acute intracranial finding. Chronic small-vessel ischemic changes of the pons and cerebral hemispheric white matter. Generalized brain atrophy with subjective frontal and temporal lobe predominance. 2. Polyps or cysts in the right maxillary sinus and posterior nasal passages. Small amount of fluid in the mastoid air cells on the right. Right maxillary sinus lesion has MR characteristics which could indicate this represents an  inverted papilloma. Non emergent ENT consultation recommended. Electronically Signed   By: Paulina Fusi M.D.   On: 09/18/2022 13:35   CT ABDOMEN PELVIS WO CONTRAST  Result Date: 09/16/2022 CLINICAL DATA:  Nonbilious vomiting. EXAM: CT ABDOMEN AND PELVIS WITHOUT CONTRAST TECHNIQUE: Multidetector CT imaging of the abdomen and pelvis was performed following the standard protocol without IV contrast. RADIATION DOSE REDUCTION: This exam was performed according to the departmental dose-optimization program which includes automated exposure control, adjustment of the mA and/or kV according to patient size and/or use of iterative reconstruction technique. COMPARISON:  CT angiogram chest abdomen and pelvis 08/01/2022 FINDINGS: Lower chest: No acute abnormality. Hepatobiliary: Gallbladder is distended and small gallstones are present. There is no surrounding inflammation. Bile ducts and liver appear within normal limits. Pancreas: Unremarkable. No pancreatic ductal dilatation or surrounding inflammatory changes. Spleen: Normal in  size without focal abnormality. Adrenals/Urinary Tract: There is a punctate nonobstructing left renal calculus. Bilateral renal cysts are present measuring up to 4.9 cm. There is no hydronephrosis. The adrenal glands are within normal limits. There is a Foley catheter in the bladder. Air in the bladder is likely related to Foley catheter. Stomach/Bowel: Stomach is within normal limits. Appendix appears normal. No evidence of bowel wall thickening, distention, or inflammatory changes. There is sigmoid colon diverticulosis. Vascular/Lymphatic: Aortic atherosclerosis. 3.5 cm infrarenal abdominal aortic aneurysm is unchanged. No enlarged abdominal or pelvic lymph nodes. Reproductive: Prostate is unremarkable. Other: No abdominal wall hernia or abnormality. No abdominopelvic ascites. Musculoskeletal: L1-L5 posterior fusion hardware is present. There are degenerative changes of both hips. The bones are  osteopenic. IMPRESSION: 1. Cholelithiasis. 2. Nonobstructing left renal calculus. 3. 3.5 cm infrarenal abdominal aortic aneurysm. Recommend follow-up every 2 years. 4. Bosniak I benign renal cyst measuring 4.9 cm. No follow-up imaging is recommended. JACR 2018 Feb; 264-273, Management of the Incidental Renal Mass on CT, [employer] 2021; 814-848, Bosniak Classification of Cystic Renal Masses, Version 2019. Aortic Atherosclerosis (ICD10-I70.0). Electronically Signed   By: Darliss Cheney M.D.   On: 09/16/2022 17:49   CT HEAD WO CONTRAST ( )  Result Date: 09/15/2022 CLINICAL DATA:  Delirium. EXAM: CT HEAD WITHOUT CONTRAST TECHNIQUE: Contiguous axial images were obtained from the base of the skull through the vertex without intravenous contrast. RADIATION DOSE REDUCTION: This exam was performed according to the departmental dose-optimization program which includes automated exposure control, adjustment of the mA and/or kV according to patient size and/or use of iterative reconstruction technique. COMPARISON:  CT angio head without contrast 08/01/2022 FINDINGS: Brain: Mild atrophy and white matter changes are stable. Deep brain nuclei are within normal limits. The ventricles are of normal size. No significant extraaxial fluid collection is present. The brainstem and cerebellum are within normal limits. Midline structures are within normal limits. Vascular: Atherosclerotic calcifications present within the cavernous internal carotid arteries. No hyperdense vessel is present. Skull: Calvarium is intact. No focal lytic or blastic lesions are present. No significant extracranial soft tissue lesion is present. Sinuses/Orbits: A polyp or mucous retention cyst is again noted within the right maxillary sinus. Small polyps noted in the inferior right frontal sinus and anterior ethmoid air cells. The paranasal sinuses are otherwise clear. Mastoid air cells are clear. Polypoid lesions are noted in the nasal cavities  bilaterally asymmetric soft tissue at the right nasal choana. Bilateral lens replacements are noted. Globes and orbits are otherwise unremarkable. IMPRESSION: 1. No acute intracranial abnormality or significant interval change. 2. Stable atrophy and white matter disease. This likely reflects the sequela of chronic microvascular ischemia. 3. Polypoid lesions in the nasal cavities and paranasal sinuses bilaterally are stable. Electronically Signed   By: Marin Roberts M.D.   On: 09/15/2022 16:25   US RENAL  Result Date: 09/15/2022 CLINICAL DATA:  [id number] with acute renal failure. EXAM: RENAL / URINARY TRACT ULTRASOUND COMPLETE COMPARISON:  CT without contrast 09/13/2022, CTA chest, abdomen and pelvis 08/01/2022 FINDINGS: Right Kidney: Renal measurements: 12.8 x 5.0 x 6.7 cm = volume: 230 mL. Echogenicity within normal limits. No mass or hydronephrosis visualized. Multiple anechoic thin-walled simple cysts are again noted, largest of these is 4.3 cm. No complex cysts are identified. Left Kidney: Renal measurements: 9.4 x 6.0 by 6.7 cm = volume: 192 mL. Echogenicity within normal limits. No mass or hydronephrosis visualized. Multiple anechoic thin walled simple cysts are again noted, largest of these is 4.1 cm. No  complex cysts are identified. Bladder: Catheterized, contracted and obscured by bowel gas. Other: No free fluid. IMPRESSION: 1. No hydronephrosis or increased cortical echogenicity. 2. Multiple bilateral simple renal cysts. Electronically Signed   By: Almira Bar M.D.   On: 09/15/2022 06:06      Subjective: Patient seen and examined at bedside today.  Hemodynamically stable, comfortable.  This morning he was alert and oriented.  Denies new complaints.I called and discussed discharge planning with daughter on phone  Discharge Exam: Vitals:   09/20/22 0451 09/20/22 0726  BP: (!) 114/93 101/62  Pulse: 86 74  Resp: 18 18  Temp: 98.3 F (36.8 C) 97.7 F (36.5 C)  SpO2: 100% 100%    Vitals:   09/19/22 1555 09/19/22 2148 09/20/22 0451 09/20/22 0726  BP: 104/70 135/82 (!) 114/93 101/62  Pulse: 72 81 86 74  Resp: 16 18 18 18   Temp: (!) 97.5 F (36.4 C) 98.3 F (36.8 C) 98.3 F (36.8 C) 97.7 F (36.5 C)  TempSrc: Oral   Oral  SpO2: 100% 100% 100% 100%  Weight:      Height:        General: Pt is alert, awake, not in acute distress,weak,deconditioned Cardiovascular: RRR, S1/S2 +, no rubs, no gallops Respiratory: CTA bilaterally, no wheezing, no rhonchi Abdominal: Soft, NT, ND, bowel sounds + Extremities: no edema, no cyanosis    The results of significant diagnostics from this hospitalization (including imaging, microbiology, ancillary and laboratory) are listed below for reference.     Microbiology: Recent Results (from the past 240 hour(s))  Blood culture (routine x 2)     Status: None   Collection Time: 09/14/22  5:40 PM   Specimen: BLOOD  Result Value Ref Range Status   Specimen Description BLOOD LEFT ARM  Final   Special Requests   Final    BOTTLES DRAWN AEROBIC AND ANAEROBIC Blood Culture results may not be optimal due to an excessive volume of blood received in culture bottles   Culture   Final    NO GROWTH 5 DAYS Performed at [facility], [address], [place], [place] [address]    Report Status 09/19/2022 FINAL  Final  Blood culture (routine x 2)     Status: None   Collection Time: 09/14/22  7:34 PM   Specimen: BLOOD  Result Value Ref Range Status   Specimen Description BLOOD LEFT ARM  Final   Special Requests   Final    BOTTLES DRAWN AEROBIC AND ANAEROBIC Blood Culture adequate volume   Culture   Final    NO GROWTH 5 DAYS Performed at [facility], [address], [place], [place] [address]    Report Status 09/19/2022 FINAL  Final  Urine Culture (for pregnant, neutropenic or urologic patients or patients with an indwelling urinary catheter)     Status: Abnormal   Collection Time: 09/15/22 12:19 PM   Specimen: Urine,  Clean Catch  Result Value Ref Range Status   Specimen Description URINE, CLEAN CATCH  Final   Special Requests   Final    NONE Performed at [facility], [address], [place], [place] [address]    Culture 40,000 COLONIES/mL PSEUDOMONAS AERUGINOSA (A)  Final   Report Status 09/17/2022 FINAL  Final   Organism ID, Bacteria PSEUDOMONAS AERUGINOSA (A)  Final      Susceptibility   Pseudomonas aeruginosa - MIC*    CEFTAZIDIME 4 SENSITIVE Sensitive     CIPROFLOXACIN <=0.25 SENSITIVE Sensitive  GENTAMICIN <=1 SENSITIVE Sensitive     IMIPENEM <=0.25 SENSITIVE Sensitive     PIP/TAZO 8 SENSITIVE Sensitive     CEFEPIME 2 SENSITIVE Sensitive     * 40,000 COLONIES/mL PSEUDOMONAS AERUGINOSA     Labs: BNP (last 3 results) No results for input(s): "BNP" in the last 8760 hours. Basic Metabolic Panel: Recent Labs  Lab 09/14/22 1740 09/14/22 1751 09/15/22 0129 09/16/22 0138 09/17/22 0231 09/18/22 0202 09/19/22 0229 09/20/22 0949  NA 140   < > 139 140 140 139 138 141  K 5.3*   < > 5.0 4.2 3.7 3.3* 3.7 3.8  CL 112*   < > 113* 109 100 102 102 104  CO2 13*   < > 16* 19* 26 29 30 30   GLUCOSE 96   < > 95 80 87 82 105* 90  BUN 116*   < > 96* 60* 41* 24* 23 15  CREATININE 6.45*   < > 5.06* 2.44* 1.80* 1.47* 1.41* 1.45*  CALCIUM 8.1*   < > 8.1* 7.7* 7.4* 7.4* 7.7* 8.4*  MG 1.1*  --  1.5* 1.3* 1.6* 1.5* 1.5* 1.7  PHOS 4.8*  --  4.2  --   --   --   --   --    < > = values in this interval not displayed.   Liver Function Tests: Recent Labs  Lab 09/14/22 1740 09/15/22 0129  AST 9* 11*  ALT 7 7  ALKPHOS 67 67  BILITOT 0.9 0.9  PROT 5.4* 5.5*  ALBUMIN 2.8* 2.8*   Recent Labs  Lab 09/15/22 0129 09/16/22 0138 09/17/22 0231  LIPASE 375* 615* 209*   Recent Labs  Lab 09/15/22 1205  AMMONIA 17   CBC: Recent Labs  Lab 09/14/22 1740 09/14/22 1751 09/15/22 0129 09/16/22 0138 09/17/22 0231 09/18/22 0202 09/19/22 0229  WBC 4.7  --  5.0 5.5 6.3 5.7 4.9  NEUTROABS 3.9  --   4.1  --   --   --   --   HGB 8.8*   < > 9.0* 9.0* 8.5* 8.2* 8.2*  HCT 27.4*   < > 28.0* 27.0* 25.9* 25.1* 26.3*  MCV 97.2  --  94.0 93.4 92.5 93.0 94.9  PLT 92*  --  97* 113* 96* 92* 83*   < > = values in this interval not displayed.   Cardiac Enzymes: Recent Labs  Lab 09/15/22 0105  CKTOTAL 156   BNP: Invalid input(s): "POCBNP" CBG: Recent Labs  Lab 09/14/22 1611 09/14/22 1631  GLUCAP 69* 124*   D-Dimer No results for input(s): "DDIMER" in the last 72 hours. Hgb A1c No results for input(s): "HGBA1C" in the last 72 hours. Lipid Profile No results for input(s): "CHOL", "HDL", "LDLCALC", "TRIG", "CHOLHDL", "LDLDIRECT" in the last 72 hours. Thyroid function studies No results for input(s): "TSH", "T4TOTAL", "T3FREE", "THYROIDAB" in the last 72 hours.  Invalid input(s): "FREET3" Anemia work up No results for input(s): "VITAMINB12", "FOLATE", "FERRITIN", "TIBC", "IRON", "RETICCTPCT" in the last 72 hours. Urinalysis    Component Value Date/Time   COLORURINE YELLOW 09/14/2022 1723   APPEARANCEUR TURBID (A) 09/14/2022 1723   APPEARANCEUR Hazy (A) 06/11/2021 1122   LABSPEC 1.009 09/14/2022 1723   PHURINE 5.0 09/14/2022 1723   GLUCOSEU NEGATIVE 09/14/2022 1723   HGBUR SMALL (A) 09/14/2022 1723   BILIRUBINUR NEGATIVE 09/14/2022 1723   BILIRUBINUR negative 07/09/2022 1656   BILIRUBINUR Negative 06/11/2021 1122   KETONESUR NEGATIVE 09/14/2022 1723   PROTEINUR 30 (A) 09/14/2022 1723   UROBILINOGEN  0.2 07/09/2022 1656   NITRITE NEGATIVE 09/14/2022 1723   LEUKOCYTESUR LARGE (A) 09/14/2022 1723   Sepsis Labs Recent Labs  Lab 09/16/22 0138 09/17/22 0231 09/18/22 0202 09/19/22 0229  WBC 5.5 6.3 5.7 4.9   Microbiology Recent Results (from the past 240 hour(s))  Blood culture (routine x 2)     Status: None   Collection Time: 09/14/22  5:40 PM   Specimen: BLOOD  Result Value Ref Range Status   Specimen Description BLOOD LEFT ARM  Final   Special Requests   Final     BOTTLES DRAWN AEROBIC AND ANAEROBIC Blood Culture results may not be optimal due to an excessive volume of blood received in culture bottles   Culture   Final    NO GROWTH 5 DAYS Performed at [facility], [address], [place], [place] [address]    Report Status 09/19/2022 FINAL  Final  Blood culture (routine x 2)     Status: None   Collection Time: 09/14/22  7:34 PM   Specimen: BLOOD  Result Value Ref Range Status   Specimen Description BLOOD LEFT ARM  Final   Special Requests   Final    BOTTLES DRAWN AEROBIC AND ANAEROBIC Blood Culture adequate volume   Culture   Final    NO GROWTH 5 DAYS Performed at [facility], [address] [place], [place] [address]    Report Status 09/19/2022 FINAL  Final  Urine Culture (for pregnant, neutropenic or urologic patients or patients with an indwelling urinary catheter)     Status: Abnormal   Collection Time: 09/15/22 12:19 PM   Specimen: Urine, Clean Catch  Result Value Ref Range Status   Specimen Description URINE, CLEAN CATCH  Final   Special Requests   Final    NONE Performed at [facility], [address] [place], [place] [address]    Culture 40,000 COLONIES/mL PSEUDOMONAS AERUGINOSA (A)  Final   Report Status 09/17/2022 FINAL  Final   Organism ID, Bacteria PSEUDOMONAS AERUGINOSA (A)  Final      Susceptibility   Pseudomonas aeruginosa - MIC*    CEFTAZIDIME 4 SENSITIVE Sensitive     CIPROFLOXACIN <=0.25 SENSITIVE Sensitive     GENTAMICIN <=1 SENSITIVE Sensitive     IMIPENEM <=0.25 SENSITIVE Sensitive     PIP/TAZO 8 SENSITIVE Sensitive     CEFEPIME 2 SENSITIVE Sensitive     * 40,000 COLONIES/mL PSEUDOMONAS AERUGINOSA    Please note: You were cared for by a hospitalist during your hospital stay. Once you are discharged, your primary care physician will handle any further medical issues. Please note that NO REFILLS for any discharge medications will be authorized once you are discharged, as it is imperative that  you return to your primary care physician (or establish a relationship with a primary care physician if you do not have one) for your post hospital discharge needs so that they can reassess your need for medications and monitor your lab values.    Time coordinating discharge: 40 minutes  SIGNED:   Burnadette Pop, MD  [facility] 09/20/2022, 1:30 PM Pager [phone]  If 7PM-7AM, please contact night-coverage [email or web address] Password [email or web address]

## 2022-09-20 NOTE — Progress Notes (Signed)
Occupational Therapy Treatment Patient Details Name: [name] MRN: [id number] DOB: 05/02/1945 Today's Date: 09/20/2022   History of present illness Pt admitted 6/1 from [facility] with GI bleed (for a month and a half before admission) and acute renal failure due to dehydration from 5 weeks of nausea/vomiting. PMH includes: lumbar spine surgery in 2005, 2022, and 2023, HTN, HLD, and CHF.   OT comments  Patient received in supine and declined getting to EOB stating that he was getting discharged today to SNF and did not want to get OOB. Patient agreed on bed level activities and performed grooming, rolling in bed and BUE strengthening exercises to assist with UE use for bed mobility. Patient will benefit from continued inpatient follow up therapy, <3 hours/day.   Recommendations for follow up therapy are one component of a multi-disciplinary discharge planning process, led by the attending physician.  Recommendations may be updated based on patient status, additional functional criteria and insurance authorization.    Assistance Recommended at Discharge Frequent or constant Supervision/Assistance  Patient can return home with the following  Two people to help with walking and/or transfers;A lot of help with bathing/dressing/bathroom;Assistance with feeding;Direct supervision/assist for medications management;Direct supervision/assist for financial management;Assist for transportation;Help with stairs or ramp for entrance   Equipment Recommendations  Other (comment) (defer)    Recommendations for Other Services      Precautions / Restrictions Precautions Precautions: Fall Precaution Comments: possible orthostatic Restrictions Weight Bearing Restrictions: No       Mobility Bed Mobility Overal bed mobility: Needs Assistance Bed Mobility: Rolling Rolling: Min assist         General bed mobility comments: rolled side to side in bed to addres bed mobility and  straightening bed pad    Transfers                   General transfer comment: declined     Balance Overall balance assessment: Needs assistance                                         ADL either performed or assessed with clinical judgement   ADL Overall ADL's : Needs assistance/impaired     Grooming: Wash/dry hands;Wash/dry face;Set up;Bed level Grooming Details (indicate cue type and reason): declined getting to EOB                               General ADL Comments: patient declined getting to EOB and performing self care tasks stating he was getting discharged today    Extremity/Trunk Assessment              Vision       Perception     Praxis      Cognition Arousal/Alertness: Awake/alert Behavior During Therapy: Flat affect, Impulsive Overall Cognitive Status: No family/caregiver present to determine baseline cognitive functioning                                 General Comments: able to give correct day, date, place, and was aware of discharge today to SNF        Exercises Exercises: General Upper Extremity General Exercises - Upper Extremity Shoulder Flexion: Strengthening, Both, 20 reps, Supine, Theraband Theraband Level (Shoulder Flexion): Level 2 (Red) Shoulder Horizontal  ABduction: Strengthening, Both, 20 reps, Supine, Theraband Theraband Level (Shoulder Horizontal Abduction): Level 2 (Red) Elbow Flexion: Strengthening, Both, 20 reps, Supine, Theraband Theraband Level (Elbow Flexion): Level 2 (Red) Elbow Extension: Strengthening, Both, 20 reps, Supine, Theraband Theraband Level (Elbow Extension): Level 2 (Red)    Shoulder Instructions       General Comments patient declined getting OOB and/or getting into recliner stating that he was getting discharged today and did not want to do much    Pertinent Vitals/ Pain       Pain Assessment Pain Assessment: Faces Faces Pain Scale: No hurt Pain  Intervention(s): Monitored during session  Home Living                                          Prior Functioning/Environment              Frequency  Min 1X/week        Progress Toward Goals  OT Goals(current goals can now be found in the care plan section)  Progress towards OT goals: Not progressing toward goals - comment (limited treatment)  Acute Rehab OT Goals Patient Stated Goal: go to SNF OT Goal Formulation: With patient Time For Goal Achievement: 09/30/22 Potential to Achieve Goals: Good ADL Goals Pt Will Perform Grooming: with min guard assist;sitting;bed level Pt Will Perform Upper Body Bathing: with min guard assist;bed level;sitting Additional ADL Goal #1: pt will perform bed mobility min guard A in prep for ADLs  Plan Discharge plan remains appropriate    Co-evaluation                 AM-PAC OT "6 Clicks" Daily Activity     Outcome Measure   Help from another person eating meals?: A Little Help from another person taking care of personal grooming?: A Little Help from another person toileting, which includes using toliet, bedpan, or urinal?: A Lot Help from another person bathing (including washing, rinsing, drying)?: A Lot Help from another person to put on and taking off regular upper body clothing?: A Lot Help from another person to put on and taking off regular lower body clothing?: Total 6 Click Score: 13    End of Session    OT Visit Diagnosis: Unsteadiness on feet (R26.81);Other abnormalities of gait and mobility (R26.89);Muscle weakness (generalized) (M62.81)   Activity Tolerance Patient tolerated treatment well   Patient Left in bed;with call bell/phone within reach;with bed alarm set   Nurse Communication Mobility status        Time: 1610-9604 OT Time Calculation (min): 24 min  Charges: OT General Charges $OT Visit: 1 Visit OT Treatments $Therapeutic Activity: 8-22 mins $Therapeutic Exercise: 8-22  mins  Alfonse Flavors, OTA Acute Rehabilitation Services  Office [phone]   Dewain Penning 09/20/2022, 1:50 PM

## 2022-09-20 NOTE — TOC Progression Note (Signed)
Transition of Care [facility]) - Progression Note    Patient Details  Name: [name] MRN: [id number] Date of Birth: 09/09/1945  Transition of Care [facility]) CM/SW Contact  Dellie Burns [place], [place] Phone Number: 09/20/2022, 10:56 AM  Clinical Narrative: Bed offer received from [facility]. Pt, pt's wife, and pt's stepdtr/POA Samara Deist aware of offer and have accepted. Requested CMA start insurance auth. Per MD, pt ready for dc. Confirmed with Heather at [facility] [phone]) they are able to accept over weekend if auth not received today. Will check auth status later today and provide update.   Dellie Burns, MSW, LCSW [phone] (coverage)      Expected Discharge Plan: Skilled Nursing Facility Barriers to Discharge: Insurance Authorization  Expected Discharge Plan and Services In-house Referral: Clinical Social Work     Living arrangements for the past 2 months: Single Family Home                                       Social Determinants of Health (SDOH) Interventions SDOH Screenings   Food Insecurity: No Food Insecurity (09/18/2021)  Housing: Low Risk  (09/18/2021)  Transportation Needs: No Transportation Needs (09/18/2021)  Alcohol Screen: Low Risk  (09/18/2021)  Depression (PHQ2-9): Low Risk  (07/09/2022)  Recent Concern: Depression (PHQ2-9) - Medium Risk (06/06/2022)  Financial Resource Strain: Low Risk  (09/18/2021)  Physical Activity: Insufficiently Active (09/18/2021)  Social Connections: Moderately Integrated (09/18/2021)  Stress: No Stress Concern Present (09/18/2021)  Tobacco Use: High Risk (09/18/2022)    Readmission Risk Interventions     No data to display

## 2022-09-20 NOTE — TOC Transition Note (Signed)
Transition of Care [facility]) - CM/SW Discharge Note   Patient Details  Name: [name] MRN: [id number] Date of Birth: 05-Nov-1945  Transition of Care [facility]) CM/SW Contact:  Deatra [place], [place] Phone Number: 09/20/2022, 2:05 PM   Clinical Narrative: Pt for dc to [facility] today. Auth received and confirmed with Heather at [facility] they are prepared to admit pt to room 413B. Pt aware of dc and reports agreeable. Pt's stepdtr/POA Harlow Ohms and wife Samara Deist also updated. RN provided with number for report and PT[facility] arranged for transport. SW signing off at dc.   Dellie Burns, MSW, LCSW [phone] (coverage)         Final next level of care: Skilled Nursing Facility Barriers to Discharge: No Barriers Identified   Patient Goals and CMS Choice CMS Medicare.gov Compare Post Acute Care list provided to:: Patient Choice offered to / list presented to : Patient  Discharge Placement                Patient chooses bed at: [facility] Patient to be transferred to facility by: [facility] Name of family member notified: Peyton/POA Patient and family notified of of transfer: 09/20/22  Discharge Plan and Services Additional resources added to the After Visit Summary for   In-house Referral: Clinical Social Work                                   Social Determinants of Health (SDOH) Interventions SDOH Screenings   Food Insecurity: No Food Insecurity (09/18/2021)  Housing: Low Risk  (09/18/2021)  Transportation Needs: No Transportation Needs (09/18/2021)  Alcohol Screen: Low Risk  (09/18/2021)  Depression (PHQ2-9): Low Risk  (07/09/2022)  Recent Concern: Depression (PHQ2-9) - Medium Risk (06/06/2022)  Financial Resource Strain: Low Risk  (09/18/2021)  Physical Activity: Insufficiently Active (09/18/2021)  Social Connections: Moderately Integrated (09/18/2021)  Stress: No Stress Concern Present (09/18/2021)  Tobacco Use: High Risk (09/18/2022)     Readmission Risk  Interventions     No data to display

## 2022-09-20 NOTE — Progress Notes (Signed)
Pt arrived to floor in bed on air mattress. Pt alert and oriented denies pain and discomfort. Pt remains on Room air, continues telemetry monitoring , no signs and symptoms of distress noted. Pt call light in reach, bed low and locked.

## 2022-09-22 ENCOUNTER — Encounter (HOSPITAL_COMMUNITY): Payer: Self-pay | Admitting: Gastroenterology

## 2022-09-23 NOTE — Anesthesia Postprocedure Evaluation (Signed)
Anesthesia Post Note  Patient: [name]  Procedure(s) Performed: ESOPHAGOGASTRODUODENOSCOPY (EGD) WITH PROPOFOL BIOPSY     Patient location during evaluation: Endoscopy Anesthesia Type: MAC Level of consciousness: awake and alert Pain management: pain level controlled Vital Signs Assessment: post-procedure vital signs reviewed and stable Respiratory status: spontaneous breathing, nonlabored ventilation, respiratory function stable and patient connected to nasal cannula oxygen Cardiovascular status: stable and blood pressure returned to baseline Postop Assessment: no apparent nausea or vomiting Anesthetic complications: no   No notable events documented.  Last Vitals:  Vitals:   09/20/22 0726 09/20/22 1633  BP: 101/62 113/62  Pulse: 74 76  Resp: 18 18  Temp: 36.5 C 36.4 C  SpO2: 100% 100%    Last Pain:  Vitals:   09/20/22 1633  TempSrc: Oral  PainSc:                  [name] S

## 2022-09-24 ENCOUNTER — Encounter: Payer: Self-pay | Admitting: Gastroenterology

## 2022-09-25 ENCOUNTER — Ambulatory Visit (HOSPITAL_COMMUNITY): Admission: RE | Admit: 2022-09-25 | Payer: 59 | Source: Ambulatory Visit

## 2022-09-25 ENCOUNTER — Ambulatory Visit (HOSPITAL_COMMUNITY): Payer: 59

## 2022-09-26 ENCOUNTER — Encounter (HOSPITAL_COMMUNITY): Payer: Self-pay

## 2022-09-26 ENCOUNTER — Ambulatory Visit (HOSPITAL_COMMUNITY): Payer: 59

## 2022-09-26 ENCOUNTER — Inpatient Hospital Stay (HOSPITAL_COMMUNITY)
Admission: RE | Admit: 2022-09-26 | Discharge: 2022-09-26 | Disposition: A | Discharge status: Home / Self Care | Payer: 59 | Source: Ambulatory Visit | Attending: Neurology | Admitting: Neurology

## 2022-09-30 ENCOUNTER — Ambulatory Visit: Payer: 59 | Admitting: Family Medicine

## 2022-09-30 ENCOUNTER — Telehealth: Payer: Self-pay | Admitting: Neurology

## 2022-09-30 NOTE — Telephone Encounter (Signed)
Received call from [facility] at [facility]. Stated patient is now living at [facility], was supposed to have scans completed last week at [facility], but they were cancelled due to patients declining health. I asked if she had any specifics regarding his health declining, but she stated she was not given much info from clinic staff, just that they wanted her to make a f/u appt for him to discuss if these scans are still needed. I did not see an OV until [facility] October and wasn't sure how you wanted to proceed. Belinda's call back # is [phone].

## 2022-09-30 NOTE — Telephone Encounter (Signed)
Noted  

## 2022-09-30 NOTE — Telephone Encounter (Signed)
Please call back and offer appt with Dr. Epimenio Foot 10/01/22 at 4pm with Dr. Epimenio Foot

## 2022-09-30 NOTE — Telephone Encounter (Signed)
Pt has been scheduled for the 20th of June checking in at 1:30pm

## 2022-10-03 ENCOUNTER — Ambulatory Visit (INDEPENDENT_AMBULATORY_CARE_PROVIDER_SITE_OTHER): Payer: 59 | Admitting: Neurology

## 2022-10-03 ENCOUNTER — Encounter: Payer: Self-pay | Admitting: Neurology

## 2022-10-03 VITALS — BP 112/87 | HR 101 | Ht 79.0 in

## 2022-10-03 DIAGNOSIS — M5033 Other cervical disc degeneration, cervicothoracic region: Secondary | ICD-10-CM

## 2022-10-03 DIAGNOSIS — R269 Unspecified abnormalities of gait and mobility: Secondary | ICD-10-CM | POA: Diagnosis not present

## 2022-10-03 DIAGNOSIS — G959 Disease of spinal cord, unspecified: Secondary | ICD-10-CM

## 2022-10-03 DIAGNOSIS — R200 Anesthesia of skin: Secondary | ICD-10-CM | POA: Diagnosis not present

## 2022-10-03 DIAGNOSIS — Z981 Arthrodesis status: Secondary | ICD-10-CM

## 2022-10-03 MED ORDER — PREGABALIN 100 MG PO CAPS: 100.0000 mg | ORAL_CAPSULE | Freq: Two times a day (BID) | ORAL | 5 refills | Status: AC

## 2022-10-03 MED ORDER — MELOXICAM 7.5 MG PO TABS
7.5000 mg | ORAL_TABLET | Freq: Every day | ORAL | 11 refills | Status: DC
Start: 2022-10-03 — End: 2023-02-24
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
## 2022-10-04 ENCOUNTER — Encounter: Payer: Self-pay | Admitting: Neurology

## 2022-10-16 ENCOUNTER — Encounter: Payer: Self-pay | Admitting: Gastroenterology

## 2022-10-16 ENCOUNTER — Ambulatory Visit (INDEPENDENT_AMBULATORY_CARE_PROVIDER_SITE_OTHER): Payer: 59 | Admitting: Gastroenterology

## 2022-10-16 VITALS — BP 95/57 | HR 76 | Temp 97.2°F | Ht 79.0 in | Wt 220.0 lb

## 2022-10-16 DIAGNOSIS — R112 Nausea with vomiting, unspecified: Secondary | ICD-10-CM

## 2022-10-16 DIAGNOSIS — D696 Thrombocytopenia, unspecified: Secondary | ICD-10-CM | POA: Diagnosis not present

## 2022-10-16 DIAGNOSIS — R748 Abnormal levels of other serum enzymes: Secondary | ICD-10-CM | POA: Insufficient documentation

## 2022-10-16 DIAGNOSIS — K921 Melena: Secondary | ICD-10-CM

## 2022-10-16 DIAGNOSIS — R195 Other fecal abnormalities: Secondary | ICD-10-CM

## 2022-10-16 DIAGNOSIS — K2970 Gastritis, unspecified, without bleeding: Secondary | ICD-10-CM

## 2022-10-16 DIAGNOSIS — D649 Anemia, unspecified: Secondary | ICD-10-CM

## 2022-10-16 NOTE — Progress Notes (Signed)
GI Office Note    Referring Provider: Gilmore Laroche, FNP Primary Care Physician:  Gilmore Laroche, FNP  Primary Gastroenterologist: Hennie Duos. Marletta Lor, DO   Chief Complaint   Chief Complaint  Patient presents with   Blood In Stools     History of Present Illness   [name] is a 77 y.o. male presenting today from [facility] for further evaluation of blood in the stool.    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  ED to ED transfer from [facility] with acute renal failure in the setting of 5 weeks of nausea/vomiting, decreased oral intake, questionable diarrhea.  Initially presenting at [facility] with creatinine of 11.4  Prior to this admission he had been seen at [facility] ED on April 18 with similar complaints and at that time his creatinine was 1.17, hemoglobin 12.3.  Patient received 1 unit of packed red blood cells prior to his transfer from [facility] to [facility]. GI was consulted while inpatient for vomiting, diarrhea, anemia, heme positive stool, elevated lipase.  Initially seen by Dr. Doristine Locks.  Completed EGD showing non h.pylori gastritis. He had two CTs without contrast with normal appearing pancreas and elevated lipase possible due to azotemia?   CT abdomen pelvis without contrast September 16, 2022: IMPRESSION: 1. Cholelithiasis. Gallbladder distended but no surrounding inflammation. No biliary dilation. 2. Nonobstructing left renal calculus. 3. 3.5 cm infrarenal abdominal aortic aneurysm. Recommend follow-up every 2 years. 4. Bosniak  I benign renal cyst measuring 4.9 cm. No follow-up imaging is recommended. JACR 2018 Feb; 264-273, Management of the Incidental Renal Mass on CT, RadioGraphics 2021; 814-848, Bosniak Classification of Cystic Renal Masses, Version 2019. Aortic Atherosclerosis (ICD10-I70.0).   CT abdomen pelvis without contrast Sep 13, 2022 at [facility]: 1. A specific cause for the patient's nausea and vomiting is not  identified.  2. Infrarenal abdominal aortic aneurysm 3.6 cm in diameter, stable  by my measurements. Recommend follow-up ultrasound every 2 years.  This recommendation follows ACR consensus guidelines: White Paper of  the ACR Incidental Findings Committee II on Vascular Findings. J Am  Coll Radiol 2013; 10:789-794.  3. Cholelithiasis.  4. Sigmoid colon diverticulosis.  5. Generalized regional muscular atrophy.  6. Multilevel foraminal impingement in the lumbar spine due to  congenitally short pedicles. Multilevel lumbar fusion.  7. Prominent degenerative hip arthropathy bilaterally.  8. Stable bilateral renal lesions, not changed from 08/01/2022 where  these were deemed benign. No further imaging workup of these lesions  is indicated.  9. Aortic and coronary atherosclerosis.   EGD September 18, 2022: -Normal esophagus -Gastritis status post biopsy, H. pylori negative.  Today: Patient reports he had a Cologuard last year, review of records indicates a negative Cologuard June 2023.  He has a history of receiving colonoscopies about every 5 years since age 60 presumably due to history of colon polyps. Records not available.  Looking through the records, his hemoglobin in the past year has ranged anywhere from 9-12. In April, Hgb 12.3. Presented to the ED at [facility] after several weeks of nausea/vomiting/diarrhea, his hemoglobin was 8.0.  He received 1 unit of packed red blood cells at that time due  to decline in hemoglobin and hypotension.  While inpatient last month at [facility], his  hemoglobin initially was 8.2 and remained stable.  At discharge 5 days later his hemoglobin was 8.2.  Iron saturation 75%, TIBC 150, serum iron 112, ferritin 243.  B12 2219. Creatinine at discharge 1.45. Lipase 375-->615-->209. Platelets normal in 07/2022 but down to 83000 during hospitalization.    Patient states his appetite has returned.  He has been eating very well over the past several weeks.  No vomiting.  No heartburn or dysphagia.  No abdominal pain.  Denies any reports of black or bloody stools.  States he had terrible diarrhea after recent laxative.  Medications   Current Outpatient Medications  Medication Sig Dispense Refill   acetaminophen (TYLENOL) 325 MG tablet Take 650 mg by mouth 3 (three) times daily.     ascorbic acid (VITAMIN C) 500 MG tablet Take 500 mg by mouth daily.     aspirin 81 MG chewable tablet Chew 1 tablet (81 mg total) by mouth daily. 90 tablet 0   Cholecalciferol (VITAMIN D3) 1.25 MG (50000 UT) TABS Take 50,000 Units by mouth every Wednesday.     DULoxetine HCl 60 MG CSDR Take 1 capsule by mouth daily.     ferrous sulfate 325 (65 FE) MG tablet Take 325 mg by mouth daily with breakfast.     levothyroxine (SYNTHROID) 150 MCG tablet TAKE 1 TABLET BY MOUTH DAILY BEFORE BREAKFAST 90 tablet 2   Magnesium Oxide 400 MG CAPS Take 2 capsules (800 mg total) by mouth daily.  0   meloxicam (MOBIC) 7.5 MG tablet Take 1 tablet (7.5 mg total) by mouth daily. 30 tablet 11   Multiple Vitamins-Minerals (PRESERVISION AREDS 2 PO) Take 1 capsule by mouth daily.     pantoprazole (PROTONIX) 40 MG tablet Take 1 tablet (40 mg total) by mouth 2 (two) times daily. 60 tablet 1   pregabalin (LYRICA) 100 MG capsule Take 1 capsule (100 mg total) by mouth 2 (two) times daily. 60 capsule 5   senna (SENOKOT) 8.6 MG TABS tablet Take 1 tablet (8.6 mg total) by mouth daily. 120 tablet 0   simvastatin (ZOCOR) 20 MG tablet TAKE 1 TABLET BY MOUTH AT BEDTIME 90 tablet 0   sucralfate (CARAFATE) 1 g tablet  Take 1 g by mouth 4 (four) times daily -  with meals and at bedtime.     tamsulosin (FLOMAX) 0.4 MG CAPS capsule Take 1 capsule (0.4 mg total) by mouth 2 (two) times daily. 30 capsule    trimethoprim (TRIMPEX) 100 MG tablet Take 1 tablet (100 mg total) by mouth at bedtime. 30 tablet 11   vitamin B-12 (CYANOCOBALAMIN) 1000 MCG tablet Take 1,000 mcg by mouth daily.     No current facility-administered medications for this visit.    Allergies   Allergies as of 10/16/2022 - Review Complete 10/16/2022  Allergen Reaction Noted   Sulfa antibiotics Other (See Comments) 11/01/2011   Hydrocodone  10/16/2022    Past Medical History   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   Nausea vomiting and diarrhea 09/14/2022   Restless leg syndrome  Past Surgical History   Past Surgical History:  Procedure Laterality Date   BACK SURGERY     Fusion 2005   BIOPSY  09/18/2022   Procedure: BIOPSY;  Surgeon: Napoleon Form, MD;  Location: [facility];  Service: Gastroenterology;;   ESOPHAGOGASTRODUODENOSCOPY (EGD) WITH PROPOFOL N/A 09/18/2022   Procedure: ESOPHAGOGASTRODUODENOSCOPY (EGD) WITH PROPOFOL;  Surgeon: Napoleon Form, MD;  Location: [facility] ENDOS[facility];  Service: Gastroenterology;  Laterality: N/A;   Graves Disease     JOINT REPLACEMENT  1970   Right knee    LAMINECTOMY WITH POSTERIOR LATERAL ARTHRODESIS LEVEL 2 N/A 06/29/2021   Procedure: Laminectomy - Lumbar one-two, Lumbar two-three - redo with posterior lateral fusion with pedicle screws;  Surgeon: Julio Sicks, MD;  Location: [facility] OR;  Service: Neurosurgery;  Laterality: N/A;   NECK SURGERY     POSTERIOR CERVICAL FUSION/FORAMINOTOMY  12/05/2011   Procedure:  POSTERIOR CERVICAL FUSION/FORAMINOTOMY LEVEL 4;  Surgeon: Reinaldo Meeker, MD;  Location: [facility] NEURO ORS;  Service: Neurosurgery;  Laterality: N/A;  Posterior Cervical fixation Cervical four-Seven,Cervical Decompresson Cervical three   THORACIC DISCECTOMY N/A 01/08/2021   Procedure: Thoracic One-Two, Thoracic Two-Three, Thoracic Eleven-Twelve, Thoracic Twelve-Lumbar One Laminectomy;  Surgeon: Julio Sicks, MD;  Location: [facility] OR;  Service: Neurosurgery;  Laterality: N/A;    Past Family History   Family History  Problem Relation Age of Onset   Heart attack Mother 55   Heart disease Father     Past Social History   Social History   Socioeconomic History   Marital status: Married    Spouse name: Keane Scrape. [place]   Number of children: 1   Years of education: 12   Highest education level: Not on file  Occupational History   Not on file  Tobacco Use   Smoking status: Some Days    Packs/day: 0.25    Years: 50.00    Additional pack years: 0.00    Total pack years: 12.50    Types: Cigarettes   Smokeless tobacco: Never  Vaping Use   Vaping Use: Never used  Substance and Sexual Activity   Alcohol use: No   Drug use: Yes   Sexual activity: Not on file  Other Topics Concern   Not on file  Social History Narrative   Right handed   Lives at SNF   No caffeine use   Social Determinants of Health   Financial Resource Strain: Low Risk  (09/18/2021)   Overall Financial Resource Strain (CARDIA)    Difficulty of Paying Living Expenses: Not hard at all  Food Insecurity: No Food Insecurity (09/18/2021)   Hunger Vital Sign    Worried About Running Out of Food in the Last Year: Never true    Ran Out of Food in the Last Year: Never true  Transportation Needs: No Transportation Needs (09/18/2021)   PRAPARE - Administrator, Civil Service (Medical): No    Lack of Transportation (Non-Medical): No  Physical Activity: Insufficiently Active (09/18/2021)   Exercise Vital Sign    Days of  Exercise per Week: 5 days    Minutes of Exercise per Session: 20 min  Stress: No Stress Concern Present (09/18/2021)   [employer] of Occupational Health - Occupational Stress Questionnaire    Feeling of Stress : Not at all  Social Connections: Moderately Integrated (09/18/2021)   Social Connection and Isolation Panel [NHANES]    Frequency of Communication with Friends and Family: More than three times a week    Frequency of Social  Gatherings with Friends and Family: More than three times a week    Attends Religious Services: More than 4 times per year    Active Member of [employer] or Organizations: No    Attends Banker Meetings: Never    Marital Status: Married  Catering manager Violence: Not At Risk (09/18/2021)   Humiliation, Afraid, Rape, and Kick questionnaire    Fear of Current or Ex-Partner: No    Emotionally Abused: No    Physically Abused: No    Sexually Abused: No    Review of Systems   General: Negative for anorexia, weight loss, fever, chills, fatigue, +weakness. Eyes: Negative for vision changes.  ENT: Negative for hoarseness, difficulty swallowing , nasal congestion. CV: Negative for chest pain, angina, palpitations, dyspnea on exertion, peripheral edema.  Respiratory: Negative for dyspnea at rest, dyspnea on exertion, cough, sputum, wheezing.  GI: See history of present illness. GU:  Negative for dysuria, hematuria, urinary incontinence, urinary frequency, nocturnal urination.  MS: + for joint pain, low back pain.  Derm: Negative for rash or itching.  Neuro: Negative for weakness, abnormal sensation, seizure, frequent headaches, memory loss,  confusion.  Psych: Negative for anxiety, depression, suicidal ideation, hallucinations.  Endo: Negative for unusual weight change.  Heme: Negative for bruising or bleeding. Allergy: Negative for rash or hives.  Physical Exam   BP (!) 95/57 (BP Location: Left Arm, Patient Position: Sitting, Cuff Size: Normal)    Pulse 76   Temp (!) 97.2 F (36.2 C) (Temporal)   Ht 6\' 7"  (2.007 m)   Wt 220 lb (99.8 kg) Comment: facility reported 10/09/2022  SpO2 92%   BMI 24.78 kg/m    General: chronically ill-appearing in no acute distress.  Head: Normocephalic, atraumatic.   Eyes: Conjunctiva pink, no icterus. Mouth: Oropharyngeal mucosa moist and pink   Neck: Supple without thyromegaly, masses, or lymphadenopathy.  Lungs: Clear to auscultation bilaterally.  Heart: Regular rate and rhythm, no murmurs rubs or gallops.  Abdomen: Bowel sounds are normal, nontender, nondistended, no hepatosplenomegaly or masses,  no abdominal bruits or hernia, no rebound or guarding.   Rectal: not performed Extremities: No lower extremity edema. No clubbing or deformities.  Neuro: Alert and oriented x 4 , grossly normal neurologically.  Skin: Warm and dry, no rash or jaundice.   Psych: Alert and cooperative, normal mood and affect.  Labs   Lab Results  Component Value Date   CREATININE 1.45 (H) 09/20/2022   BUN 15 09/20/2022   NA 141 09/20/2022   K 3.8 09/20/2022   CL 104 09/20/2022   CO2 30 09/20/2022   Lab Results  Component Value Date   WBC 4.9 09/19/2022   HGB 8.2 (L) 09/19/2022   HCT 26.3 (L) 09/19/2022   MCV 94.9 09/19/2022   PLT 83 (L) 09/19/2022   Lab Results  Component Value Date   ALT 7 09/15/2022   AST 11 (L) 09/15/2022   ALKPHOS 67 09/15/2022   BILITOT 0.9 09/15/2022   Lab Results  Component Value Date   LIPASE 209 (H) 09/17/2022   Lab Results  Component Value Date   TSH 0.553 09/14/2022   Lab Results  Component Value Date   HGBA1C 5.3 07/09/2022   Lab Results  Component Value Date   IRON 112 09/15/2022   TIBC 150 (L) 09/15/2022   FERRITIN 243 09/15/2022   Lab Results  Component Value Date   VITAMINB12 2,290 (H) 09/14/2022   Lab Results  Component Value Date   FOLATE 9.0  09/14/2022    Imaging Studies   MR BRAIN WO CONTRAST  Result Date: 09/18/2022 CLINICAL DATA:   Confusion.  Encephalopathy.  Somnolent. EXAM: MRI HEAD WITHOUT CONTRAST TECHNIQUE: Multiplanar, multiecho pulse sequences of the brain and surrounding structures were obtained without intravenous contrast. COMPARISON:  Head CT 09/15/2022 FINDINGS: Brain: Diffusion imaging does not show any acute or subacute infarction or other cause of restricted diffusion. There chronic small-vessel ischemic changes of the pons, more on the right than the left. No focal cerebellar insult. Mild chronic small-vessel ischemic change of the cerebral hemispheric white matter as seen previously. Generalized brain atrophy with subjective frontal and temporal lobe predominance. No mass, hemorrhage, hydrocephalus or extra-axial collection. Vascular: Major vessels at the base of the brain show flow. Skull and upper cervical spine: Negative Sinuses/Orbits: Polyps or cysts in the right maxillary sinus and posterior nasal passages. 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 Non emergent ENT consultation recommended. Orbits negative. Other: Small amount of fluid in the mastoid air cells on the right. IMPRESSION: 1. No acute intracranial finding. Chronic small-vessel ischemic changes of the pons and cerebral hemispheric white matter. Generalized brain atrophy with subjective frontal and temporal lobe predominance. 2. Polyps or cysts in the right maxillary sinus and posterior nasal passages. Small amount of fluid in the mastoid air cells on the right. 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 Non emergent ENT consultation recommended. Electronically Signed   By: Paulina Fusi M.D.   On: 09/18/2022 13:35   CT ABDOMEN PELVIS WO CONTRAST  Result Date: 09/16/2022 CLINICAL DATA:  Nonbilious vomiting. EXAM: CT ABDOMEN AND PELVIS WITHOUT CONTRAST TECHNIQUE: Multidetector CT imaging of the abdomen and pelvis was performed  following the standard protocol without IV contrast. RADIATION DOSE REDUCTION: This exam was performed according to the departmental dose-optimization program which includes automated exposure control, adjustment of the mA and/or kV according to patient size and/or use of iterative reconstruction technique. COMPARISON:  CT angiogram chest abdomen and pelvis 08/01/2022 FINDINGS: Lower chest: No acute abnormality. Hepatobiliary: Gallbladder is distended and small gallstones are present. There is no surrounding inflammation. Bile ducts and liver appear within normal limits. Pancreas: Unremarkable. No pancreatic ductal dilatation or surrounding inflammatory changes. Spleen: Normal in size without focal abnormality. Adrenals/Urinary Tract: There is a punctate nonobstructing left renal calculus. Bilateral renal cysts are present measuring up to 4.9 cm. There is no hydronephrosis. The adrenal glands are within normal limits. There is a Foley catheter in the bladder. Air in the bladder is likely related to Foley catheter. Stomach/Bowel: Stomach is within normal limits. Appendix appears normal. No evidence of bowel wall thickening, distention, or inflammatory changes. There is sigmoid colon diverticulosis. Vascular/Lymphatic: Aortic atherosclerosis. 3.5 cm infrarenal abdominal aortic aneurysm is unchanged. No enlarged abdominal or pelvic lymph nodes. Reproductive: Prostate is unremarkable. Other: No abdominal wall hernia or abnormality. No abdominopelvic ascites. Musculoskeletal: L1-L5 posterior fusion hardware is present. There are degenerative changes of both hips. The bones are osteopenic. IMPRESSION: 1. Cholelithiasis. 2. Nonobstructing left renal calculus. 3. 3.5 cm infrarenal abdominal aortic aneurysm. Recommend follow-up every 2 years. 4. Bosniak I benign renal cyst measuring 4.9 cm. No follow-up imaging is recommended. JACR 2018 Feb; 264-273, Management of the Incidental Renal Mass on CT, RadioGraphics 2021; 814-848,  Bosniak Classification of Cystic Renal Masses, Version 2019. Aortic Atherosclerosis (ICD10-I70.0). Electronically Signed   By: Darliss Cheney M.D.   On: 09/16/2022 17:49  Assessment   Acute on chronic anemia: hemoccult positive stool, no overt GI bleeding (in setting of reported five week history of N/V/D). EGD with non-H.pylori gastritis. Unclear when last colonoscopy completed, none seen in local records, patient moved back to the area in 2020. He believes he may have had one in [place] since then, we have requested records. At any rate, he has had recent decline in Hgb without notable source. He may require updated colonoscopy.  Iron saturation is elevated at 75%, possibly due to prior transfusion.  Thrombocytopenia: in setting of recent illness, will follow up labs.   N/V/D: for 5 weeks prompting hospitalization. Associated with acute renal failure with Creatinine over 11 at time of presentation. At time of discharge, his creatinine was almost back to normal. At baseline had normal creatinine in 07/2022. He is cholelithiasis and on imaging had distended gallbladder without findings suggestive of acute cholecystitis.  Lipase was elevated but pancreas appeared unremarkable on 2 noncontrast CTs.  Significance not clear, could have been exacerbated by azotemia.  At this time patient is asymptomatic.     PLAN   Retrieve copy of recent labs done for nursing home. Request records of last colonoscopy at [facility]. Consider follow-up lipase if not already done.  Consider colonoscopy pending lab review.    Leanna Battles. Melvyn Neth, MHS, PA-C [facility]

## 2022-10-16 NOTE — Patient Instructions (Signed)
Please send copy of latest labs including CBC/iron/TIBC/ferritin done in the last 2 to 3 weeks.  Please fax to [phone]. We will be in touch to schedule colonoscopy in the near future.

## 2022-10-21 ENCOUNTER — Telehealth: Payer: Self-pay | Admitting: *Deleted

## 2022-10-21 NOTE — Telephone Encounter (Signed)
Received call from [place] and [facility] asking about update on schedule procedure for patient. Advised was waiting on labs to review. I see notes scanned in media. Fowarding to leslie.

## 2022-10-22 NOTE — Telephone Encounter (Signed)
Reviewed labs but these were old, from his [facility] admission.  We need updated labs: CBC BMET Iron/tibc/ferritin Lipase  Dx: anemia, elevated lipase  Once these are received we can determine next step in care which is likely colonoscopy.

## 2022-10-23 NOTE — Telephone Encounter (Signed)
Spoke with pt's nurse Sheria Lang at [facility] and she is going to see if the pt has had the recommended labs done recently and if not she is going to find out what needs to be done to get them done. She is going to call me back with the information.

## 2022-10-23 NOTE — Telephone Encounter (Signed)
Labs from facility are in box for review. Let me know what labs you want updated.

## 2022-10-25 NOTE — Telephone Encounter (Signed)
Labs from 10/14/22: WBC 5100, Hgb 8.6, Hct 27.7, platelets 189000 Iron 70, iron sat 35, tibc 199  09/2022: B12 1002 09/26/22: cre 1.33, tbili 0.3, ap 80, ast 24, alt 17  Please let SNF know, I have reviewed labs provided.   We will plan on colonoscopy but I need an updated BMET, lipase. Once we get this we will move forward with colonoscopy. Important to know current renal function before providing a bowel prep for patient. Thanks. Dx: anemia, acute kidney injury, elevated lipase

## 2022-10-28 ENCOUNTER — Telehealth: Payer: Self-pay | Admitting: Gastroenterology

## 2022-10-28 NOTE — Telephone Encounter (Signed)
Belinda from [facility] called to see if patient was being scheduled for a colonoscopy.  I saw the last note from [place] and told her what that said and she asked if we drew the labs or did they.... I asked if she wanted to speak to the nurse and she said she was going to have her supervisor call the office back.

## 2022-10-29 NOTE — Telephone Encounter (Signed)
Lmom requesting return call from nursing superviser Aurea Graff

## 2022-10-29 NOTE — Telephone Encounter (Signed)
Spoke with RN supervisor Aurea Graff and she will have labs done and results faxed over once completed.

## 2022-11-03 NOTE — Telephone Encounter (Signed)
Reviewed latest labs under media. Creatinine and lipase normal.  Ok to schedule colonoscopy with Dr. Marletta Lor, ASA 3. Acute on chronic anemia, heme + stool.   Hold iron for 7 days before.  Bisacodyl 10mg  daily for 3 days before bowel prep.  Recommend trilyte prep.

## 2022-11-04 NOTE — Telephone Encounter (Signed)
Received VM from [place]. Called back and could not get answer from [place]

## 2022-11-04 NOTE — Telephone Encounter (Signed)
Called [employer]. Was advised needed to speak with Belinda to schedule. Was transferred and [facility]

## 2022-11-05 NOTE — Telephone Encounter (Signed)
LMTCB for [employer]

## 2022-11-05 NOTE — Telephone Encounter (Signed)
Received VM from Belinda and I called [place] back at # she left, line rang numerous times, no answer and was not able to leave VM

## 2022-11-06 MED ORDER — FLEET ENEMA 7-19 GM/118ML RE ENEM: 1.0000 | ENEMA | Freq: Once | RECTAL | 0 refills | Status: AC

## 2022-11-06 MED ORDER — BISACODYL 5 MG PO TBEC: 10.0000 mg | DELAYED_RELEASE_TABLET | Freq: Once | ORAL | 0 refills | Status: AC

## 2022-11-06 MED ORDER — PEG 3350-KCL-NA BICARB-NACL 420 G PO SOLR: 4000.0000 mL | Freq: Once | ORAL | 0 refills | Status: AC

## 2022-11-06 NOTE — Addendum Note (Signed)
Addended by: Armstead Peaks on: 11/06/2022 08:19 AM   Modules accepted: Orders

## 2022-11-06 NOTE — Telephone Encounter (Signed)
Spoke with [employer]. Pt scheduled for 8/16. She advised me to fax instructions/pre-op appt to her at [phone]. Advised me to send rx for prep to [facility].

## 2022-11-08 ENCOUNTER — Encounter: Payer: Self-pay | Admitting: *Deleted

## 2022-11-21 NOTE — Patient Instructions (Signed)
[name]  11/21/2022     @PREFPERIOPPHARMACY @   Your procedure is scheduled on  11/29/2022.   Report to [facility] at  1140  A.M.   Call this number if you have problems the morning of surgery:  [phone]  If you experience any cold or flu symptoms such as cough, fever, chills, shortness of breath, etc. between now and your scheduled surgery, please notify us at the above number.   Remember:  Follow the diet and prep instructions given to you by the office.     Take these medicines the morning of surgery with A SIP OF WATER       duloxetine, levothyroxine, mobic(if needed), pantoprazole, lyrica.     Do not wear jewelry, make-up or nail polish, including gel polish,  artificial nails, or any other type of covering on natural nails (fingers and  toes).  Do not wear lotions, powders, or perfumes, or deodorant.  Do not shave 48 hours prior to surgery.  Men may shave face and neck.  Do not bring valuables to the hospital.  [facility] is not responsible for any belongings or valuables.  Contacts, dentures or bridgework may not be worn into surgery.  Leave your suitcase in the car.  After surgery it may be brought to your room.  For patients admitted to the hospital, discharge time will be determined by your treatment team.  Patients discharged the day of surgery will not be allowed to drive home and must have someone with them for 24 hours.    Special instructions:   DO NOT smoke tobacco or vape for 24 hours before your procedure.  Please read over the following fact sheets that you were given. Anesthesia Post-op Instructions and Care and Recovery After Surgery       Colonoscopy, Adult, Care After The following information offers guidance on how to care for yourself after your procedure. Your health care provider may also give you more specific instructions. If you have problems or questions, contact your health care provider. What can I expect after  the procedure? After the procedure, it is common to have: A small amount of blood in your stool for 24 hours after the procedure. Some gas. Mild cramping or bloating of your abdomen. Follow these instructions at home: Eating and drinking  Drink enough fluid to keep your urine pale yellow. Follow instructions from your health care provider about eating or drinking restrictions. Resume your normal diet as told by your health care provider. Avoid heavy or fried foods that are hard to digest. Activity Rest as told by your health care provider. Avoid sitting for a long time without moving. Get up to take short walks every 1-2 hours. This is important to improve blood flow and breathing. Ask for help if you feel weak or unsteady. Return to your normal activities as told by your health care provider. Ask your health care provider what activities are safe for you. Managing cramping and bloating  Try walking around when you have cramps or feel bloated. If directed, apply heat to your abdomen as told by your health care provider. Use the heat source that your health care provider recommends, such as a moist heat pack or a heating pad. Place a towel between your skin and the heat source. Leave the heat on for 20-30 minutes. Remove the heat if your skin turns bright red. This is especially important if you are unable to feel pain, heat, or cold.  You have a greater risk of getting burned. General instructions If you were given a sedative during the procedure, it can affect you for several hours. Do not drive or operate machinery until your health care provider says that it is safe. For the first 24 hours after the procedure: Do not sign important documents. Do not drink alcohol. Do your regular daily activities at a slower pace than normal. Eat soft foods that are easy to digest. Take over-the-counter and prescription medicines only as told by your health care provider. Keep all follow-up visits.  This is important. Contact a health care provider if: You have blood in your stool 2-3 days after the procedure. Get help right away if: You have more than a small spotting of blood in your stool. You have large blood clots in your stool. You have swelling of your abdomen. You have nausea or vomiting. You have a fever. You have increasing pain in your abdomen that is not relieved with medicine. These symptoms may be an emergency. Get help right away. Call 911. Do not wait to see if the symptoms will go away. Do not drive yourself to the hospital. Summary After the procedure, it is common to have a small amount of blood in your stool. You may also have mild cramping and bloating of your abdomen. If you were given a sedative during the procedure, it can affect you for several hours. Do not drive or operate machinery until your health care provider says that it is safe. Get help right away if you have a lot of blood in your stool, nausea or vomiting, a fever, or increased pain in your abdomen. This information is not intended to replace advice given to you by your health care provider. Make sure you discuss any questions you have with your health care provider. Document Revised: 05/14/2022 Document Reviewed: 11/22/2020 Elsevier Patient Education  2024 [employer] Monitored Anesthesia Care, Care After The following information offers guidance on how to care for yourself after your procedure. Your health care provider may also give you more specific instructions. If you have problems or questions, contact your health care provider. What can I expect after the procedure? After the procedure, it is common to have: Tiredness. Little or no memory about what happened during or after the procedure. Impaired judgment when it comes to making decisions. Nausea or vomiting. Some trouble with balance. Follow these instructions at home: For the time period you were told by your health care  provider:  Rest. Do not participate in activities where you could fall or become injured. Do not drive or use machinery. Do not drink alcohol. Do not take sleeping pills or medicines that cause drowsiness. Do not make important decisions or sign legal documents. Do not take care of children on your own. Medicines Take over-the-counter and prescription medicines only as told by your health care provider. If you were prescribed antibiotics, take them as told by your health care provider. Do not stop using the antibiotic even if you start to feel better. Eating and drinking Follow instructions from your health care provider about what you may eat and drink. Drink enough fluid to keep your urine pale yellow. If you vomit: Drink clear fluids slowly and in small amounts as you are able. Clear fluids include water, ice chips, low-calorie sports drinks, and fruit juice that has water added to it (diluted fruit juice). Eat light and bland foods in small amounts as you are able. These foods include bananas, applesauce,  rice, lean meats, toast, and crackers. General instructions  Have a responsible adult stay with you for the time you are told. It is important to have someone help care for you until you are awake and alert. If you have sleep apnea, surgery and some medicines can increase your risk for breathing problems. Follow instructions from your health care provider about wearing your sleep device: When you are sleeping. This includes during daytime naps. While taking prescription pain medicines, sleeping medicines, or medicines that make you drowsy. Do not use any products that contain nicotine or tobacco. These products include cigarettes, chewing tobacco, and vaping devices, such as e-cigarettes. If you need help quitting, ask your health care provider. Contact a health care provider if: You feel nauseous or vomit every time you eat or drink. You feel light-headed. You are still sleepy or  having trouble with balance after 24 hours. You get a rash. You have a fever. You have redness or swelling around the IV site. Get help right away if: You have trouble breathing. You have new confusion after you get home. These symptoms may be an emergency. Get help right away. Call 911. Do not wait to see if the symptoms will go away. Do not drive yourself to the hospital. This information is not intended to replace advice given to you by your health care provider. Make sure you discuss any questions you have with your health care provider. Document Revised: 08/27/2021 Document Reviewed: 08/27/2021 Elsevier Patient Education  2024 [employer].

## 2022-11-26 ENCOUNTER — Encounter (HOSPITAL_COMMUNITY)
Admission: RE | Admit: 2022-11-26 | Discharge: 2022-11-26 | Disposition: A | Discharge status: Home / Self Care | Payer: Medicare Other | Source: Ambulatory Visit | Attending: Internal Medicine | Admitting: Internal Medicine

## 2022-11-26 VITALS — BP 120/64 | HR 69 | Temp 97.8°F | Resp 18 | Ht 79.0 in | Wt 220.0 lb

## 2022-11-26 DIAGNOSIS — Z01812 Encounter for preprocedural laboratory examination: Secondary | ICD-10-CM | POA: Insufficient documentation

## 2022-11-26 DIAGNOSIS — D649 Anemia, unspecified: Secondary | ICD-10-CM | POA: Insufficient documentation

## 2022-11-26 DIAGNOSIS — D638 Anemia in other chronic diseases classified elsewhere: Secondary | ICD-10-CM | POA: Diagnosis not present

## 2022-11-26 DIAGNOSIS — Z01818 Encounter for other preprocedural examination: Secondary | ICD-10-CM | POA: Diagnosis present

## 2022-11-26 LAB — CBC WITH DIFFERENTIAL/PLATELET
Abs Immature Granulocytes: 0.01 10*3/uL (ref 0.00–0.07)
Basophils Absolute: 0 10*3/uL (ref 0.0–0.1)
Basophils Relative: 1 %
Eosinophils Absolute: 0.2 10*3/uL (ref 0.0–0.5)
Eosinophils Relative: 3 %
HCT: 31.4 % — ABNORMAL LOW (ref 39.0–52.0)
Hemoglobin: 10 g/dL — ABNORMAL LOW (ref 13.0–17.0)
Immature Granulocytes: 0 %
Lymphocytes Relative: 20 %
Lymphs Abs: 1.1 10*3/uL (ref 0.7–4.0)
MCH: 33 pg (ref 26.0–34.0)
MCHC: 31.8 g/dL (ref 30.0–36.0)
MCV: 103.6 fL — ABNORMAL HIGH (ref 80.0–100.0)
Monocytes Absolute: 0.4 10*3/uL (ref 0.1–1.0)
Monocytes Relative: 7 %
Neutro Abs: 3.6 10*3/uL (ref 1.7–7.7)
Neutrophils Relative %: 69 %
Platelets: 126 10*3/uL — ABNORMAL LOW (ref 150–400)
RBC: 3.03 MIL/uL — ABNORMAL LOW (ref 4.22–5.81)
RDW: 13.8 % (ref 11.5–15.5)
WBC: 5.3 10*3/uL (ref 4.0–10.5)
nRBC: 0 % (ref 0.0–0.2)

## 2022-11-27 NOTE — Telephone Encounter (Signed)
Belinda from [facility] left vm stating that pt hadn't received the prep he needed to drink for his colonoscopy on Friday. Wanted to know if it was sent in. Says if she wasn't there to ask to speak with Lorelee Market, supervisor.  Called [place], [place] had already left, transferred to Lorelee Market, Merchandiser, retail. Got her vm. Left vm that prep was sent in on 11/06/22 to [facility]. If she had any questions to call back.

## 2022-11-29 ENCOUNTER — Encounter (HOSPITAL_COMMUNITY): Payer: Self-pay

## 2022-11-29 ENCOUNTER — Ambulatory Visit (HOSPITAL_COMMUNITY)
Admission: RE | Admit: 2022-11-29 | Discharge: 2022-11-29 | Disposition: A | Discharge status: Skilled Nursing Facility | Payer: Medicare Other | Attending: Internal Medicine | Admitting: Internal Medicine

## 2022-11-29 ENCOUNTER — Ambulatory Visit (HOSPITAL_COMMUNITY): Payer: Medicare Other | Admitting: Anesthesiology

## 2022-11-29 ENCOUNTER — Encounter (HOSPITAL_COMMUNITY)
Admission: RE | Disposition: A | Discharge status: Skilled Nursing Facility | Payer: Self-pay | Source: Home / Self Care | Attending: Internal Medicine

## 2022-11-29 DIAGNOSIS — G959 Disease of spinal cord, unspecified: Secondary | ICD-10-CM | POA: Diagnosis not present

## 2022-11-29 DIAGNOSIS — Z538 Procedure and treatment not carried out for other reasons: Secondary | ICD-10-CM

## 2022-11-29 DIAGNOSIS — F17210 Nicotine dependence, cigarettes, uncomplicated: Secondary | ICD-10-CM | POA: Diagnosis not present

## 2022-11-29 DIAGNOSIS — I5032 Chronic diastolic (congestive) heart failure: Secondary | ICD-10-CM | POA: Diagnosis not present

## 2022-11-29 DIAGNOSIS — K219 Gastro-esophageal reflux disease without esophagitis: Secondary | ICD-10-CM | POA: Diagnosis not present

## 2022-11-29 DIAGNOSIS — J449 Chronic obstructive pulmonary disease, unspecified: Secondary | ICD-10-CM | POA: Diagnosis not present

## 2022-11-29 DIAGNOSIS — I110 Hypertensive heart disease with heart failure: Secondary | ICD-10-CM | POA: Insufficient documentation

## 2022-11-29 DIAGNOSIS — N289 Disorder of kidney and ureter, unspecified: Secondary | ICD-10-CM | POA: Diagnosis not present

## 2022-11-29 DIAGNOSIS — D649 Anemia, unspecified: Secondary | ICD-10-CM | POA: Insufficient documentation

## 2022-11-29 DIAGNOSIS — K5730 Diverticulosis of large intestine without perforation or abscess without bleeding: Secondary | ICD-10-CM

## 2022-11-29 DIAGNOSIS — M1990 Unspecified osteoarthritis, unspecified site: Secondary | ICD-10-CM | POA: Diagnosis not present

## 2022-11-29 DIAGNOSIS — I509 Heart failure, unspecified: Secondary | ICD-10-CM | POA: Diagnosis not present

## 2022-11-29 DIAGNOSIS — K648 Other hemorrhoids: Secondary | ICD-10-CM

## 2022-11-29 DIAGNOSIS — E039 Hypothyroidism, unspecified: Secondary | ICD-10-CM | POA: Diagnosis not present

## 2022-11-29 DIAGNOSIS — G709 Myoneural disorder, unspecified: Secondary | ICD-10-CM | POA: Diagnosis not present

## 2022-11-29 DIAGNOSIS — D509 Iron deficiency anemia, unspecified: Secondary | ICD-10-CM

## 2022-11-29 DIAGNOSIS — Z79890 Hormone replacement therapy (postmenopausal): Secondary | ICD-10-CM | POA: Insufficient documentation

## 2022-11-29 HISTORY — PX: COLONOSCOPY WITH PROPOFOL: SHX5780

## 2022-11-29 SURGERY — COLONOSCOPY WITH PROPOFOL
Anesthesia: General

## 2022-11-29 MED ORDER — PROPOFOL 10 MG/ML IV BOLUS
INTRAVENOUS | Status: DC | PRN
  Administered 2022-11-29: 20 mg via INTRAVENOUS
  Administered 2022-11-29: 50 mg via INTRAVENOUS

## 2022-11-29 MED ORDER — PHENYLEPHRINE HCL (PRESSORS) 10 MG/ML IV SOLN
INTRAVENOUS | Status: DC | PRN
  Administered 2022-11-29: 80 ug via INTRAVENOUS
  Administered 2022-11-29 (×2): 160 ug via INTRAVENOUS

## 2022-11-29 MED ORDER — LACTATED RINGERS IV SOLN: INTRAVENOUS | Status: DC

## 2022-11-29 MED ORDER — LIDOCAINE HCL (CARDIAC) PF 100 MG/5ML IV SOSY
PREFILLED_SYRINGE | INTRAVENOUS | Status: DC | PRN
Start: 2022-11-29 — End: 2022-11-29
  Administered 2022-11-29: 60 mg via INTRAVENOUS

## 2022-11-29 MED ORDER — LACTATED RINGERS IV SOLN: INTRAVENOUS | Status: DC | PRN

## 2022-11-29 MED ORDER — STERILE WATER FOR IRRIGATION IR SOLN
Status: DC | PRN
  Administered 2022-11-29: 100 mL

## 2022-11-29 NOTE — Op Note (Signed)
[facility] Patient Name: [name] Procedure Date: 11/29/2022 1:20 PM MRN: [id number] Date of Birth: 1945/05/21 Attending MD: Hennie Duos. Maple Mirza, [id number] CSN: [id number] Age: 77 Admit Type: Outpatient Procedure:                Colonoscopy Indications:              Iron deficiency anemia Providers:                Hennie Duos. Marletta Lor, DO, Angelica Ran, Yolanda Bonine Referring MD:              Medicines:                See the Anesthesia note for documentation of the                            administered medications Complications:            No immediate complications. Estimated Blood Loss:     Estimated blood loss: none. Procedure:                Pre-Anesthesia Assessment:                           - The anesthesia plan was to use monitored                            anesthesia care (MAC).                           After obtaining informed consent, the colonoscope                            was passed under direct vision. Throughout the                            procedure, the patient's blood pressure, pulse, and                            oxygen saturations were monitored continuously. The                            [id number]-HQ190L ([id number]) scope was introduced through                            the anus and advanced to the the cecum, identified                            by appendiceal orifice and ileocecal valve. The                            colonoscopy was performed without difficulty. The                            patient tolerated the procedure well. The quality  of the bowel preparation was evaluated using the                            BBPS [facility] Bowel Preparation Scale) with scores                            of: Right Colon = 1 (portion of mucosa seen, but                            other areas not well seen due to staining, residual                            stool and/or opaque liquid),  Transverse Colon = 1                            (portion of mucosa seen, but other areas not well                            seen due to staining, residual stool and/or opaque                            liquid) and Left Colon = 2 (minor amount of                            residual staining, small fragments of stool and/or                            opaque liquid, but mucosa seen well). The total                            BBPS score equals 4. The quality of the bowel                            preparation was inadequate. Scope In: 2:57:54 PM Scope Out: 3:06:51 PM Scope Withdrawal Time: 0 hours 4 minutes 25 seconds  Total Procedure Duration: 0 hours 8 minutes 57 seconds  Findings:      Non-bleeding internal hemorrhoids were found during endoscopy.      Multiple large-mouthed and small-mouthed diverticula were found in the       sigmoid colon and descending colon.      A large amount of semi-solid stool and food debris was found in the       entire colon, precluding visualization. Lavage of the area was performed       using copious amounts of sterile water, resulting in incomplete       clearance with continued poor visualization. Impression:               - Preparation of the colon was inadequate.                           - Non-bleeding internal hemorrhoids.                           - Diverticulosis in  the sigmoid colon and in the                            descending colon.                           - Stool in the entire examined colon.                           - No specimens collected. Moderate Sedation:      Per Anesthesia Care Recommendation:           - Patient has a contact number available for                            emergencies. The signs and symptoms of potential                            delayed complications were discussed with the                            patient. Return to normal activities tomorrow.                            Written discharge instructions were  provided to the                            patient.                           - Resume previous diet.                           - Continue present medications.                           - Repeat colonoscopy in 3-6 months because the                            bowel preparation was poor. Patient states he ate                            breakfast yeserday despite knowing he was not                            supposed to                           - Return to GI clinic in 2 months. Procedure Code(s):        --- Professional ---                           [phone], Colonoscopy, flexible; diagnostic, including                            collection of specimen(s) by brushing or washing,  when performed (separate procedure) Diagnosis Code(s):        --- Professional ---                           K64.8, Other hemorrhoids                           D50.9, Iron deficiency anemia, unspecified                           K57.30, Diverticulosis of large intestine without                            perforation or abscess without bleeding CPT copyright 2022 American Medical Association. All rights reserved. The codes documented in this report are preliminary and upon coder review may  be revised to meet current compliance requirements. Hennie Duos. Marletta Lor, DO Hennie Duos. Marletta Lor, DO 11/29/2022 3:11:03 PM This report has been signed electronically. Number of Addenda: 0

## 2022-11-29 NOTE — Anesthesia Preprocedure Evaluation (Signed)
Anesthesia Evaluation  Patient identified by MRN, date of birth, ID band Patient awake    Reviewed: Allergy & Precautions, H&P , NPO status , Patient's Chart, lab work & pertinent test results  Airway Mallampati: II  TM Distance: >3 FB Neck ROM: Full    Dental  (+) Edentulous Upper, Edentulous Lower   Pulmonary COPD, Current Smoker and Patient abstained from smoking.   Pulmonary exam normal breath sounds clear to auscultation       Cardiovascular Exercise Tolerance: Poor METS (myelopathy, unable to ambulate): hypertension, Pt. on medications +CHF  Normal cardiovascular exam Rhythm:Regular Rate:Normal     Neuro/Psych  Neuromuscular disease  negative psych ROS   GI/Hepatic Neg liver ROS,GERD  Medicated and Controlled,,  Endo/Other  Hypothyroidism    Renal/GU Renal InsufficiencyRenal disease  negative genitourinary   Musculoskeletal  (+) Arthritis , Osteoarthritis,    Abdominal   Peds negative pediatric ROS (+)  Hematology  (+) Blood dyscrasia, anemia   Anesthesia Other Findings   Reproductive/Obstetrics negative OB ROS                             Anesthesia Physical Anesthesia Plan  ASA: 3  Anesthesia Plan: General   Post-op Pain Management: Minimal or no pain anticipated   Induction: Intravenous  PONV Risk Score and Plan: 1  Airway Management Planned: Nasal Cannula and Natural Airway  Additional Equipment:   Intra-op Plan:   Post-operative Plan:   Informed Consent: I have reviewed the patients History and Physical, chart, labs and discussed the procedure including the risks, benefits and alternatives for the proposed anesthesia with the patient or authorized representative who has indicated his/her understanding and acceptance.       Plan Discussed with: CRNA and Surgeon  Anesthesia Plan Comments:         Anesthesia Quick Evaluation

## 2022-11-29 NOTE — Discharge Instructions (Signed)
  Colonoscopy Discharge Instructions  Read the instructions outlined below and refer to this sheet in the next few weeks. These discharge instructions provide you with general information on caring for yourself after you leave the hospital. Your doctor may also give you specific instructions. While your treatment has been planned according to the most current medical practices available, unavoidable complications occasionally occur.   ACTIVITY You may resume your regular activity, but move at a slower pace for the next 24 hours.  Take frequent rest periods for the next 24 hours.  Walking will help get rid of the air and reduce the bloated feeling in your belly (abdomen).  No driving for 24 hours (because of the medicine (anesthesia) used during the test).   Do not sign any important legal documents or operate any machinery for 24 hours (because of the anesthesia used during the test).  NUTRITION Drink plenty of fluids.  You may resume your normal diet as instructed by your doctor.  Begin with a light meal and progress to your normal diet. Heavy or fried foods are harder to digest and may make you feel sick to your stomach (nauseated).  Avoid alcoholic beverages for 24 hours or as instructed.  MEDICATIONS You may resume your normal medications unless your doctor tells you otherwise.  WHAT YOU CAN EXPECT TODAY Some feelings of bloating in the abdomen.  Passage of more gas than usual.  Spotting of blood in your stool or on the toilet paper.  IF YOU HAD POLYPS REMOVED DURING THE COLONOSCOPY: No aspirin products for 7 days or as instructed.  No alcohol for 7 days or as instructed.  Eat a soft diet for the next 24 hours.  FINDING OUT THE RESULTS OF YOUR TEST Not all test results are available during your visit. If your test results are not back during the visit, make an appointment with your caregiver to find out the results. Do not assume everything is normal if you have not heard from your  caregiver or the medical facility. It is important for you to follow up on all of your test results.  SEEK IMMEDIATE MEDICAL ATTENTION IF: You have more than a spotting of blood in your stool.  Your belly is swollen (abdominal distention).  You are nauseated or vomiting.  You have a temperature over 101.  You have abdominal pain or discomfort that is severe or gets worse throughout the day.   Unfortunately, your colon was not adequately prepped today for colonoscopy.  I did not see any evidence of colon cancer or large polyps, but certainly could have missed smaller polyps due to poor visualization.  Would recommend repeat colonoscopy in 6 months with a different colon prep.   I hope you have a great rest of your week!  Charles K. Carver, D.O. Gastroenterology and Hepatology [facility]  

## 2022-11-29 NOTE — H&P (Signed)
Primary Care Physician:  Gilmore Laroche, FNP Primary Gastroenterologist:  Dr. Marletta Lor  Pre-Procedure History & Physical: HPI:  Andrew STAMATIS is a 77 y.o. 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   Nausea vomiting and diarrhea 09/14/2022   Restless leg syndrome     Past Surgical History:  Procedure Laterality Date   BACK SURGERY     Fusion 2005   BIOPSY  09/18/2022   Procedure: BIOPSY;  Surgeon: Napoleon Form, MD;  Location: [facility];  Service: Gastroenterology;;   ESOPHAGOGASTRODUODENOSCOPY (EGD) WITH PROPOFOL N/A 09/18/2022   Procedure: ESOPHAGOGASTRODUODENOSCOPY (EGD) WITH PROPOFOL;  Surgeon: Napoleon Form, MD;  Location: [facility];  Service: Gastroenterology;  Laterality: N/A;   Graves Disease     JOINT REPLACEMENT  1970   Right knee    LAMINECTOMY WITH POSTERIOR LATERAL ARTHRODESIS LEVEL 2 N/A 06/29/2021   Procedure: Laminectomy - Lumbar one-two, Lumbar two-three - redo with posterior lateral fusion with pedicle screws;  Surgeon: Julio Sicks, MD;  Location: [facility];  Service: Neurosurgery;  Laterality: N/A;   NECK SURGERY     POSTERIOR CERVICAL FUSION/FORAMINOTOMY  12/05/2011   Procedure: POSTERIOR CERVICAL FUSION/FORAMINOTOMY LEVEL 4;  Surgeon: Reinaldo Meeker, MD;  Location: [facility];  Service: Neurosurgery;  Laterality: N/A;  Posterior Cervical fixation Cervical four-Seven,Cervical Decompresson Cervical three   THORACIC DISCECTOMY N/A 01/08/2021   Procedure: Thoracic One-Two, Thoracic Two-Three, Thoracic Eleven-Twelve, Thoracic Twelve-Lumbar One Laminectomy;  Surgeon: Julio Sicks, MD;  Location: [facility];  Service: Neurosurgery;  Laterality: N/A;     Prior to Admission medications   Medication Sig Start Date End Date Taking? Authorizing Provider  aspirin 81 MG chewable tablet Chew 1 tablet (81 mg total) by mouth daily. 09/04/21  Yes Gilmore Laroche, FNP  Cholecalciferol (VITAMIN D3) 1.25 MG (50000 UT) TABS Take 50,000 Units by mouth every Wednesday.   Yes [provider]  DULoxetine HCl 60 MG CSDR Take 1 capsule by mouth daily.   Yes [provider]  ferrous sulfate 325 (65 FE) MG tablet Take 325 mg by mouth daily with breakfast.   Yes [provider]  levothyroxine (SYNTHROID) 150 MCG tablet TAKE 1 TABLET BY MOUTH DAILY BEFORE BREAKFAST 02/04/22  Yes Gilmore Laroche, FNP  meloxicam (MOBIC) 7.5 MG tablet Take 1 tablet (7.5 mg total) by mouth daily. 10/03/22  Yes Sater, Pearletha Furl, MD  pantoprazole (PROTONIX) 40 MG tablet Take 1 tablet (40 mg total) by mouth 2 (two) times daily. 09/20/22  Yes Burnadette Pop, MD  pregabalin (LYRICA) 100 MG capsule Take 1 capsule (100 mg total) by mouth 2 (two) times daily. 10/03/22  Yes Sater, Pearletha Furl, MD  simvastatin (ZOCOR) 20 MG tablet TAKE 1 TABLET BY MOUTH AT BEDTIME 07/02/22  Yes Gilmore Laroche, FNP  acetaminophen (TYLENOL) 325 MG tablet Take 650 mg by mouth 3 (three) times daily.    [provider]  ascorbic acid (VITAMIN C) 500 MG tablet Take 500 mg by mouth daily.    [provider]  Magnesium Oxide 400 MG CAPS Take 2 capsules (800 mg total) by mouth daily. 09/20/22  Burnadette Pop, MD  Multiple Vitamins-Minerals (PRESERVISION AREDS 2 PO) Take 1 capsule by mouth daily.    [provider]  senna (SENOKOT) 8.6 MG TABS tablet Take 1 tablet (8.6 mg total) by mouth daily. 09/20/22   Burnadette Pop, MD  sucralfate (CARAFATE) 1 g tablet Take 1 g by mouth 4 (four) times daily -  with meals and at bedtime.    [provider]  tamsulosin (FLOMAX) 0.4 MG CAPS capsule Take 1 capsule (0.4 mg total) by mouth 2 (two) times daily. 09/20/22   Burnadette Pop,  MD  trimethoprim (TRIMPEX) 100 MG tablet Take 1 tablet (100 mg total) by mouth at bedtime. 09/23/22   Burnadette Pop, MD  vitamin B-12 (CYANOCOBALAMIN) 1000 MCG tablet Take 1,000 mcg by mouth daily.    [provider]    Allergies as of 11/06/2022 - Review Complete 10/16/2022  Allergen Reaction Noted   Sulfa antibiotics Other (See Comments) 11/01/2011   Hydrocodone  10/16/2022    Family History  Problem Relation Age of Onset   Heart attack Mother 31   Heart disease Father     Social History   Socioeconomic History   Marital status: Married    Spouse name: Keane Scrape. Knoff   Number of children: 1   Years of education: 12   Highest education level: Not on file  Occupational History   Not on file  Tobacco Use   Smoking status: Some Days    Current packs/day: 0.25    Average packs/day: 0.3 packs/day for 50.0 years (12.5 ttl pk-yrs)    Types: Cigarettes   Smokeless tobacco: Never  Vaping Use   Vaping status: Never Used  Substance and Sexual Activity   Alcohol use: No   Drug use: Yes   Sexual activity: Not on file  Other Topics Concern   Not on file  Social History Narrative   Right handed   Lives at SNF   No caffeine use   Social Determinants of Health   Financial Resource Strain: Low Risk  (09/18/2021)   Overall Financial Resource Strain (CARDIA)    Difficulty of Paying Living Expenses: Not hard at all  Food Insecurity: No Food Insecurity (09/18/2021)   Hunger Vital Sign    Worried About Running Out of Food in the Last Year: Never true    Ran Out of Food in the Last Year: Never true  Transportation Needs: No Transportation Needs (09/18/2021)   PRAPARE - Administrator, Civil Service (Medical): No    Lack of Transportation (Non-Medical): No  Physical Activity: Insufficiently Active (09/18/2021)   Exercise Vital Sign    Days of Exercise per Week: 5 days    Minutes of Exercise per Session: 20 min  Stress: No Stress Concern Present (09/18/2021)    [employer] of Occupational Health - Occupational Stress Questionnaire    Feeling of Stress : Not at all  Social Connections: Moderately Integrated (09/18/2021)   Social Connection and Isolation Panel [NHANES]    Frequency of Communication with Friends and Family: More than three times a week    Frequency of Social Gatherings with Friends and Family: More than three times a week    Attends Religious Services: More than 4 times per year    Active Member of [employer] or Organizations: No    Attends Banker Meetings: Never    Marital Status: Married  Catering manager Violence: Not At Risk (09/18/2021)   Humiliation, Afraid, Rape, and Kick questionnaire  Fear of Current or Ex-Partner: No    Emotionally Abused: No    Physically Abused: No    Sexually Abused: No    Review of Systems: See HPI, otherwise negative ROS  Physical Exam: Vital signs in last 24 hours: Temp:  [98 F (36.7 C)] 98 F (36.7 C) (08/16 1131) Pulse Rate:  [94] 94 (08/16 1131) Resp:  [16] 16 (08/16 1131) BP: (136)/(90) 136/90 (08/16 1131) SpO2:  [100 %] 100 % (08/16 1131) Weight:  [99.8 kg] 99.8 kg (08/16 1131)   General:   Alert,  Well-developed, well-nourished, pleasant and cooperative in NAD Head:  Normocephalic and atraumatic. Eyes:  Sclera clear, no icterus.   Conjunctiva pink. Ears:  Normal auditory acuity. Nose:  No deformity, discharge,  or lesions. Msk:  Symmetrical without gross deformities. Normal posture. Extremities:  Without clubbing or edema. Neurologic:  Alert and  oriented x4;  grossly normal neurologically. Skin:  Intact without significant lesions or rashes. Psych:  Alert and cooperative. Normal mood and affect.  Impression/Plan: [name] is here for a colonoscopy to be performed for anemia  The risks of the procedure including infection, bleed, or perforation as well as benefits, limitations, alternatives and imponderables have been reviewed with the patient.  Questions have been answered. All parties agreeable.

## 2022-11-29 NOTE — Anesthesia Postprocedure Evaluation (Signed)
Anesthesia Post Note  Patient: [name]  Procedure(s) Performed: COLONOSCOPY WITH PROPOFOL  Patient location during evaluation: PACU Anesthesia Type: General Level of consciousness: awake and alert and oriented Pain management: pain level controlled Vital Signs Assessment: post-procedure vital signs reviewed and stable Respiratory status: spontaneous breathing, nonlabored ventilation and respiratory function stable Cardiovascular status: blood pressure returned to baseline and stable Postop Assessment: no apparent nausea or vomiting Anesthetic complications: no  No notable events documented.   Last Vitals:  Vitals:   11/29/22 1131 11/29/22 1513  BP: (!) 136/90 103/66  Pulse: 94 (!) 104  Resp: 16 19  Temp: 36.7 C 36.6 C  SpO2: 100% 96%    Last Pain:  Vitals:   11/29/22 1513  TempSrc: Oral  PainSc: 0-No pain                 Yianna Tersigni C Annamary Buschman

## 2022-11-29 NOTE — Transfer of Care (Signed)
Immediate Anesthesia Transfer of Care Note  Patient: [name]  Procedure(s) Performed: COLONOSCOPY WITH PROPOFOL  Patient Location: PACU  Anesthesia Type:General  Level of Consciousness: awake, alert , oriented, and patient cooperative  Airway & Oxygen Therapy: Patient Spontanous Breathing and Patient connected to nasal cannula oxygen  Post-op Assessment: Report given to RN, Post -op Vital signs reviewed and stable, and Patient moving all extremities X 4  Post vital signs: Reviewed and stable  Last Vitals:  Vitals Value Taken Time  BP    Temp    Pulse 104 11/29/22 1513  Resp    SpO2     Pt awake, comfy, and talking about great grandchild.  Last Pain:  Vitals:   11/29/22 1513  TempSrc: Oral  PainSc:          Complications: No notable events documented.

## 2022-12-03 ENCOUNTER — Encounter (HOSPITAL_COMMUNITY): Payer: Self-pay | Admitting: Internal Medicine

## 2022-12-04 ENCOUNTER — Ambulatory Visit: Payer: 59

## 2023-01-23 ENCOUNTER — Encounter: Payer: Self-pay | Admitting: Internal Medicine

## 2023-02-24 ENCOUNTER — Ambulatory Visit (INDEPENDENT_AMBULATORY_CARE_PROVIDER_SITE_OTHER): Payer: Medicare Other | Admitting: Gastroenterology

## 2023-02-24 ENCOUNTER — Encounter: Payer: Self-pay | Admitting: Gastroenterology

## 2023-02-24 VITALS — BP 99/67 | HR 97 | Temp 97.6°F | Ht 79.0 in | Wt 220.0 lb

## 2023-02-24 DIAGNOSIS — D649 Anemia, unspecified: Secondary | ICD-10-CM | POA: Diagnosis not present

## 2023-02-24 DIAGNOSIS — K5900 Constipation, unspecified: Secondary | ICD-10-CM | POA: Insufficient documentation

## 2023-02-24 DIAGNOSIS — R195 Other fecal abnormalities: Secondary | ICD-10-CM

## 2023-02-24 MED ORDER — POLYETHYLENE GLYCOL 3350 17 G PO PACK: 17.0000 g | PACK | Freq: Every day | ORAL | 5 refills | Status: AC

## 2023-02-24 NOTE — Progress Notes (Signed)
GI Office Note    Referring Provider: Gilmore Laroche, FNP Primary Care Physician:  Gilmore Laroche, FNP  Primary Gastroenterologist: Hennie Duos. Marletta Lor, DO   Chief Complaint   Chief Complaint  Patient presents with   Follow-up    Here for post procedure follow up, states that he has noticed he doesn't burp or have gas anymore wonders why.    History of Present Illness   [name] is a 77 y.o. male presenting today for follow up. Attempted colonoscopy in 11/2022 for acute on chronic anemia with heme positive stool, but prep inadequate.  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  Per op note, patient reported eating breakfast the day before his procedure even though he knew he was not supposed to? He resides at [facility]. Patient does not recall at ths time if that was the case. He states he did all of the laxative correctly but he did not have a BM until the morning of the colonoscopy.     Usually patient passes a little bowel movement when he urinates. But states he has been constipated recently and finally had a BM this morning. He gets senna when needed. He has had some rectal burning. No melena, brbpr. No abdominal pain. No heartburn. Just finished cipro for UTI.    CT abdomen pelvis without contrast September 16, 2022: IMPRESSION: 1. Cholelithiasis. Gallbladder distended but no surrounding inflammation. No biliary dilation. 2. Nonobstructing left renal calculus. 3. 3.5 cm infrarenal abdominal aortic aneurysm. Recommend follow-up every 2 years. 4. Bosniak I benign renal cyst measuring 4.9 cm. No follow-up imaging is recommended. JACR 2018 Feb; 264-273, Management of  the Incidental Renal Mass on CT, [employer] 2021; 814-848, Bosniak Classification of Cystic Renal Masses, Version 2019. Aortic Atherosclerosis (ICD10-I70.0).   CT abdomen pelvis without contrast Sep 13, 2022 at [facility]: 1. A specific cause for the patient's nausea and vomiting is not  identified.  2. Infrarenal abdominal aortic aneurysm 3.6 cm in diameter, stable  by my measurements. Recommend follow-up ultrasound every 2 years.  This recommendation follows ACR consensus guidelines: White Paper of  the ACR Incidental Findings Committee II on Vascular Findings. J Am  Coll Radiol 2013; 10:789-794.  3. Cholelithiasis.  4. Sigmoid colon diverticulosis.  5. Generalized regional muscular atrophy.  6. Multilevel foraminal impingement in the lumbar spine due to  congenitally short pedicles. Multilevel lumbar fusion.  7. Prominent degenerative hip arthropathy bilaterally.  8. Stable bilateral renal lesions, not changed from 08/01/2022 where  these were deemed benign. No further imaging workup of these lesions  is indicated.  9. Aortic and coronary atherosclerosis.    EGD 09/2022:  -normal esophagus -gastritis s/p bx  Colonoscopy 14782: -preparation of colon inadequate, stool in the entire examined colon -nonbleeding internal hemorrhoids -diverticulosis in sigmoid colon and descending colon -repeat colonoscopy in 3-6 months   Medications   Current Outpatient Medications  Medication Sig Dispense Refill   acetaminophen (TYLENOL) 325 MG tablet Take 650 mg by mouth 3 (three) times daily.     aspirin 81 MG chewable tablet Chew 1 tablet (81 mg total) by mouth daily. 90 tablet 0   Cholecalciferol (VITAMIN D3) 1.25 MG (50000 UT) TABS Take 50,000 Units by mouth every Wednesday.     DULoxetine HCl 60  MG CSDR Take 1 capsule by mouth daily.     ferrous sulfate 325 (65 FE) MG tablet Take 325 mg by mouth daily with breakfast.     levothyroxine (SYNTHROID) 150 MCG tablet TAKE 1 TABLET BY  MOUTH DAILY BEFORE BREAKFAST 90 tablet 2   loperamide (IMODIUM A-D) 2 MG tablet Take 2 mg by mouth 4 (four) times daily as needed for diarrhea or loose stools.     Magnesium Oxide 400 MG CAPS Take 2 capsules (800 mg total) by mouth daily.  0   Multiple Vitamins-Minerals (PRESERVISION AREDS 2 PO) Take 1 capsule by mouth daily.     pantoprazole (PROTONIX) 40 MG tablet Take 1 tablet (40 mg total) by mouth 2 (two) times daily. 60 tablet 1   pregabalin (LYRICA) 100 MG capsule Take 1 capsule (100 mg total) by mouth 2 (two) times daily. 60 capsule 5   senna (SENOKOT) 8.6 MG TABS tablet Take 1 tablet (8.6 mg total) by mouth daily. 120 tablet 0   simvastatin (ZOCOR) 20 MG tablet TAKE 1 TABLET BY MOUTH AT BEDTIME 90 tablet 0   sucralfate (CARAFATE) 1 g tablet Take 1 g by mouth 4 (four) times daily -  with meals and at bedtime.     tamsulosin (FLOMAX) 0.4 MG CAPS capsule Take 1 capsule (0.4 mg total) by mouth 2 (two) times daily. 30 capsule    triamcinolone cream (KENALOG) 0.1 % Apply 1 Application topically 2 (two) times daily.     trimethoprim (TRIMPEX) 100 MG tablet Take 1 tablet (100 mg total) by mouth at bedtime. 30 tablet 11   vitamin B-12 (CYANOCOBALAMIN) 1000 MCG tablet Take 1,000 mcg by mouth daily.     No current facility-administered medications for this visit.    Allergies   Allergies as of 02/24/2023 - Review Complete 02/24/2023  Allergen Reaction Noted   Sulfa antibiotics Other (See Comments) 11/01/2011   Hydrocodone  10/16/2022   Misc. sulfonamide containing compounds  02/24/2023     Past Medical History   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   Nausea vomiting and diarrhea 09/14/2022   Restless leg  syndrome     Past Surgical History   Past Surgical History:  Procedure Laterality Date   BACK SURGERY     Fusion 2005   BIOPSY  09/18/2022   Procedure: BIOPSY;  Surgeon: Napoleon Form, MD;  Location: [facility] ENDOSCOPY;  Service: Gastroenterology;;   COLONOSCOPY WITH PROPOFOL N/A 11/29/2022   Procedure: COLONOSCOPY WITH PROPOFOL;  Surgeon: Lanelle Bal, DO;  Location: [facility];  Service: Endoscopy;  Laterality: N/A;  145pm, asa 3, [place] [facility]   ESOPHAGOGASTRODUODENOSCOPY (EGD) WITH PROPOFOL N/A 09/18/2022   Procedure: ESOPHAGOGASTRODUODENOSCOPY (EGD) WITH PROPOFOL;  Surgeon: Napoleon Form, MD;  Location: [facility];  Service: Gastroenterology;  Laterality: N/A;   Graves Disease     JOINT REPLACEMENT  1970   Right knee    LAMINECTOMY WITH POSTERIOR LATERAL ARTHRODESIS LEVEL 2 N/A 06/29/2021   Procedure: Laminectomy - Lumbar one-two, Lumbar two-three - redo with posterior lateral fusion with pedicle screws;  Surgeon: Julio Sicks, MD;  Location: [facility] OR;  Service: Neurosurgery;  Laterality: N/A;   NECK SURGERY     POSTERIOR CERVICAL FUSION/FORAMINOTOMY  12/05/2011   Procedure: POSTERIOR CERVICAL FUSION/FORAMINOTOMY LEVEL 4;  Surgeon: Reinaldo Meeker, MD;  Location: [facility];  Service: Neurosurgery;  Laterality: N/A;  Posterior Cervical fixation Cervical four-Seven,Cervical Decompresson Cervical three   THORACIC DISCECTOMY N/A 01/08/2021   Procedure: Thoracic One-Two, Thoracic Two-Three, Thoracic Eleven-Twelve, Thoracic Twelve-Lumbar One Laminectomy;  Surgeon: Julio Sicks, MD;  Location: [facility] OR;  Service: Neurosurgery;  Laterality: N/A;    Past Family History   Family History  Problem Relation Age of Onset   Heart attack Mother 65   Heart disease Father     Past Social History   Social History   Socioeconomic History   Marital status: Married    Spouse name: Keane Scrape. Navarra   Number of children: 1   Years of education: 12   Highest education level:  Not on file  Occupational History   Not on file  Tobacco Use   Smoking status: Some Days    Current packs/day: 0.25    Average packs/day: 0.3 packs/day for 50.0 years (12.5 ttl pk-yrs)    Types: Cigarettes   Smokeless tobacco: Never  Vaping Use   Vaping status: Never Used  Substance and Sexual Activity   Alcohol use: No   Drug use: Yes   Sexual activity: Not on file  Other Topics Concern   Not on file  Social History Narrative   Right handed   Lives at SNF   No caffeine use   Social Determinants of Health   Financial Resource Strain: Low Risk  (09/18/2021)   Overall Financial Resource Strain (CARDIA)    Difficulty of Paying Living Expenses: Not hard at all  Food Insecurity: No Food Insecurity (09/18/2021)   Hunger Vital Sign    Worried About Running Out of Food in the Last Year: Never true    Ran Out of Food in the Last Year: Never true  Transportation Needs: No Transportation Needs (09/18/2021)   PRAPARE - Administrator, Civil Service (Medical): No    Lack of Transportation (Non-Medical): No  Physical Activity: Insufficiently Active (09/18/2021)   Exercise Vital Sign    Days of Exercise per Week: 5 days    Minutes of Exercise per Session: 20 min  Stress: No Stress Concern Present (09/18/2021)   [employer] of Occupational Health - Occupational Stress Questionnaire    Feeling of Stress : Not at all  Social Connections: Moderately Integrated (09/18/2021)   Social Connection and Isolation Panel [NHANES]    Frequency of Communication with Friends and Family: More than three times a week    Frequency of Social Gatherings with Friends and Family: More than three times a week    Attends Religious Services: More than 4 times per year    Active Member of [employer] or Organizations: No    Attends Banker Meetings: Never    Marital Status: Married  Catering manager Violence: Not At Risk (09/18/2021)   Humiliation, Afraid, Rape, and Kick questionnaire    Fear of  Current or Ex-Partner: No    Emotionally Abused: No    Physically Abused: No    Sexually Abused: No    Review of Systems   General: Negative for anorexia, weight loss, fever, chills, fatigue, weakness. ENT: Negative for hoarseness, difficulty swallowing , nasal congestion. CV: Negative for chest pain, angina, palpitations, dyspnea on exertion, peripheral edema.  Respiratory: Negative for dyspnea at  rest, dyspnea on exertion, cough, sputum, wheezing.  GI: See history of present illness. GU:  Negative for dysuria, hematuria, urinary incontinence, urinary frequency, nocturnal urination.  Endo: Negative for unusual weight change.     Physical Exam   BP 99/67 (BP Location: Left Arm, Patient Position: Sitting, Cuff Size: Large)   Pulse 97   Temp 97.6 F (36.4 C) (Oral)   Ht 6\' 7"  (2.007 m)   Wt 220 lb (99.8 kg) Comment: facility reported 01/22/2023  SpO2 95%   BMI 24.78 kg/m    General: Well-nourished, well-developed in no acute distress.  Eyes: No icterus. Mouth: Oropharyngeal mucosa moist and pink   Lungs: Clear to auscultation bilaterally.  Heart: Regular rate and rhythm, no murmurs rubs or gallops.  Abdomen: Bowel sounds are normal, nontender, nondistended, no hepatosplenomegaly or masses,  no abdominal bruits or hernia , no rebound or guarding.  Rectal: not performed  Extremities: No lower extremity edema. No clubbing or deformities. Neuro: Alert and oriented x 4   Skin: Warm and dry, no jaundice.   Psych: Alert and cooperative, normal mood and affect.  Labs   Lab Results  Component Value Date   NA 141 09/20/2022   CL 104 09/20/2022   K 3.8 09/20/2022   CO2 30 09/20/2022   BUN 15 09/20/2022   CREATININE 1.45 (H) 09/20/2022   GFRNONAA 51 (L) 09/20/2022   CALCIUM 8.4 (L) 09/20/2022   PHOS 4.2 09/15/2022   ALBUMIN 2.8 (L) 09/15/2022   GLUCOSE 90 09/20/2022   Lab Results  Component Value Date   ALT 7 09/15/2022   AST 11 (L) 09/15/2022   ALKPHOS 67 09/15/2022    BILITOT 0.9 09/15/2022   Lab Results  Component Value Date   WBC 5.3 11/26/2022   HGB 10.0 (L) 11/26/2022   HCT 31.4 (L) 11/26/2022   MCV 103.6 (H) 11/26/2022   PLT 126 (L) 11/26/2022   Lab Results  Component Value Date   IRON 112 09/15/2022   TIBC 150 (L) 09/15/2022   FERRITIN 243 09/15/2022   Lab Results  Component Value Date   VITAMINB12 2,290 (H) 09/14/2022   Lab Results  Component Value Date   FOLATE 9.0 09/14/2022    Imaging Studies   No results found.  Assessment/plan:   Acute on chronic anemia/heme positive stool: -EGD up to date -colonoscopy incomplete due to poor bowel prep -update labs -plan for repeat colonoscopy 04/2023. Modified two day bowel prep planned. ASA 3.  I have discussed the risks, alternatives, benefits with regards to but not limited to the risk of reaction to medication, bleeding, infection, perforation and the patient is agreeable to proceed. Written consent to be obtained.  Constipation: -add miralax 17g daily.      Leanna Battles. Melvyn Neth, MHS, PA-C [facility]

## 2023-02-24 NOTE — Patient Instructions (Addendum)
Please complete labs. Start miralax one capful (17 grams) daily. Hold for 24 hours if you have watery stools or more then stool stools a day. We will work towards scheduling a colonoscopy in 04/2023, patient requests waiting until 04/2023.

## 2023-03-17 ENCOUNTER — Telehealth: Payer: Self-pay | Admitting: *Deleted

## 2023-03-17 NOTE — Telephone Encounter (Signed)
Belinda from [facility] called and left vm asking if pt had been scheduled.   Lucretia Field that pt will be scheduled for 04/2023 and once we get providers schedule will call to schedule. Verbalized understanding

## 2023-04-06 IMAGING — CT CT L SPINE W/O CM
3 series · 12 of 33 positions shown, 14 images · non-contrast
Comparison: Multiple exams, including 10/06/2020 MRI

CLINICAL DATA: Back pain and posterior neck pain. Remote injury 30
years ago and a fall 2 years ago.

EXAM:
CT LUMBAR SPINE WITHOUT CONTRAST
TECHNIQUE: Multidetector CT imaging of the lumbar spine was performed without
intravenous contrast administration. Multiplanar CT image
reconstructions were also generated.

[Series 15: l spine soft · axial · 0.47mm/px · z∈[+662,+848]mm · 4 of 135 slices shown, 5 images]
[im 21/135  soft-tissue]
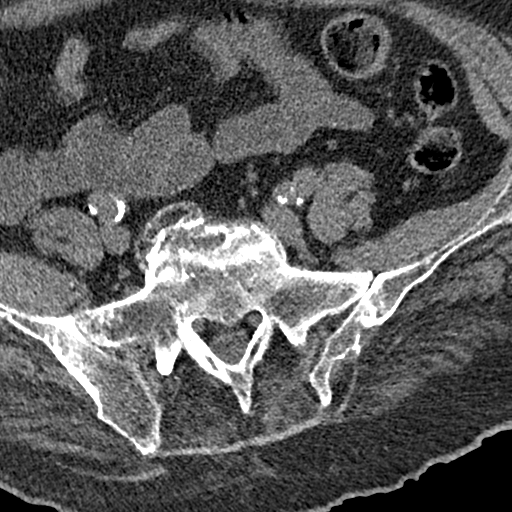
[im 21/135  bone]
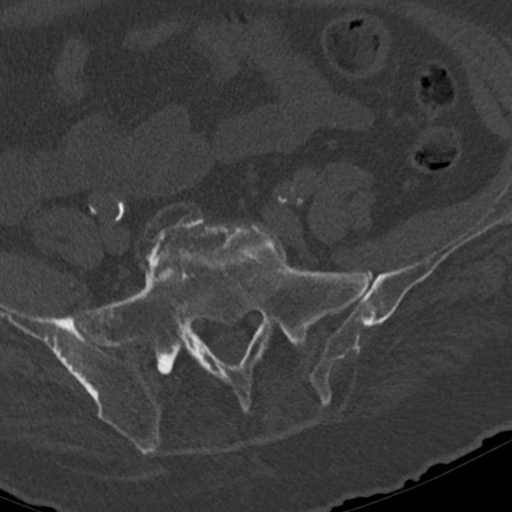
[im 52/135  bone]
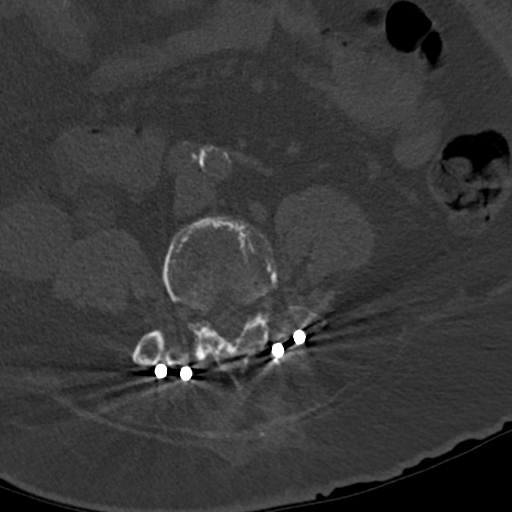
[im 83/135  bone]
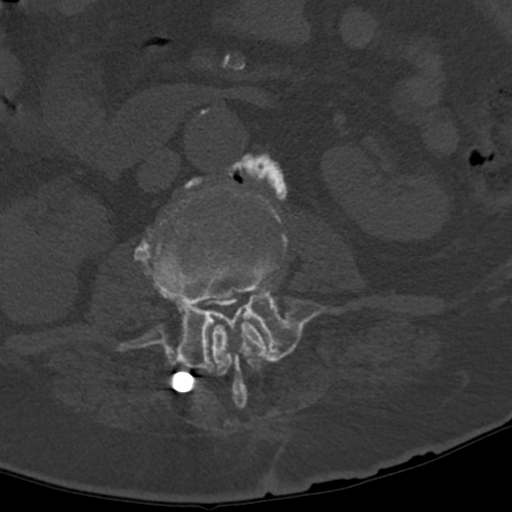
[im 114/135  bone]
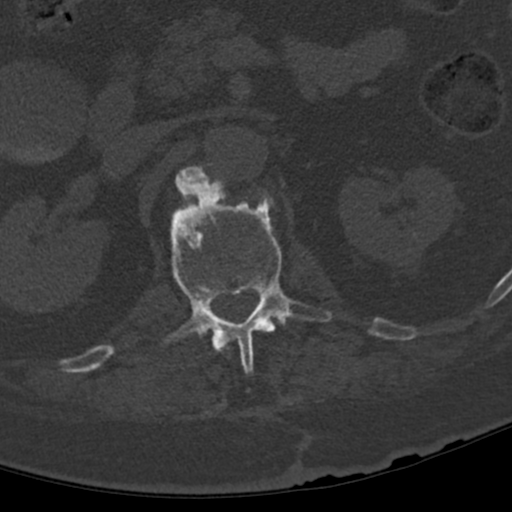

[Series 16: sagittal bone · sagittal · 0.41mm/px · 5 of 74 slices shown, 6 images]
[im 25/74  bone]
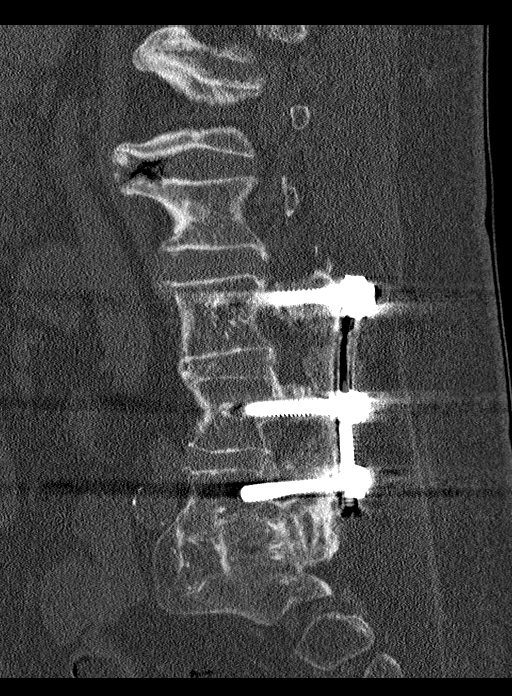
[im 31/74  bone]
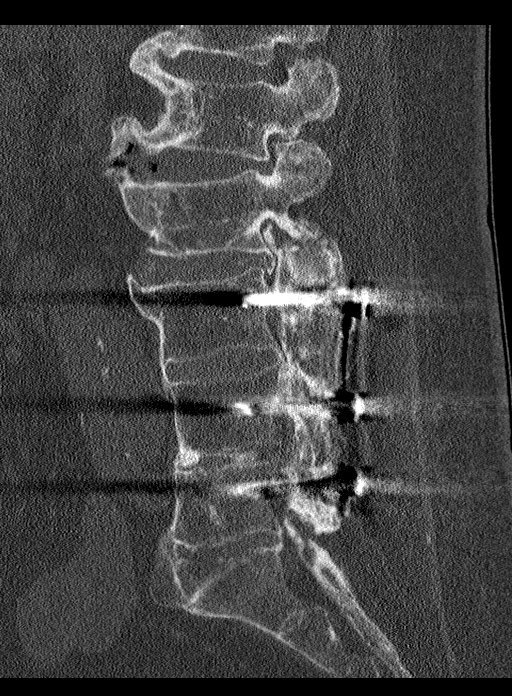
[im 37/74  soft-tissue]
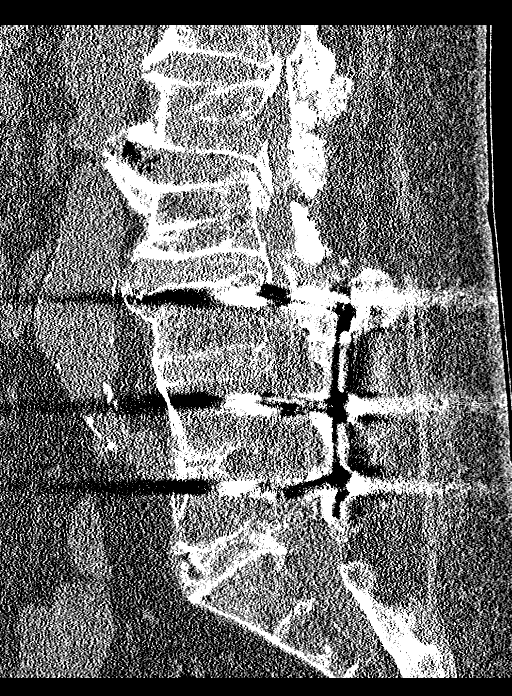
[im 37/74  bone]
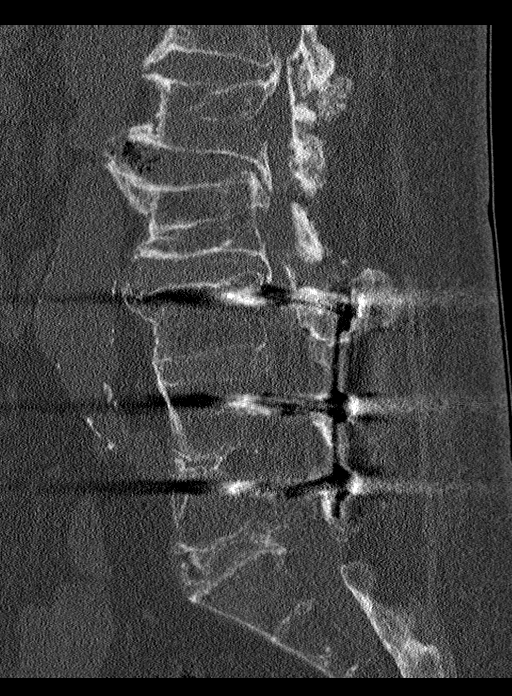
[im 43/74  bone]
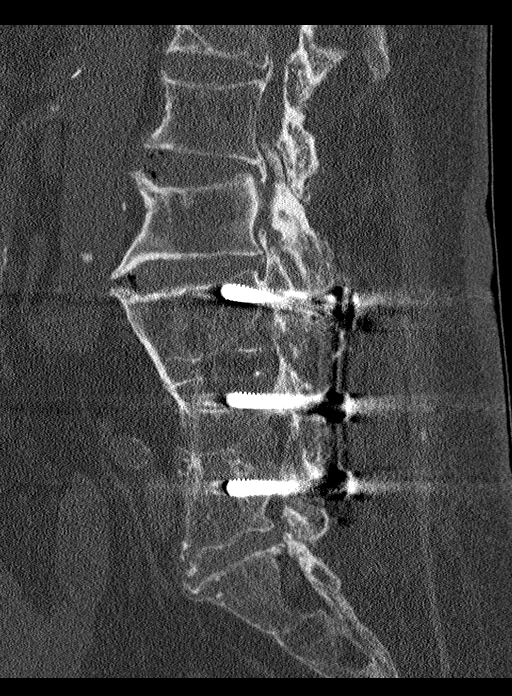
[im 49/74  bone]
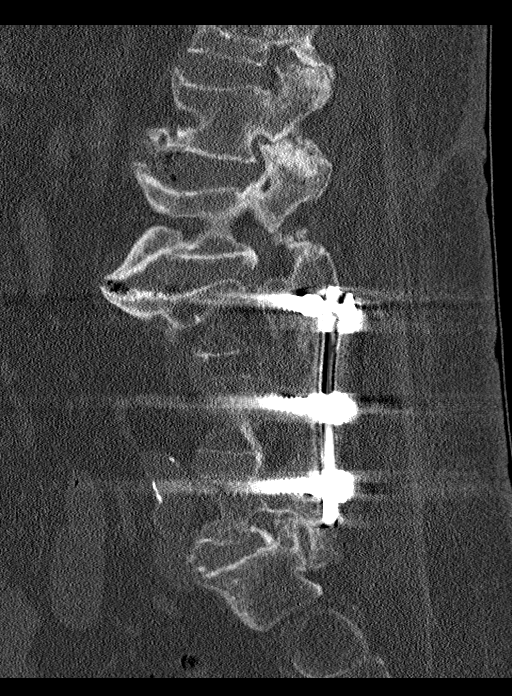

[Series 17: coronal bone · coronal · 0.39mm/px · 3 of 100 slices shown]
[im 20/100  bone]
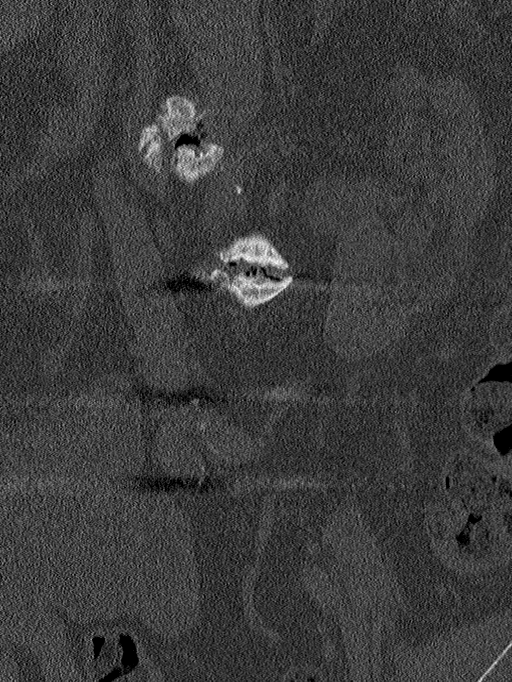
[im 40/100  bone]
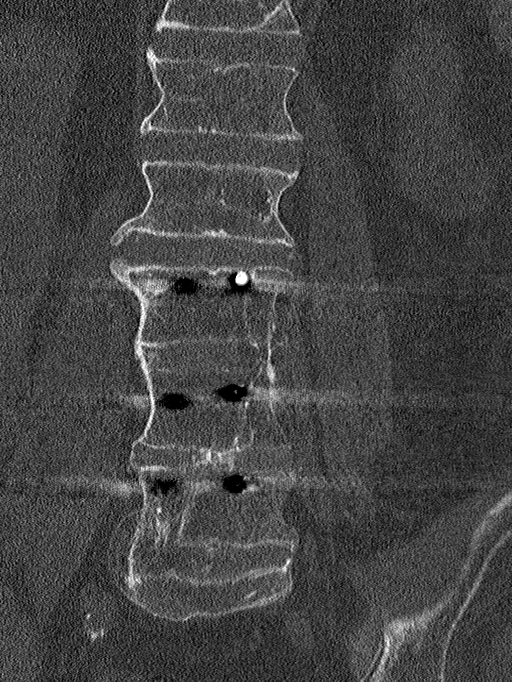
[im 60/100  bone]
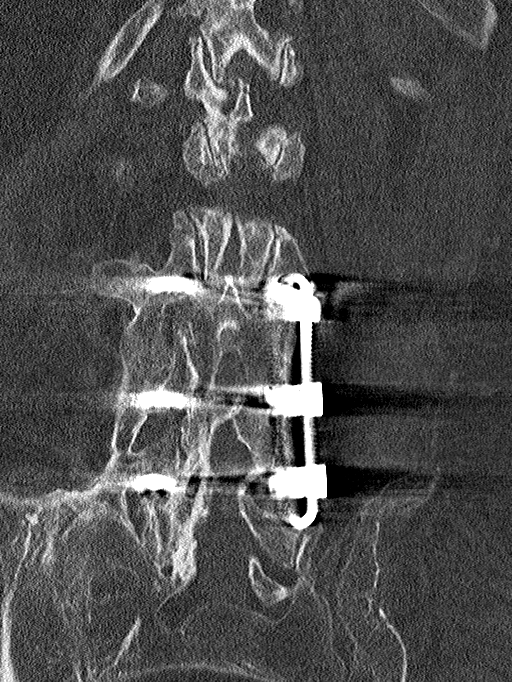

[12 of 33 positions shown; findings below may reference images not displayed]

FINDINGS: Segmentation: The lowest lumbar type non-rib-bearing vertebra is
labeled as L5.

Alignment: 3 mm degenerative retrolisthesis at L1-2.

Vertebrae: Solid interbody fusion at L3-L4-L5 with posterolateral
rod and pedicle screw fixators without complicating feature
identified.

Bridging intervertebral spurring anteriorly at T12-L1 with
pseudoarticulating anterior spurring L1-2. No lumbar spine fracture
is identified.

Paraspinal and other soft tissues: Infrarenal abdominal aortic
aneurysm 3.2 cm in diameter on image 62 series 15.

Dependent small gallstones in the gallbladder.

Hypodense renal lesions are technically nonspecific but may reflect
cysts.

Atherosclerosis is present, including aortoiliac atherosclerotic
disease.

Prior graft harvest site from the left iliac bone.

Disc levels:

T12-L1: Prominent bilateral foraminal stenosis and prominent central
stenosis due to intervertebral and facet spurring. AP diameter of
the thecal sac 0.6 cm. The foraminal impingement is more readily
apparent than on the MRI from 10/06/2020.

L1-2: Prominent bilateral foraminal stenosis and moderate to
prominent central narrowing of the thecal sac due to intervertebral
and facet spurring.

L2-3: Prominent bilateral foraminal stenosis and severe central
narrowing of the thecal sac due to short pedicles, intervertebral
spurring, and facet spurring. Cross-sectional area of the thecal sac
is only about 0.2 cm^2 at this level. This impingement is shown
better today due to the lack of metal distortion that was present on
the prior MRI.

L3-4: Moderate right and mild to moderate left foraminal stenosis
due to facet and intervertebral spurring. Prior laminectomies.

L4-5: Moderate right and mild left foraminal stenosis due to
intervertebral and facet spurring. Prior laminectomies.

L5-S1: Moderate to prominent right and moderate left foraminal
stenosis due to intervertebral and facet spurring.
IMPRESSION: 1. Prior L3-L4-L5 fusion. Congenitally short pedicles and
substantial lumbar spondylosis contribute to prominent impingement
at T12-L1, L1-2, and L2-3; moderate to prominent impingement at
L5-S1; and moderate impingement at L3-4 and L4-5 as detailed above.
The foraminal impingement in the fused region or shown to
substantially better effect than on the prior MRI given the lack of
spatial distortion from the hardware
2. Chronic 3 mm degenerative retrolisthesis at L1-2.
3. Infrarenal abdominal aortic aneurysm 3.2 cm in diameter.
Recommend follow-up ultrasound every 3 years. This recommendation
follows ACR consensus guidelines: White Paper of the ACR Incidental
Findings Committee II on Vascular Findings. [HOSPITAL] 0184;
[DATE].
4.  Aortic Atherosclerosis ([id number]-[id number].L).
5. Cholelithiasis.
6. Hypodense renal lesions are technically nonspecific although
probably cysts.

## 2023-04-06 IMAGING — CT CT CERVICAL SPINE W/O CM
3 of 4 series · 11 of 33 positions shown, 13 images · non-contrast
Comparison: Radiographs 11/09/2020 and CT myelogram from 11/01/2011

CLINICAL DATA: Neck pain.  Prior injuries.

EXAM:
CT CERVICAL SPINE WITHOUT CONTRAST
TECHNIQUE: Multidetector CT imaging of the cervical spine was performed without
intravenous contrast. Multiplanar CT image reconstructions were also
generated.

[Series 7: sag bone · sagittal · 0.36mm/px · 5 of 61 slices shown, 6 images]
[im 21/61  bone]
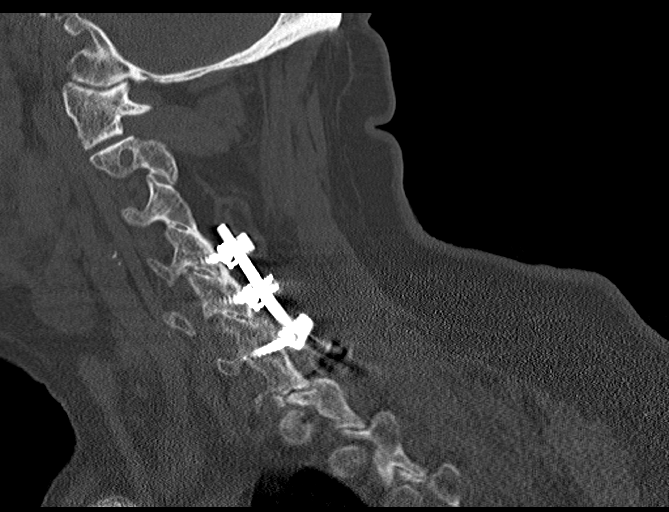
[im 26/61  bone]
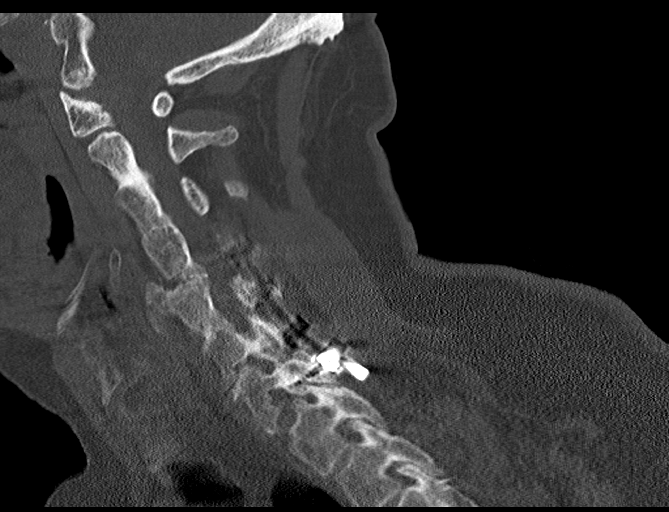
[im 31/61  soft-tissue]
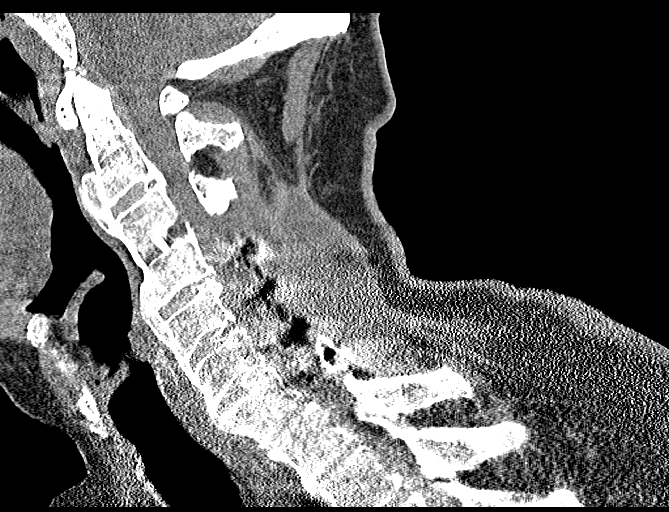
[im 31/61  bone]
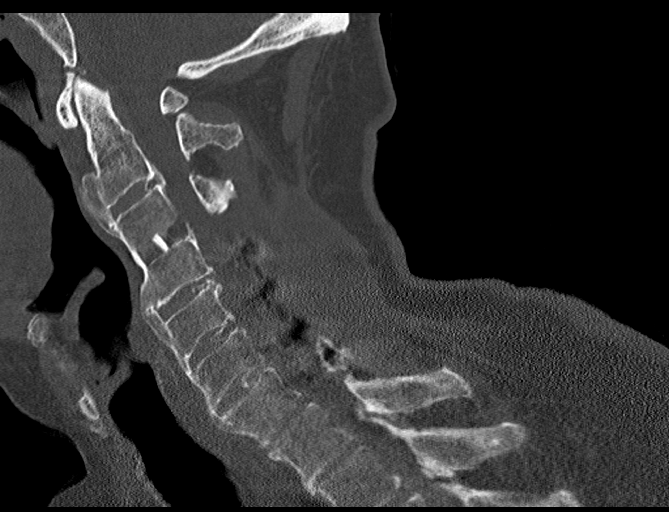
[im 36/61  bone]
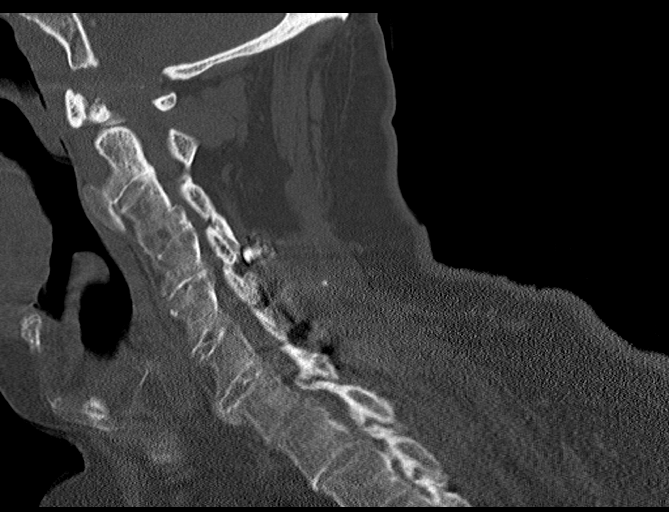
[im 41/61  bone]
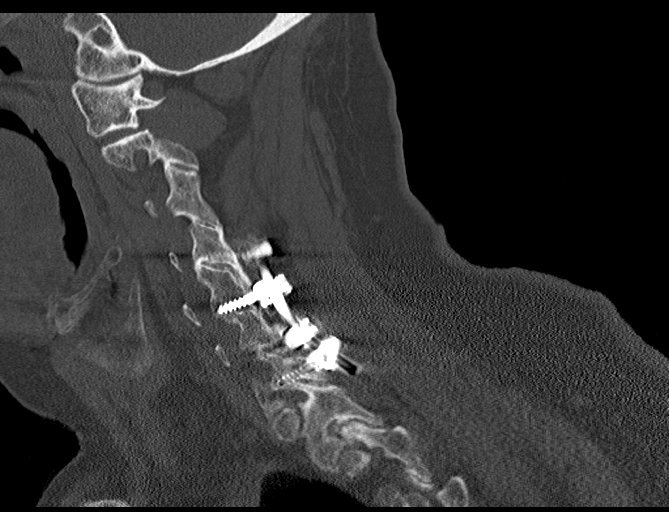

[Series 8: cor bone · coronal · 0.26mm/px · 3 of 69 slices shown]
[im 21/69  bone]
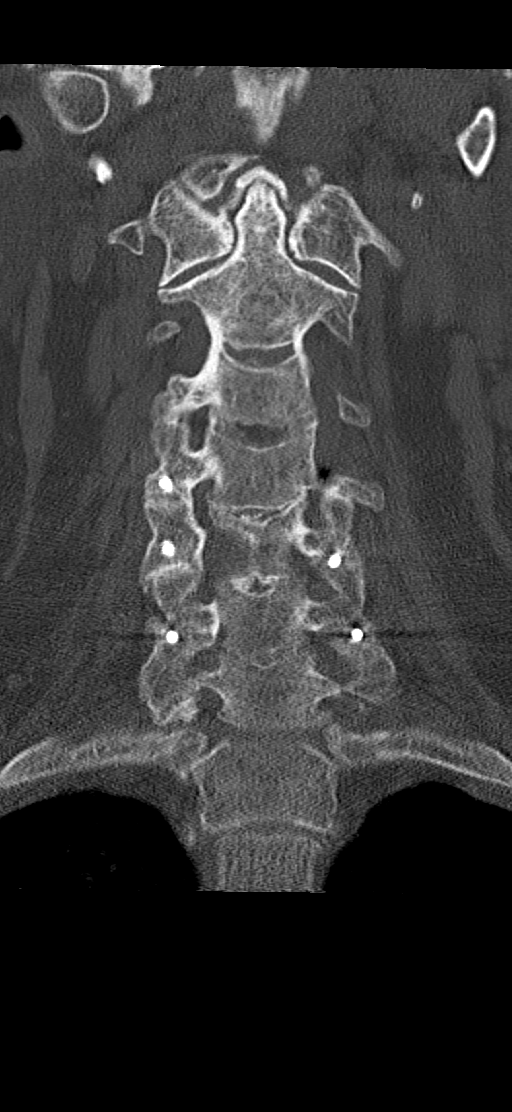
[im 30/69  bone]
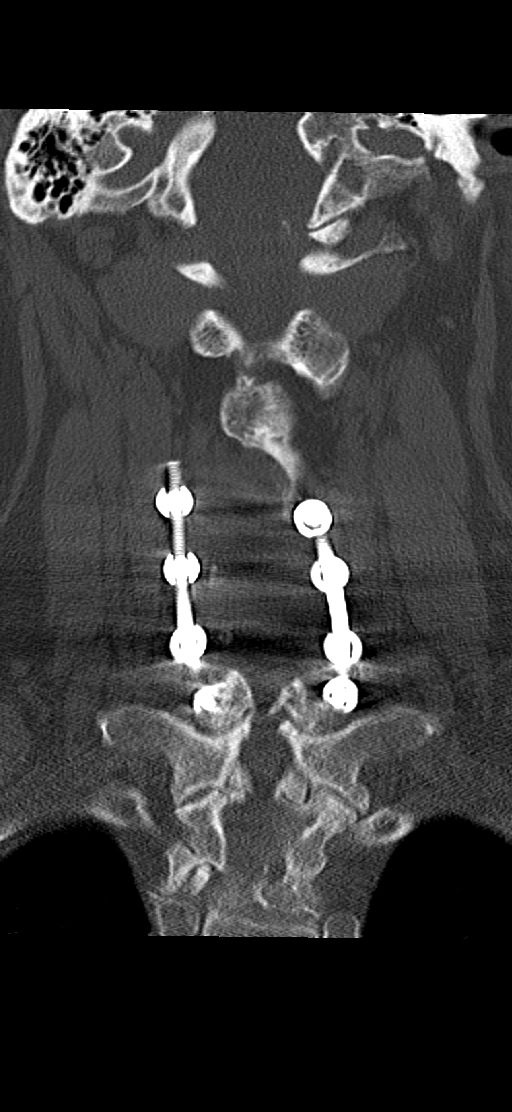
[im 39/69  bone]
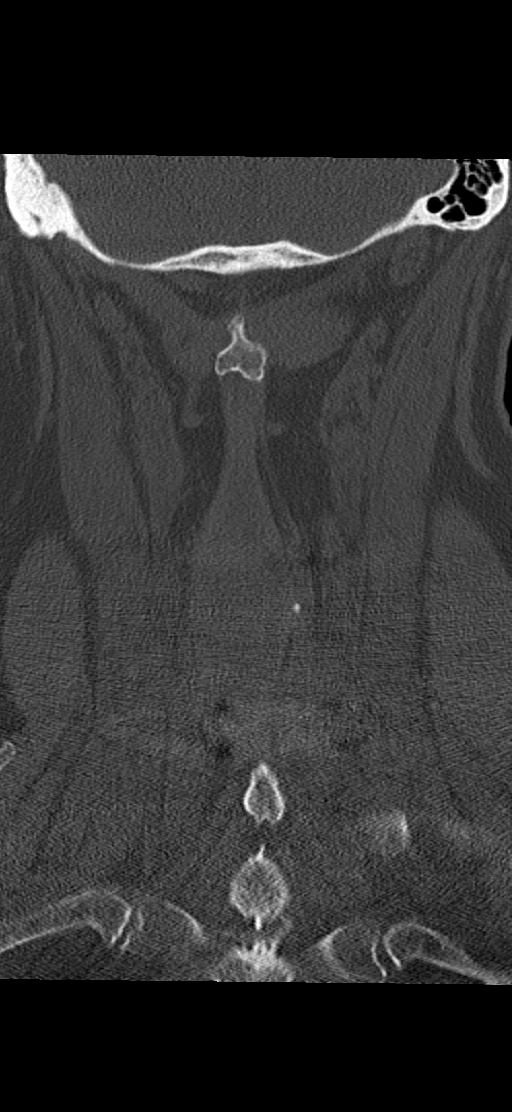

[Series 23: orthogonal axials · axial · 0.21mm/px · z∈[+1166,+1268]mm · 3 of 86 slices shown, 4 images]
[im 15/86  soft-tissue]
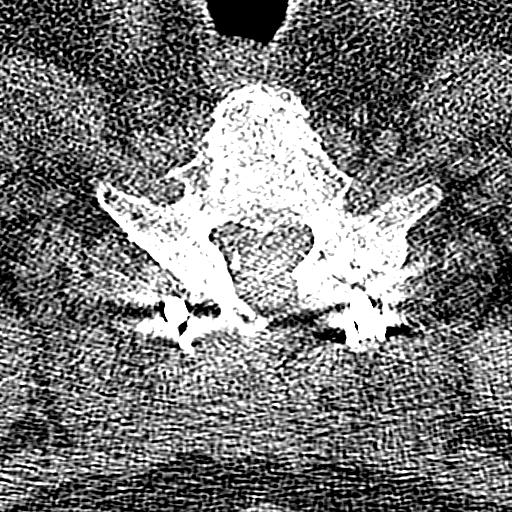
[im 15/86  bone]
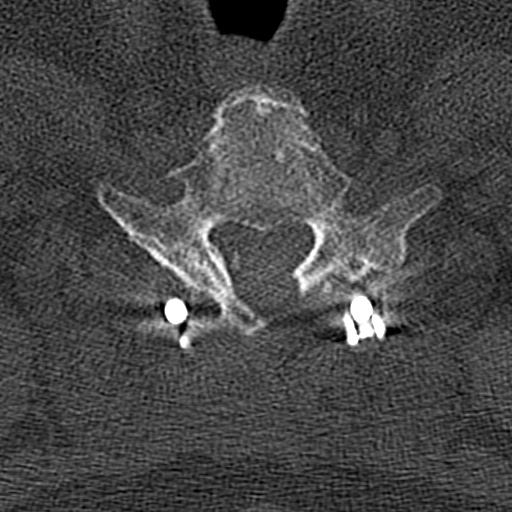
[im 43/86  bone]
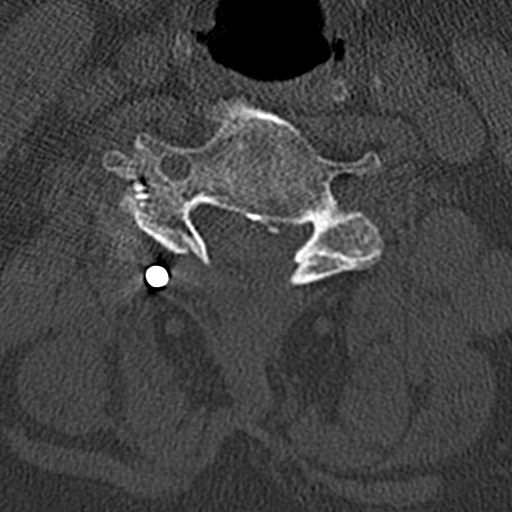
[im 71/86  bone]
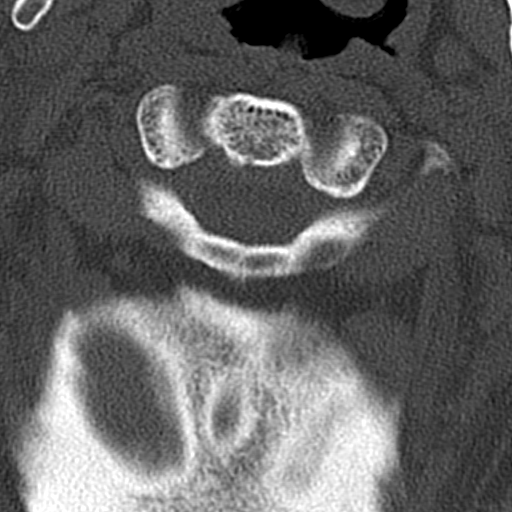

[11 of 33 positions shown; findings below may reference images not displayed]

FINDINGS: Alignment: 2 mm of chronic grade 1 anterolisthesis at C7-T1.

Skull base and vertebrae: Solid interbody fusion at C3-4. Spurring
leads to acquired interbody fusion at C2-3 as well as acquired facet
fusion there is facet fusion on the left at C2-C3-C4-C5-C6-C7 and on
the right at C3-C4-C5-C6-C7. Posterolateral rod and facet screw
fixators bilaterally at C4-C5-C6-C7. Posterior decompression from
the C3-4 level down through the C7 level. No acute fracture.
Superior spurring at the anterior C1-2 articulation.

Congenitally short pedicles in the cervical spine.

Soft tissues and spinal canal: Midline postoperative likely chronic
findings posterior to the posterior decompression site with complex
density in this vicinity measuring about 3.5 by 2.8 by 5.7 cm.

Mild bilateral common carotid atherosclerotic calcification.

Oval-shaped lesion within or along the posterior margin of the left
parotid gland measures 1.0 cm in short axis on image 25 series 5,
previously 0.9 cm on 11/01/2011. This may represent a lymph node.

Disc levels: C2-3: Moderate central narrowing of the thecal sac due
to short pedicles and posterior intervertebral spurring.

C3-4: Mild central narrowing of the thecal sac and mild left
foraminal stenosis due to intervertebral spurring and short
pedicles.

C4-5: Moderate left foraminal stenosis due to uncinate and facet
spurring.

C5-6: Borderline bilateral foraminal stenosis due to uncinate and
facet spurring.

C6-7: Mild right and borderline left foraminal stenosis due to
uncinate and facet spurring.

C7-T1: Prominent right moderate left foraminal stenosis due to
uncinate and facet spurring.

Upper chest: Unremarkable

Other: No supplemental non-categorized findings.
IMPRESSION: 1. Cervical spondylosis contributing to prominent impingement at
C7-T1; moderate impingement at C2-3 and C4-5; and mild impingement
at C3-4 and C6-7, as detailed above.
2. Posterior decompression from C3-4 down through C7, with a chronic
complex collection posterior to the decompression in the midline
which is likely a chronic postoperative fluid collection.
3. No fracture or acute bony findings.

## 2023-04-28 ENCOUNTER — Encounter: Payer: Self-pay | Admitting: *Deleted

## 2023-04-28 ENCOUNTER — Other Ambulatory Visit: Payer: Self-pay | Admitting: *Deleted

## 2023-04-28 MED ORDER — PEG 3350-KCL-NA BICARB-NACL 420 G PO SOLR: 4000.0000 mL | Freq: Once | ORAL | 0 refills | Status: DC

## 2023-04-28 MED ORDER — PEG 3350-KCL-NA BICARB-NACL 420 G PO SOLR: 4000.0000 mL | Freq: Once | ORAL | 0 refills | Status: AC

## 2023-04-28 NOTE — Telephone Encounter (Signed)
 Pt has been scheduled for 05/26/23, instructions faxed to [facility] at [facility] and prep sent to the pharmacy

## 2023-04-28 NOTE — Addendum Note (Signed)
 Addended by: Elinor Dodge on: 04/28/2023 10:47 AM   Modules accepted: Orders

## 2023-04-28 NOTE — Telephone Encounter (Signed)
 Prescription failed going through fax. Called [facility] and gave verbal for [place] prescription

## 2023-04-29 ENCOUNTER — Encounter: Payer: Self-pay | Admitting: *Deleted

## 2023-05-22 ENCOUNTER — Encounter (HOSPITAL_COMMUNITY): Payer: Self-pay

## 2023-05-22 ENCOUNTER — Encounter (HOSPITAL_COMMUNITY)
Admission: RE | Admit: 2023-05-22 | Discharge: 2023-05-22 | Disposition: A | Discharge status: Home / Self Care | Payer: Medicare Other | Source: Ambulatory Visit | Attending: Internal Medicine | Admitting: Internal Medicine

## 2023-05-22 DIAGNOSIS — D638 Anemia in other chronic diseases classified elsewhere: Secondary | ICD-10-CM | POA: Insufficient documentation

## 2023-05-22 DIAGNOSIS — Z01812 Encounter for preprocedural laboratory examination: Secondary | ICD-10-CM | POA: Diagnosis present

## 2023-05-22 LAB — CBC WITH DIFFERENTIAL/PLATELET
Abs Immature Granulocytes: 0.04 10*3/uL (ref 0.00–0.07)
Basophils Absolute: 0 10*3/uL (ref 0.0–0.1)
Basophils Relative: 0 %
Eosinophils Absolute: 0.1 10*3/uL (ref 0.0–0.5)
Eosinophils Relative: 1 %
HCT: 32.6 % — ABNORMAL LOW (ref 39.0–52.0)
Hemoglobin: 10.2 g/dL — ABNORMAL LOW (ref 13.0–17.0)
Immature Granulocytes: 1 %
Lymphocytes Relative: 7 %
Lymphs Abs: 0.6 10*3/uL — ABNORMAL LOW (ref 0.7–4.0)
MCH: 29.3 pg (ref 26.0–34.0)
MCHC: 31.3 g/dL (ref 30.0–36.0)
MCV: 93.7 fL (ref 80.0–100.0)
Monocytes Absolute: 0.5 10*3/uL (ref 0.1–1.0)
Monocytes Relative: 6 %
Neutro Abs: 6.8 10*3/uL (ref 1.7–7.7)
Neutrophils Relative %: 85 %
Platelets: 186 10*3/uL (ref 150–400)
RBC: 3.48 MIL/uL — ABNORMAL LOW (ref 4.22–5.81)
RDW: 17.2 % — ABNORMAL HIGH (ref 11.5–15.5)
WBC: 7.9 10*3/uL (ref 4.0–10.5)
nRBC: 0 % (ref 0.0–0.2)

## 2023-05-22 NOTE — Patient Instructions (Signed)
Colonoscopy, Adult A colonoscopy is an exam to look at the large intestine. It is done using a long, thin, flexible tube that has a camera on the end. This exam is done to check for problems, such as: An abnormal growth of cells or tissue (tumor). Abnormal growths within the lining of your intestine (polyps). Irritation and swelling (inflammation). Bleeding. Tell your doctor about: Any allergies you have. All medicines you are taking. Tell him or her about vitamins, herbs, eye drops, creams, and over-the-counter medicines. Any problems you or family members have had with anesthetic medicines. Any bleeding problems you have. Any surgeries you have had. Any medical conditions you have. Any problems you have had with pooping (having bowel movements). Whether you are pregnant or may be pregnant. What are the risks? Generally, this is a safe procedure. However, problems may occur, such as: Bleeding. Damage to your intestine. Allergic reactions to medicines given during the procedure. Infection. This is rare. What happens before the procedure? Eating and drinking Follow instructions from your doctor about eating and drinking. These may include: A few days before the procedure: Follow a low-fiber diet. Avoid these foods: Nuts. Seeds. Dried fruit. Raw fruits. Vegetables. 1-3 days before the procedure: Eat only gelatin dessert or ice pops. Drink only clear liquids, such as: Water. Clear broth or bouillon. Black coffee or tea. Clear juice. Clear soft drinks or sports drinks. Avoid liquids that have red or purple dye. On the day of the procedure: Do not eat solid foods. You may continue to drink clear liquids until up to 2 hours before the procedure. Do not eat or drink anything starting 2 hours before the procedure or as told by your doctor. Bowel prep If you were prescribed a bowel prep to take by mouth (orally) to clean out your colon: Take it as told by your doctor. Starting  the day before your procedure, you will need to drink a lot of liquid medicine. The liquid will cause you to poop until your poop is almost clear or light green. If your skin or butt gets irritated from diarrhea, you may: Wipe the area with wipes that have medicine in them, such as adult wet wipes with aloe and vitamin E. Put something on your skin that soothes the area, such as petroleum jelly. If you vomit while drinking the bowel prep: Take a break for up to 60 minutes. Begin the bowel prep again. Call your doctor if you keep vomiting and you cannot take the bowel prep without vomiting. To clean out your colon, you may also be given: Laxative medicines. These help you poop. Instructions for using a liquid medicine (enema) injected into your butt. Medicines Ask your doctor about changing or stopping: Your normal medicines. Vitamins, herbs, and supplements. Over-the-counter medicines. Do not take aspirin or ibuprofen unless you are told to. General instructions Ask your doctor what steps will be taken to prevent the spread of germs. These may include washing skin with a soap that kills germs. If you will be going home right after the procedure, plan to have a responsible adult: Take you home from the hospital or clinic. You will not be allowed to drive. Care for you for the time you are told. What happens during the procedure?  An IV tube will be put into one of your veins. You will be given a medicine to make you fall asleep (general anesthetic). You will lie on your side with your knees bent. Oil or gel will be put on  the tube. Then the tube will be: Put into the opening of the butt (anus). Gently put into your large intestine. Air will be sent into your colon to keep it open. You may feel some pressure or cramping. The camera will be used to take photos that will appear on a screen. A small tissue sample may be removed for testing (biopsy). If small growths are found, your doctor  may remove them and have them checked for cancer. The tube will be slowly removed. The procedure may vary among doctors and hospitals. What happens after the procedure? You will be monitored until you leave the hospital or clinic. This includes checking your blood pressure, heart rate, breathing rate, and blood oxygen level. You may have a small amount of blood in your poop. You may pass gas. You may have mild cramps or bloating in your belly (abdomen). If you were given a sedative during your procedure, do not drive or use machines until your doctor says that it is safe. It is up to you to get the results of your procedure. Ask how to get your results when they are ready. Summary A colonoscopy is an exam to look at the large intestine. Follow instructions from your doctor about eating and drinking before the procedure. You may be prescribed an oral bowel prep to clean out your colon. Take it as told by your doctor. A flexible tube with a camera on its end will be put into the opening of the butt. It will be passed into the large intestine. This information is not intended to replace advice given to you by your health care provider. Make sure you discuss any questions you have with your health care provider. Document Revised: 03/26/2021 Document Reviewed: 11/22/2020 Elsevier Patient Education  2024 [employer].

## 2023-05-26 ENCOUNTER — Ambulatory Visit (HOSPITAL_COMMUNITY): Payer: Medicare Other | Admitting: Anesthesiology

## 2023-05-26 ENCOUNTER — Ambulatory Visit (HOSPITAL_COMMUNITY)
Admission: RE | Admit: 2023-05-26 | Discharge: 2023-05-26 | Disposition: A | Discharge status: Home / Self Care | Payer: Medicare Other | Attending: Internal Medicine | Admitting: Internal Medicine

## 2023-05-26 ENCOUNTER — Encounter (HOSPITAL_COMMUNITY)
Admission: RE | Disposition: A | Discharge status: Home / Self Care | Payer: Self-pay | Source: Home / Self Care | Attending: Internal Medicine

## 2023-05-26 DIAGNOSIS — I509 Heart failure, unspecified: Secondary | ICD-10-CM | POA: Insufficient documentation

## 2023-05-26 DIAGNOSIS — D509 Iron deficiency anemia, unspecified: Secondary | ICD-10-CM | POA: Diagnosis present

## 2023-05-26 DIAGNOSIS — I110 Hypertensive heart disease with heart failure: Secondary | ICD-10-CM | POA: Diagnosis not present

## 2023-05-26 DIAGNOSIS — E039 Hypothyroidism, unspecified: Secondary | ICD-10-CM | POA: Insufficient documentation

## 2023-05-26 DIAGNOSIS — K648 Other hemorrhoids: Secondary | ICD-10-CM | POA: Insufficient documentation

## 2023-05-26 DIAGNOSIS — K5730 Diverticulosis of large intestine without perforation or abscess without bleeding: Secondary | ICD-10-CM | POA: Insufficient documentation

## 2023-05-26 DIAGNOSIS — F17210 Nicotine dependence, cigarettes, uncomplicated: Secondary | ICD-10-CM | POA: Insufficient documentation

## 2023-05-26 DIAGNOSIS — R195 Other fecal abnormalities: Secondary | ICD-10-CM | POA: Diagnosis present

## 2023-05-26 DIAGNOSIS — K219 Gastro-esophageal reflux disease without esophagitis: Secondary | ICD-10-CM | POA: Insufficient documentation

## 2023-05-26 DIAGNOSIS — Z8249 Family history of ischemic heart disease and other diseases of the circulatory system: Secondary | ICD-10-CM | POA: Insufficient documentation

## 2023-05-26 DIAGNOSIS — J449 Chronic obstructive pulmonary disease, unspecified: Secondary | ICD-10-CM | POA: Diagnosis not present

## 2023-05-26 HISTORY — PX: COLONOSCOPY WITH PROPOFOL: SHX5780

## 2023-05-26 SURGERY — COLONOSCOPY WITH PROPOFOL
Anesthesia: General

## 2023-05-26 MED ORDER — PROPOFOL 500 MG/50ML IV EMUL
INTRAVENOUS | Status: DC | PRN
  Administered 2023-05-26: 150 ug/kg/min via INTRAVENOUS

## 2023-05-26 MED ORDER — PHENYLEPHRINE 80 MCG/ML (10ML) SYRINGE FOR IV PUSH (FOR BLOOD PRESSURE SUPPORT)
PREFILLED_SYRINGE | INTRAVENOUS | Status: DC | PRN
  Administered 2023-05-26: 160 ug via INTRAVENOUS
  Administered 2023-05-26 (×2): 80 ug via INTRAVENOUS
  Administered 2023-05-26: 160 ug via INTRAVENOUS

## 2023-05-26 MED ORDER — LACTATED RINGERS IV SOLN: INTRAVENOUS | Status: DC | PRN

## 2023-05-26 MED ORDER — LACTATED RINGERS IV SOLN: INTRAVENOUS | Status: DC

## 2023-05-26 NOTE — Transfer of Care (Signed)
 Immediate Anesthesia Transfer of Care Note  Patient: [name]  Procedure(s) Performed: COLONOSCOPY WITH PROPOFOL   Patient Location: Short Stay  Anesthesia Type:General  Level of Consciousness: awake, alert , and patient cooperative  Airway & Oxygen  Therapy: Patient Spontanous Breathing  Post-op Assessment: Report given to RN, Post -op Vital signs reviewed and stable, and Patient moving all extremities X 4  Post vital signs: Reviewed and stable  Last Vitals:  Vitals Value Taken Time  BP 91/48 05/26/23 0932  Temp 36.6 C 05/26/23 0932  Pulse 80   Resp 22 05/26/23 0932  SpO2 97     Last Pain:  Vitals:   05/26/23 0932  TempSrc: Oral  PainSc: 9          Complications: No notable events documented.

## 2023-05-26 NOTE — Anesthesia Postprocedure Evaluation (Signed)
 Anesthesia Post Note  Patient: [name]  Procedure(s) Performed: COLONOSCOPY WITH PROPOFOL   Patient location during evaluation: PACU Anesthesia Type: General Level of consciousness: awake and alert Pain management: pain level controlled Vital Signs Assessment: post-procedure vital signs reviewed and stable Respiratory status: spontaneous breathing, nonlabored ventilation, respiratory function stable and patient connected to nasal cannula oxygen  Cardiovascular status: blood pressure returned to baseline and stable Postop Assessment: no apparent nausea or vomiting Anesthetic complications: no   No notable events documented.   Last Vitals:  Vitals:   05/26/23 0932 05/26/23 0940  BP: (!) 91/48 96/61  Pulse:    Resp: (!) 22   Temp: 36.6 C   SpO2:      Last Pain:  Vitals:   05/26/23 0932  TempSrc: Oral  PainSc: 9                  Freddi Jaeger

## 2023-05-26 NOTE — Op Note (Signed)
 [facility] Patient Name: [name] Procedure Date: 05/26/2023 8:48 AM MRN: [id number] Date of Birth: April 18, 1945 Attending MD: Rolando Cliche. Mordechai April , [place], [id number] CSN: [id number] Age: 78 Admit Type: Outpatient Procedure:                Colonoscopy Indications:              Heme positive stool Providers:                Rolando Cliche. Mordechai April, DO, Vonna Guardian, Theola Fitch Referring MD:              Medicines:                See the Anesthesia note for documentation of the                            administered medications Complications:            No immediate complications. Estimated Blood Loss:     Estimated blood loss: none. Procedure:                Pre-Anesthesia Assessment:                           - The anesthesia plan was to use monitored                            anesthesia care (MAC).                           After obtaining informed consent, the colonoscope                            was passed under direct vision. Throughout the                            procedure, the patient's blood pressure, pulse, and                            oxygen  saturations were monitored continuously. The                            [id number]-HQ190L ([id number]) scope was introduced through                            the anus and advanced to the the cecum, identified                            by appendiceal orifice and ileocecal valve. The                            colonoscopy was performed without difficulty. The                            patient tolerated the procedure well. The quality                            of the bowel  preparation was evaluated using the                            BBPS [facility] Bowel Preparation Scale) with scores                            of: Right Colon = 2 (minor amount of residual                            staining, small fragments of stool and/or opaque                            liquid, but mucosa seen well), Transverse Colon = 1                             (portion of mucosa seen, but other areas not well                            seen due to staining, residual stool and/or opaque                            liquid) and Left Colon = 1 (portion of mucosa seen,                            but other areas not well seen due to staining,                            residual stool and/or opaque liquid). The total                            BBPS score equals 4. The quality of the bowel                            preparation was inadequate. Scope In: 9:10:18 AM Scope Out: 9:23:29 AM Scope Withdrawal Time: 0 hours 5 minutes 54 seconds  Total Procedure Duration: 0 hours 13 minutes 11 seconds  Findings:      Non-bleeding internal hemorrhoids were found during endoscopy.      Multiple large-mouthed and small-mouthed diverticula were found in the       sigmoid colon and descending colon.      Extensive amounts of semi-solid stool was found in the entire colon,       precluding visualization. Lavage of the area was performed using copious       amounts of sterile water , resulting in incomplete clearance with       continued poor visualization. Impression:               - Preparation of the colon was inadequate.                           - Non-bleeding internal hemorrhoids.                           - Diverticulosis in the sigmoid colon and in the  descending colon.                           - Stool in the entire examined colon.                           - No specimens collected. Moderate Sedation:      Per Anesthesia Care Recommendation:           - Patient has a contact number available for                            emergencies. The signs and symptoms of potential                            delayed complications were discussed with the                            patient. Return to normal activities tomorrow.                            Written discharge instructions were provided to the                            patient.                            - Resume previous diet.                           - Continue present medications.                           - Technically incomplete colonoscopy today due to                            poor prep. No obvious malignancy or large polyps                            seen. No evidence or stigmata of bleeding. At this                            point, unclear if we will be able to prep patient                            adequately for complete colonoscopy. Follow-up in                            office in 2 to 3 months. Can discuss risk/benefits                            of trying again versus continued clinical                            monitoring. Procedure Code(s):        --- Professional ---  16109, Colonoscopy, flexible; diagnostic, including                            collection of specimen(s) by brushing or washing,                            when performed (separate procedure) Diagnosis Code(s):        --- Professional ---                           K64.8, Other hemorrhoids                           R19.5, Other fecal abnormalities                           K57.30, Diverticulosis of large intestine without                            perforation or abscess without bleeding CPT copyright 2022 American Medical Association. All rights reserved. The codes documented in this report are preliminary and upon coder review may  be revised to meet current compliance requirements. Rolando Cliche. Mordechai April, DO Rolando Cliche. Shaney Deckman, DO 05/26/2023 9:30:14 AM This report has been signed electronically. Number of Addenda: 0

## 2023-05-26 NOTE — Anesthesia Preprocedure Evaluation (Addendum)
 Anesthesia Evaluation  Patient identified by MRN, date of birth, ID band Patient awake    Reviewed: Allergy & Precautions, H&P , NPO status , Patient's Chart, lab work & pertinent test results, reviewed documented beta blocker date and time   Airway Mallampati: II  TM Distance: >3 FB Neck ROM: full    Dental no notable dental hx.    Pulmonary COPD, Current Smoker   Pulmonary exam normal breath sounds clear to auscultation       Cardiovascular Exercise Tolerance: Good hypertension, +CHF   Rhythm:regular Rate:Normal     Neuro/Psych  Neuromuscular disease  negative psych ROS   GI/Hepatic Neg liver ROS,GERD  ,,  Endo/Other  Hypothyroidism    Renal/GU Renal disease  negative genitourinary   Musculoskeletal   Abdominal   Peds  Hematology  (+) Blood dyscrasia, anemia   Anesthesia Other Findings   Reproductive/Obstetrics negative OB ROS                              Anesthesia Physical Anesthesia Plan  ASA: 2  Anesthesia Plan: General   Post-op Pain Management:    Induction:   PONV Risk Score and Plan: Propofol  infusion  Airway Management Planned:   Additional Equipment:   Intra-op Plan:   Post-operative Plan:   Informed Consent: I have reviewed the patients History and Physical, chart, labs and discussed the procedure including the risks, benefits and alternatives for the proposed anesthesia with the patient or authorized representative who has indicated his/her understanding and acceptance.     Dental Advisory Given  Plan Discussed with: CRNA  Anesthesia Plan Comments:         Anesthesia Quick Evaluation

## 2023-05-26 NOTE — Discharge Instructions (Addendum)
  Colonoscopy Discharge Instructions  Read the instructions outlined below and refer to this sheet in the next few weeks. These discharge instructions provide you with general information on caring for yourself after you leave the hospital. Your doctor may also give you specific instructions. While your treatment has been planned according to the most current medical practices available, unavoidable complications occasionally occur.   ACTIVITY You may resume your regular activity, but move at a slower pace for the next 24 hours.  Take frequent rest periods for the next 24 hours.  Walking will help get rid of the air and reduce the bloated feeling in your belly (abdomen).  No driving for 24 hours (because of the medicine (anesthesia) used during the test).   Do not sign any important legal documents or operate any machinery for 24 hours (because of the anesthesia used during the test).  NUTRITION Drink plenty of fluids.  You may resume your normal diet as instructed by your doctor.  Begin with a light meal and progress to your normal diet. Heavy or fried foods are harder to digest and may make you feel sick to your stomach (nauseated).  Avoid alcoholic beverages for 24 hours or as instructed.  MEDICATIONS You may resume your normal medications unless your doctor tells you otherwise.  WHAT YOU CAN EXPECT TODAY Some feelings of bloating in the abdomen.  Passage of more gas than usual.  Spotting of blood in your stool or on the toilet paper.  IF YOU HAD POLYPS REMOVED DURING THE COLONOSCOPY: No aspirin  products for 7 days or as instructed.  No alcohol for 7 days or as instructed.  Eat a soft diet for the next 24 hours.  FINDING OUT THE RESULTS OF YOUR TEST Not all test results are available during your visit. If your test results are not back during the visit, make an appointment with your caregiver to find out the results. Do not assume everything is normal if you have not heard from your  caregiver or the medical facility. It is important for you to follow up on all of your test results.  SEEK IMMEDIATE MEDICAL ATTENTION IF: You have more than a spotting of blood in your stool.  Your belly is swollen (abdominal distention).  You are nauseated or vomiting.  You have a temperature over 101.  You have abdominal pain or discomfort that is severe or gets worse throughout the day.   Unfortunately, your colon was not adequately prepped today for colonoscopy.  I did not see any evidence of colon cancer or large polyps, but certainly could have missed smaller polyps due to poor visualization.  At this point, I am unsure if we will be able to get your colon clean enough for adequate colonoscopy.  Follow-up in office in 2 to 3 months.  We can discuss risk/benefits of trying again versus continuing to monitor you clinically.   I hope you have a great rest of your week!  Rolando Cliche. Mordechai April, D.O. Gastroenterology and Hepatology [facility]

## 2023-05-26 NOTE — H&P (Signed)
 Primary Care Physician:  Zarwolo, Gloria, FNP Primary Gastroenterologist:  Dr. Mordechai April  Pre-Procedure History & Physical: HPI:  [name] is a 78 y.o. male is here for a colonoscopy to be performed for iron deficiency anemia, heme positive stool.   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   Nausea vomiting and diarrhea 09/14/2022   Restless leg syndrome     Past Surgical History:  Procedure Laterality Date   BACK SURGERY     Fusion 2005   BIOPSY  09/18/2022   Procedure: BIOPSY;  Surgeon: Sergio Dandy, MD;  Location: [facility] ENDOSCOPY;  Service: Gastroenterology;;   COLONOSCOPY WITH PROPOFOL  N/A 11/29/2022   Procedure: COLONOSCOPY WITH PROPOFOL ;  Surgeon: Vinetta Greening, DO;  Location: [facility] ENDO SUITE;  Service: Endoscopy;  Laterality: N/A;  145pm, asa 3, [place]   ESOPHAGOGASTRODUODENOSCOPY (EGD) WITH PROPOFOL  N/A 09/18/2022   Procedure: ESOPHAGOGASTRODUODENOSCOPY (EGD) WITH PROPOFOL ;  Surgeon: Sergio Dandy, MD;  Location: [facility] ENDOSCOPY;  Service: Gastroenterology;  Laterality: N/A;   Graves Disease     JOINT REPLACEMENT  1970   Right knee    LAMINECTOMY WITH POSTERIOR LATERAL ARTHRODESIS LEVEL 2 N/A 06/29/2021   Procedure: Laminectomy - Lumbar one-two, Lumbar two-three - redo with posterior lateral fusion with pedicle screws;  Surgeon: Agustina Aldrich, MD;  Location: [facility] OR;  Service: Neurosurgery;  Laterality: N/A;   NECK SURGERY     POSTERIOR CERVICAL FUSION/FORAMINOTOMY  12/05/2011   Procedure: POSTERIOR CERVICAL FUSION/FORAMINOTOMY LEVEL 4;  Surgeon: Augustine Blocker, MD;  Location: [facility] NEURO ORS;  Service: Neurosurgery;  Laterality: N/A;  Posterior Cervical fixation Cervical  four-Seven,Cervical Decompresson Cervical three   THORACIC DISCECTOMY N/A 01/08/2021   Procedure: Thoracic One-Two, Thoracic Two-Three, Thoracic Eleven-Twelve, Thoracic Twelve-Lumbar One Laminectomy;  Surgeon: Agustina Aldrich, MD;  Location: [facility] OR;  Service: Neurosurgery;  Laterality: N/A;    Prior to Admission medications   Medication Sig Start Date End Date Taking? Authorizing Provider  acetaminophen  (TYLENOL ) 325 MG tablet Take 650 mg by mouth 3 (three) times daily.    [provider]  aspirin  81 MG chewable tablet Chew 1 tablet (81 mg total) by mouth daily. 09/04/21   Zarwolo, Gloria, FNP  Cholecalciferol  (VITAMIN D3) 1.25 MG (50000 UT) TABS Take 50,000 Units by mouth every Wednesday.    [provider]  DULoxetine  HCl 60 MG CSDR Take 1 capsule by mouth daily.    [provider]  ferrous sulfate  325 (65 FE) MG tablet Take 325 mg by mouth daily with breakfast.    [provider]  levothyroxine  (SYNTHROID ) 150 MCG tablet TAKE 1 TABLET BY MOUTH DAILY BEFORE BREAKFAST 02/04/22   Zarwolo, Gloria, FNP  loperamide  (IMODIUM  A-D) 2 MG tablet Take 2 mg by mouth 4 (four) times daily as needed for diarrhea or loose stools.    [provider]  Magnesium  Oxide 400 MG CAPS Take 2 capsules (800 mg total) by mouth daily. 09/20/22   Leona Rake, MD  Multiple Vitamins-Minerals (PRESERVISION AREDS 2 PO) Take 1 capsule by mouth daily.    [provider]  pantoprazole  (PROTONIX ) 40 MG tablet Take 1 tablet (40 mg  total) by mouth 2 (two) times daily. 09/20/22   Adhikari, Amrit, MD  polyethylene glycol (MIRALAX  / GLYCOLAX ) 17 g packet Take 17 g by mouth daily. Hold for 24 hours if watery stools, or more than 3 stools daily. 02/24/23   Lanney Pitts, PA-C  pregabalin  (LYRICA ) 100 MG capsule Take 1 capsule (100 mg total) by mouth 2 (two) times daily. 10/03/22   Sater, Sherida Dimmer, MD  senna (SENOKOT) 8.6 MG TABS tablet Take 1 tablet (8.6 mg total) by mouth daily. 09/20/22    Leona Rake, MD  simvastatin  (ZOCOR ) 20 MG tablet TAKE 1 TABLET BY MOUTH AT BEDTIME 07/02/22   Zarwolo, Gloria, FNP  sucralfate (CARAFATE) 1 g tablet Take 1 g by mouth 4 (four) times daily -  with meals and at bedtime.    [provider]  tamsulosin  (FLOMAX ) 0.4 MG CAPS capsule Take 1 capsule (0.4 mg total) by mouth 2 (two) times daily. 09/20/22   Leona Rake, MD  triamcinolone  cream (KENALOG ) 0.1 % Apply 1 Application topically 2 (two) times daily.    [provider]  trimethoprim  (TRIMPEX ) 100 MG tablet Take 1 tablet (100 mg total) by mouth at bedtime. 09/23/22   Adhikari, Amrit, MD  vitamin B-12 (CYANOCOBALAMIN ) 1000 MCG tablet Take 1,000 mcg by mouth daily.    [provider]    Allergies as of 04/28/2023 - Review Complete 02/24/2023  Allergen Reaction Noted   Sulfa antibiotics Other (See Comments) 11/01/2011   Hydrocodone   10/16/2022   Misc. sulfonamide containing compounds  02/24/2023    Family History  Problem Relation Age of Onset   Heart attack Mother 75   Heart disease Father     Social History   Socioeconomic History   Marital status: Married    Spouse name: Murlean Armour. Hagey   Number of children: 1   Years of education: 12   Highest education level: Not on file  Occupational History   Not on file  Tobacco Use   Smoking status: Some Days    Current packs/day: 0.25    Average packs/day: 0.3 packs/day for 50.0 years (12.5 ttl pk-yrs)    Types: Cigarettes   Smokeless tobacco: Never  Vaping Use   Vaping status: Never Used  Substance and Sexual Activity   Alcohol use: No   Drug use: Not Currently   Sexual activity: Not on file  Other Topics Concern   Not on file  Social History Narrative   Right handed   Lives at SNF   No caffeine use   Social Drivers of Corporate investment banker Strain: Low Risk  (09/18/2021)   Overall Financial Resource Strain (CARDIA)    Difficulty of Paying Living Expenses: Not hard at all  Food  Insecurity: No Food Insecurity (09/18/2021)   Hunger Vital Sign    Worried About Running Out of Food in the Last Year: Never true    Ran Out of Food in the Last Year: Never true  Transportation Needs: No Transportation Needs (09/18/2021)   PRAPARE - Administrator, Civil Service (Medical): No    Lack of Transportation (Non-Medical): No  Physical Activity: Insufficiently Active (09/18/2021)   Exercise Vital Sign    Days of Exercise per Week: 5 days    Minutes of Exercise per Session: 20 min  Stress: No Stress Concern Present (09/18/2021)   [employer] of Occupational Health - Occupational Stress Questionnaire    Feeling of Stress : Not at all  Social Connections:  Moderately Integrated (09/18/2021)   Social Connection and Isolation Panel [NHANES]    Frequency of Communication with Friends and Family: More than three times a week    Frequency of Social Gatherings with Friends and Family: More than three times a week    Attends Religious Services: More than 4 times per year    Active Member of [employer] or Organizations: No    Attends Banker Meetings: Never    Marital Status: Married  Catering manager Violence: Not At Risk (09/18/2021)   Humiliation, Afraid, Rape, and Kick questionnaire    Fear of Current or Ex-Partner: No    Emotionally Abused: No    Physically Abused: No    Sexually Abused: No    Review of Systems: See HPI, otherwise negative ROS  Physical Exam: Vital signs in last 24 hours:     General:   Alert,  Well-developed, well-nourished, pleasant and cooperative in NAD Head:  Normocephalic and atraumatic. Eyes:  Sclera clear, no icterus.   Conjunctiva pink. Ears:  Normal auditory acuity. Nose:  No deformity, discharge,  or lesions. Msk:  Symmetrical without gross deformities. Normal posture. Extremities:  Without clubbing or edema. Neurologic:  Alert and  oriented x4;  grossly normal neurologically. Skin:  Intact without significant lesions or  rashes. Psych:  Alert and cooperative. Normal mood and affect.  Impression/Plan: [name] is here for a colonoscopy to be performed for iron deficiency anemia, heme positive stool.   The risks of the procedure including infection, bleed, or perforation as well as benefits, limitations, alternatives and imponderables have been reviewed with the patient. Questions have been answered. All parties agreeable.

## 2023-05-27 ENCOUNTER — Encounter (HOSPITAL_COMMUNITY): Payer: Self-pay | Admitting: Internal Medicine

## 2023-05-27 NOTE — Progress Notes (Signed)
Patient lives in facility.

## 2023-07-02 ENCOUNTER — Encounter: Payer: Self-pay | Admitting: Internal Medicine

## 2023-07-14 NOTE — Progress Notes (Deleted)
 Name: [name] DOB: 1945/12/04 MRN: [id number]  History of Present Illness: [name] is a 78 y.o. male who presents today at [facility].  ***He is accompanied by ***. GU History includes: 1. 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 - ***Taking Flomax (Tamsulosin) 0.4 mg twice daily. 2. Recurrent UTIs.  - ***Taking Trimethoprim 100 mg daily for UTI prophylaxis. 3. Kidney stone(s). 4. Bilateral renal cysts.  Urine culture results in past 12 months: - 07/09/2022: Positive for Pseudomonas aeruginosa - 09/15/2022: Positive for Pseudomonas aeruginosa  At last visit with Dr. Ronne Binning on 07/13/2021: - PVR = 345 ml.  Since last visit: > 09/14/2022 - 09/20/2022: Admitted for acute renal failure.  - "Previous creatinine was 1.1 as per 07/31/2022.  Presented with creatinine in the range of 11.  This is most likely prerenal AKI because of dehydration from vomiting and diarrhea.  Ultrasound of the kidneys did not show any hydronephrosis but showed bilateral simple renal cyst.  Kidney function has significantly improved with IV fluids.Patient also takes benazepril at home.  Home benazepril on hold.  Foley will be removed, if fails voiding trail,may need to insert Foley again and follow-up with urology".  - "UA showed plenty of bacteria, leukocytes.  On chronic trimethoprim therapy at home.  Urine culture showed Pseudomonas.  Currently on oral Cipro,will complete the course on 6/8".  - CT abdomen/pelvis w/o contrast on 09/16/2022 showed "punctate nonobstructing left renal calculus. Bilateral renal cysts are present measuring up to 4.9 cm. There is no hydronephrosis. The adrenal glands are within normal limits. There is a Foley catheter in the bladder. Air in the bladder is likely related to Foley catheter.Marland KitchenMarland KitchenProstate is unremarkable."  Today: He reports ***  He {Actions; denies-reports:120008} urinary urgency, frequency, nocturia, dysuria, gross hematuria,  ***weak urinary stream, hesitancy, straining to void, or sensations of incomplete emptying. Reports voiding ***x/day and ***x/night.  He {Actions; denies-reports:120008} constipation.   Medications: Current Outpatient Medications  Medication Sig Dispense Refill   acetaminophen (TYLENOL) 325 MG tablet Take 650 mg by mouth 3 (three) times daily.     aspirin 81 MG chewable tablet Chew 1 tablet (81 mg total) by mouth daily. 90 tablet 0   Cholecalciferol (VITAMIN D3) 1.25 MG (50000 UT) TABS Take 50,000 Units by mouth every Wednesday.     DULoxetine HCl 60 MG CSDR Take 1 capsule by mouth daily.     ferrous sulfate 325 (65 FE) MG tablet Take 325 mg by mouth daily with breakfast.     levothyroxine (SYNTHROID) 150 MCG tablet TAKE 1 TABLET BY MOUTH DAILY BEFORE BREAKFAST 90 tablet 2   loperamide (IMODIUM A-D) 2 MG tablet Take 2 mg by mouth 4 (four) times daily as needed for diarrhea or loose stools.     Magnesium Oxide 400 MG CAPS Take 2 capsules (800 mg total) by mouth daily.  0   Multiple Vitamins-Minerals (PRESERVISION AREDS 2 PO) Take 1 capsule by mouth daily.     pantoprazole (PROTONIX) 40 MG tablet Take 1 tablet (40 mg total) by mouth 2 (two) times daily. 60 tablet 1   polyethylene glycol (MIRALAX / GLYCOLAX) 17 g packet Take 17 g by mouth daily. Hold for 24 hours if watery stools, or more than 3 stools daily. 28 each 5   pregabalin (LYRICA) 100 MG capsule Take 1 capsule (100 mg total) by mouth 2 (two) times daily. 60 capsule 5   senna (SENOKOT) 8.6 MG TABS tablet Take 1 tablet (8.6 mg  total) by mouth daily. 120 tablet 0   simvastatin (ZOCOR) 20 MG tablet TAKE 1 TABLET BY MOUTH AT BEDTIME 90 tablet 0   sucralfate (CARAFATE) 1 g tablet Take 1 g by mouth 4 (four) times daily -  with meals and at bedtime.     tamsulosin (FLOMAX) 0.4 MG CAPS capsule Take 1 capsule (0.4 mg total) by mouth 2 (two) times daily. 30 capsule    triamcinolone cream (KENALOG) 0.1 % Apply 1 Application topically 2 (two) times  daily.     trimethoprim (TRIMPEX) 100 MG tablet Take 1 tablet (100 mg total) by mouth at bedtime. 30 tablet 11   vitamin B-12 (CYANOCOBALAMIN) 1000 MCG tablet Take 1,000 mcg by mouth daily.     No current facility-administered medications for this visit.    Allergies: Allergies  Allergen Reactions   Sulfa Antibiotics Other (See Comments)    Causes blisters and skin to peel from inside mouth   Hydrocodone    Misc. Sulfonamide Containing Compounds     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   Nausea vomiting and diarrhea 09/14/2022   Restless leg syndrome    Past Surgical History:  Procedure Laterality Date   BACK SURGERY     Fusion 2005   BIOPSY  09/18/2022   Procedure: BIOPSY;  Surgeon: Napoleon Form, MD;  Location: [facility] ENDOSCOPY;  Service: Gastroenterology;;   COLONOSCOPY WITH PROPOFOL N/A 11/29/2022   Procedure: COLONOSCOPY WITH PROPOFOL;  Surgeon: Lanelle Bal, DO;  Location: [facility]O [facility];  Service: Endoscopy;  Laterality: N/A;  145pm, asa 3, [place]   COLONOSCOPY WITH PROPOFOL N/A 05/26/2023   Procedure: COLONOSCOPY WITH PROPOFOL;  Surgeon: Lanelle Bal, DO;  Location: [facility] ENDO [facility];  Service: Endoscopy;  Laterality: N/A;  8:15 am, asa 3   ESOPHAGOGASTRODUODENOSCOPY (EGD) WITH PROPOFOL N/A 09/18/2022   Procedure: ESOPHAGOGASTRODUODENOSCOPY (EGD) WITH PROPOFOL;  Surgeon: Napoleon Form, MD;  Location: [facility] ENDOSCOPY;  Service: Gastroenterology;  Laterality: N/A;   Graves Disease     JOINT REPLACEMENT  1970   Right knee    LAMINECTOMY WITH POSTERIOR LATERAL ARTHRODESIS LEVEL 2 N/A 06/29/2021   Procedure: Laminectomy - Lumbar one-two, Lumbar two-three - redo with posterior  lateral fusion with pedicle screws;  Surgeon: Julio Sicks, MD;  Location: [facility] OR;  Service: Neurosurgery;  Laterality: N/A;   NECK SURGERY     POSTERIOR CERVICAL FUSION/FORAMINOTOMY  12/05/2011   Procedure: POSTERIOR CERVICAL FUSION/FORAMINOTOMY LEVEL 4;  Surgeon: Reinaldo Meeker, MD;  Location: [facility] NEURO ORS;  Service: Neurosurgery;  Laterality: N/A;  Posterior Cervical fixation Cervical four-Seven,Cervical Decompresson Cervical three   THORACIC DISCECTOMY N/A 01/08/2021   Procedure: Thoracic One-Two, Thoracic Two-Three, Thoracic Eleven-Twelve, Thoracic Twelve-Lumbar One Laminectomy;  Surgeon: Julio Sicks, MD;  Location: [facility] OR;  Service: Neurosurgery;  Laterality: N/A;   Family History  Problem Relation Age of Onset   Heart attack Mother 68   Heart disease Father    Social History   Socioeconomic History   Marital status: Married    Spouse name: Keane Scrape. Kennan   Number of children: 1   Years of education: 34  Highest education level: Not on file  Occupational History   Not on file  Tobacco Use   Smoking status: Some Days    Current packs/day: 0.25    Average packs/day: 0.3 packs/day for 50.0 years (12.5 ttl pk-yrs)    Types: Cigarettes   Smokeless tobacco: Never  Vaping Use   Vaping status: Never Used  Substance and Sexual Activity   Alcohol use: No   Drug use: Not Currently   Sexual activity: Not on file  Other Topics Concern   Not on file  Social History Narrative   Right handed   Lives at SNF   No caffeine use   Social Drivers of Corporate investment banker Strain: Low Risk  (09/18/2021)   Overall Financial Resource Strain (CARDIA)    Difficulty of Paying Living Expenses: Not hard at all  Food Insecurity: No Food Insecurity (09/18/2021)   Hunger Vital Sign    Worried About Running Out of Food in the Last Year: Never true    Ran Out of Food in the Last Year: Never true  Transportation Needs: No Transportation Needs (09/18/2021)   PRAPARE - Doctor, general practice (Medical): No    Lack of Transportation (Non-Medical): No  Physical Activity: Insufficiently Active (09/18/2021)   Exercise Vital Sign    Days of Exercise per Week: 5 days    Minutes of Exercise per Session: 20 min  Stress: No Stress Concern Present (09/18/2021)   [employer] of Occupational Health - Occupational Stress Questionnaire    Feeling of Stress : Not at all  Social Connections: Moderately Integrated (09/18/2021)   Social Connection and Isolation Panel [NHANES]    Frequency of Communication with Friends and Family: More than three times a week    Frequency of Social Gatherings with Friends and Family: More than three times a week    Attends Religious Services: More than 4 times per year    Active Member of [employer] or Organizations: No    Attends Banker Meetings: Never    Marital Status: Married  Catering manager Violence: Not At Risk (09/18/2021)   Humiliation, Afraid, Rape, and Kick questionnaire    Fear of Current or Ex-Partner: No    Emotionally Abused: No    Physically Abused: No    Sexually Abused: No    Review of Systems Constitutional: Patient denies any unintentional weight loss or change in strength lntegumentary: Patient denies any rashes or pruritus Cardiovascular: Patient denies chest pain or syncope Respiratory: Patient denies shortness of breath Gastrointestinal: ***Patient {Actions; denies-reports:120008} ***nausea, ***vomiting, ***constipation, ***diarrhea ***As per HPI Musculoskeletal: Patient denies muscle cramps or weakness Neurologic: Patient denies convulsions or seizures Allergic/Immunologic: Patient denies recent allergic reaction(s) Hematologic/Lymphatic: Patient denies bleeding tendencies Endocrine: Patient denies heat/cold intolerance  GU: As per HPI.  OBJECTIVE There were no vitals filed for this visit. There is no height or weight on file to calculate BMI.  Physical Examination Constitutional: No obvious  distress; patient is non-toxic appearing  Cardiovascular: No visible lower extremity edema.  Respiratory: The patient does not have audible wheezing/stridor; respirations do not appear labored  Gastrointestinal: Abdomen non-distended Musculoskeletal: Normal ROM of UEs  Skin: No obvious rashes/open sores  Neurologic: CN 2-12 grossly intact Psychiatric: Answered questions appropriately with normal affect  Hematologic/Lymphatic/Immunologic: No obvious bruises or sites of spontaneous bleeding  UA: ***negative ***positive for *** leukocytes, *** blood, ***nitrites Urine microscopy: *** WBC/hpf, *** RBC/hpf, *** bacteria ***otherwise unremarkable ***glucosuria (secondary to ***Jardiance ***Farxiga use)  PVR: *** ml  ASSESSMENT No diagnosis found. ***  We agreed to plan for follow up in *** months / ***1 year or sooner if needed. Patient verbalized understanding of and agreement with current plan. All questions were answered.  PLAN Advised the following: 1. *** 2. ***No follow-ups on file.  No orders of the defined types were placed in this encounter.   It has been explained that the patient is to follow regularly with their PCP in addition to all other providers involved in their care and to follow instructions provided by these respective offices. Patient advised to contact urology clinic if any urologic-pertaining questions, concerns, new symptoms or problems arise in the interim period.  There are no Patient Instructions on file for this visit.  Electronically signed by:  Donnita Falls, FNP   07/14/23    11:26 AM

## 2023-07-15 ENCOUNTER — Ambulatory Visit: Payer: Medicare Other | Admitting: Urology

## 2023-07-15 DIAGNOSIS — R339 Retention of urine, unspecified: Secondary | ICD-10-CM

## 2023-07-15 DIAGNOSIS — N400 Enlarged prostate without lower urinary tract symptoms: Secondary | ICD-10-CM

## 2023-07-15 DIAGNOSIS — N200 Calculus of kidney: Secondary | ICD-10-CM

## 2023-07-28 NOTE — Progress Notes (Unsigned)
 GI Office Note    Referring Provider: Gilmore Laroche, FNP Primary Care Physician:  Gilmore Laroche, FNP  Primary Gastroenterologist: Hennie Duos. Marletta Lor, DO   Chief Complaint   No chief complaint on file.   History of Present Illness   [name] is a 78 y.o. male presenting today for follow up. Last seen 02/2023. Attempted colonoscopy in 11/2022 for acute on chronic anemia with heme positive stool, but prep inadequate.  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     Per op note, patient reported eating breakfast the day before his procedure even though he knew he was not supposed to? He resides at [facility]. Patient does not recall at ths time if that was the case. He states he did all of the laxative correctly but he did not have a BM until the morning of the colonoscopy.       CT abdomen pelvis without contrast September 16, 2022: IMPRESSION: 1. Cholelithiasis. Gallbladder distended but no surrounding inflammation. No biliary dilation. 2. Nonobstructing left renal calculus. 3. 3.5 cm infrarenal abdominal aortic aneurysm. Recommend follow-up every 2 years. 4. Bosniak I benign renal cyst measuring 4.9 cm. No follow-up imaging is recommended. JACR 2018 Feb; 264-273, Management of the Incidental Renal Mass on CT, [employer] 2021; 814-848, Bosniak Classification of Cystic Renal Masses, Version 2019. Aortic Atherosclerosis (ICD10-I70.0).   CT abdomen pelvis without contrast Sep 13, 2022 at [facility]: 1. A specific cause for the patient's nausea and vomiting is not  identified.  2. Infrarenal abdominal aortic aneurysm 3.6 cm in diameter, stable  by my  measurements. Recommend follow-up ultrasound every 2 years.  This recommendation follows ACR consensus guidelines: White Paper of  the ACR Incidental Findings Committee II on Vascular Findings. J Am  Coll Radiol 2013; 10:789-794.  3. Cholelithiasis.  4. Sigmoid colon diverticulosis.  5. Generalized regional muscular atrophy.  6. Multilevel foraminal impingement in the lumbar spine due to  congenitally short pedicles. Multilevel lumbar fusion.  7. Prominent degenerative hip arthropathy bilaterally.  8. Stable bilateral renal lesions, not changed from 08/01/2022 where  these were deemed benign. No further imaging workup of these lesions  is indicated.  9. Aortic and coronary atherosclerosis.    EGD 09/2022:  -normal esophagus -gastritis s/p bx   Colonoscopy 16109: -preparation of colon inadequate, stool in the entire examined colon -nonbleeding internal hemorrhoids -diverticulosis in sigmoid colon and descending colon -repeat colonoscopy in 3-6 months   Colonoscopy 05/2023: -preparation of colon was inadequate -non-bleeding internal hemorrhoids -diveritulcosis in sigmoid colon and desceding colon -stool in entire examined colon -second failed colonoscopy due to poor prep.  -follow up in office in 2-3 months to discuss risk/benefits of trying again vs continued clinical monitoring      Medications   Current Outpatient Medications  Medication Sig Dispense Refill   acetaminophen (TYLENOL) 325 MG tablet Take 650 mg by mouth 3 (three) times daily.     aspirin 81 MG chewable tablet Chew 1 tablet (81 mg total) by mouth daily. 90 tablet 0   Cholecalciferol (VITAMIN D3) 1.25 MG (50000 UT) TABS Take 50,000 Units by mouth every Wednesday.     DULoxetine HCl 60 MG CSDR Take 1 capsule by mouth daily.     ferrous sulfate 325 (65 FE) MG  tablet Take 325 mg by mouth daily with breakfast.     levothyroxine (SYNTHROID) 150 MCG tablet TAKE 1 TABLET BY MOUTH DAILY BEFORE BREAKFAST 90 tablet 2    loperamide (IMODIUM A-D) 2 MG tablet Take 2 mg by mouth 4 (four) times daily as needed for diarrhea or loose stools.     Magnesium Oxide 400 MG CAPS Take 2 capsules (800 mg total) by mouth daily.  0   Multiple Vitamins-Minerals (PRESERVISION AREDS 2 PO) Take 1 capsule by mouth daily.     pantoprazole (PROTONIX) 40 MG tablet Take 1 tablet (40 mg total) by mouth 2 (two) times daily. 60 tablet 1   polyethylene glycol (MIRALAX / GLYCOLAX) 17 g packet Take 17 g by mouth daily. Hold for 24 hours if watery stools, or more than 3 stools daily. 28 each 5   pregabalin (LYRICA) 100 MG capsule Take 1 capsule (100 mg total) by mouth 2 (two) times daily. 60 capsule 5   senna (SENOKOT) 8.6 MG TABS tablet Take 1 tablet (8.6 mg total) by mouth daily. 120 tablet 0   simvastatin (ZOCOR) 20 MG tablet TAKE 1 TABLET BY MOUTH AT BEDTIME 90 tablet 0   sucralfate (CARAFATE) 1 g tablet Take 1 g by mouth 4 (four) times daily -  with meals and at bedtime.     tamsulosin (FLOMAX) 0.4 MG CAPS capsule Take 1 capsule (0.4 mg total) by mouth 2 (two) times daily. 30 capsule    triamcinolone cream (KENALOG) 0.1 % Apply 1 Application topically 2 (two) times daily.     trimethoprim (TRIMPEX) 100 MG tablet Take 1 tablet (100 mg total) by mouth at bedtime. 30 tablet 11   vitamin B-12 (CYANOCOBALAMIN) 1000 MCG tablet Take 1,000 mcg by mouth daily.     No current facility-administered medications for this visit.    Allergies   Allergies as of 07/29/2023 - Review Complete 05/26/2023  Allergen Reaction Noted   Sulfa antibiotics Other (See Comments) 11/01/2011   Hydrocodone  10/16/2022   Misc. sulfonamide containing compounds  02/24/2023     Past Medical History   Past Medical History:  Diagnosis Date   Arthritis    Bronchitis    hx of   COPD (chronic obstructive pulmonary disease) (HCC)    GERD (gastroesophageal reflux disease)    History of kidney stones    Hypertension    Hypothyroidism    Macular degeneration     right eye   Myelopathy (HCC)    unable to ambulate   Nausea vomiting and diarrhea 09/14/2022   Restless leg syndrome     Past Surgical History   Past Surgical History:  Procedure Laterality Date   BACK SURGERY     Fusion 2005   BIOPSY  09/18/2022   Procedure: BIOPSY;  Surgeon: Napoleon Form, MD;  Location: [facility] ENDOSCOPY;  Service: Gastroenterology;;   COLONOSCOPY WITH PROPOFOL N/A 11/29/2022   Procedure: COLONOSCOPY WITH PROPOFOL;  Surgeon: Lanelle Bal, DO;  Location: [facility] ENDO SUITE;  Service: Endoscopy;  Laterality: N/A;  145pm, asa 3, [place]   COLONOSCOPY WITH PROPOFOL N/A 05/26/2023   Procedure: COLONOSCOPY WITH PROPOFOL;  Surgeon: Lanelle Bal, DO;  Location: [facility] ENDO SUITE;  Service: Endoscopy;  Laterality: N/A;  8:15 am, asa 3   ESOPHAGOGASTRODUODENOSCOPY (EGD) WITH PROPOFOL N/A 09/18/2022   Procedure: ESOPHAGOGASTRODUODENOSCOPY (EGD) WITH PROPOFOL;  Surgeon: Napoleon Form, MD;  Location: [facility] ENDOSCOPY;  Service: Gastroenterology;  Laterality: N/A;   Graves Disease  JOINT REPLACEMENT  1970   Right knee    LAMINECTOMY WITH POSTERIOR LATERAL ARTHRODESIS LEVEL 2 N/A 06/29/2021   Procedure: Laminectomy - Lumbar one-two, Lumbar two-three - redo with posterior lateral fusion with pedicle screws;  Surgeon: Julio Sicks, MD;  Location: [facility] OR;  Service: Neurosurgery;  Laterality: N/A;   NECK SURGERY     POSTERIOR CERVICAL FUSION/FORAMINOTOMY  12/05/2011   Procedure: POSTERIOR CERVICAL FUSION/FORAMINOTOMY LEVEL 4;  Surgeon: Reinaldo Meeker, MD;  Location: [facility] NEURO ORS;  Service: Neurosurgery;  Laterality: N/A;  Posterior Cervical fixation Cervical four-Seven,Cervical Decompresson Cervical three   THORACIC DISCECTOMY N/A 01/08/2021   Procedure: Thoracic One-Two, Thoracic Two-Three, Thoracic Eleven-Twelve, Thoracic Twelve-Lumbar One Laminectomy;  Surgeon: Julio Sicks, MD;  Location: [facility] OR;  Service: Neurosurgery;  Laterality: N/A;    Past Family History    Family History  Problem Relation Age of Onset   Heart attack Mother 4   Heart disease Father     Past Social History   Social History   Socioeconomic History   Marital status: Married    Spouse name: Keane Scrape. [place]   Number of children: 1   Years of education: 12   Highest education level: Not on file  Occupational History   Not on file  Tobacco Use   Smoking status: Some Days    Current packs/day: 0.25    Average packs/day: 0.3 packs/day for 50.0 years (12.5 ttl pk-yrs)    Types: Cigarettes   Smokeless tobacco: Never  Vaping Use   Vaping status: Never Used  Substance and Sexual Activity   Alcohol use: No   Drug use: Not Currently   Sexual activity: Not on file  Other Topics Concern   Not on file  Social History Narrative   Right handed   Lives at SNF   No caffeine use   Social Drivers of Corporate investment banker Strain: Low Risk  (09/18/2021)   Overall Financial Resource Strain (CARDIA)    Difficulty of Paying Living Expenses: Not hard at all  Food Insecurity: No Food Insecurity (09/18/2021)   Hunger Vital Sign    Worried About Running Out of Food in the Last Year: Never true    Ran Out of Food in the Last Year: Never true  Transportation Needs: No Transportation Needs (09/18/2021)   PRAPARE - Administrator, Civil Service (Medical): No    Lack of Transportation (Non-Medical): No  Physical Activity: Insufficiently Active (09/18/2021)   Exercise Vital Sign    Days of Exercise per Week: 5 days    Minutes of Exercise per Session: 20 min  Stress: No Stress Concern Present (09/18/2021)   [employer] of Occupational Health - Occupational Stress Questionnaire    Feeling of Stress : Not at all  Social Connections: Moderately Integrated (09/18/2021)   Social Connection and Isolation Panel [NHANES]    Frequency of Communication with Friends and Family: More than three times a week    Frequency of Social Gatherings with Friends and Family: More  than three times a week    Attends Religious Services: More than 4 times per year    Active Member of [employer] or Organizations: No    Attends Banker Meetings: Never    Marital Status: Married  Catering manager Violence: Not At Risk (09/18/2021)   Humiliation, Afraid, Rape, and Kick questionnaire    Fear of Current or Ex-Partner: No    Emotionally Abused: No    Physically Abused: No  Sexually Abused: No    Review of Systems   General: Negative for anorexia, weight loss, fever, chills, fatigue, weakness. ENT: Negative for hoarseness, difficulty swallowing , nasal congestion. CV: Negative for chest pain, angina, palpitations, dyspnea on exertion, peripheral edema.  Respiratory: Negative for dyspnea at rest, dyspnea on exertion, cough, sputum, wheezing.  GI: See history of present illness. GU:  Negative for dysuria, hematuria, urinary incontinence, urinary frequency, nocturnal urination.  Endo: Negative for unusual weight change.     Physical Exam   There were no vitals taken for this visit.   General: Well-nourished, well-developed in no acute distress.  Eyes: No icterus. Mouth: Oropharyngeal mucosa moist and pink , no lesions erythema or exudate. Lungs: Clear to auscultation bilaterally.  Heart: Regular rate and rhythm, no murmurs rubs or gallops.  Abdomen: Bowel sounds are normal, nontender, nondistended, no hepatosplenomegaly or masses,  no abdominal bruits or hernia , no rebound or guarding.  Rectal: ***  Extremities: No lower extremity edema. No clubbing or deformities. Neuro: Alert and oriented x 4   Skin: Warm and dry, no jaundice.   Psych: Alert and cooperative, normal mood and affect.  Labs   Lab Results  Component Value Date   NA 141 09/20/2022   CL 104 09/20/2022   K 3.8 09/20/2022   CO2 30 09/20/2022   BUN 15 09/20/2022   CREATININE 1.45 (H) 09/20/2022   GFRNONAA 51 (L) 09/20/2022   CALCIUM 8.4 (L) 09/20/2022   PHOS 4.2 09/15/2022   ALBUMIN  2.8 (L) 09/15/2022   GLUCOSE 90 09/20/2022   Lab Results  Component Value Date   ALT 7 09/15/2022   AST 11 (L) 09/15/2022   ALKPHOS 67 09/15/2022   BILITOT 0.9 09/15/2022   Lab Results  Component Value Date   WBC 7.9 05/22/2023   HGB 10.2 (L) 05/22/2023   HCT 32.6 (L) 05/22/2023   MCV 93.7 05/22/2023   PLT 186 05/22/2023   Lab Results  Component Value Date   IRON 112 09/15/2022   TIBC 150 (L) 09/15/2022   FERRITIN 243 09/15/2022   Lab Results  Component Value Date   VITAMINB12 2,290 (H) 09/14/2022   Lab Results  Component Value Date   FOLATE 9.0 09/14/2022    Imaging Studies   No results found.  Assessment       PLAN   ***   Trudie Fuse. Harles Lied, MHS, PA-C [facility]

## 2023-07-29 ENCOUNTER — Encounter: Payer: Self-pay | Admitting: Gastroenterology

## 2023-07-29 ENCOUNTER — Ambulatory Visit (INDEPENDENT_AMBULATORY_CARE_PROVIDER_SITE_OTHER): Admitting: Gastroenterology

## 2023-07-29 VITALS — BP 104/68 | HR 94 | Temp 97.6°F | Ht 79.0 in | Wt 223.0 lb

## 2023-07-29 DIAGNOSIS — K5909 Other constipation: Secondary | ICD-10-CM | POA: Diagnosis not present

## 2023-07-29 DIAGNOSIS — D649 Anemia, unspecified: Secondary | ICD-10-CM | POA: Diagnosis not present

## 2023-07-29 DIAGNOSIS — K5900 Constipation, unspecified: Secondary | ICD-10-CM

## 2023-07-29 DIAGNOSIS — D638 Anemia in other chronic diseases classified elsewhere: Secondary | ICD-10-CM

## 2023-07-29 NOTE — Patient Instructions (Signed)
 Please complete CBC, iron/tibc/ferritin and fax results to [phone]. Please monitor for persistent diarrhea, if continues more than 48 hours, complete Cdiff stool testing.

## 2023-09-01 ENCOUNTER — Ambulatory Visit: Admitting: Urology

## 2023-09-01 ENCOUNTER — Encounter: Payer: Self-pay | Admitting: Urology

## 2023-09-01 VITALS — BP 116/72 | HR 88

## 2023-09-01 DIAGNOSIS — N3000 Acute cystitis without hematuria: Secondary | ICD-10-CM

## 2023-09-01 DIAGNOSIS — N200 Calculus of kidney: Secondary | ICD-10-CM | POA: Diagnosis not present

## 2023-09-01 DIAGNOSIS — Z87440 Personal history of urinary (tract) infections: Secondary | ICD-10-CM | POA: Diagnosis not present

## 2023-09-01 DIAGNOSIS — R8290 Unspecified abnormal findings in urine: Secondary | ICD-10-CM

## 2023-09-01 DIAGNOSIS — N401 Enlarged prostate with lower urinary tract symptoms: Secondary | ICD-10-CM

## 2023-09-01 DIAGNOSIS — R351 Nocturia: Secondary | ICD-10-CM | POA: Diagnosis not present

## 2023-09-01 DIAGNOSIS — R339 Retention of urine, unspecified: Secondary | ICD-10-CM | POA: Diagnosis not present

## 2023-09-01 DIAGNOSIS — N138 Other obstructive and reflux uropathy: Secondary | ICD-10-CM

## 2023-09-01 DIAGNOSIS — R399 Unspecified symptoms and signs involving the genitourinary system: Secondary | ICD-10-CM

## 2023-09-01 LAB — URINALYSIS, ROUTINE W REFLEX MICROSCOPIC
Bilirubin, UA: NEGATIVE
Glucose, UA: NEGATIVE
Ketones, UA: NEGATIVE
Nitrite, UA: NEGATIVE
Specific Gravity, UA: 1.02 (ref 1.005–1.030)
Urobilinogen, Ur: 0.2 mg/dL (ref 0.2–1.0)
pH, UA: 6 (ref 5.0–7.5)

## 2023-09-01 LAB — BLADDER SCAN AMB NON-IMAGING: Scan Result: 249

## 2023-09-01 LAB — MICROSCOPIC EXAMINATION
RBC, Urine: >30 /HPF — AB (ref 0–2)
WBC, UA: >30 /HPF — AB (ref 0–5)

## 2023-09-01 MED ORDER — CEPHALEXIN 500 MG PO CAPS: 500.0000 mg | ORAL_CAPSULE | Freq: Two times a day (BID) | ORAL | 0 refills | Status: AC

## 2023-09-01 MED ORDER — CEPHALEXIN 500 MG PO CAPS: 500.0000 mg | ORAL_CAPSULE | Freq: Two times a day (BID) | ORAL | 0 refills | Status: DC

## 2023-09-01 NOTE — Progress Notes (Signed)
 Name: [name] DOB: Jun 04, 1945 MRN: [id number]  History of Present Illness: [name] is a 78 y.o. male who presents today for follow up visit at [facility]. He resides at [facility] (SNF) and is accompanied by [name] (tech). GU History includes: 1. 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 - Taking Flomax  (Tamsulosin ) 0.4 mg twice daily. 2. Recurrent UTIs. - Taking Trimethoprim  100 mg daily for UTI prophylaxis. 3. Kidney stone(s).                   4. Bilateral renal cysts.  At last visit with Dr. Claretta Croft on 07/13/2021: PVR = 345 ml.  Since last visit: > 09/14/2022 - 09/20/2022: Admitted for AKI, "likely prerenal AKI because of dehydration from vomiting and diarrhea.  Ultrasound of the kidneys did not show any hydronephrosis but showed bilateral simple renal cyst.  Kidney function has significantly improved with IV fluids."  > 08/20/2023: Cipro  500 mg 2x/day x7 days prescribed for UTI.   Today: He reports dysuria and bladder pain, which he states improved while he was on Cipro  recently. Reports weak intermittent urinary stream. Denies urinary hesitancy or straining to void. He reports ability to sense the need to urinate and is able to void volitionally; carries a urinal with him most of the time but states he typically just urinates into his diapers due to his ambulatory dysfunction. Denies urinary urgency, frequency, nocturia, gross hematuria.  Reports pain with defecation. Denies hematochezia. He takes a daily laxative and stool softener for constipation management / prevention.  Of note, he reports visual hallucinations as recently as Saturday 08/30/2023. He states he had those previously when he took Rapaflo (Silodosin).  Medications: Current Outpatient Medications  Medication Sig Dispense Refill   acetaminophen  (TYLENOL ) 325 MG tablet Take 650 mg by mouth 3 (three) times daily.     aspirin  81 MG  chewable tablet Chew 1 tablet (81 mg total) by mouth daily. 90 tablet 0   calcium  carbonate (TUMS - DOSED IN MG ELEMENTAL CALCIUM ) 500 MG chewable tablet Chew 1 tablet by mouth 3 (three) times daily with meals.     Carboxymethylcellulose Sodium (ARTIFICIAL TEARS OP) Apply 2 drops to eye every 2 (two) hours as needed.     cephALEXin  (KEFLEX ) 500 MG capsule Take 1 capsule (500 mg total) by mouth 2 (two) times daily for 7 days. 14 capsule 0   Cholecalciferol  (VITAMIN D3) 1.25 MG (50000 UT) TABS Take 50,000 Units by mouth every Wednesday.     DULoxetine  HCl 60 MG CSDR Take 1 capsule by mouth daily.     ferrous sulfate  325 (65 FE) MG tablet Take 325 mg by mouth daily with breakfast.     levothyroxine  (SYNTHROID ) 150 MCG tablet TAKE 1 TABLET BY MOUTH DAILY BEFORE BREAKFAST 90 tablet 2   loperamide  (IMODIUM  A-D) 2 MG tablet Take 2 mg by mouth 4 (four) times daily as needed for diarrhea or loose stools.     Magnesium  Oxide 400 MG CAPS Take 2 capsules (800 mg total) by mouth daily.  0   midodrine (PROAMATINE) 10 MG tablet Take 10 mg by mouth 3 (three) times daily.     Multiple Vitamins-Minerals (PRESERVISION AREDS 2 PO) Take 1 capsule by mouth daily.     pantoprazole  (PROTONIX ) 40 MG tablet Take 1 tablet (40 mg total) by mouth 2 (two) times daily. 60 tablet 1   polyethylene glycol (MIRALAX  / GLYCOLAX ) 17 g packet Take 17  g by mouth daily. Hold for 24 hours if watery stools, or more than 3 stools daily. 28 each 5   pregabalin  (LYRICA ) 100 MG capsule Take 1 capsule (100 mg total) by mouth 2 (two) times daily. 60 capsule 5   senna (SENOKOT) 8.6 MG TABS tablet Take 1 tablet (8.6 mg total) by mouth daily. 120 tablet 0   simvastatin  (ZOCOR ) 20 MG tablet TAKE 1 TABLET BY MOUTH AT BEDTIME 90 tablet 0   sucralfate (CARAFATE) 1 g tablet Take 1 g by mouth 4 (four) times daily -  with meals and at bedtime.     tamsulosin  (FLOMAX ) 0.4 MG CAPS capsule Take 1 capsule (0.4 mg total) by mouth 2 (two) times daily. 30 capsule     triamcinolone  cream (KENALOG ) 0.1 % Apply 1 Application topically 2 (two) times daily.     trimethoprim  (TRIMPEX ) 100 MG tablet Take 1 tablet (100 mg total) by mouth at bedtime. 30 tablet 11   vitamin B-12 (CYANOCOBALAMIN ) 1000 MCG tablet Take 1,000 mcg by mouth daily.     No current facility-administered medications for this visit.    Allergies: Allergies  Allergen Reactions   Sulfa Antibiotics Other (See Comments)    Causes blisters and skin to peel from inside mouth   Hydrocodone     Misc. Sulfonamide Containing Compounds    Oxycodone      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   Nausea vomiting and diarrhea 09/14/2022   Restless leg syndrome    Past Surgical History:  Procedure Laterality Date   BACK SURGERY     Fusion 2005   BIOPSY  09/18/2022   Procedure: BIOPSY;  Surgeon: Sergio Dandy, MD;  Location: [facility] ENDOSCOPY;  Service: Gastroenterology;;   COLONOSCOPY WITH PROPOFOL  N/A 11/29/2022   Procedure: COLONOSCOPY WITH PROPOFOL ;  Surgeon: Vinetta Greening, DO;  Location: [facility] ENDO SUITE;  Service: Endoscopy;  Laterality: N/A;  145pm, asa 3, [place]   COLONOSCOPY WITH PROPOFOL  N/A 05/26/2023   Procedure: COLONOSCOPY WITH PROPOFOL ;  Surgeon: Vinetta Greening, DO;  Location: [facility] ENDO SUITE;  Service: Endoscopy;  Laterality: N/A;  8:15 am, asa 3   ESOPHAGOGASTRODUODENOSCOPY (EGD) WITH PROPOFOL  N/A 09/18/2022   Procedure: ESOPHAGOGASTRODUODENOSCOPY (EGD) WITH PROPOFOL ;  Surgeon: Sergio Dandy, MD;  Location: [facility] ENDOSCOPY;  Service: Gastroenterology;  Laterality: N/A;   Graves Disease     JOINT REPLACEMENT  1970   Right knee    LAMINECTOMY WITH POSTERIOR LATERAL ARTHRODESIS LEVEL 2 N/A  06/29/2021   Procedure: Laminectomy - Lumbar one-two, Lumbar two-three - redo with posterior lateral fusion with pedicle screws;  Surgeon: Agustina Aldrich, MD;  Location: [facility] OR;  Service: Neurosurgery;  Laterality: N/A;   NECK SURGERY     POSTERIOR CERVICAL FUSION/FORAMINOTOMY  12/05/2011   Procedure: POSTERIOR CERVICAL FUSION/FORAMINOTOMY LEVEL 4;  Surgeon: Augustine Blocker, MD;  Location: [facility] NEURO ORS;  Service: Neurosurgery;  Laterality: N/A;  Posterior Cervical fixation Cervical four-Seven,Cervical Decompresson Cervical three   THORACIC DISCECTOMY N/A 01/08/2021   Procedure: Thoracic One-Two, Thoracic Two-Three, Thoracic Eleven-Twelve, Thoracic Twelve-Lumbar One Laminectomy;  Surgeon: Agustina Aldrich, MD;  Location: [facility] OR;  Service: Neurosurgery;  Laterality: N/A;   Family History  Problem Relation Age of Onset   Heart attack Mother 84   Heart disease Father    Social History   Socioeconomic History   Marital status: Married    Spouse name: Katherine K. Aubert   Number of children: 1   Years of education: 12   Highest education level: Not on file  Occupational History   Not on file  Tobacco Use   Smoking status: Some Days    Current packs/day: 0.25    Average packs/day: 0.3 packs/day for 50.0 years (12.5 ttl pk-yrs)    Types: Cigarettes   Smokeless tobacco: Never  Vaping Use   Vaping status: Never Used  Substance and Sexual Activity   Alcohol use: No   Drug use: Not Currently   Sexual activity: Not on file  Other Topics Concern   Not on file  Social History Narrative   Right handed   Lives at SNF   No caffeine use   Social Drivers of Corporate investment banker Strain: Low Risk  (09/18/2021)   Overall Financial Resource Strain (CARDIA)    Difficulty of Paying Living Expenses: Not hard at all  Food Insecurity: No Food Insecurity (09/18/2021)   Hunger Vital Sign    Worried About Running Out of Food in the Last Year: Never true    Ran Out of Food in the Last Year: Never true   Transportation Needs: No Transportation Needs (09/18/2021)   PRAPARE - Administrator, Civil Service (Medical): No    Lack of Transportation (Non-Medical): No  Physical Activity: Insufficiently Active (09/18/2021)   Exercise Vital Sign    Days of Exercise per Week: 5 days    Minutes of Exercise per Session: 20 min  Stress: No Stress Concern Present (09/18/2021)   [employer] of Occupational Health - Occupational Stress Questionnaire    Feeling of Stress : Not at all  Social Connections: Moderately Integrated (09/18/2021)   Social Connection and Isolation Panel [NHANES]    Frequency of Communication with Friends and Family: More than three times a week    Frequency of Social Gatherings with Friends and Family: More than three times a week    Attends Religious Services: More than 4 times per year    Active Member of [employer] or Organizations: No    Attends Banker Meetings: Never    Marital Status: Married  Catering manager Violence: Not At Risk (09/18/2021)   Humiliation, Afraid, Rape, and Kick questionnaire    Fear of Current or Ex-Partner: No    Emotionally Abused: No    Physically Abused: No    Sexually Abused: No    Review of Systems Constitutional: Patient reports fatigue  lntegumentary: Patient denies any rashes or pruritus Cardiovascular: Patient denies chest pain or syncope Respiratory: Patient denies shortness of breath Gastrointestinal: As per HPI Musculoskeletal: Patient denies muscle cramps or weakness Neurologic: Patient reports recent headaches  GU: As per HPI.  OBJECTIVE Vitals:   09/01/23 1302  BP: 116/72  Pulse: 88   There is no height or weight on file to calculate BMI.  Physical Examination Constitutional: No obvious distress; patient is non-toxic appearing  Cardiovascular: No visible lower extremity edema.  Respiratory: The patient does not have audible wheezing/stridor; respirations do not appear labored  Gastrointestinal:  Abdomen non-distended Musculoskeletal: Normal ROM of UEs  Skin: No obvious rashes/open sores  Neurologic: CN 2-12 grossly intact Psychiatric: Answered questions appropriately, mildly tangential  Hematologic/Lymphatic/Immunologic: No  obvious bruises or sites of spontaneous bleeding  Urine microscopy: >30 WBC/hpf, >30 RBC/hpf, many bacteria PVR: 249 ml  ASSESSMENT Acute cystitis without hematuria - Plan: Urinalysis, Routine w reflex microscopic, BLADDER SCAN AMB NON-IMAGING, Urine Culture, cephALEXin  (KEFLEX ) 500 MG capsule, DISCONTINUED: cephALEXin  (KEFLEX ) 500 MG capsule  Benign prostatic hyperplasia with urinary obstruction - Plan: Urinalysis, Routine w reflex microscopic, BLADDER SCAN AMB NON-IMAGING, Urine Culture, US  RENAL, cephALEXin  (KEFLEX ) 500 MG capsule, DISCONTINUED: cephALEXin  (KEFLEX ) 500 MG capsule  Kidney stones - Plan: Urine Culture, US  RENAL, cephALEXin  (KEFLEX ) 500 MG capsule, DISCONTINUED: cephALEXin  (KEFLEX ) 500 MG capsule  Nocturia - Plan: Urine Culture, cephALEXin  (KEFLEX ) 500 MG capsule, DISCONTINUED: cephALEXin  (KEFLEX ) 500 MG capsule  Incomplete bladder emptying - Plan: Urine Culture, US  RENAL, cephALEXin  (KEFLEX ) 500 MG capsule, DISCONTINUED: cephALEXin  (KEFLEX ) 500 MG capsule  Abnormal urinalysis - Plan: Urine Culture, cephALEXin  (KEFLEX ) 500 MG capsule, DISCONTINUED: cephALEXin  (KEFLEX ) 500 MG capsule  UTI symptoms - Plan: Urine Culture, cephALEXin  (KEFLEX ) 500 MG capsule, DISCONTINUED: cephALEXin  (KEFLEX ) 500 MG capsule  Abnormal UA. Will check urine culture and treat empirically with Keflex  while awaiting culture results and sensitivities. He is a mildly questionable historian; unclear if he may be mildly confused related to acute UTI.  His PVR remains elevated however it is improved compared to prior. We discussed how incomplete bladder emptying secondary to BPH/BOO can contribute to increased UTI risk. Due to report of prior adverse side effects with Rapaflo,  will continue Flomax  (Tamsulosin ) 0.4 mg twice daily for BPH. For further evaluation advised RUS to screen for hydronephrosis and cystoscopy to assess prostate / determine if he may be a surgical candidate for bladder outlet procedure.   Will continue Trimethoprim  100 mg daily for UTI prophylaxis for now; may change acute and/or long term antibiotic based on urine culture result.   He was advised to go to the ER if He develops fever >100.5 F, uncontrollable pain, or other significantly concerning symptoms.  Patient verbalized understanding of and agreement with current plan. All questions were answered.  PLAN Advised the following: 1. Urine culture.  2. Keflex  (Cephalexin ) 500 mg 2x/day for 7 days. 3. RUS within 1 week.  4. Continue Flomax  (Tamsulosin ) 0.4 mg twice daily. 5. Continue Trimethoprim  100 mg daily. 6. Continue daily laxative + stool softener for constipation management / prevention. 7. Return for 1st available cystoscopy with any urology MD.  Orders Placed This Encounter  Procedures   Urine Culture   US  RENAL    Standing Status:   Future    Expected Date:   09/01/2023    Expiration Date:   08/31/2024    Reason for Exam (SYMPTOM  OR DIAGNOSIS REQUIRED):   kidney stone known or suspected    Preferred imaging location?:   [facility]   Urinalysis, Routine w reflex microscopic   BLADDER SCAN AMB NON-IMAGING   Total time spent caring for the patient today was over 30 minutes. This includes time spent on the date of the visit reviewing the patient's chart before the visit, time spent during the visit, and time spent after the visit on documentation. Over 50% of that time was spent in face-to-face time with this patient for direct counseling. E&M based on time and complexity of medical decision making.  It has been explained that the patient is to follow regularly with their PCP in addition to all other providers involved in their care and to follow instructions provided by  these respective offices. Patient advised to contact urology clinic  if any urologic-pertaining questions, concerns, new symptoms or problems arise in the interim period.  There are no Patient Instructions on file for this visit.  Electronically signed by:  Lauretta Ponto, FNP   09/01/23    2:05 PM

## 2023-09-04 ENCOUNTER — Ambulatory Visit (HOSPITAL_COMMUNITY)

## 2023-09-06 LAB — URINE CULTURE

## 2023-09-10 ENCOUNTER — Other Ambulatory Visit: Payer: Self-pay | Admitting: Urology

## 2023-09-10 ENCOUNTER — Ambulatory Visit: Payer: Self-pay | Admitting: Urology

## 2023-09-10 DIAGNOSIS — N3000 Acute cystitis without hematuria: Secondary | ICD-10-CM

## 2023-09-10 DIAGNOSIS — R8279 Other abnormal findings on microbiological examination of urine: Secondary | ICD-10-CM

## 2023-09-10 MED ORDER — NITROFURANTOIN MONOHYD MACRO 100 MG PO CAPS: 100.0000 mg | ORAL_CAPSULE | Freq: Two times a day (BID) | ORAL | 0 refills | Status: AC

## 2023-09-10 NOTE — Telephone Encounter (Signed)
-----   Message from Lauretta Ponto sent at 09/10/2023  9:54 AM EDT ----- Please let pt know urine culture was positive and resistant to Keflex  which was prescribed empirically at the time of his visit on 09/01/2023. I'm sending in Macrobid  (Nitrofurantoin ) 100 mg 2x/day for 7 days based on culture sensitivities.

## 2023-09-10 NOTE — Telephone Encounter (Signed)
 Tried calling the nursing staff at [facility] with no answer.

## 2023-09-10 NOTE — Progress Notes (Signed)
 Please let pt know urine culture was positive and resistant to Keflex  which was prescribed empirically at the time of his visit on 09/01/2023. I'm sending in Macrobid  (Nitrofurantoin ) 100 mg 2x/day for 7 days based on culture sensitivities.

## 2023-09-12 ENCOUNTER — Ambulatory Visit (HOSPITAL_COMMUNITY)
Admission: RE | Admit: 2023-09-12 | Discharge: 2023-09-12 | Disposition: A | Discharge status: Home / Self Care | Source: Ambulatory Visit | Attending: Urology | Admitting: Urology

## 2023-09-12 DIAGNOSIS — R339 Retention of urine, unspecified: Secondary | ICD-10-CM | POA: Diagnosis present

## 2023-09-12 DIAGNOSIS — N401 Enlarged prostate with lower urinary tract symptoms: Secondary | ICD-10-CM | POA: Diagnosis present

## 2023-09-12 DIAGNOSIS — N138 Other obstructive and reflux uropathy: Secondary | ICD-10-CM | POA: Insufficient documentation

## 2023-09-12 DIAGNOSIS — N200 Calculus of kidney: Secondary | ICD-10-CM | POA: Insufficient documentation

## 2023-10-02 ENCOUNTER — Other Ambulatory Visit: Payer: Self-pay | Admitting: Urology

## 2023-10-02 DIAGNOSIS — N390 Urinary tract infection, site not specified: Secondary | ICD-10-CM

## 2023-10-02 MED ORDER — CIPROFLOXACIN HCL 500 MG PO TABS: 500.0000 mg | ORAL_TABLET | Freq: Two times a day (BID) | ORAL | 0 refills | Status: AC

## 2023-10-02 NOTE — Telephone Encounter (Signed)
-----   Message from Lauretta Ponto sent at 10/02/2023 12:16 PM EDT ----- 09/22/2023: Outside urine PCR positive for E. coli predominantly. Will treat with Cipro  per sensitivity report recommendations. ----- Message ----- From: Dyke Glasser, CMA Sent: 10/02/2023   9:03 AM EDT To: Trent Frizzle, MD; Lauretta Ponto, FNP  Sarah he has been seeing you but is scheduled for appt w/ Dahlstedt so FYI and advise if needed

## 2023-10-02 NOTE — Telephone Encounter (Signed)
 Attempted to call pt and wife to give results and confirm pharmacy neither phone call was answered and unable to lvm

## 2023-10-02 NOTE — Progress Notes (Signed)
 09/22/2023: Outside urine PCR positive for E. coli predominantly. Will treat with Cipro  per sensitivity report recommendations.

## 2023-10-06 NOTE — Progress Notes (Signed)
 H&P   History of Present Illness:  This 78 year old male comes in today for follow-up.  Previously, he has seen Lauraine Oz, NP.  He has BPH with lower urinary tract symptoms.  Additionally, he has recurrent urinary tract infections.  Most recently treated for a multidrug-resistant E. coli with fosfomycin.  He has had renal ultrasound performed in late May of this year revealing bilateral renal cysts, no hydronephrosis, debris layering dependently within the bladder.  He also has recurrent microscopic hematuria. 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   Nausea vomiting and diarrhea 09/14/2022   Restless leg syndrome     Past Surgical History:  Procedure Laterality Date   BACK SURGERY     Fusion 2005   BIOPSY  09/18/2022   Procedure: BIOPSY;  Surgeon: Shila Gustav GAILS, MD;  Location: [facility] ENDOSCOPY;  Service: Gastroenterology;;   COLONOSCOPY WITH PROPOFOL  N/A 11/29/2022   Procedure: COLONOSCOPY WITH PROPOFOL ;  Surgeon: Cindie Carlin POUR, DO;  Location: [facility] ENDO SUITE;  Service: Endoscopy;  Laterality: N/A;  145pm, asa 3, [place]   COLONOSCOPY WITH PROPOFOL  N/A 05/26/2023   Procedure: COLONOSCOPY WITH PROPOFOL ;  Surgeon: Cindie Carlin POUR, DO;  Location: [facility] ENDO SUITE;  Service: Endoscopy;  Laterality: N/A;  8:15 am, asa 3   ESOPHAGOGASTRODUODENOSCOPY (EGD) WITH PROPOFOL  N/A 09/18/2022   Procedure: ESOPHAGOGASTRODUODENOSCOPY (EGD) WITH PROPOFOL ;  Surgeon: Shila Gustav GAILS, MD;  Location: [facility] ENDOSCOPY;  Service: Gastroenterology;  Laterality: N/A;   Graves Disease     JOINT REPLACEMENT  1970   Right knee    LAMINECTOMY WITH POSTERIOR LATERAL ARTHRODESIS LEVEL 2 N/A 06/29/2021   Procedure: Laminectomy  - Lumbar one-two, Lumbar two-three - redo with posterior lateral fusion with pedicle screws;  Surgeon: Louis Shove, MD;  Location: [facility] OR;  Service: Neurosurgery;  Laterality: N/A;   NECK SURGERY     POSTERIOR CERVICAL FUSION/FORAMINOTOMY  12/05/2011   Procedure: POSTERIOR CERVICAL FUSION/FORAMINOTOMY LEVEL 4;  Surgeon: Darina MALVA Boehringer, MD;  Location: [facility] NEURO ORS;  Service: Neurosurgery;  Laterality: N/A;  Posterior Cervical fixation Cervical four-Seven,Cervical Decompresson Cervical three   THORACIC DISCECTOMY N/A 01/08/2021   Procedure: Thoracic One-Two, Thoracic Two-Three, Thoracic Eleven-Twelve, Thoracic Twelve-Lumbar One Laminectomy;  Surgeon: Louis Shove, MD;  Location: [facility] OR;  Service: Neurosurgery;  Laterality: N/A;    Home Medications:  Allergies as of 10/07/2023       Reactions   Sulfa Antibiotics Other (See Comments)   Causes blisters and skin to peel from inside mouth   Hydrocodone     Misc. Sulfonamide Containing Compounds    Oxycodone          Medication List        Accurate as of October 06, 2023 11:54 AM. If you have any questions, ask your nurse or doctor.          acetaminophen  325 MG tablet Commonly known as: TYLENOL  Take 650 mg by mouth 3 (three) times daily.   ARTIFICIAL TEARS OP Apply 2 drops to eye every 2 (two) hours as needed.   aspirin  81 MG chewable tablet Chew 1 tablet (81 mg total) by mouth daily.   calcium  carbonate 500  MG chewable tablet Commonly known as: TUMS - dosed in mg elemental calcium  Chew 1 tablet by mouth 3 (three) times daily with meals.   ciprofloxacin  500 MG tablet Commonly known as: CIPRO  Take 1 tablet (500 mg total) by mouth 2 (two) times daily for 7 days.   cyanocobalamin  1000 MCG tablet Commonly known as: VITAMIN B12 Take 1,000 mcg by mouth daily.   DULoxetine  HCl 60 MG Csdr Take 1 capsule by mouth daily.   ferrous sulfate  325 (65 FE) MG tablet Take 325 mg by mouth daily with breakfast.   levothyroxine  150 MCG  tablet Commonly known as: SYNTHROID  TAKE 1 TABLET BY MOUTH DAILY BEFORE BREAKFAST   loperamide  2 MG tablet Commonly known as: IMODIUM  A-D Take 2 mg by mouth 4 (four) times daily as needed for diarrhea or loose stools.   Magnesium  Oxide 400 MG Caps Take 2 capsules (800 mg total) by mouth daily.   midodrine 10 MG tablet Commonly known as: PROAMATINE Take 10 mg by mouth 3 (three) times daily.   pantoprazole  40 MG tablet Commonly known as: PROTONIX  Take 1 tablet (40 mg total) by mouth 2 (two) times daily.   polyethylene glycol 17 g packet Commonly known as: MIRALAX  / GLYCOLAX  Take 17 g by mouth daily. Hold for 24 hours if watery stools, or more than 3 stools daily.   pregabalin  100 MG capsule Commonly known as: Lyrica  Take 1 capsule (100 mg total) by mouth 2 (two) times daily.   PRESERVISION AREDS 2 PO Take 1 capsule by mouth daily.   senna 8.6 MG Tabs tablet Commonly known as: SENOKOT Take 1 tablet (8.6 mg total) by mouth daily.   simvastatin  20 MG tablet Commonly known as: ZOCOR  TAKE 1 TABLET BY MOUTH AT BEDTIME   sucralfate 1 g tablet Commonly known as: CARAFATE Take 1 g by mouth 4 (four) times daily -  with meals and at bedtime.   tamsulosin  0.4 MG Caps capsule Commonly known as: FLOMAX  Take 1 capsule (0.4 mg total) by mouth 2 (two) times daily.   triamcinolone  cream 0.1 % Commonly known as: KENALOG  Apply 1 Application topically 2 (two) times daily.   trimethoprim  100 MG tablet Commonly known as: TRIMPEX  Take 1 tablet (100 mg total) by mouth at bedtime.   Vitamin D3 1.25 MG (50000 UT) Tabs Take 50,000 Units by mouth every Wednesday.        Allergies:  Allergies  Allergen Reactions   Sulfa Antibiotics Other (See Comments)    Causes blisters and skin to peel from inside mouth   Hydrocodone     Misc. Sulfonamide Containing Compounds    Oxycodone      Family History  Problem Relation Age of Onset   Heart attack Mother 24   Heart disease Father      Social History:  reports that he has been smoking cigarettes. He has a 12.5 pack-year smoking history. He has never used smokeless tobacco. He reports that he does not currently use drugs. He reports that he does not drink alcohol.  ROS: A complete review of systems was performed.  All systems are negative except for pertinent findings as noted.  Physical Exam:  Vital signs in last 24 hours: There were no vitals taken for this visit. Constitutional:  Alert and oriented, No acute distress Cardiovascular: Regular rate  Respiratory: Normal respiratory effort GI: Abdomen is soft, nontender, nondistended, no abdominal masses. No CVAT.  Genitourinary: Normal male phallus, testes are descended bilaterally and non-tender and without masses, scrotum is  normal in appearance without lesions or masses, perineum is normal on inspection. Lymphatic: No lymphadenopathy Neurologic: Grossly intact, no focal deficits Psychiatric: Normal mood and affect  I have reviewed prior pt notes  I have reviewed notes from referring/previous physicians  I have reviewed urinalysis results  I have independently reviewed prior imaging  I have reviewed prior PSA results  I have reviewed prior urine culture  Cystoscopy Procedure Note:  Indication: Microscopic hematuria  After informed consent and discussion of the procedure and its risks, ITAY MELLA was positioned and prepped in the standard fashion.  Cystoscopy was performed with a flexible cystoscope.   Findings: Urethra:*** Prostate:*** Bladder neck:*** Ureteral orifices:*** Bladder:***  The patient tolerated the procedure well.    Impression/Assessment:  ***  Plan:  ***

## 2023-10-07 ENCOUNTER — Ambulatory Visit: Admitting: Urology

## 2023-10-07 VITALS — BP 102/69 | HR 86

## 2023-10-07 DIAGNOSIS — N401 Enlarged prostate with lower urinary tract symptoms: Secondary | ICD-10-CM

## 2023-10-07 DIAGNOSIS — R3129 Other microscopic hematuria: Secondary | ICD-10-CM | POA: Diagnosis not present

## 2023-10-07 DIAGNOSIS — N3000 Acute cystitis without hematuria: Secondary | ICD-10-CM

## 2023-10-07 LAB — URINALYSIS, ROUTINE W REFLEX MICROSCOPIC
Bilirubin, UA: NEGATIVE
Glucose, UA: NEGATIVE
Ketones, UA: NEGATIVE
Nitrite, UA: NEGATIVE
Specific Gravity, UA: 1.015 (ref 1.005–1.030)
Urobilinogen, Ur: 0.2 mg/dL (ref 0.2–1.0)
pH, UA: 6.5 (ref 5.0–7.5)

## 2023-10-07 LAB — MICROSCOPIC EXAMINATION
RBC, Urine: >30 /HPF — AB (ref 0–2)
WBC, UA: >30 /HPF — AB (ref 0–5)

## 2023-10-07 NOTE — Progress Notes (Signed)
 Order received to complete in and out catherization for urine sample. Patient was cleaned and prepped in a sterle fashion with Betadinex3  A 14 fr catheter foley was inserted. Urine return was note 45 ml. Urine cloudy yellow in color. Catheter was then removed. Patient tolerated procedure with no complications were noted.  Performed by Exie LANES CMA  Urine sent for routine urinalysis

## 2023-10-11 LAB — URINE CULTURE

## 2023-10-20 NOTE — Progress Notes (Signed)
 H&P   History of Present Illness:  This 78 year old male comes in today for follow-up.  Previously, he has seen Lauraine Oz, NP.  He has BPH with lower urinary tract symptoms.  Additionally, he has recurrent urinary tract infections.  Most recently treated for a multidrug-resistant E. coli with Cipro .  He has had renal ultrasound performed in late May of this year revealing bilateral renal cysts, no hydronephrosis, debris layering dependently within the bladder.  He also has recurrent microscopic hematuria.  7.8.2025: Bernabe is here for cysto. At his visit 2 weeks ago he had a MDR e.coli--treated w/ monurol. 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   Nausea vomiting and diarrhea 09/14/2022   Restless leg syndrome     Past Surgical History:  Procedure Laterality Date   BACK SURGERY     Fusion 2005   BIOPSY  09/18/2022   Procedure: BIOPSY;  Surgeon: Shila Gustav GAILS, MD;  Location: [facility] ENDOSCOPY;  Service: Gastroenterology;;   COLONOSCOPY WITH PROPOFOL  N/A 11/29/2022   Procedure: COLONOSCOPY WITH PROPOFOL ;  Surgeon: Cindie Carlin POUR, DO;  Location: [facility] ENDO SUITE;  Service: Endoscopy;  Laterality: N/A;  145pm, asa 3, [place] [facility]   COLONOSCOPY WITH PROPOFOL  N/A 05/26/2023   Procedure: COLONOSCOPY WITH PROPOFOL ;  Surgeon: Cindie Carlin POUR, DO;  Location: [facility] ENDO SUITE;  Service: Endoscopy;  Laterality: N/A;  8:15 am, asa 3   ESOPHAGOGASTRODUODENOSCOPY (EGD) WITH PROPOFOL  N/A 09/18/2022   Procedure: ESOPHAGOGASTRODUODENOSCOPY (EGD) WITH PROPOFOL ;  Surgeon: Shila Gustav GAILS, MD;  Location: [facility] ENDOSCOPY;  Service: Gastroenterology;  Laterality: N/A;   Graves Disease     JOINT REPLACEMENT  1970   Right  knee    LAMINECTOMY WITH POSTERIOR LATERAL ARTHRODESIS LEVEL 2 N/A 06/29/2021   Procedure: Laminectomy - Lumbar one-two, Lumbar two-three - redo with posterior lateral fusion with pedicle screws;  Surgeon: Louis Shove, MD;  Location: [facility] OR;  Service: Neurosurgery;  Laterality: N/A;   NECK SURGERY     POSTERIOR CERVICAL FUSION/FORAMINOTOMY  12/05/2011   Procedure: POSTERIOR CERVICAL FUSION/FORAMINOTOMY LEVEL 4;  Surgeon: Darina MALVA Boehringer, MD;  Location: [facility] NEURO ORS;  Service: Neurosurgery;  Laterality: N/A;  Posterior Cervical fixation Cervical four-Seven,Cervical Decompresson Cervical three   THORACIC DISCECTOMY N/A 01/08/2021   Procedure: Thoracic One-Two, Thoracic Two-Three, Thoracic Eleven-Twelve, Thoracic Twelve-Lumbar One Laminectomy;  Surgeon: Louis Shove, MD;  Location: [facility] OR;  Service: Neurosurgery;  Laterality: N/A;    Home Medications:  Allergies as of 10/21/2023       Reactions   Sulfa Antibiotics Other (See Comments)   Causes blisters and skin to peel from inside mouth   Hydrocodone     Misc. Sulfonamide Containing Compounds    Oxycodone          Medication List        Accurate as of October 20, 2023  6:32 AM. If you have any questions, ask your nurse or doctor.          acetaminophen  325 MG tablet Commonly known as: TYLENOL  Take 650 mg by mouth 3 (three) times daily.   ARTIFICIAL TEARS OP Apply 2 drops to eye every 2 (two) hours as needed.  aspirin  81 MG chewable tablet Chew 1 tablet (81 mg total) by mouth daily.   calcium  carbonate 500 MG chewable tablet Commonly known as: TUMS - dosed in mg elemental calcium  Chew 1 tablet by mouth 3 (three) times daily with meals.   cyanocobalamin  1000 MCG tablet Commonly known as: VITAMIN B12 Take 1,000 mcg by mouth daily.   DULoxetine  HCl 60 MG Csdr Take 1 capsule by mouth daily.   ferrous sulfate  325 (65 FE) MG tablet Take 325 mg by mouth daily with breakfast.   levothyroxine  150 MCG tablet Commonly known as:  SYNTHROID  TAKE 1 TABLET BY MOUTH DAILY BEFORE BREAKFAST   loperamide  2 MG tablet Commonly known as: IMODIUM  A-D Take 2 mg by mouth 4 (four) times daily as needed for diarrhea or loose stools.   Magnesium  Oxide 400 MG Caps Take 2 capsules (800 mg total) by mouth daily.   midodrine 10 MG tablet Commonly known as: PROAMATINE Take 10 mg by mouth 3 (three) times daily.   pantoprazole  40 MG tablet Commonly known as: PROTONIX  Take 1 tablet (40 mg total) by mouth 2 (two) times daily.   polyethylene glycol 17 g packet Commonly known as: MIRALAX  / GLYCOLAX  Take 17 g by mouth daily. Hold for 24 hours if watery stools, or more than 3 stools daily.   pregabalin  100 MG capsule Commonly known as: Lyrica  Take 1 capsule (100 mg total) by mouth 2 (two) times daily.   PRESERVISION AREDS 2 PO Take 1 capsule by mouth daily.   senna 8.6 MG Tabs tablet Commonly known as: SENOKOT Take 1 tablet (8.6 mg total) by mouth daily.   simvastatin  20 MG tablet Commonly known as: ZOCOR  TAKE 1 TABLET BY MOUTH AT BEDTIME   sucralfate 1 g tablet Commonly known as: CARAFATE Take 1 g by mouth 4 (four) times daily -  with meals and at bedtime.   tamsulosin  0.4 MG Caps capsule Commonly known as: FLOMAX  Take 1 capsule (0.4 mg total) by mouth 2 (two) times daily.   triamcinolone  cream 0.1 % Commonly known as: KENALOG  Apply 1 Application topically 2 (two) times daily.   trimethoprim  100 MG tablet Commonly known as: TRIMPEX  Take 1 tablet (100 mg total) by mouth at bedtime.   Vitamin D3 1.25 MG (50000 UT) Tabs Take 50,000 Units by mouth every Wednesday.        Allergies:  Allergies  Allergen Reactions   Sulfa Antibiotics Other (See Comments)    Causes blisters and skin to peel from inside mouth   Hydrocodone     Misc. Sulfonamide Containing Compounds    Oxycodone      Family History  Problem Relation Age of Onset   Heart attack Mother 47   Heart disease Father     Social History:  reports  that he has been smoking cigarettes. He has a 12.5 pack-year smoking history. He has never used smokeless tobacco. He reports that he does not currently use drugs. He reports that he does not drink alcohol.  ROS: A complete review of systems was performed.  All systems are negative except for pertinent findings as noted.  Physical Exam:  Vital signs in last 24 hours: There were no vitals taken for this visit. Constitutional:  Alert and oriented, No acute distress.  He is in wheelchair.  Unable to stand on his own. Cardiovascular: Regular rate  Respiratory: Normal respiratory effort Psychiatric: Normal mood and affect  Catheterized urine obtained today.  I have reviewed prior pt notes  I have reviewed  urinalysis results  I have independently reviewed prior imaging--renal ultrasound done recently, CT scan from June 2024, renal ultrasound images from last month  I have reviewed prior urine cultures  Cystoscopy Procedure Note:  Indication: ***  After informed consent and discussion of the procedure and its risks, BUEL MOLDER was positioned and prepped in the standard fashion.  Cystoscopy was performed with a flexible cystoscope.   Findings: Urethra:*** Prostate:*** Bladder neck:*** Ureteral orifices:*** Bladder:***  The patient tolerated the procedure well.

## 2023-10-21 ENCOUNTER — Ambulatory Visit (INDEPENDENT_AMBULATORY_CARE_PROVIDER_SITE_OTHER): Admitting: Urology

## 2023-10-21 VITALS — BP 121/75 | HR 65

## 2023-10-21 DIAGNOSIS — R3129 Other microscopic hematuria: Secondary | ICD-10-CM | POA: Diagnosis not present

## 2023-10-21 DIAGNOSIS — Z87440 Personal history of urinary (tract) infections: Secondary | ICD-10-CM | POA: Diagnosis not present

## 2023-10-21 LAB — URINALYSIS, ROUTINE W REFLEX MICROSCOPIC
Bilirubin, UA: NEGATIVE
Glucose, UA: NEGATIVE
Ketones, UA: NEGATIVE
Nitrite, UA: NEGATIVE
Protein,UA: NEGATIVE
Specific Gravity, UA: <1.005 — ABNORMAL LOW (ref 1.005–1.030)
Urobilinogen, Ur: 0.2 mg/dL (ref 0.2–1.0)
pH, UA: 6 (ref 5.0–7.5)

## 2023-10-21 LAB — MICROSCOPIC EXAMINATION: WBC, UA: >30 /HPF — AB (ref 0–5)

## 2023-10-21 MED ORDER — CIPROFLOXACIN HCL 500 MG PO TABS
500.0000 mg | ORAL_TABLET | Freq: Once | ORAL | Status: AC
  Administered 2023-10-21: 500 mg via ORAL

## 2023-10-21 NOTE — Progress Notes (Signed)
 Order received to complete in and out catherization for urine sample. Patient was cleaned and prepped in a sterle fashion with Betadinex3  A 14 fr catheter foley was inserted. Urine return was note 400 ml. Urine cloudy light in color. Catheter was then removed. Patient tolerated procedure with no complications were noted.  Performed by Exie FU  Urine sent for routine urinalysis

## 2023-12-20 ENCOUNTER — Other Ambulatory Visit: Payer: Self-pay

## 2023-12-20 ENCOUNTER — Emergency Department (HOSPITAL_COMMUNITY)

## 2023-12-20 ENCOUNTER — Inpatient Hospital Stay (HOSPITAL_COMMUNITY)

## 2023-12-20 ENCOUNTER — Inpatient Hospital Stay (HOSPITAL_COMMUNITY)
Admission: EM | Admit: 2023-12-20 | Discharge: 2023-12-23 | DRG: 101 | Disposition: A | Discharge status: Skilled Nursing Facility | Source: Skilled Nursing Facility | Attending: Family Medicine | Admitting: Family Medicine

## 2023-12-20 DIAGNOSIS — E039 Hypothyroidism, unspecified: Secondary | ICD-10-CM | POA: Diagnosis present

## 2023-12-20 DIAGNOSIS — Z66 Do not resuscitate: Secondary | ICD-10-CM | POA: Diagnosis present

## 2023-12-20 DIAGNOSIS — Z79890 Hormone replacement therapy (postmenopausal): Secondary | ICD-10-CM

## 2023-12-20 DIAGNOSIS — I700 Atherosclerosis of aorta: Secondary | ICD-10-CM | POA: Diagnosis present

## 2023-12-20 DIAGNOSIS — Z8249 Family history of ischemic heart disease and other diseases of the circulatory system: Secondary | ICD-10-CM | POA: Diagnosis not present

## 2023-12-20 DIAGNOSIS — R4182 Altered mental status, unspecified: Secondary | ICD-10-CM | POA: Diagnosis present

## 2023-12-20 DIAGNOSIS — M1990 Unspecified osteoarthritis, unspecified site: Secondary | ICD-10-CM | POA: Diagnosis present

## 2023-12-20 DIAGNOSIS — R569 Unspecified convulsions: Principal | ICD-10-CM | POA: Diagnosis present

## 2023-12-20 DIAGNOSIS — Z885 Allergy status to narcotic agent status: Secondary | ICD-10-CM | POA: Diagnosis not present

## 2023-12-20 DIAGNOSIS — J449 Chronic obstructive pulmonary disease, unspecified: Secondary | ICD-10-CM | POA: Diagnosis present

## 2023-12-20 DIAGNOSIS — G8929 Other chronic pain: Secondary | ICD-10-CM | POA: Diagnosis present

## 2023-12-20 DIAGNOSIS — Z981 Arthrodesis status: Secondary | ICD-10-CM

## 2023-12-20 DIAGNOSIS — F32A Depression, unspecified: Secondary | ICD-10-CM | POA: Diagnosis present

## 2023-12-20 DIAGNOSIS — Z79899 Other long term (current) drug therapy: Secondary | ICD-10-CM | POA: Diagnosis not present

## 2023-12-20 DIAGNOSIS — H3530 Unspecified macular degeneration: Secondary | ICD-10-CM | POA: Diagnosis present

## 2023-12-20 DIAGNOSIS — F419 Anxiety disorder, unspecified: Secondary | ICD-10-CM | POA: Diagnosis present

## 2023-12-20 DIAGNOSIS — Z6841 Body Mass Index (BMI) 40.0 and over, adult: Secondary | ICD-10-CM | POA: Diagnosis not present

## 2023-12-20 DIAGNOSIS — F17210 Nicotine dependence, cigarettes, uncomplicated: Secondary | ICD-10-CM | POA: Diagnosis present

## 2023-12-20 DIAGNOSIS — Z7982 Long term (current) use of aspirin: Secondary | ICD-10-CM | POA: Diagnosis not present

## 2023-12-20 DIAGNOSIS — Z1624 Resistance to multiple antibiotics: Secondary | ICD-10-CM | POA: Diagnosis present

## 2023-12-20 DIAGNOSIS — G9340 Encephalopathy, unspecified: Secondary | ICD-10-CM | POA: Diagnosis not present

## 2023-12-20 DIAGNOSIS — N390 Urinary tract infection, site not specified: Secondary | ICD-10-CM | POA: Diagnosis present

## 2023-12-20 DIAGNOSIS — G2581 Restless legs syndrome: Secondary | ICD-10-CM | POA: Diagnosis present

## 2023-12-20 DIAGNOSIS — I10 Essential (primary) hypertension: Secondary | ICD-10-CM | POA: Diagnosis present

## 2023-12-20 DIAGNOSIS — I6523 Occlusion and stenosis of bilateral carotid arteries: Secondary | ICD-10-CM | POA: Diagnosis present

## 2023-12-20 DIAGNOSIS — K219 Gastro-esophageal reflux disease without esophagitis: Secondary | ICD-10-CM | POA: Diagnosis present

## 2023-12-20 DIAGNOSIS — R4189 Other symptoms and signs involving cognitive functions and awareness: Secondary | ICD-10-CM | POA: Diagnosis present

## 2023-12-20 DIAGNOSIS — B9620 Unspecified Escherichia coli [E. coli] as the cause of diseases classified elsewhere: Secondary | ICD-10-CM | POA: Diagnosis present

## 2023-12-20 DIAGNOSIS — Z96651 Presence of right artificial knee joint: Secondary | ICD-10-CM | POA: Diagnosis present

## 2023-12-20 DIAGNOSIS — Z882 Allergy status to sulfonamides status: Secondary | ICD-10-CM

## 2023-12-20 DIAGNOSIS — E66813 Obesity, class 3: Secondary | ICD-10-CM | POA: Diagnosis present

## 2023-12-20 DIAGNOSIS — R4781 Slurred speech: Secondary | ICD-10-CM | POA: Diagnosis present

## 2023-12-20 DIAGNOSIS — R339 Retention of urine, unspecified: Secondary | ICD-10-CM | POA: Diagnosis not present

## 2023-12-20 DIAGNOSIS — D696 Thrombocytopenia, unspecified: Secondary | ICD-10-CM | POA: Diagnosis present

## 2023-12-20 DIAGNOSIS — J339 Nasal polyp, unspecified: Secondary | ICD-10-CM | POA: Diagnosis present

## 2023-12-20 DIAGNOSIS — Z87440 Personal history of urinary (tract) infections: Secondary | ICD-10-CM

## 2023-12-20 DIAGNOSIS — I9589 Other hypotension: Secondary | ICD-10-CM | POA: Diagnosis present

## 2023-12-20 LAB — COMPREHENSIVE METABOLIC PANEL WITH GFR
ALT: 10 U/L (ref 0–44)
AST: 14 U/L — ABNORMAL LOW (ref 15–41)
Albumin: 3.5 g/dL (ref 3.5–5.0)
Alkaline Phosphatase: 96 U/L (ref 38–126)
Anion gap: 11 (ref 5–15)
BUN: 30 mg/dL — ABNORMAL HIGH (ref 8–23)
CO2: 27 mmol/L (ref 22–32)
Calcium: 9.3 mg/dL (ref 8.9–10.3)
Chloride: 103 mmol/L (ref 98–111)
Creatinine, Ser: 0.84 mg/dL (ref 0.61–1.24)
GFR, Estimated: >60 mL/min (ref >60)
Glucose, Bld: 90 mg/dL (ref 70–99)
Potassium: 4 mmol/L (ref 3.5–5.1)
Sodium: 141 mmol/L (ref 135–145)
Total Bilirubin: 0.5 mg/dL (ref 0.0–1.2)
Total Protein: 6.9 g/dL (ref 6.5–8.1)

## 2023-12-20 LAB — CBC
HCT: 35.9 % — ABNORMAL LOW (ref 39.0–52.0)
Hemoglobin: 11.2 g/dL — ABNORMAL LOW (ref 13.0–17.0)
MCH: 30 pg (ref 26.0–34.0)
MCHC: 31.2 g/dL (ref 30.0–36.0)
MCV: 96.2 fL (ref 80.0–100.0)
Platelets: 134 K/uL — ABNORMAL LOW (ref 150–400)
RBC: 3.73 MIL/uL — ABNORMAL LOW (ref 4.22–5.81)
RDW: 17.1 % — ABNORMAL HIGH (ref 11.5–15.5)
WBC: 7.8 K/uL (ref 4.0–10.5)
nRBC: 0 % (ref 0.0–0.2)

## 2023-12-20 LAB — URINALYSIS, ROUTINE W REFLEX MICROSCOPIC
Bilirubin Urine: NEGATIVE
Glucose, UA: NEGATIVE mg/dL
Ketones, ur: NEGATIVE mg/dL
Nitrite: NEGATIVE
Protein, ur: 30 mg/dL — AB
Specific Gravity, Urine: 1.025 (ref 1.005–1.030)
WBC, UA: >50 WBC/hpf (ref 0–5)
pH: 5 (ref 5.0–8.0)

## 2023-12-20 LAB — BLOOD GAS, VENOUS
Acid-Base Excess: 3.9 mmol/L — ABNORMAL HIGH (ref 0.0–2.0)
Bicarbonate: 31.3 mmol/L — ABNORMAL HIGH (ref 20.0–28.0)
Drawn by: 8648
O2 Saturation: 60.3 %
Patient temperature: 36.2
pCO2, Ven: 56 mmHg (ref 44–60)
pH, Ven: 7.35 (ref 7.25–7.43)
pO2, Ven: 34 mmHg (ref 32–45)

## 2023-12-20 LAB — RAPID URINE DRUG SCREEN, HOSP PERFORMED
Amphetamines: NOT DETECTED
Barbiturates: NOT DETECTED
Benzodiazepines: NOT DETECTED
Cocaine: NOT DETECTED
Opiates: NOT DETECTED
Tetrahydrocannabinol: NOT DETECTED

## 2023-12-20 LAB — DIFFERENTIAL
Abs Immature Granulocytes: 0.03 K/uL (ref 0.00–0.07)
Basophils Absolute: 0 K/uL (ref 0.0–0.1)
Basophils Relative: 0 %
Eosinophils Absolute: 0 K/uL (ref 0.0–0.5)
Eosinophils Relative: 1 %
Immature Granulocytes: 0 %
Lymphocytes Relative: 10 %
Lymphs Abs: 0.8 K/uL (ref 0.7–4.0)
Monocytes Absolute: 0.8 K/uL (ref 0.1–1.0)
Monocytes Relative: 11 %
Neutro Abs: 6.1 K/uL (ref 1.7–7.7)
Neutrophils Relative %: 78 %

## 2023-12-20 LAB — PROTIME-INR
INR: 1 (ref 0.8–1.2)
Prothrombin Time: 13.9 s (ref 11.4–15.2)

## 2023-12-20 LAB — CBG MONITORING, ED: Glucose-Capillary: 81 mg/dL (ref 70–99)

## 2023-12-20 LAB — MAGNESIUM: Magnesium: 1.9 mg/dL (ref 1.7–2.4)

## 2023-12-20 LAB — ETHANOL: Alcohol, Ethyl (B): <15 mg/dL (ref <15)

## 2023-12-20 LAB — APTT: aPTT: 32 s (ref 24–36)

## 2023-12-20 MED ORDER — LORAZEPAM 2 MG/ML IJ SOLN: 4.0000 mg | INTRAMUSCULAR | Status: DC | PRN

## 2023-12-20 MED ORDER — LEVETIRACETAM (KEPPRA) 500 MG/5 ML ADULT IV PUSH
500.0000 mg | Freq: Two times a day (BID) | INTRAVENOUS | Status: DC
  Administered 2023-12-20: 500 mg via INTRAVENOUS
  Filled 2023-12-20 (×3): qty 5

## 2023-12-20 MED ORDER — SODIUM CHLORIDE 0.9 % IV BOLUS
500.0000 mL | Freq: Once | INTRAVENOUS | Status: AC
  Administered 2023-12-20: 500 mL via INTRAVENOUS

## 2023-12-20 MED ORDER — ONDANSETRON HCL 4 MG PO TABS: 4.0000 mg | ORAL_TABLET | Freq: Four times a day (QID) | ORAL | Status: DC | PRN

## 2023-12-20 MED ORDER — IOHEXOL 350 MG/ML SOLN
100.0000 mL | Freq: Once | INTRAVENOUS | Status: AC | PRN
  Administered 2023-12-20: 100 mL via INTRAVENOUS

## 2023-12-20 MED ORDER — LEVETIRACETAM (KEPPRA) 500 MG/5 ML ADULT IV PUSH
1500.0000 mg | Freq: Once | INTRAVENOUS | Status: AC
  Administered 2023-12-20: 1500 mg via INTRAVENOUS
  Filled 2023-12-20: qty 15

## 2023-12-20 MED ORDER — PANTOPRAZOLE SODIUM 40 MG IV SOLR
40.0000 mg | Freq: Two times a day (BID) | INTRAVENOUS | Status: DC
  Administered 2023-12-20 – 2023-12-22 (×4): 40 mg via INTRAVENOUS
  Filled 2023-12-20 (×4): qty 10

## 2023-12-20 MED ORDER — ONDANSETRON HCL 4 MG/2ML IJ SOLN: 4.0000 mg | Freq: Four times a day (QID) | INTRAMUSCULAR | Status: DC | PRN

## 2023-12-20 MED ORDER — ORAL CARE MOUTH RINSE
15.0000 mL | OROMUCOSAL | Status: DC
  Administered 2023-12-20: 15 mL via OROMUCOSAL

## 2023-12-20 MED ORDER — ORAL CARE MOUTH RINSE: 15.0000 mL | OROMUCOSAL | Status: DC | PRN

## 2023-12-20 MED ORDER — SODIUM CHLORIDE 0.9 % IV SOLN
75.0000 mL/h | INTRAVENOUS | Status: AC
  Administered 2023-12-21 (×2): 75 mL/h via INTRAVENOUS

## 2023-12-20 MED ORDER — LEVETIRACETAM 500 MG PO TABS
500.0000 mg | ORAL_TABLET | Freq: Two times a day (BID) | ORAL | Status: DC
  Administered 2023-12-21 – 2023-12-23 (×5): 500 mg via ORAL
  Filled 2023-12-20 (×5): qty 1

## 2023-12-20 MED ORDER — HYDRALAZINE HCL 20 MG/ML IJ SOLN: 10.0000 mg | INTRAMUSCULAR | Status: DC | PRN

## 2023-12-20 NOTE — Progress Notes (Signed)
 RECEIVED PATIENT ON THE UNIT.

## 2023-12-20 NOTE — ED Provider Notes (Signed)
 [facility] EMERGENCY DEPARTMENT AT [facility] Provider Note   CSN: [id number] Arrival date & time: 12/20/23  1332  An emergency department physician performed an initial assessment on this suspected stroke patient at 1338.  Patient presents with: Code Stroke   [name] is a 78 y.o. male.   Patient was at the nursing home and had 2 episodes where he had shaking in both arms and his eyes rolled back.  When he presented in the emergency department initially he presented as a code stroke  The history is provided by the EMS personnel. No language interpreter was used.  Altered Mental Status Presenting symptoms: disorientation   Severity:  Moderate Most recent episode:  Today Episode history:  Continuous Timing:  Constant Progression:  Waxing and waning Chronicity:  New Context: not alcohol use   Associated symptoms: no abdominal pain        Prior to Admission medications   Medication Sig Start Date End Date Taking? Authorizing Provider  acetaminophen  (TYLENOL ) 325 MG tablet Take 650 mg by mouth 3 (three) times daily.    [provider]  aspirin  81 MG chewable tablet Chew 1 tablet (81 mg total) by mouth daily. 09/04/21   Zarwolo, Gloria, FNP  calcium  carbonate (TUMS - DOSED IN MG ELEMENTAL CALCIUM ) 500 MG chewable tablet Chew 1 tablet by mouth 3 (three) times daily with meals.    [provider]  Carboxymethylcellulose Sodium (ARTIFICIAL TEARS OP) Apply 2 drops to eye every 2 (two) hours as needed.    [provider]  Cholecalciferol  (VITAMIN D3) 1.25 MG (50000 UT) TABS Take 50,000 Units by mouth every Wednesday.    [provider]  DULoxetine  HCl 60 MG CSDR Take 1 capsule by mouth daily.    [provider]  ferrous sulfate  325 (65 FE) MG tablet Take 325 mg by mouth daily with breakfast.    [provider]  levothyroxine  (SYNTHROID ) 150 MCG tablet TAKE 1 TABLET BY MOUTH DAILY BEFORE BREAKFAST 02/04/22    Zarwolo, Gloria, FNP  loperamide  (IMODIUM  A-D) 2 MG tablet Take 2 mg by mouth 4 (four) times daily as needed for diarrhea or loose stools.    [provider]  Magnesium  Oxide 400 MG CAPS Take 2 capsules (800 mg total) by mouth daily. 09/20/22   Adhikari, Amrit, MD  midodrine (PROAMATINE) 10 MG tablet Take 10 mg by mouth 3 (three) times daily. 05/09/23   [provider]  Multiple Vitamins-Minerals (PRESERVISION AREDS 2 PO) Take 1 capsule by mouth daily.    [provider]  pantoprazole  (PROTONIX ) 40 MG tablet Take 1 tablet (40 mg total) by mouth 2 (two) times daily. 09/20/22   Adhikari, Amrit, MD  polyethylene glycol (MIRALAX  / GLYCOLAX ) 17 g packet Take 17 g by mouth daily. Hold for 24 hours if watery stools, or more than 3 stools daily. 02/24/23   Ezzard Sonny RAMAN, PA-C  pregabalin  (LYRICA ) 100 MG capsule Take 1 capsule (100 mg total) by mouth 2 (two) times daily. 10/03/22   Sater, Charlie LABOR, MD  senna (SENOKOT) 8.6 MG TABS tablet Take 1 tablet (8.6 mg total) by mouth daily. 09/20/22   Jillian Buttery, MD  simvastatin  (ZOCOR ) 20 MG tablet TAKE 1 TABLET BY MOUTH AT BEDTIME 07/02/22   Zarwolo, Gloria, FNP  sucralfate (CARAFATE) 1 g tablet Take 1 g by mouth 4 (four) times daily -  with meals and at bedtime.    [provider]  tamsulosin  (FLOMAX ) 0.4 MG CAPS capsule  Take 1 capsule (0.4 mg total) by mouth 2 (two) times daily. 09/20/22   Jillian Buttery, MD  triamcinolone  cream (KENALOG ) 0.1 % Apply 1 Application topically 2 (two) times daily.    [provider]  trimethoprim  (TRIMPEX ) 100 MG tablet Take 1 tablet (100 mg total) by mouth at bedtime. 09/23/22   Adhikari, Amrit, MD  vitamin B-12 (CYANOCOBALAMIN ) 1000 MCG tablet Take 1,000 mcg by mouth daily.    [provider]    Allergies: Sulfa antibiotics, Hydrocodone , Misc. sulfonamide containing compounds, and Oxycodone     Review of Systems  Unable to perform ROS: Mental status change  Gastrointestinal:   Negative for abdominal pain.    Updated Vital Signs BP 124/65   Pulse 63   Temp 98.6 F (37 C) (Axillary)   Resp 15   Ht 5' 9 (1.753 m)   Wt 127 kg   SpO2 97%   BMI 41.35 kg/m   Physical Exam Vitals and nursing note reviewed.  Constitutional:      Appearance: He is well-developed.     Comments: Lethargic  HENT:     Head: Normocephalic.     Nose: Nose normal.  Eyes:     General: No scleral icterus.    Conjunctiva/sclera: Conjunctivae normal.  Neck:     Thyroid: No thyromegaly.  Cardiovascular:     Rate and Rhythm: Normal rate and regular rhythm.     Heart sounds: No murmur heard.    No friction rub. No gallop.  Pulmonary:     Breath sounds: No stridor. No wheezing or rales.  Chest:     Chest wall: No tenderness.  Abdominal:     General: There is no distension.     Tenderness: There is no abdominal tenderness. There is no rebound.  Musculoskeletal:        General: Normal range of motion.     Cervical back: Neck supple.  Lymphadenopathy:     Cervical: No cervical adenopathy.  Skin:    Findings: No erythema or rash.  Neurological:     Motor: No abnormal muscle tone.     Coordination: Coordination normal.     Comments: Oriented to person only  Psychiatric:        Behavior: Behavior normal.     (all labs ordered are listed, but only abnormal results are displayed) Labs Reviewed  CBC - Abnormal; Notable for the following components:      Result Value   RBC 3.73 (*)    Hemoglobin 11.2 (*)    HCT 35.9 (*)    RDW 17.1 (*)    Platelets 134 (*)    All other components within normal limits  COMPREHENSIVE METABOLIC PANEL WITH GFR - Abnormal; Notable for the following components:   BUN 30 (*)    AST 14 (*)    All other components within normal limits  ETHANOL  PROTIME-INR  APTT  DIFFERENTIAL  MAGNESIUM   RAPID URINE DRUG SCREEN, HOSP PERFORMED  URINALYSIS, ROUTINE W REFLEX MICROSCOPIC  BLOOD GAS, VENOUS  CBG MONITORING, ED  I-STAT CHEM 8, ED     EKG: None  Radiology: [facility] Chest Port 1 View Result Date: 12/20/2023 CLINICAL DATA:  Altered mental status. EXAM: PORTABLE CHEST 1 VIEW COMPARISON:  July 14, 2021 FINDINGS: The heart size and mediastinal contours are within normal limits. Low lung volumes are noted. Very mild atelectatic changes are seen within the bilateral lung bases. No acute infiltrate, pleural effusion or pneumothorax is identified. Postoperative changes are seen within the  cervical spine. IMPRESSION: Low lung volumes with very mild bibasilar atelectasis. Electronically Signed   By: Suzen Dials M.D.   On: 12/20/2023 15:24   CT ANGIO HEAD NECK W WO CM W PERF (CODE STROKE) Result Date: 12/20/2023 CLINICAL DATA:  Neuro deficit, acute, stroke suspected. Slurred speech. EXAM: CT ANGIOGRAPHY HEAD AND NECK CT PERFUSION BRAIN TECHNIQUE: Multidetector CT imaging of the head and neck was performed using the standard protocol during bolus administration of intravenous contrast. Multiplanar CT image reconstructions and MIPs were obtained to evaluate the vascular anatomy. Carotid stenosis measurements (when applicable) are obtained utilizing NASCET criteria, using the distal internal carotid diameter as the denominator. Multiphase CT imaging of the brain was performed following IV bolus contrast injection. Subsequent parametric perfusion maps were calculated using RAPID software. RADIATION DOSE REDUCTION: This exam was performed according to the departmental dose-optimization program which includes automated exposure control, adjustment of the mA and/or kV according to patient size and/or use of iterative reconstruction technique. CONTRAST:  OMNIPAQUE  IOHEXOL  350 MG/ML SOLN COMPARISON:  CTA head and neck 08/01/2022 FINDINGS: CTA NECK FINDINGS Aortic arch: Standard branching with mild atherosclerosis. Widely patent brachiocephalic and subclavian arteries. Right carotid system: Patent with mixed calcified and soft plaque in the carotid  bulb. No evidence of a significant stenosis or dissection. Left carotid system: Patent with calcified plaque in the carotid bulb. No evidence of a significant stenosis or dissection. Vertebral arteries: Patent and codominant without evidence of a significant stenosis or dissection. Skeleton: Prior C4-T2 laminectomies and C4-C7 posterior fusion with similar appearance of a fluid collection in the laminectomy bed compared to the prior CTA, likely postoperative seroma. Multilevel neural foraminal stenosis due to uncovertebral and facet spurring, asymmetrically severe on the left at C3-4 and on the right at C7-T1. 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 Upper chest: Clear lung apices. Review of the MIP images confirms the above findings CTA HEAD FINDINGS Anterior circulation: The internal carotid arteries are patent from skull base to carotid termini with diffuse atherosclerotic calcification resulting in up to mild cavernous and paraclinoid stenosis bilaterally. Branch vessel evaluation is limited by suboptimal contrast timing and venous contamination. The ACAs and MCAs are patent without evidence of a significant A1 or M1 stenosis or proximal M2 occlusion. No aneurysm is identified. Posterior circulation: The intracranial vertebral arteries are patent to the basilar with calcified plaque resulting in unchanged moderate proximal left V4 stenosis. Patent PICA and SCA origins are visualized bilaterally. The basilar artery is patent with at most mild stenosis proximally. There is a patent right posterior communicating artery with mild hypoplasia of the right P1 segment. Both PCAs are patent without evidence of a significant proximal stenosis. No aneurysm is identified. Venous sinuses: Patent. Anatomic variants: None of significance. Review of the MIP images confirms the above findings CT Brain Perfusion Findings: ASPECTS: 10 CBF (<30%) Volume: 0 mL Perfusion (Tmax>6.0s) volume: 0 mL These results were communicated  to Dr. Michaela at 2:19 pm on 12/20/2023 by text page via the [facility] messaging system. IMPRESSION: 1. No large vessel occlusion. 2. Intracranial atherosclerosis with moderate left V4 and mild bilateral ICA stenoses. 3. Cervical carotid atherosclerosis without significant stenosis. 4. No evidence of an acute infarct or ischemic penumbra on CTP. 5. 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 6.  Aortic Atherosclerosis (ICD10-I70.0). Electronically Signed   By: Dasie Hamburg M.D.   On: 12/20/2023 15:01   CT HEAD CODE STROKE WO CONTRAST Result Date: 12/20/2023  CLINICAL DATA:  Code stroke. Neuro deficit, acute, stroke suspected. Slurred speech. EXAM: CT HEAD WITHOUT CONTRAST TECHNIQUE: Contiguous axial images were obtained from the base of the skull through the vertex without intravenous contrast. RADIATION DOSE REDUCTION: This exam was performed according to the departmental dose-optimization program which includes automated exposure control, adjustment of the mA and/or kV according to patient size and/or use of iterative reconstruction technique. COMPARISON:  Head CT 09/15/2022 and MRI 09/18/2022 FINDINGS: Brain: There is no evidence of an acute infarct, intracranial hemorrhage, mass, midline shift, or extra-axial fluid collection. There is mild cerebral atrophy. Cerebral white matter hypodensities are similar to the prior CT and are nonspecific but compatible with mild chronic small vessel ischemic disease. 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 Vascular: Calcified atherosclerosis at the skull base. No hyperdense vessel. Skull: No fracture or suspicious lesion. Sinuses/Orbits: Chronic polyp or mucous retention cyst in the right maxillary sinus and suspected polyp in the posterior right nasal cavity. Clear mastoid air cells. Bilateral cataract extraction. Other: None. ASPECTS (Alberta Stroke Program Early CT Score) - Ganglionic  level infarction (caudate, lentiform nuclei, internal capsule, insula, M1-M3 cortex): 7 - Supraganglionic infarction (M4-M6 cortex): 3 Total score (0-10 with 10 being normal): 10 These results were communicated to Dr. Michaela at 1:57 pm on 12/20/2023 by text page via the [facility] messaging system. IMPRESSION: 1. No evidence of acute intracranial abnormality. ASPECTS of 10. 2. Mild chronic small vessel ischemic disease. Electronically Signed   By: Dasie Hamburg M.D.   On: 12/20/2023 13:57     Procedures   Medications Ordered in the ED  levETIRAcetam  (KEPPRA ) tablet 500 mg (has no administration in time range)    Or  levETIRAcetam  (KEPPRA ) undiluted injection 500 mg (has no administration in time range)  iohexol  (OMNIPAQUE ) 350 MG/ML injection 100 mL (100 mLs Intravenous Contrast Given 12/20/23 1353)  levETIRAcetam  (KEPPRA ) undiluted injection 1,500 mg (1,500 mg Intravenous Given 12/20/23 1523)  sodium chloride  0.9 % bolus 500 mL (500 mLs Intravenous New Bag/Given 12/20/23 1523)  CRITICAL CARE Performed by: Fairy Sermon Total critical care time: 45 minutes Critical care time was exclusive of separately billable procedures and treating other patients. Critical care was necessary to treat or prevent imminent or life-threatening deterioration. Critical care was time spent personally by me on the following activities: development of treatment plan with patient and/or surrogate as well as nursing, discussions with consultants, evaluation of patient's response to treatment, examination of patient, obtaining history from patient or surrogate, ordering and performing treatments and interventions, ordering and review of laboratory studies, ordering and review of radiographic studies, pulse oximetry and re-evaluation of patient's condition.  Patient was seen by Dr. Michaela neurologist and he requested the patient to be admitted to [facility] Decision Making Amount  and/or Complexity of Data Reviewed Labs: ordered. Radiology: ordered.  Risk Decision regarding hospitalization.   Patient will be admitted for seizures to [facility] with neurology following     Final diagnoses:  Seizure [facility])    ED Discharge Orders     None          Sermon Fairy, MD 12/20/23 1659

## 2023-12-20 NOTE — ED Triage Notes (Signed)
 Pty arrived [facility] from [facility] with a code stroke. LKW at 8am ; c/o speech, tremors, ams, and eyes rolling back into his head towards the left. CBG 111. Pt is nonambulatory. DNR at bedside. Pt smells foul with urine.

## 2023-12-20 NOTE — Consult Note (Signed)
 [facility] Telemedicine Consult   Requesting Provider: Suzette PARAS Consult Participants: Patient, I also get history from nursing home Location of the provider: [facility] Location of the patient: [facility]  This consult was provided via telemedicine with 2-way video and audio communication. The patient/family was informed that care would be provided in this way and agreed to receive care in this manner.    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  He was last definitely in his normal state of health around 8 AM this morning at which point he was up and walking around.  He then laid down for a 3-hour long nap and was asleep during 11 AM rounds.  Around 1230, the nursing supervisor was called into the room because the patient was acting abnormally.  He was jerking bilateral upper extremities with eyes rolled back in his head.  He would jerk for a a while, and then be quiescent for a period of time.  In between the episodes of jerking, he had a fixed stare, would not track or follow and would not interact.  At baseline, he has sequela from a previous myelopathy but is otherwise relatively normal, no concerns for dementia per the nursing home staff.  He is able to stand and pivot with assistance, but does not walk.  LKW: 8 AM tnk given?: No, out of window IR Thrombectomy? No, no LVO Mrs: 4 Time of teleneurologist evaluation: 13:29  Exam: Vitals:   12/20/23 1338  BP: 98/60  Pulse: 80    General: in bed, nad  1A: Level of Consciousness - 2 1B: Ask Month and Age - 1 1C: 'Blink Eyes' & 'Squeeze Hands' - 0 2: Test Horizontal Extraocular Movements - 0 3: Test Visual Fields - 0 4: Test Facial Palsy - 0 5A: Test Left Arm Motor Drift - 0 5B: Test Right Arm Motor Drift - 0 6A: Test Left Leg Motor Drift - 3 6B: Test Right Leg  Motor Drift - 3 7: Test Limb Ataxia - 0 8: Test Sensation - 0(though unclear if responses are reliable) 9: Test Language/Aphasia-  10: Test Dysarthria - 1 11: Test Extinction/Inattention - 0(difficult to test due to comprehension) NIHSS score: 10   Imaging Reviewed: CT/CTA are negative  Labs reviewed in epic and pertinent values follow: CMP is normal  Assessment: 78 year old male with a history of previous compressive myelopathy with lower extremity weakness who presents with recurrent episodes of upper extremity jerking coupled with gaze deviation.  On EMS arrival, he had a leftward gaze deviation but had mostly stopped jerking at that point, so unclear whether this was an ictal left gaze or a postictal left gaze.  He continues to improve, but remains fairly encephalopathic.  Based on the description, I have a high concern for seizure and would favor starting antiepileptics given the recurrent episodes and prolonged return to baseline.  He is outside the window for TNK therapy and has no LVO.  Recommendations:  1) Keppra  1500 mg x 1 followed by 500 mg twice daily 2) MRI brain with and without contrast 3) would consider transfer to [facility] for EEG, especially if his mental status does not continue to improve. 4) seizure precaution  Aisha Seals, MD [facility] [phone]  If 7pm- 7am, please page neurology on call as listed in AMION.

## 2023-12-20 NOTE — Plan of Care (Signed)
 Overnight neurology note  Patient transferred from [facility] after telestroke consultation. Remains extremely drowsy.  Pupils equal round reactive.  Gaze midline.  Awakens to voice but does not really follow commands. Overall low suspicion for status epilepticus but would benefit from EEG and MRI.  Neurology will follow

## 2023-12-20 NOTE — ED Notes (Signed)
In and out cath performed, pt tolerated well.

## 2023-12-20 NOTE — H&P (Addendum)
 History and Physical    Patient: [name] FMW:[id number] DOB: 1945/11/25 DOA: 12/20/2023 DOS: the patient was seen and examined on 12/20/2023 PCP: Zarwolo, Gloria, FNP  Patient coming from: ALF/ILF  Chief Complaint:  Chief Complaint  Patient presents with   Code Stroke   HPI: [name] is a 78 y.o. male with medical history significant of hypertension, hypothyroidism, COPD, GERD.  Patient is a resident at [facility].  Patient was last known well at 8 AM. He began to have slurred speech, tremors of his extremities with his eyes rolling back.  EMS was called and the patient was brought to the hospital for evaluation.  He was initially brought in as a code stroke.  This was changed after conversations with the facility occurred and the patient was found to have had seizure-like activity.  CT and CTA of head neck showed no acute findings.  Keppra  was initiated at neurology recommendation.  Review of Systems: As mentioned in the history of present illness. All other systems reviewed and are negative. 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   Nausea vomiting and diarrhea 09/14/2022   Restless leg syndrome    Past Surgical History:  Procedure Laterality Date   BACK SURGERY     Fusion 2005   BIOPSY  09/18/2022   Procedure: BIOPSY;  Surgeon: Shila Gustav GAILS, MD;  Location: [facility];  Service: Gastroenterology;;   COLONOSCOPY WITH PROPOFOL  N/A 11/29/2022   Procedure: COLONOSCOPY WITH PROPOFOL ;  Surgeon: Cindie Carlin POUR, DO;  Location: [facility] ENDO SUITE;  Service: Endoscopy;  Laterality: N/A;  145pm, asa 3, [place] resident   COLONOSCOPY WITH PROPOFOL  N/A 05/26/2023   Procedure: COLONOSCOPY WITH  PROPOFOL ;  Surgeon: Cindie Carlin POUR, DO;  Location: [facility] END[facility] SU[facility];  Service: Endoscopy;  Laterality: N/A;  8:15 am, asa 3   ESOPHAGOGASTRODUODENOSCOPY (EGD) WITH PROPOFOL  N/A 09/18/2022   Procedure: ESOPHAGOGASTRODUODENOSCOPY (EGD) WITH PROPOFOL ;  Surgeon: Shila Gustav GAILS, MD;  Location: [facility] ENDOSCOPY;  Service: Gastroenterology;  Laterality: N/A;   Graves Disease     JOINT REPLACEMENT  1970   Right knee    LAMINECTOMY WITH POSTERIOR LATERAL ARTHRODESIS LEVEL 2 N/A 06/29/2021   Procedure: Laminectomy - Lumbar one-two, Lumbar two-three - redo with posterior lateral fusion with pedicle screws;  Surgeon: Louis Shove, MD;  Location: [facility] OR;  Service: Neurosurgery;  Laterality: N/A;   NECK SURGERY     POSTERIOR CERVICAL FUSION/FORAMINOTOMY  12/05/2011   Procedure: POSTERIOR CERVICAL FUSION/FORAMINOTOMY LEVEL 4;  Surgeon: Darina MALVA Boehringer, MD;  Location: [facility] NEURO ORS;  Service: Neurosurgery;  Laterality: N/A;  Posterior Cervical fixation Cervical four-Seven,Cervical Decompresson Cervical three   THORACIC DISCECTOMY N/A 01/08/2021   Procedure: Thoracic One-Two, Thoracic Two-Three, Thoracic Eleven-Twelve, Thoracic Twelve-Lumbar One Laminectomy;  Surgeon: Louis Shove, MD;  Location: [facility] OR;  Service: Neurosurgery;  Laterality: N/A;   Social History:  reports that he has been smoking cigarettes. He has a 12.5 pack-year smoking history. He has never used smokeless tobacco. He reports that he does not currently use drugs. He reports that he does not drink alcohol.  Allergies  Allergen Reactions   Sulfa Antibiotics Other (  See Comments)    Causes blisters and skin to peel from inside mouth   Hydrocodone     Misc. Sulfonamide Containing Compounds    Oxycodone      Family History  Problem Relation Age of Onset   Heart attack Mother 41   Heart disease Father     Prior to Admission medications   Medication Sig Start Date End Date Taking? Authorizing Provider  acetaminophen  (TYLENOL ) 325 MG tablet Take 650  mg by mouth 3 (three) times daily.    [provider]  aspirin  81 MG chewable tablet Chew 1 tablet (81 mg total) by mouth daily. 09/04/21   Zarwolo, Gloria, FNP  calcium  carbonate (TUMS - DOSED IN MG ELEMENTAL CALCIUM ) 500 MG chewable tablet Chew 1 tablet by mouth 3 (three) times daily with meals.    [provider]  Carboxymethylcellulose Sodium (ARTIFICIAL TEARS OP) Apply 2 drops to eye every 2 (two) hours as needed.    [provider]  Cholecalciferol  (VITAMIN D3) 1.25 MG (50000 UT) TABS Take 50,000 Units by mouth every Wednesday.    [provider]  DULoxetine  HCl 60 MG CSDR Take 1 capsule by mouth daily.    [provider]  ferrous sulfate  325 (65 FE) MG tablet Take 325 mg by mouth daily with breakfast.    [provider]  levothyroxine  (SYNTHROID ) 150 MCG tablet TAKE 1 TABLET BY MOUTH DAILY BEFORE BREAKFAST 02/04/22   Zarwolo, Gloria, FNP  loperamide  (IMODIUM  A-D) 2 MG tablet Take 2 mg by mouth 4 (four) times daily as needed for diarrhea or loose stools.    [provider]  Magnesium  Oxide 400 MG CAPS Take 2 capsules (800 mg total) by mouth daily. 09/20/22   Adhikari, Amrit, MD  midodrine (PROAMATINE) 10 MG tablet Take 10 mg by mouth 3 (three) times daily. 05/09/23   [provider]  Multiple Vitamins-Minerals (PRESERVISION AREDS 2 PO) Take 1 capsule by mouth daily.    [provider]  pantoprazole  (PROTONIX ) 40 MG tablet Take 1 tablet (40 mg total) by mouth 2 (two) times daily. 09/20/22   Adhikari, Amrit, MD  polyethylene glycol (MIRALAX  / GLYCOLAX ) 17 g packet Take 17 g by mouth daily. Hold for 24 hours if watery stools, or more than 3 stools daily. 02/24/23   Ezzard Sonny RAMAN, PA-C  pregabalin  (LYRICA ) 100 MG capsule Take 1 capsule (100 mg total) by mouth 2 (two) times daily. 10/03/22   Sater, Charlie LABOR, MD  senna (SENOKOT) 8.6 MG TABS tablet Take 1 tablet (8.6 mg total) by mouth daily. 09/20/22   Jillian Buttery, MD   simvastatin  (ZOCOR ) 20 MG tablet TAKE 1 TABLET BY MOUTH AT BEDTIME 07/02/22   Zarwolo, Gloria, FNP  sucralfate (CARAFATE) 1 g tablet Take 1 g by mouth 4 (four) times daily -  with meals and at bedtime.    [provider]  tamsulosin  (FLOMAX ) 0.4 MG CAPS capsule Take 1 capsule (0.4 mg total) by mouth 2 (two) times daily. 09/20/22   Jillian Buttery, MD  triamcinolone  cream (KENALOG ) 0.1 % Apply 1 Application topically 2 (two) times daily.    [provider]  trimethoprim  (TRIMPEX ) 100 MG tablet Take 1 tablet (100 mg total) by mouth at bedtime. 09/23/22   Adhikari, Amrit, MD  vitamin B-12 (CYANOCOBALAMIN ) 1000 MCG tablet Take 1,000 mcg by mouth daily.    [provider]    Physical Exam: Vitals:   12/20/23 1415 12/20/23 1419 12/20/23 1430 12/20/23 1500  BP: 132/71  101/87  124/65  Pulse: 71  75 63  Resp: 18   15  Temp: 98.6 F (37 C)     TempSrc: Axillary     SpO2: 95%  96% 97%  Weight:  127 kg    Height:  5' 9 (1.753 m)     General: Elderly male.  Somnolent. No acute cardiopulmonary distress.  HEENT: Normocephalic atraumatic.  Right and left ears normal in appearance.  Pupils equal, round, reactive to light. Extraocular muscles are intact. Sclerae anicteric and noninjected.  Moist mucosal membranes. No mucosal lesions.  Neck: Neck supple without lymphadenopathy. No carotid bruits. No masses palpated.  Cardiovascular: Regular rate with normal S1-S2 sounds. No murmurs, rubs, gallops auscultated. No JVD.  Respiratory: Good respiratory effort with no wheezes, rales, rhonchi. Lungs clear to auscultation bilaterally.  No accessory muscle use. Abdomen: Soft, nontender, nondistended. Active bowel sounds. No masses or hepatosplenomegaly  Skin: No rashes, lesions, or ulcerations.  Dry, warm to touch. 2+ dorsalis pedis and radial pulses. Musculoskeletal: No calf or leg pain. All major joints not erythematous nontender.  No upper or lower joint deformation.  Good ROM.  No  contractures  Psychiatric: Unable to assess. Neurologic: Unable to assess  Data Reviewed: Labs and imaging reviewed by me  Assessment and Plan: No notes have been filed under this hospital service. Service: Hospitalist  Principal Problem:   New onset seizure (HCC) Active Problems:   COPD (chronic obstructive pulmonary disease) (HCC)   Essential hypertension   Acquired hypothyroidism   Thrombocytopenia (HCC)  New onset seizure Admit to [facility] Telemetry monitoring Seizure precautions Keppra  EEG Currently NPO MRI brain w and wo contrast UA still pending Hypertension Normotensive currently Anxiety Hypothyroidism   Advance Care Planning: DNR sheet at bedside  Consults: neuro  Family Communication: none  Severity of Illness: The appropriate patient status for this patient is INPATIENT. Inpatient status is judged to be reasonable and necessary in order to provide the required intensity of service to ensure the patient's safety. The patient's presenting symptoms, physical exam findings, and initial radiographic and laboratory data in the context of their chronic comorbidities is felt to place them at high risk for further clinical deterioration. Furthermore, it is not anticipated that the patient will be medically stable for discharge from the hospital within 2 midnights of admission.   * I certify that at the point of admission it is my clinical judgment that the patient will require inpatient hospital care spanning beyond 2 midnights from the point of admission due to high intensity of service, high risk for further deterioration and high frequency of surveillance required.*  Author: Madailein Londo J Yentl Verge, DO 12/20/2023 3:35 PM  For on call review [email or web address].

## 2023-12-20 NOTE — ED Notes (Signed)
 Gave report to Bascom Lesches @ [address] regarding pt discharge from [facility] to [facility].

## 2023-12-20 NOTE — Progress Notes (Signed)
 EEG complete - results pending

## 2023-12-21 DIAGNOSIS — G9340 Encephalopathy, unspecified: Secondary | ICD-10-CM

## 2023-12-21 DIAGNOSIS — R569 Unspecified convulsions: Secondary | ICD-10-CM

## 2023-12-21 LAB — URINALYSIS, W/ REFLEX TO CULTURE (INFECTION SUSPECTED)
Bilirubin Urine: NEGATIVE
Glucose, UA: NEGATIVE mg/dL
Ketones, ur: NEGATIVE mg/dL
Nitrite: NEGATIVE
Protein, ur: 30 mg/dL — AB
RBC / HPF: >50 RBC/hpf (ref 0–5)
Specific Gravity, Urine: 1.04 — ABNORMAL HIGH (ref 1.005–1.030)
WBC, UA: >50 WBC/hpf (ref 0–5)
pH: 5 (ref 5.0–8.0)

## 2023-12-21 MED ORDER — ORAL CARE MOUTH RINSE
15.0000 mL | Freq: Two times a day (BID) | OROMUCOSAL | Status: DC
  Administered 2023-12-21 – 2023-12-23 (×5): 15 mL via OROMUCOSAL

## 2023-12-21 MED ORDER — LEVOTHYROXINE SODIUM 75 MCG PO TABS
150.0000 ug | ORAL_TABLET | Freq: Every day | ORAL | Status: DC
  Administered 2023-12-22 – 2023-12-23 (×2): 150 ug via ORAL
  Filled 2023-12-21 (×3): qty 2

## 2023-12-21 MED ORDER — TAMSULOSIN HCL 0.4 MG PO CAPS
0.4000 mg | ORAL_CAPSULE | Freq: Two times a day (BID) | ORAL | Status: DC
  Administered 2023-12-21 – 2023-12-23 (×5): 0.4 mg via ORAL
  Filled 2023-12-21 (×5): qty 1

## 2023-12-21 MED ORDER — DIAZEPAM 5 MG/ML IJ SOLN
2.5000 mg | Freq: Once | INTRAMUSCULAR | Status: AC | PRN
  Administered 2023-12-22: 2.5 mg via INTRAVENOUS
  Filled 2023-12-21: qty 2

## 2023-12-21 MED ORDER — RISPERIDONE 0.5 MG PO TABS
2.0000 mg | ORAL_TABLET | Freq: Two times a day (BID) | ORAL | Status: DC
  Administered 2023-12-21 – 2023-12-23 (×5): 2 mg via ORAL
  Filled 2023-12-21 (×5): qty 4

## 2023-12-21 MED ORDER — DULOXETINE HCL 60 MG PO CPEP
60.0000 mg | ORAL_CAPSULE | Freq: Every day | ORAL | Status: DC
  Administered 2023-12-21 – 2023-12-23 (×3): 60 mg via ORAL
  Filled 2023-12-21 (×3): qty 1

## 2023-12-21 MED ORDER — VITAMIN B-12 1000 MCG PO TABS
1000.0000 ug | ORAL_TABLET | Freq: Every day | ORAL | Status: DC
  Administered 2023-12-21 – 2023-12-23 (×3): 1000 ug via ORAL
  Filled 2023-12-21 (×3): qty 1

## 2023-12-21 MED ORDER — ASPIRIN 81 MG PO CHEW
81.0000 mg | CHEWABLE_TABLET | Freq: Every day | ORAL | Status: DC
  Administered 2023-12-21 – 2023-12-23 (×3): 81 mg via ORAL
  Filled 2023-12-21 (×3): qty 1

## 2023-12-21 MED ORDER — LORAZEPAM 0.5 MG PO TABS
0.5000 mg | ORAL_TABLET | Freq: Three times a day (TID) | ORAL | Status: DC
  Administered 2023-12-21 – 2023-12-23 (×5): 0.5 mg via ORAL
  Filled 2023-12-21 (×5): qty 1

## 2023-12-21 MED ORDER — PREGABALIN 100 MG PO CAPS
100.0000 mg | ORAL_CAPSULE | Freq: Two times a day (BID) | ORAL | Status: DC
  Administered 2023-12-21 – 2023-12-23 (×5): 100 mg via ORAL
  Filled 2023-12-21 (×5): qty 1

## 2023-12-21 MED ORDER — TRIMETHOPRIM 100 MG PO TABS
100.0000 mg | ORAL_TABLET | Freq: Every day | ORAL | Status: DC
  Administered 2023-12-21 – 2023-12-22 (×2): 100 mg via ORAL
  Filled 2023-12-21 (×3): qty 1

## 2023-12-21 NOTE — Progress Notes (Signed)
 PROGRESS NOTE    KEEVIN PANEBIANCO  FMW:[id number] DOB: [birth year] DOA: 12/20/2023 PCP: Zarwolo, Gloria, FNP  Chief Complaint  Patient presents with   Code Stroke    Brief Narrative:   [name] is [name] 78 y.o. male with medical history significant of hypertension, hypothyroidism, COPD, GERD.  Patient is Martino Tompson resident at [facility].  Patient was last known well at 8 AM. He began to have slurred speech, tremors of his extremities with his eyes rolling back.  EMS was called and the patient was brought to the hospital for evaluation.  He was initially brought in as [name] code stroke.  This was changed after conversations with the facility occurred and the patient was found to have had seizure-like activity.  CT and CTA of head neck showed no acute findings.  Keppra  was initiated at neurology recommendation.   Assessment & Plan:   Principal Problem:   New onset seizure (HCC) Active Problems:   COPD (chronic obstructive pulmonary disease) (HCC)   Essential hypertension   Acquired hypothyroidism   Thrombocytopenia (HCC)  New Onset Seizure Appreciate neurology recommendations - keppra   Head CT without acute intracranial abnormality CTA head/neck without LVO, intracranial atherosclerosis with moderate L V4 and mild bilateral ICA stenosis.  Cervical carotid atherosclerosis without significant stenosis.  No evidence of acute infarct or ischemic penumbra.   Awaiting MRI brain with/without (may need valium  prior to MRI) EEG without seizures or epileptiform discharges UA with large LE, >50 WBC, 6/10 RBC - will repeat with reflex/culture  [place] statutes, patients with seizures are not allowed to drive until  they have been seizure-free for six months. Use caution when using heavy equipment or power tools. Avoid working on ladders or at heights. Take showers instead of baths. Ensure the water  temperature is not too high on the home water  heater. Do not go swimming alone. When  caring for infants or small children, sit down when holding, feeding, or changing them to minimize risk of injury to the child in the event you have [name] seizure. Also, Maintain good sleep hygiene. Avoid alcohol.  Acute Metabolic Encephalopathy Suspect related to above Seems to be improving, currently Terease Marcotte&O, but still seems Carolyne Whitsel bit confused (will need to sort out what his baseline is) Delirium precautions  Chronic Hypotension Appears to be on midodrine chronically  Recurrent UTI's Appears he's on nightly trimethoprim   Hypothyroidism Synthroid    Depression  Anxiety Chronic Pain  Cymbalta , lyrica , risperdal   He's on chronic ativan  TID  Hypothyroidism Synthroid    Aortic Atherosclerosis ASA  Parotid Mass Needs ENT follow up outpatient      DVT prophylaxis: SCD Code Status: DNR Family Communication: none Disposition:   Status is: Inpatient Remains inpatient appropriate because: need for continued inpatient care   Consultants:  neurology  Procedures:  EEG  Antimicrobials:  Anti-infectives (From admission, onward)    None       Subjective: No new complaints Knows location, month  Objective: Vitals:   12/21/23 0240 12/21/23 0344 12/21/23 0714 12/21/23 1114  BP: 96/78 111/67 (!) 119/59 94/60  Pulse: 64 (!) 59 72 81  Resp:  18 19 15   Temp:  98.3 F (36.8 C) 97.6 F (36.4 C) (!) 97.5 F (36.4 C)  TempSrc:  Oral Oral Oral  SpO2: 96% 95% 97% 94%  Weight:      Height:        Intake/Output Summary (Last 24 hours) at 12/21/2023 1123 Last data filed at 12/21/2023 0400 Gross per  24 hour  Intake 103.7 ml  Output --  Net 103.7 ml   Filed Weights   12/20/23 1419  Weight: 127 kg    Examination:  General exam: Appears calm and comfortable  Respiratory system: unlabored Cardiovascular system: RRR Gastrointestinal system: Abdomen is nondistended, soft and nontender.  Central nervous system: chronic LE weakness Extremities: no LEE    Data Reviewed: I have  personally reviewed following labs and imaging studies  CBC: Recent Labs  Lab 12/20/23 1333  WBC 7.8  NEUTROABS 6.1  HGB 11.2*  HCT 35.9*  MCV 96.2  PLT 134*    Basic Metabolic Panel: Recent Labs  Lab 12/20/23 1333 12/20/23 1424  NA 141  --   K 4.0  --   CL 103  --   CO2 27  --   GLUCOSE 90  --   BUN 30*  --   CREATININE 0.84  --   CALCIUM  9.3  --   MG  --  1.9    GFR: Estimated Creatinine Clearance: 100.2 mL/min (by C-G formula based on SCr of 0.84 mg/dL).  Liver Function Tests: Recent Labs  Lab 12/20/23 1333  AST 14*  ALT 10  ALKPHOS 96  BILITOT 0.5  PROT 6.9  ALBUMIN  3.5    CBG: Recent Labs  Lab 12/20/23 1333  GLUCAP 81     No results found for this or any previous visit (from the past 240 hours).       Radiology Studies: EEG adult Result Date: 12/21/2023 Shelton Arlin KIDD, MD     12/21/2023  8:18 AM Patient Name: [name] MRN: [id number] Epilepsy Attending: Arlin KIDD Shelton Referring Physician/Provider: Voncile Isles, MD Date: 12/20/2023 Duration: 24.49 mins Patient history: 78yo M with slurred speech, tremors of his extremities with his eyes rolling back. EEG to evaluate for seizure Level of alertness: Awake, asleep AEDs during EEG study: LEV Technical aspects: This EEG study was done with scalp electrodes positioned according to the 10-20 International system of electrode placement. Electrical activity was reviewed with band pass filter of 1-70Hz , sensitivity of 7 uV/mm, display speed of 75mm/sec with Breylon Sherrow 60Hz  notched filter applied as appropriate. EEG data were recorded continuously and digitally stored.  Video monitoring was available and reviewed as appropriate. Description: The posterior dominant rhythm consists of 8 Hz activity of moderate voltage (25-35 uV) seen predominantly in posterior head regions, symmetric and reactive to eye opening and eye closing. Sleep was characterized by vertex waves, sleep spindles (12 to 14 Hz), maximal  frontocentral region.  There is intermittent generalized 3 to 6 Hz theta-delta slowing. Physiologic photic driving was not seen during photic stimulation.  Hyperventilation was not performed.   ABNORMALITY - Intermittent slow, generalized IMPRESSION: This study is suggestive of mild diffuse encephalopathy. No seizures or epileptiform discharges were seen throughout the recording. Arlin KIDD Shelton   DG Chest Port 1 View Result Date: 12/20/2023 CLINICAL DATA:  Altered mental status. EXAM: PORTABLE CHEST 1 VIEW COMPARISON:  July 14, 2021 FINDINGS: The heart size and mediastinal contours are within normal limits. Low lung volumes are noted. Very mild atelectatic changes are seen within the bilateral lung bases. No acute infiltrate, pleural effusion or pneumothorax is identified. Postoperative changes are seen within the cervical spine. IMPRESSION: Low lung volumes with very mild bibasilar atelectasis. Electronically Signed   By: Suzen Dials M.D.   On: 12/20/2023 15:24   CT ANGIO HEAD NECK W WO CM W PERF (CODE STROKE) Result Date: 12/20/2023 CLINICAL DATA:  Neuro deficit, acute, stroke suspected. Slurred speech. EXAM: CT ANGIOGRAPHY HEAD AND NECK CT PERFUSION BRAIN TECHNIQUE: Multidetector CT imaging of the head and neck was performed using the standard protocol during bolus administration of intravenous contrast. Multiplanar CT image reconstructions and MIPs were obtained to evaluate the vascular anatomy. Carotid stenosis measurements (when applicable) are obtained utilizing NASCET criteria, using the distal internal carotid diameter as the denominator. Multiphase CT imaging of the brain was performed following IV bolus contrast injection. Subsequent parametric perfusion maps were calculated using RAPID software. RADIATION DOSE REDUCTION: This exam was performed according to the departmental dose-optimization program which includes automated exposure control, adjustment of the mA and/or kV according to patient  size and/or use of iterative reconstruction technique. CONTRAST:  OMNIPAQUE  IOHEXOL  350 MG/ML SOLN COMPARISON:  CTA head and neck 08/01/2022 FINDINGS: CTA NECK FINDINGS Aortic arch: Standard branching with mild atherosclerosis. Widely patent brachiocephalic and subclavian arteries. Right carotid system: Patent with mixed calcified and soft plaque in the carotid bulb. No evidence of Sherill Wegener significant stenosis or dissection. Left carotid system: Patent with calcified plaque in the carotid bulb. No evidence of Brooke Payes significant stenosis or dissection. Vertebral arteries: Patent and codominant without evidence of Ramiel Forti significant stenosis or dissection. Skeleton: Prior C4-T2 laminectomies and C4-C7 posterior fusion with similar appearance of Tylesha Gibeault fluid collection in the laminectomy bed compared to the prior CTA, likely postoperative seroma. Multilevel neural foraminal stenosis due to uncovertebral and facet spurring, asymmetrically severe on the left at C3-4 and on the right at C7-T1. 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 Upper chest: Clear lung apices. Review of the MIP images confirms the above findings CTA HEAD FINDINGS Anterior circulation: The internal carotid arteries are patent from skull base to carotid termini with diffuse atherosclerotic calcification resulting in up to mild cavernous and paraclinoid stenosis bilaterally. Branch vessel evaluation is limited by suboptimal contrast timing and venous contamination. The ACAs and MCAs are patent without evidence of Tabetha Haraway significant A1 or M1 stenosis or proximal M2 occlusion. No aneurysm is identified. Posterior circulation: The intracranial vertebral arteries are patent to the basilar with calcified plaque resulting in unchanged moderate proximal left V4 stenosis. Patent PICA and SCA origins are visualized bilaterally. The basilar artery is patent with at most mild stenosis proximally. There is Kadey Mihalic patent right posterior communicating artery with mild hypoplasia of  the right P1 segment. Both PCAs are patent without evidence of Pebbles Zeiders significant proximal stenosis. No aneurysm is identified. Venous sinuses: Patent. Anatomic variants: None of significance. Review of the MIP images confirms the above findings CT Brain Perfusion Findings: ASPECTS: 10 CBF (<30%) Volume: 0 mL Perfusion (Tmax>6.0s) volume: 0 mL These results were communicated to Dr. Michaela at 2:19 pm on 12/20/2023 by text page via the [facility] messaging system. IMPRESSION: 1. No large vessel occlusion. 2. Intracranial atherosclerosis with moderate left V4 and mild bilateral ICA stenoses. 3. Cervical carotid atherosclerosis without significant stenosis. 4. No evidence of an acute infarct or ischemic penumbra on CTP. 5. 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 6.  Aortic Atherosclerosis (ICD10-I70.0). Electronically Signed   By: Dasie Hamburg M.D.   On: 12/20/2023 15:01   CT HEAD CODE STROKE WO CONTRAST Result Date: 12/20/2023 CLINICAL DATA:  Code stroke. Neuro deficit, acute, stroke suspected. Slurred speech. EXAM: CT HEAD WITHOUT CONTRAST TECHNIQUE: Contiguous axial images were obtained from the base of the skull through the vertex without intravenous contrast. RADIATION DOSE REDUCTION: This exam was performed according to  the departmental dose-optimization program which includes automated exposure control, adjustment of the mA and/or kV according to patient size and/or use of iterative reconstruction technique. COMPARISON:  Head CT 09/15/2022 and MRI 09/18/2022 FINDINGS: Brain: There is no evidence of an acute infarct, intracranial hemorrhage, mass, midline shift, or extra-axial fluid collection. There is mild cerebral atrophy. Cerebral white matter hypodensities are similar to the prior CT and are nonspecific but compatible with mild chronic small vessel ischemic disease. 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 Vascular: Calcified  atherosclerosis at the skull base. No hyperdense vessel. Skull: No fracture or suspicious lesion. Sinuses/Orbits: Chronic polyp or mucous retention cyst in the right maxillary sinus and suspected polyp in the posterior right nasal cavity. Clear mastoid air cells. Bilateral cataract extraction. Other: None. ASPECTS ([place] Stroke Program Early CT Score) - Ganglionic level infarction (caudate, lentiform nuclei, internal capsule, insula, M1-M3 cortex): 7 - Supraganglionic infarction (M4-M6 cortex): 3 Total score (0-10 with 10 being normal): 10 These results were communicated to Dr. Michaela at 1:57 pm on 12/20/2023 by text page via the [facility] messaging system. IMPRESSION: 1. No evidence of acute intracranial abnormality. ASPECTS of 10. 2. Mild chronic small vessel ischemic disease. Electronically Signed   By: Dasie Hamburg M.D.   On: 12/20/2023 13:57        Scheduled Meds:  levETIRAcetam   500 mg Oral BID   Or   levETIRAcetam   500 mg Intravenous BID   mouth rinse  15 mL Mouth Rinse Q12H   pantoprazole  (PROTONIX ) IV  40 mg Intravenous Q12H   Continuous Infusions:  sodium chloride  75 mL/hr (12/21/23 0241)     LOS: 1 day    Time spent: over 30 min     Meliton Monte, MD [facility]   To contact the attending provider between 7A-7P or the covering provider during after hours 7P-7A, please log into the web site [email or web address] and access using universal [place] password for that web site. If you do not have the password, please call the hospital operator.  12/21/2023, 11:23 AM

## 2023-12-21 NOTE — Progress Notes (Signed)
 MRI reports that patient is unable to lie flat enough for the exam.

## 2023-12-21 NOTE — Progress Notes (Signed)
 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  He was last definitely in his normal state of health around 8 AM this morning at which point he was up and walking around.  He then laid down for a 3-hour long nap and was asleep during 11 AM rounds.  Around 1230, the nursing supervisor was called into the room because the patient was acting abnormally.  He was jerking bilateral upper extremities with eyes rolled back in his head.  He would jerk for a a while, and then be quiescent for a period of time.  In between the episodes of jerking, he had a fixed stare, would not track or follow and would not interact.   Subjective: Interval History:  Transferred here for further workup with brain MRI and EEG since EEG was not available at [facility]  Objective: Vital signs in last 24 hours: Temp:  [97.5 F (36.4 C)-99.3 F (37.4 C)] 98 F (36.7 C) (09/07 1534) Pulse Rate:  [59-81] 78 (09/07 1534) Resp:  [14-19] 19 (09/07 1534) BP: (94-142)/(59-89) 110/68 (09/07 1534) SpO2:  [94 %-99 %] 97 % (09/07 1534)  Intake/Output from previous day: 09/06 0701 - 09/07 0700 In: 103.7 [I.V.:98.7; IV Piggyback:5] Out: -  Intake/Output this shift: Total I/O In: 706.6 [I.V.:706.6] Out: -  Nutritional status:  Diet Order             Diet regular Room service appropriate? Yes; Fluid consistency: Thin  Diet effective now                    Physical exam: General: No acute distress.  Laying in bed. Neuroexam: He is alert and awake.  Oriented to person and place. No aphasia.  Following all commands.  No aphasia. Cranial nerves: Pupils equal and reactive.  Extraocular motion intact.  No field cut.  No dysarthria. Motor exam: He is able to lift both arms and legs off the bed slight drift on the left leg.  He has some difficulty walking at baseline but this is chronic.   Lab Results: Recent Labs     12/20/23 1333  WBC 7.8  HGB 11.2*  HCT 35.9*  PLT 134*  NA 141  K 4.0  CL 103  CO2 27  GLUCOSE 90  BUN 30*  CREATININE 0.84  CALCIUM  9.3   Lipid Panel No results for input(s): CHOL, TRIG, HDL, CHOLHDL, VLDL, LDLCALC in the last 72 hours.  Studies/Results: EEG adult Result Date: 12/21/2023 Shelton Arlin KIDD, MD     12/21/2023  8:18 AM Patient Name: [name] MRN: [id number] Epilepsy Attending: Arlin KIDD Shelton Referring Physician/Provider: Voncile Isles, MD Date: 12/20/2023 Duration: 24.49 mins Patient history: 78yo M with slurred speech, tremors of his extremities with his eyes rolling back. EEG to evaluate for seizure Level of alertness: Awake, asleep AEDs during EEG study: LEV Technical aspects: This EEG study was done with scalp electrodes positioned according to the 10-20 International system of electrode placement. Electrical activity was reviewed with band pass filter of 1-70Hz , sensitivity of 7 uV/mm, display speed of 61mm/sec with a 60Hz  notched filter applied as appropriate. EEG data were recorded continuously and digitally stored.  Video monitoring was available and reviewed as appropriate. Description: The posterior dominant rhythm consists of 8 Hz activity of moderate voltage (25-35 uV) seen predominantly in posterior head regions, symmetric and reactive to eye opening and eye closing. Sleep was characterized  by vertex waves, sleep spindles (12 to 14 Hz), maximal frontocentral region.  There is intermittent generalized 3 to 6 Hz theta-delta slowing. Physiologic photic driving was not seen during photic stimulation.  Hyperventilation was not performed.   ABNORMALITY - Intermittent slow, generalized IMPRESSION: This study is suggestive of mild diffuse encephalopathy. No seizures or epileptiform discharges were seen throughout the recording. Arlin MALVA Krebs   DG Chest Port 1 View Result Date: 12/20/2023 CLINICAL DATA:  Altered mental status. EXAM: PORTABLE CHEST 1  VIEW COMPARISON:  July 14, 2021 FINDINGS: The heart size and mediastinal contours are within normal limits. Low lung volumes are noted. Very mild atelectatic changes are seen within the bilateral lung bases. No acute infiltrate, pleural effusion or pneumothorax is identified. Postoperative changes are seen within the cervical spine. IMPRESSION: Low lung volumes with very mild bibasilar atelectasis. Electronically Signed   By: Suzen Dials M.D.   On: 12/20/2023 15:24   CT ANGIO HEAD NECK W WO CM W PERF (CODE STROKE) Result Date: 12/20/2023 CLINICAL DATA:  Neuro deficit, acute, stroke suspected. Slurred speech. EXAM: CT ANGIOGRAPHY HEAD AND NECK CT PERFUSION BRAIN TECHNIQUE: Multidetector CT imaging of the head and neck was performed using the standard protocol during bolus administration of intravenous contrast. Multiplanar CT image reconstructions and MIPs were obtained to evaluate the vascular anatomy. Carotid stenosis measurements (when applicable) are obtained utilizing NASCET criteria, using the distal internal carotid diameter as the denominator. Multiphase CT imaging of the brain was performed following IV bolus contrast injection. Subsequent parametric perfusion maps were calculated using RAPID software. RADIATION DOSE REDUCTION: This exam was performed according to the departmental dose-optimization program which includes automated exposure control, adjustment of the mA and/or kV according to patient size and/or use of iterative reconstruction technique. CONTRAST:  OMNIPAQUE  IOHEXOL  350 MG/ML SOLN COMPARISON:  CTA head and neck 08/01/2022 FINDINGS: CTA NECK FINDINGS Aortic arch: Standard branching with mild atherosclerosis. Widely patent brachiocephalic and subclavian arteries. Right carotid system: Patent with mixed calcified and soft plaque in the carotid bulb. No evidence of a significant stenosis or dissection. Left carotid system: Patent with calcified plaque in the carotid bulb. No  evidence of a significant stenosis or dissection. Vertebral arteries: Patent and codominant without evidence of a significant stenosis or dissection. Skeleton: Prior C4-T2 laminectomies and C4-C7 posterior fusion with similar appearance of a fluid collection in the laminectomy bed compared to the prior CTA, likely postoperative seroma. Multilevel neural foraminal stenosis due to uncovertebral and facet spurring, asymmetrically severe on the left at C3-4 and on the right at C7-T1. 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 Upper chest: Clear lung apices. Review of the MIP images confirms the above findings CTA HEAD FINDINGS Anterior circulation: The internal carotid arteries are patent from skull base to carotid termini with diffuse atherosclerotic calcification resulting in up to mild cavernous and paraclinoid stenosis bilaterally. Branch vessel evaluation is limited by suboptimal contrast timing and venous contamination. The ACAs and MCAs are patent without evidence of a significant A1 or M1 stenosis or proximal M2 occlusion. No aneurysm is identified. Posterior circulation: The intracranial vertebral arteries are patent to the basilar with calcified plaque resulting in unchanged moderate proximal left V4 stenosis. Patent PICA and SCA origins are visualized bilaterally. The basilar artery is patent with at most mild stenosis proximally. There is a patent right posterior communicating artery with mild hypoplasia of the right P1 segment. Both PCAs are patent without evidence of a significant proximal stenosis. No aneurysm is  identified. Venous sinuses: Patent. Anatomic variants: None of significance. Review of the MIP images confirms the above findings CT Brain Perfusion Findings: ASPECTS: 10 CBF (<30%) Volume: 0 mL Perfusion (Tmax>6.0s) volume: 0 mL These results were communicated to Dr. Michaela at 2:19 pm on 12/20/2023 by text page via the [facility] messaging system. IMPRESSION: 1. No large vessel occlusion. 2.  Intracranial atherosclerosis with moderate left V4 and mild bilateral ICA stenoses. 3. Cervical carotid atherosclerosis without significant stenosis. 4. No evidence of an acute infarct or ischemic penumbra on CTP. 5. 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 6.  Aortic Atherosclerosis (ICD10-I70.0). Electronically Signed   By: Dasie Hamburg M.D.   On: 12/20/2023 15:01   CT HEAD CODE STROKE WO CONTRAST Result Date: 12/20/2023 CLINICAL DATA:  Code stroke. Neuro deficit, acute, stroke suspected. Slurred speech. EXAM: CT HEAD WITHOUT CONTRAST TECHNIQUE: Contiguous axial images were obtained from the base of the skull through the vertex without intravenous contrast. RADIATION DOSE REDUCTION: This exam was performed according to the departmental dose-optimization program which includes automated exposure control, adjustment of the mA and/or kV according to patient size and/or use of iterative reconstruction technique. COMPARISON:  Head CT 09/15/2022 and MRI 09/18/2022 FINDINGS: Brain: There is no evidence of an acute infarct, intracranial hemorrhage, mass, midline shift, or extra-axial fluid collection. There is mild cerebral atrophy. Cerebral white matter hypodensities are similar to the prior CT and are nonspecific but compatible with mild chronic small vessel ischemic disease. 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 Vascular: Calcified atherosclerosis at the skull base. No hyperdense vessel. Skull: No fracture or suspicious lesion. Sinuses/Orbits: Chronic polyp or mucous retention cyst in the right maxillary sinus and suspected polyp in the posterior right nasal cavity. Clear mastoid air cells. Bilateral cataract extraction. Other: None. ASPECTS ([place] Stroke Program Early CT Score) - Ganglionic level infarction (caudate, lentiform nuclei, internal capsule, insula, M1-M3 cortex): 7 - Supraganglionic infarction (M4-M6 cortex): 3  Total score (0-10 with 10 being normal): 10 These results were communicated to Dr. Michaela at 1:57 pm on 12/20/2023 by text page via the [facility] messaging system. IMPRESSION: 1. No evidence of acute intracranial abnormality. ASPECTS of 10. 2. Mild chronic small vessel ischemic disease. Electronically Signed   By: Dasie Hamburg M.D.   On: 12/20/2023 13:57    Medications: I have reviewed the patient's current medications.  Assessment/Plan:  78 year old male with a history of previous compressive myelopathy with lower extremity weakness who presents with recurrent episodes of upper extremity jerking coupled with gaze deviation that is now resolved.  Encephalopathic in ER transferred here for further workup with MRI and EEG.  Put on Keppra .  No further spells and confusion has resolved.  EEG is negative.  Plan: Follow MRI appears that he may need sedation. Continue Keppra  500 twice daily  Neurology will follow-up on MRI when completed if positive for stroke will complete stroke workup.   LOS: 1 day   Hayden Kihara M Tristin Vandeusen

## 2023-12-21 NOTE — Procedures (Signed)
 Patient Name: [name]  MRN: [id number]  Epilepsy Attending: Arlin MALVA Krebs  Referring Physician/Provider: Voncile Isles, MD  Date: 12/20/2023 Duration: 24.49 mins  Patient history: 78yo M with slurred speech, tremors of his extremities with his eyes rolling back. EEG to evaluate for seizure  Level of alertness: Awake, asleep  AEDs during EEG study: LEV  Technical aspects: This EEG study was done with scalp electrodes positioned according to the 10-20 International system of electrode placement. Electrical activity was reviewed with band pass filter of 1-70Hz , sensitivity of 7 uV/mm, display speed of 36mm/sec with a 60Hz  notched filter applied as appropriate. EEG data were recorded continuously and digitally stored.  Video monitoring was available and reviewed as appropriate.  Description: The posterior dominant rhythm consists of 8 Hz activity of moderate voltage (25-35 uV) seen predominantly in posterior head regions, symmetric and reactive to eye opening and eye closing. Sleep was characterized by vertex waves, sleep spindles (12 to 14 Hz), maximal frontocentral region.  There is intermittent generalized 3 to 6 Hz theta-delta slowing. Physiologic photic driving was not seen during photic stimulation.  Hyperventilation was not performed.     ABNORMALITY - Intermittent slow, generalized  IMPRESSION: This study is suggestive of mild diffuse encephalopathy. No seizures or epileptiform discharges were seen throughout the recording.  Toby Ayad O Brentney Goldbach

## 2023-12-22 ENCOUNTER — Inpatient Hospital Stay (HOSPITAL_COMMUNITY)

## 2023-12-22 ENCOUNTER — Other Ambulatory Visit (INDEPENDENT_AMBULATORY_CARE_PROVIDER_SITE_OTHER): Payer: Self-pay | Admitting: Physician Assistant

## 2023-12-22 DIAGNOSIS — K118 Other diseases of salivary glands: Secondary | ICD-10-CM

## 2023-12-22 DIAGNOSIS — R569 Unspecified convulsions: Secondary | ICD-10-CM | POA: Diagnosis not present

## 2023-12-22 LAB — CBC WITH DIFFERENTIAL/PLATELET
Abs Immature Granulocytes: 0.03 K/uL (ref 0.00–0.07)
Basophils Absolute: 0 K/uL (ref 0.0–0.1)
Basophils Relative: 0 %
Eosinophils Absolute: 0.1 K/uL (ref 0.0–0.5)
Eosinophils Relative: 1 %
HCT: 31.3 % — ABNORMAL LOW (ref 39.0–52.0)
Hemoglobin: 10 g/dL — ABNORMAL LOW (ref 13.0–17.0)
Immature Granulocytes: 0 %
Lymphocytes Relative: 13 %
Lymphs Abs: 1 K/uL (ref 0.7–4.0)
MCH: 30.2 pg (ref 26.0–34.0)
MCHC: 31.9 g/dL (ref 30.0–36.0)
MCV: 94.6 fL (ref 80.0–100.0)
Monocytes Absolute: 0.8 K/uL (ref 0.1–1.0)
Monocytes Relative: 10 %
Neutro Abs: 5.7 K/uL (ref 1.7–7.7)
Neutrophils Relative %: 76 %
Platelets: 125 K/uL — ABNORMAL LOW (ref 150–400)
RBC: 3.31 MIL/uL — ABNORMAL LOW (ref 4.22–5.81)
RDW: 17.1 % — ABNORMAL HIGH (ref 11.5–15.5)
WBC: 7.6 K/uL (ref 4.0–10.5)
nRBC: 0 % (ref 0.0–0.2)

## 2023-12-22 LAB — MAGNESIUM: Magnesium: 1.8 mg/dL (ref 1.7–2.4)

## 2023-12-22 LAB — COMPREHENSIVE METABOLIC PANEL WITH GFR
ALT: 10 U/L (ref 0–44)
AST: 14 U/L — ABNORMAL LOW (ref 15–41)
Albumin: 3 g/dL — ABNORMAL LOW (ref 3.5–5.0)
Alkaline Phosphatase: 79 U/L (ref 38–126)
Anion gap: 13 (ref 5–15)
BUN: 30 mg/dL — ABNORMAL HIGH (ref 8–23)
CO2: 24 mmol/L (ref 22–32)
Calcium: 8.7 mg/dL — ABNORMAL LOW (ref 8.9–10.3)
Chloride: 103 mmol/L (ref 98–111)
Creatinine, Ser: 1.19 mg/dL (ref 0.61–1.24)
GFR, Estimated: >60 mL/min (ref >60)
Glucose, Bld: 105 mg/dL — ABNORMAL HIGH (ref 70–99)
Potassium: 3.7 mmol/L (ref 3.5–5.1)
Sodium: 140 mmol/L (ref 135–145)
Total Bilirubin: 0.7 mg/dL (ref 0.0–1.2)
Total Protein: 5.9 g/dL — ABNORMAL LOW (ref 6.5–8.1)

## 2023-12-22 LAB — PHOSPHORUS: Phosphorus: 2.3 mg/dL — ABNORMAL LOW (ref 2.5–4.6)

## 2023-12-22 MED ORDER — CEFTRIAXONE SODIUM 1 G IJ SOLR
1.0000 g | INTRAMUSCULAR | Status: DC
  Administered 2023-12-22: 1 g via INTRAVENOUS
  Filled 2023-12-22: qty 10

## 2023-12-22 MED ORDER — GADOBUTROL 1 MMOL/ML IV SOLN
10.0000 mL | Freq: Once | INTRAVENOUS | Status: AC | PRN
  Administered 2023-12-22: 10 mL via INTRAVENOUS

## 2023-12-22 MED ORDER — PANTOPRAZOLE SODIUM 40 MG PO TBEC
40.0000 mg | DELAYED_RELEASE_TABLET | Freq: Two times a day (BID) | ORAL | Status: DC
  Administered 2023-12-22 – 2023-12-23 (×2): 40 mg via ORAL
  Filled 2023-12-22 (×2): qty 1

## 2023-12-22 MED ORDER — LEVETIRACETAM 500 MG PO TABS: 500.0000 mg | ORAL_TABLET | Freq: Two times a day (BID) | ORAL | Status: AC

## 2023-12-22 MED ORDER — CHLORHEXIDINE GLUCONATE CLOTH 2 % EX PADS
6.0000 | MEDICATED_PAD | Freq: Every day | CUTANEOUS | Status: DC
  Administered 2023-12-22 – 2023-12-23 (×2): 6 via TOPICAL

## 2023-12-22 NOTE — Discharge Instructions (Signed)
Seizure precautions: °Per [place] DMV statutes, patients with seizures are not allowed to drive until they have been seizure-free for six months and cleared by a physician  °  °Use caution when using heavy equipment or power tools. Avoid working on ladders or at heights. Take showers instead of baths. Ensure the water temperature is not too high on the home water heater. Do not go swimming alone. Do not lock yourself in a room alone (i.e. bathroom). When caring for infants or small children, sit down when holding, feeding, or changing them to minimize risk of injury to the child in the event you have a seizure. Maintain good sleep hygiene. Avoid alcohol.  °  °If patient has another seizure, call 911 and bring them back to the ED if: °A.  The seizure lasts longer than 5 minutes.      °B.  The patient doesn't wake shortly after the seizure or has new problems such as difficulty seeing, speaking or moving following the seizure °C.  The patient was injured during the seizure °D.  The patient has a temperature over 102 F (39C) °E.  The patient vomited during the seizure and now is having trouble breathing °   °During the Seizure °  °- First, ensure adequate ventilation and place patients on the floor on their left side  °Loosen clothing around the neck and ensure the airway is patent. If the patient is clenching the teeth, do not force the mouth open with any object as this can cause severe damage °- Remove all items from the surrounding that can be hazardous. The patient may be oblivious to what's happening and may not even know what he or she is doing. °If the patient is confused and wandering, either gently guide him/her away and block access to outside areas °- Reassure the individual and be comforting °- Call 911. In most cases, the seizure ends before EMS arrives. However, there are cases when seizures may last over 3 to 5 minutes. Or the individual may have developed breathing difficulties or severe  injuries. If a pregnant patient or a person with diabetes develops a seizure, it is prudent to call an ambulance. °- Finally, if the patient does not regain full consciousness, then call EMS. Most patients will remain confused for about 45 to 90 minutes after a seizure, so you must use judgment in calling for help. °- Avoid restraints but make sure the patient is in a bed with padded side rails °- Place the individual in a lateral position with the neck slightly flexed; this will help the saliva drain from the mouth and prevent the tongue from falling backward °- Remove all nearby furniture and other hazards from the area °- Provide verbal assurance as the individual is regaining consciousness °- Provide the patient with privacy if possible °- Call for help and start treatment as ordered by the caregiver °  ° After the Seizure (Postictal Stage) °  °After a seizure, most patients experience confusion, fatigue, muscle pain and/or a headache. Thus, one should permit the individual to sleep. For the next few days, reassurance is essential. Being calm and helping reorient the person is also of importance. °  °Most seizures are painless and end spontaneously. Seizures are not harmful to others but can lead to complications such as stress on the lungs, brain and the heart. Individuals with prior lung problems may develop labored breathing and respiratory distress.  °  °

## 2023-12-22 NOTE — Progress Notes (Signed)
 PROGRESS NOTE    [name]  FMW:[id number] DOB: 12/28/1945 DOA: 12/20/2023 PCP: Zarwolo, Gloria, FNP  Chief Complaint  Patient presents with   Code Stroke    Brief Narrative:   [name] is [name] 78 y.o. male with medical history significant of hypertension, hypothyroidism, COPD, GERD.  Patient is [name] resident at [facility].  Patient was last known well at 8 AM. He began to have slurred speech, tremors of his extremities with his eyes rolling back.  EMS was called and the patient was brought to the hospital for evaluation.  He was initially brought in as [name] code stroke.  This was changed after conversations with the facility occurred and the patient was found to have had seizure-like activity.  CT and CTA of head neck showed no acute findings.  Keppra  was initiated at neurology recommendation.   Assessment & Plan:   Principal Problem:   New onset seizure (HCC) Active Problems:   COPD (chronic obstructive pulmonary disease) (HCC)   Essential hypertension   Acquired hypothyroidism   Thrombocytopenia (HCC)  New Onset Seizure Appreciate neurology recommendations - keppra   Head CT without acute intracranial abnormality CTA head/neck without LVO, intracranial atherosclerosis with moderate L V4 and mild bilateral ICA stenosis.  Cervical carotid atherosclerosis without significant stenosis.  No evidence of acute infarct or ischemic penumbra.   MRI brain without acute intracranial abnormality EEG without seizures or epileptiform discharges Urine culture notable for UTI - treating as below   Per   [place] statutes, patients with seizures are not allowed to drive until  they have been seizure-free for six months. Use caution when using heavy equipment or power tools. Avoid working on ladders or at heights. Take showers instead of baths. Ensure the water  temperature is not too high on the home water  heater. Do not go swimming alone. When caring for infants or small children,  sit down when holding, feeding, or changing them to minimize risk of injury to the child in the event you have [name] seizure. Also, Maintain good sleep hygiene. Avoid alcohol.  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  Needs ENT follow up outpatient, discussed with provider who will send info to schedulers      DVT prophylaxis: SCD Code Status: DNR Family Communication: none Disposition:   Status is: Inpatient Remains inpatient appropriate because: need for continued inpatient care   Consultants:  neurology  Procedures:  EEG  Antimicrobials:  Anti-infectives (From admission, onward)    Start     Dose/Rate Route Frequency Ordered Stop   12/21/23 2200  trimethoprim  (TRIMPEX ) tablet 100 mg        100 mg Oral Daily at bedtime 12/21/23 1134         Subjective: Sleepy after MRI, no complaints  Objective: Vitals:   12/21/23 1930 12/22/23 0014 12/22/23 0448 12/22/23 0745  BP: 107/60 104/63 121/67 124/60  Pulse: 84 76 87 69  Resp: 18   18  Temp: 98 F (36.7 C) 97.8 F (36.6 C) 97.9 F (36.6 C) 98.1 F (36.7 C)  TempSrc: Oral Oral Oral   SpO2: 99% 94% 96% 96%  Weight:      Height:        Intake/Output Summary (Last 24 hours) at 12/22/2023 1139 Last data filed at 12/22/2023 0600 Gross per 24 hour  Intake 706.63 ml  Output 1200 ml  Net -493.37 ml   Filed  Weights   12/20/23 1419  Weight: 127 kg    Examination:  General: No acute distress. Cardiovascular: RRR Lungs: unlabored Abdomen: Soft, nontender, nondistended Neurological: Alert and oriented, sleepy after valium   Extremities: No clubbing or cyanosis. No edema.   Data Reviewed: I have personally reviewed following labs and imaging studies  CBC: Recent Labs  Lab 12/20/23 1333 12/22/23 0450  WBC 7.8 7.6  NEUTROABS 6.1 5.7  HGB 11.2* 10.0*  HCT 35.9* 31.3*  MCV 96.2 94.6  PLT 134* 125*    Basic Metabolic Panel: Recent Labs  Lab 12/20/23 1333 12/20/23 1424 12/22/23 0450  NA 141  --  140  K 4.0  --  3.7  CL 103  --  103  CO2 27  --  24  GLUCOSE 90  --  105*  BUN 30*  --  30*  CREATININE 0.84  --  1.19  CALCIUM  9.3  --  8.7*  MG  --  1.9 1.8  PHOS  --   --  2.3*    GFR: Estimated Creatinine Clearance: 70.7 mL/min (by C-G formula based on SCr of 1.19 mg/dL).  Liver Function Tests: Recent Labs  Lab 12/20/23 1333 12/22/23 0450  AST 14* 14*  ALT 10 10  ALKPHOS 96 79  BILITOT 0.5 0.7  PROT 6.9 5.9*  ALBUMIN  3.5 3.0*    CBG: Recent Labs  Lab 12/20/23 1333  GLUCAP 81     Recent Results (from the past 240 hours)  Urine Culture     Status: Abnormal (Preliminary result)   Collection Time: 12/21/23  5:01 PM   Specimen: Urine, Random  Result Value Ref Range Status   Specimen Description URINE, RANDOM  Final   Special Requests URINE, CLEAN CATCH  Final   Culture ([place])  Final    >=100,000 COLONIES/mL GRAM NEGATIVE RODS CULTURE REINCUBATED FOR BETTER GROWTH SUSCEPTIBILITIES TO FOLLOW Performed at [facility], [address], [place], [place] [address]    Report Status PENDING  Incomplete         Radiology Studies: MR BRAIN W WO CONTRAST Result Date: 12/22/2023 CLINICAL DATA:  78 year old male code stroke presentation on 12/20/2023. Seizure. EXAM: MRI HEAD WITHOUT AND WITH CONTRAST TECHNIQUE: Multiplanar, multiecho pulse sequences of the  brain and surrounding structures were obtained without and with intravenous contrast. CONTRAST:  10mL GADAVIST  GADOBUTROL  1 MMOL/ML IV SOLN COMPARISON:  CT head and CTA head and neck 12/20/2023. Brain MRI 09/18/2022 and earlier. FINDINGS: Brain: Stable cerebral volume since last year. No restricted diffusion to suggest acute infarction. No midline shift, mass effect, evidence of mass lesion, ventriculomegaly, extra-axial collection or acute intracranial hemorrhage. Cervicomedullary junction and pituitary are within normal limits. Chronic patchy bilateral cerebral white matter T2 and FLAIR hyperintensity. More moderate chronic T2 and FLAIR heterogeneity in the pons. Stable gray and white matter signal throughout the brain. No cortical encephalomalacia or chronic cerebral blood products identified. No abnormal enhancement identified. No dural thickening identified. And on mildly motion degraded thin slice coronal images the hippocampal formations and mesial  temporal lobe structures appear symmetric, within normal limits for age. Vascular: Major intracranial vascular flow voids are preserved, stable since last year. Following contrast the major dural venous sinuses are enhancing and patent. Skull and upper cervical spine: Chronic cervical spine degeneration and postoperative changes are partially visible. Normal background bone marrow signal. Sinuses/Orbits: Stable and negative orbits. Chronic paranasal sinus and right nasal cavity polyp/cysts redemonstrated. Nasopharynx involvement from posterior right nasal cavity lesions appears increased from 1 year earlier (series 6, image 2). Other: Mastoids are clear. Visible internal auditory structures appear normal. 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 IMPRESSION: 1. No acute intracranial abnormality. Stable up to moderate for age chronic  signal changes in the white matter and pons. 2. Chronic Sinonasal Polyposis, with likely benign but progressive posterior nasal cavity polyp or cyst since last year projecting farther into the nasopharynx. Recommend follow-up with ENT. Electronically Signed   By: VEAR Hurst M.D.   On: 12/22/2023 09:47   EEG adult Result Date: 12/21/2023 Shelton Arlin KIDD, MD     12/21/2023  8:18 AM Patient Name: XZAVIAR MALOOF MRN: [id number] Epilepsy Attending: Arlin KIDD Shelton Referring Physician/Provider: Voncile Isles, MD Date: 12/20/2023 Duration: 24.49 mins Patient history: 78yo M with slurred speech, tremors of his extremities with his eyes rolling back. EEG to evaluate for seizure Level of alertness: Awake, asleep AEDs during EEG study: LEV Technical aspects: This EEG study was done with scalp electrodes positioned according to the 10-20 International system of electrode placement. Electrical activity was reviewed with band pass filter of 1-70Hz , sensitivity of 7 uV/mm, display speed of 24mm/sec with Emre Stock 60Hz  notched filter applied as appropriate. EEG data were recorded continuously and digitally stored.  Video monitoring was available and reviewed as appropriate. Description: The posterior dominant rhythm consists of 8 Hz activity of moderate voltage (25-35 uV) seen predominantly in posterior head regions, symmetric and reactive to eye opening and eye closing. Sleep was characterized by vertex waves, sleep spindles (12 to 14 Hz), maximal frontocentral region.  There is intermittent generalized 3 to 6 Hz theta-delta slowing. Physiologic photic driving was not seen during photic stimulation.  Hyperventilation was not performed.   ABNORMALITY - Intermittent slow, generalized IMPRESSION: This study is suggestive of mild diffuse encephalopathy. No seizures or epileptiform discharges were seen throughout the recording. Arlin KIDD Shelton   DG Chest Port 1 View Result Date: 12/20/2023 CLINICAL DATA:  Altered mental status. EXAM:  PORTABLE CHEST 1 VIEW COMPARISON:  July 14, 2021 FINDINGS: The heart size and mediastinal contours are within normal limits. Low lung volumes are noted. Very mild atelectatic changes are seen within the bilateral lung bases. No acute infiltrate, pleural effusion or pneumothorax is identified. Postoperative changes are seen within the cervical spine. IMPRESSION: Low lung volumes with very mild bibasilar atelectasis. Electronically Signed   By: Suzen Dials M.D.   On: 12/20/2023 15:24   CT ANGIO HEAD NECK W WO CM W PERF (CODE STROKE) Result Date: 12/20/2023 CLINICAL DATA:  Neuro deficit, acute, stroke suspected. Slurred speech. EXAM: CT ANGIOGRAPHY HEAD AND NECK CT PERFUSION BRAIN TECHNIQUE: Multidetector CT imaging of the head and neck was performed using the standard protocol during bolus administration of intravenous contrast. Multiplanar CT image reconstructions and MIPs were obtained to evaluate the vascular anatomy. Carotid stenosis measurements (when applicable) are obtained utilizing NASCET criteria, using the distal internal carotid diameter as the denominator. Multiphase CT  imaging of the brain was performed following IV bolus contrast injection. Subsequent parametric perfusion maps were calculated using RAPID software. RADIATION DOSE REDUCTION: This exam was performed according to the departmental dose-optimization program which includes automated exposure control, adjustment of the mA and/or kV according to patient size and/or use of iterative reconstruction technique. CONTRAST:  OMNIPAQUE  IOHEXOL  350 MG/ML SOLN COMPARISON:  CTA head and neck 08/01/2022 FINDINGS: CTA NECK FINDINGS Aortic arch: Standard branching with mild atherosclerosis. Widely patent brachiocephalic and subclavian arteries. Right carotid system: Patent with mixed calcified and soft plaque in the carotid bulb. No evidence of Ferrah Panagopoulos significant stenosis or dissection. Left carotid system: Patent with calcified plaque in the carotid  bulb. No evidence of Eliodoro Gullett significant stenosis or dissection. Vertebral arteries: Patent and codominant without evidence of Kyrstyn Greear significant stenosis or dissection. Skeleton: Prior C4-T2 laminectomies and C4-C7 posterior fusion with similar appearance of Indy Kuck fluid collection in the laminectomy bed compared to the prior CTA, likely postoperative seroma. Multilevel neural foraminal stenosis due to uncovertebral and facet spurring, asymmetrically severe on the left at C3-4 and on the right at C7-T1. 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 Upper chest: Clear lung apices. Review of the MIP images confirms the above findings CTA HEAD FINDINGS Anterior circulation: The internal carotid arteries are patent from skull base to carotid termini with diffuse atherosclerotic calcification resulting in up to mild cavernous and paraclinoid stenosis bilaterally. Branch vessel evaluation is limited by suboptimal contrast timing and venous contamination. The ACAs and MCAs are patent without evidence of Veverly Larimer significant A1 or M1 stenosis or proximal M2 occlusion. No aneurysm is identified. Posterior circulation: The intracranial vertebral arteries are patent to the basilar with calcified plaque resulting in unchanged moderate proximal left V4 stenosis. Patent PICA and SCA origins are visualized bilaterally. The basilar artery is patent with at most mild stenosis proximally. There is Sparrow [place] patent right posterior communicating artery with mild hypoplasia of the right P1 segment. Both PCAs are patent without evidence of Nidhi Jacome significant proximal stenosis. No aneurysm is identified. Venous sinuses: Patent. Anatomic variants: None of significance. Review of the MIP images confirms the above findings CT Brain Perfusion Findings: ASPECTS: 10 CBF (<30%) Volume: 0 mL Perfusion (Tmax>6.0s) volume: 0 mL These results were communicated to Dr. Michaela at 2:19 pm on 12/20/2023 by text page via the [facility] messaging system. IMPRESSION: 1. No large vessel  occlusion. 2. Intracranial atherosclerosis with moderate left V4 and mild bilateral ICA stenoses. 3. Cervical carotid atherosclerosis without significant stenosis. 4. No evidence of an acute infarct or ischemic penumbra on CTP. 5. 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 6.  Aortic Atherosclerosis (ICD10-I70.0). Electronically Signed   By: Dasie Hamburg M.D.   On: 12/20/2023 15:01   CT HEAD CODE STROKE WO CONTRAST Result Date: 12/20/2023 CLINICAL DATA:  Code stroke. Neuro deficit, acute, stroke suspected. Slurred speech. EXAM: CT HEAD WITHOUT CONTRAST TECHNIQUE: Contiguous axial images were obtained from the base of the skull through the vertex without intravenous contrast. RADIATION DOSE REDUCTION: This exam was performed according to the departmental dose-optimization program which includes automated exposure control, adjustment of the mA and/or kV according to patient size and/or use of iterative reconstruction technique. COMPARISON:  Head CT 09/15/2022 and MRI 09/18/2022 FINDINGS: Brain: There is no evidence of an acute infarct, intracranial hemorrhage, mass, midline shift, or extra-axial fluid collection. There is mild cerebral atrophy. Cerebral white matter hypodensities are similar to the prior CT and are nonspecific but  compatible with mild chronic small vessel ischemic disease. Roberto Romanoski chronic lacunar infarct at the posterior aspect of the left putamen is unchanged. Vascular: Calcified atherosclerosis at the skull base. No hyperdense vessel. Skull: No fracture or suspicious lesion. Sinuses/Orbits: Chronic polyp or mucous retention cyst in the right maxillary sinus and suspected polyp in the posterior right nasal cavity. Clear mastoid air cells. Bilateral cataract extraction. Other: None. ASPECTS (Alberta Stroke Program Early CT Score) - Ganglionic level infarction (caudate, lentiform nuclei, internal capsule, insula, M1-M3 cortex): 7 - Supraganglionic infarction  (M4-M6 cortex): 3 Total score (0-10 with 10 being normal): 10 These results were communicated to Dr. Michaela at 1:57 pm on 12/20/2023 by text page via the [facility] messaging system. IMPRESSION: 1. No evidence of acute intracranial abnormality. ASPECTS of 10. 2. Mild chronic small vessel ischemic disease. Electronically Signed   By: Dasie Hamburg M.D.   On: 12/20/2023 13:57        Scheduled Meds:  aspirin   81 mg Oral Daily   cyanocobalamin   1,000 mcg Oral Daily   DULoxetine   60 mg Oral Daily   levETIRAcetam   500 mg Oral BID   Or   levETIRAcetam   500 mg Intravenous BID   levothyroxine   150 mcg Oral QAC breakfast   LORazepam   0.5 mg Oral TID   mouth rinse  15 mL Mouth Rinse Q12H   pantoprazole  (PROTONIX ) IV  40 mg Intravenous Q12H   pregabalin   100 mg Oral BID   risperiDONE   2 mg Oral BID   tamsulosin   0.4 mg Oral BID   trimethoprim   100 mg Oral QHS   Continuous Infusions:     LOS: 2 days    Time spent: over 30 min     Meliton Monte, MD [facility]   To contact the attending provider between 7A-7P or the covering provider during after hours 7P-7A, please log into the web site [email or web address] and access using universal [place] password for that web site. If you do not have the password, please call the hospital operator.  12/22/2023, 11:39 AM

## 2023-12-22 NOTE — Progress Notes (Signed)
 Bladder scan performed on patient post residual and noted 706 ml, of note he has 450 ml on his canister that he just urinated, per order in the bladder scan, foley is inserted as it was more than 300 mls residual. Dr. Marcene updated. CHG and foley care performed after insertion.

## 2023-12-22 NOTE — Progress Notes (Signed)
 Transported to MRI after receiving IV Valium  2.5 mg.

## 2023-12-22 NOTE — Plan of Care (Signed)
 Has a hx of urine cultures + Multi-drug resistance. Alm, IP advised Contact precautions because of this so pt was placed on contact precautions. Daughter [facility]) was notified.    Problem: Clinical Measurements: Goal: Will remain free from infection Outcome: Progressing   Problem: Clinical Measurements: Goal: Diagnostic test results will improve Outcome: Progressing   Problem: Activity: Goal: Risk for activity intolerance will decrease Outcome: Progressing   Problem: Nutrition: Goal: Adequate nutrition will be maintained Outcome: Progressing   Problem: Self-Concept: Goal: Level of anxiety will decrease Outcome: Progressing   Problem: Self-Concept: Goal: Ability to verbalize feelings about condition will improve Outcome: Progressing

## 2023-12-22 NOTE — Progress Notes (Signed)
 Brief Neuro Note:  MRI Brain and rEEG negative. Continue Keppra  500mg  BID. No driving for 6 months. Has to be seizure free for 6 months before he can resume driving.  Neurology will signoff. Plan discussed with Dr. Perri with the hospitalist team.  Curley Hogen [facility]

## 2023-12-22 NOTE — Progress Notes (Signed)
   12/22/23 0600  Provider Notification  Provider Name/Title DR Marcene  Date Provider Notified 12/22/23  Time Provider Notified 0606  Method of Notification Page  Notification Reason Other (Comment) (Patient unable to void)  Provider response See new orders  Date of Provider Response 12/22/23  Time of Provider Response 0606   Patient voided 100cc during shift. He states that he has the urge to void but is unable. Bladder scan >999cc, MD notified. New order received for intermittent cath. Procedure performed and 1100cc of clear yellow urine drained from bladder. Patient verbalized relief.

## 2023-12-22 NOTE — Progress Notes (Signed)
 [facility] night cross cover note:   I was notified by the patient's RN that the patient has spontaneously voided approximately 100 cc over the course of the night shift.  He is reported to feel the urge to urinate, but is unable to void further.  Bladder scan reveals greater than 999 cc's.   I subsequently ordered straight cath x 1 occurrence now.    Eva Pore, DO Hospitalist

## 2023-12-22 NOTE — TOC CM/SW Note (Signed)
 Transition of Care [facility]) - Inpatient Brief Assessment   Patient Details  Name: [name] MRN: [id number] Date of Birth: July 14, 1945  Transition of [facility]) CM/SW Contact:    Almarie CHRISTELLA Goodie, LCSW Phone Number: 12/22/2023, 10:20 AM   Clinical Narrative:    Patient is a LTC resident at [facility] and can return when medically stable. CSW to follow.    Transition of Care Asessment: Insurance and Status: Insurance coverage has been reviewed Patient has primary care physician: Yes Home environment has been reviewed: [employer] Prior level of function:: SNF resident Prior/Current Home Services: No current home services Social Drivers of Health Review: SDOH reviewed no interventions necessary Readmission risk has been reviewed: Yes Transition of care needs: transition of care needs identified, TOC will continue to follow

## 2023-12-23 DIAGNOSIS — R569 Unspecified convulsions: Secondary | ICD-10-CM | POA: Diagnosis not present

## 2023-12-23 LAB — URINE CULTURE: Culture: >=100000 — AB

## 2023-12-23 MED ORDER — FOSFOMYCIN TROMETHAMINE 3 G PO PACK: 3.0000 g | PACK | ORAL | Status: AC

## 2023-12-23 MED ORDER — FOSFOMYCIN TROMETHAMINE 3 G PO PACK
3.0000 g | PACK | ORAL | Status: DC
  Administered 2023-12-23: 3 g via ORAL
  Filled 2023-12-23: qty 3

## 2023-12-23 NOTE — TOC Transition Note (Signed)
 Transition of Care [facility]) - Discharge Note   Patient Details  Name: [name] MRN: [id number] Date of Birth: 01-30-1946  Transition of Care [facility]) CM/SW Contact:  Almarie CHRISTELLA Goodie, LCSW Phone Number: 12/23/2023, 2:25 PM   Clinical Narrative:   CSW updated by MD that patient is stable to return to SNF. CSW sent discharge information to [facility], confirmed they are ready for patient to return. CSW attempted to reach daughter, left a Engineer, technical sales for [employer]. Transport arranged with [facility] for next available.  Nurse to call report to [phone]    Final next level of care: Skilled Nursing Facility Barriers to Discharge: Barriers Resolved   Patient Goals and CMS Choice            Discharge Placement              Patient chooses bed at: [facility] Patient to be transferred to facility by: [facility] Name of family member notified: Voicemail for [facility] Patient and family notified of of transfer: 12/23/23  Discharge Plan and Services Additional resources added to the After Visit Summary for                                       Social Drivers of Health (SDOH) Interventions SDOH Screenings   Food Insecurity: No Food Insecurity (09/18/2021)  Housing: Low Risk  (09/18/2021)  Transportation Needs: No Transportation Needs (09/18/2021)  Alcohol Screen: Low Risk  (09/18/2021)  Depression (PHQ2-9): Low Risk  (07/09/2022)  Recent Concern: Depression (PHQ2-9) - Medium Risk (06/06/2022)  Financial Resource Strain: Low Risk  (09/18/2021)  Physical Activity: Insufficiently Active (09/18/2021)  Social Connections: Moderately Integrated (09/18/2021)  Stress: No Stress Concern Present (09/18/2021)  Tobacco Use: High Risk (09/01/2023)     Readmission Risk Interventions     No data to display

## 2023-12-23 NOTE — Discharge Summary (Signed)
 Physician Discharge Summary  [name] FMW:[id number] DOB: [birth year] DOA: 12/20/2023  PCP: Zarwolo, Gloria, FNP  Admit date: 12/20/2023 Discharge date: 12/23/2023  Time spent: 40 minutes  Recommendations for Outpatient Follow-up:  Follow outpatient CBC/CMP  Follow with neurology outpatient for seizures, continue keppra  Follow with urology outpatient for outpatient trial of void  Complete abx for UTI  Follow bp off midodrine  Discharge Diagnoses:  Principal Problem:   New onset seizure (HCC) Active Problems:   COPD (chronic obstructive pulmonary disease) (HCC)   Essential hypertension   Acquired hypothyroidism   Thrombocytopenia (HCC)   Discharge Condition: stable  Diet recommendation: heart heathy   Filed Weights   12/20/23 1419  Weight: 127 kg    History of present illness:   [name] is [name] 78 y.o. male with medical history significant of hypertension, hypothyroidism, COPD, GERD.  Patient is [name] resident at [facility].  Patient was last known well at 8 AM. He began to have slurred speech, tremors of his extremities with his eyes rolling back.  EMS was called and the patient was brought to the hospital for evaluation.  He was initially brought in as [name] code stroke.  This was changed after conversations with the facility occurred and the patient was found to have had seizure-like activity.  CT and CTA of head neck showed no acute findings.  Keppra  was initiated at neurology recommendation.   MRI without acute findings and EEG was without seizures/epileptiform activities.  He's been started on keppra .  Urine culture showed evidence of UTI and he's been discharged with fosfomycin.  Required indwelling foley for urinary retention, he'll need outpatient urology follow up.  Hospital Course:  Assessment and Plan:  New Onset Seizure Appreciate neurology recommendations - keppra   Head CT without acute intracranial abnormality CTA head/neck without LVO, intracranial  atherosclerosis with moderate L V4 and mild bilateral ICA stenosis.  Cervical carotid atherosclerosis without significant stenosis.  No evidence of acute infarct or ischemic penumbra.   MRI brain without acute intracranial abnormality EEG without seizures or epileptiform discharges Urine culture notable for UTI - treating as below    Per [place] statutes, patients with seizures are not allowed to drive until  they have been seizure-free for six months. Use caution when using heavy equipment or power tools. Avoid working on ladders or at heights. Take showers instead of baths. Ensure the water  temperature is not too high on the home water  heater. Do not go swimming alone. When caring for infants or small children, sit down when holding, feeding, or changing them to minimize risk of injury to the child in the event you have [name] seizure. Also, Maintain good sleep hygiene. Avoid alcohol.   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 - bp ok off this, will d/c at discharge  Acute Urinary Retention Urinary Tract Infection   Required indwelling foley for retention, will need urology follow up for TOV in 7-10 days ESBL and enterococcus UTI - will treat with fosfomycin     Recurrent UTI's Appears he's on nightly trimethoprim     Hypothyroidism Synthroid     Depression  Anxiety Chronic Pain  Cymbalta , lyrica , risperdal   He's on chronic ativan  TID   Hypothyroidism Synthroid     Aortic Atherosclerosis ASA   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 No seizures or  epileptiform discharges were seen throughout the recording.  Consultations: Neurology  Discharge Exam: Vitals:   12/23/23 0738 12/23/23 1133  BP: 117/60 (!) 109/52  Pulse: 86 87  Resp: 18 16  Temp: 98.5 F (36.9 C) 97.9 F (36.6 C)  SpO2: 99% 99%   No new complaints Discussed with step daughter discharge plan  General: No acute distress. Cardiovascular: RRR Lungs: unlabored Abdomen: Soft, nontender, nondistended Neurological: chronic LE weakness Extremities: No clubbing or cyanosis. No edema.   Discharge Instructions   Discharge Instructions     Ambulatory referral to Neurology   Complete by: As directed    An appointment is requested in approximately: 4 weeks   Call MD for:  difficulty breathing, headache or visual disturbances   Complete by: As directed    Call MD for:  difficulty breathing, headache or visual disturbances   Complete by: As directed    Call MD for:  extreme fatigue   Complete by: As directed    Call MD for:  extreme fatigue   Complete by: As directed    Call MD for:  hives   Complete by: As directed    Call MD for:  hives   Complete by: As directed    Call MD for:  persistant dizziness or light-headedness   Complete by: As directed    Call MD for:  persistant dizziness or light-headedness   Complete by: As directed    Call MD for:  persistant nausea and vomiting   Complete by: As directed    Call MD for:  persistant nausea and vomiting   Complete by: As directed    Call MD for:  redness, tenderness, or signs of infection (pain, swelling, redness, odor or green/yellow discharge around incision site)   Complete by: As directed    Call MD for:  redness, tenderness, or signs of infection (pain, swelling, redness, odor or green/yellow discharge around incision site)   Complete by: As directed    Call MD for:  severe uncontrolled pain   Complete by: As directed    Call MD for:  severe uncontrolled pain   Complete by: As directed    Call MD  for:  temperature >100.4   Complete by: As directed    Call MD for:  temperature >100.4   Complete by: As directed    Diet - low sodium heart healthy   Complete by: As directed    Diet - low sodium heart healthy   Complete by: As directed    Discharge instructions   Complete by: As directed    You were seen for seizure like activity.  You've been started on seizure medicine.  This is called keppra .    You had Ulices Maack workup with an EEG and MRI of your brain.  The EEG showed no seizures.  Your MRI did not show any acute findings, but did show chronic sinonasal polyposis.  You have Terrianne Cavness parotid mass, that needs to be followed with ENT as well.  You should follow up with ENT as an outpatient, I'll place Keyen Marban referral.  Per [place] statutes, patients with seizures are not allowed to drive until  they have been seizure-free for six months. Use caution when using heavy equipment or power tools. Avoid working on ladders or at heights. Take showers instead of  baths. Ensure the water  temperature is not too high on the home water  heater. Do not go swimming alone. When caring for infants or small children, sit down when holding, feeding, or changing them to minimize risk of injury to the child in the event you have Ruble Pumphrey seizure. Also, Maintain good sleep hygiene. Avoid alcohol.  You were on midodrine for low blood pressure as an outpatient.  Your blood pressure has been ok without this, I'm going to discontinue this.   Your urine shows evidence of infection.  We've treated you with antibiotics with fosfomycin.  You'll get another dose as an outpatient on 9/11.  You have urinary retention.  You should follow with urology as an outpatient for Brittanee Ghazarian trial of void.  Return for new, recurrent, or worsening symptoms.  Please ask your PCP to request records from this hospitalization so they know what was done and what the next steps will be.   Increase activity slowly   Complete by: As directed    Increase  activity slowly   Complete by: As directed       Allergies as of 12/23/2023       Reactions   Sulfa Antibiotics Other (See Comments)   Causes blisters and skin to peel from inside mouth   Hydrocodone     Misc. Sulfonamide Containing Compounds    Oxycodone          Medication List     STOP taking these medications    midodrine 10 MG tablet Commonly known as: PROAMATINE       TAKE these medications    acetaminophen  325 MG tablet Commonly known as: TYLENOL  Take 650 mg by mouth 3 (three) times daily.   ARTIFICIAL TEARS OP Apply 2 drops to eye every 2 (two) hours as needed (dry eye).   aspirin  81 MG chewable tablet Chew 1 tablet (81 mg total) by mouth daily.   calcium  carbonate 500 MG chewable tablet Commonly known as: TUMS - dosed in mg elemental calcium  Chew 1 tablet by mouth 3 (three) times daily with meals.   cyanocobalamin  1000 MCG tablet Commonly known as: VITAMIN B12 Take 1,000 mcg by mouth daily.   DULoxetine  60 MG capsule Commonly known as: CYMBALTA  Take 60 mg by mouth daily.   ferrous sulfate  325 (65 FE) MG tablet Take 325 mg by mouth See admin instructions. Once daily only Every Mon, Wed, and Fri   fosfomycin 3 g Pack Commonly known as: MONUROL  Take 3 g by mouth every other day for 1 dose. Start taking on: December 25, 2023   levETIRAcetam  500 MG tablet Commonly known as: KEPPRA  Take 1 tablet (500 mg total) by mouth 2 (two) times daily.   levothyroxine  150 MCG tablet Commonly known as: SYNTHROID  TAKE 1 TABLET BY MOUTH DAILY BEFORE BREAKFAST   loperamide  2 MG tablet Commonly known as: IMODIUM  Estalene Bergey-D Take 2 mg by mouth 4 (four) times daily as needed for diarrhea or loose stools.   LORazepam  0.5 MG tablet Commonly known as: ATIVAN  Take 0.5 mg by mouth in the morning, at noon, and at bedtime.   Magnesium  Oxide 400 MG Caps Take 2 capsules (800 mg total) by mouth daily.   pantoprazole  40 MG tablet Commonly known as: PROTONIX  Take 1 tablet (40  mg total) by mouth 2 (two) times daily.   polyethylene glycol 17 g packet Commonly known as: MIRALAX  / GLYCOLAX  Take 17 g by mouth daily. Hold for 24 hours if watery stools, or more than 3 stools daily.  pregabalin  100 MG capsule Commonly known as: Lyrica  Take 1 capsule (100 mg total) by mouth 2 (two) times daily.   PRESERVISION AREDS 2 PO Take 1 capsule by mouth daily.   risperiDONE  2 MG tablet Commonly known as: RISPERDAL  Take 2 mg by mouth 2 (two) times daily.   senna 8.6 MG Tabs tablet Commonly known as: SENOKOT Take 1 tablet (8.6 mg total) by mouth daily.   simvastatin  20 MG tablet Commonly known as: ZOCOR  TAKE 1 TABLET BY MOUTH AT BEDTIME   sucralfate 1 g tablet Commonly known as: CARAFATE Take 1 g by mouth 2 (two) times daily.   tamsulosin  0.4 MG Caps capsule Commonly known as: FLOMAX  Take 1 capsule (0.4 mg total) by mouth 2 (two) times daily.   triamcinolone  cream 0.1 % Commonly known as: KENALOG  Apply 1 Application topically every 12 (twelve) hours as needed (itching/redness).   trimethoprim  100 MG tablet Commonly known as: TRIMPEX  Take 1 tablet (100 mg total) by mouth at bedtime.   Vitamin D3 1.25 MG (50000 UT) Tabs Take 50,000 Units by mouth every Wednesday.       Allergies  Allergen Reactions   Sulfa Antibiotics Other (See Comments)    Causes blisters and skin to peel from inside mouth   Hydrocodone     Misc. Sulfonamide Containing Compounds    Oxycodone        The results of significant diagnostics from this hospitalization (including imaging, microbiology, ancillary and laboratory) are listed below for reference.    Significant Diagnostic Studies: MR BRAIN W WO CONTRAST Result Date: 12/22/2023 CLINICAL DATA:  78 year old male code stroke presentation on 12/20/2023. Seizure. EXAM: MRI HEAD WITHOUT AND WITH CONTRAST TECHNIQUE: Multiplanar, multiecho pulse sequences of the brain and surrounding structures were obtained without and with  intravenous contrast. CONTRAST:  10mL GADAVIST  GADOBUTROL  1 MMOL/ML IV SOLN COMPARISON:  CT head and CTA head and neck 12/20/2023. Brain MRI 09/18/2022 and earlier. FINDINGS: Brain: Stable cerebral volume since last year. No restricted diffusion to suggest acute infarction. No midline shift, mass effect, evidence of mass lesion, ventriculomegaly, extra-axial collection or acute intracranial hemorrhage. Cervicomedullary junction and pituitary are within normal limits. Chronic patchy bilateral cerebral white matter T2 and FLAIR hyperintensity. More moderate chronic T2 and FLAIR heterogeneity in the pons. Stable gray and white matter signal throughout the brain. No cortical encephalomalacia or chronic cerebral blood products identified. No abnormal enhancement identified. No dural thickening identified. And on mildly motion degraded thin slice coronal images the hippocampal formations and mesial temporal lobe structures appear symmetric, within normal limits for age. Vascular: Major intracranial vascular flow voids are preserved, stable since last year. Following contrast the major dural venous sinuses are enhancing and patent. Skull and upper cervical spine: Chronic cervical spine degeneration and postoperative changes are partially visible. Normal background bone marrow signal. Sinuses/Orbits: Stable and negative orbits. Chronic paranasal sinus and right nasal cavity polyp/cysts redemonstrated. Nasopharynx involvement from posterior right nasal cavity lesions appears increased from 1 year earlier (series 6, image 2). Other: Mastoids are clear. Visible internal auditory structures appear normal. 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 IMPRESSION: 1. No acute intracranial abnormality. Stable up to moderate for age chronic signal changes in the white matter and pons. 2. Chronic Sinonasal  Polyposis, with likely benign but progressive posterior nasal cavity polyp or cyst since last year projecting farther into the nasopharynx. Recommend follow-up with ENT. Electronically Signed  By: VEAR Hurst M.D.   On: 12/22/2023 09:47   EEG adult Result Date: 12/21/2023 Shelton Arlin KIDD, MD     12/21/2023  8:18 AM Patient Name: TAB RYLEE MRN: [id number] Epilepsy Attending: Arlin KIDD Shelton Referring Physician/Provider: Voncile Isles, MD Date: 12/20/2023 Duration: 24.49 mins Patient history: 78yo M with slurred speech, tremors of his extremities with his eyes rolling back. EEG to evaluate for seizure Level of alertness: Awake, asleep AEDs during EEG study: LEV Technical aspects: This EEG study was done with scalp electrodes positioned according to the 10-20 International system of electrode placement. Electrical activity was reviewed with band pass filter of 1-70Hz , sensitivity of 7 uV/mm, display speed of 57mm/sec with Karthik Whittinghill 60Hz  notched filter applied as appropriate. EEG data were recorded continuously and digitally stored.  Video monitoring was available and reviewed as appropriate. Description: The posterior dominant rhythm consists of 8 Hz activity of moderate voltage (25-35 uV) seen predominantly in posterior head regions, symmetric and reactive to eye opening and eye closing. Sleep was characterized by vertex waves, sleep spindles (12 to 14 Hz), maximal frontocentral region.  There is intermittent generalized 3 to 6 Hz theta-delta slowing. Physiologic photic driving was not seen during photic stimulation.  Hyperventilation was not performed.   ABNORMALITY - Intermittent slow, generalized IMPRESSION: This study is suggestive of mild diffuse encephalopathy. No seizures or epileptiform discharges were seen throughout the recording. Arlin KIDD Shelton   DG Chest Port 1 View Result Date: 12/20/2023 CLINICAL DATA:  Altered mental status. EXAM: PORTABLE CHEST 1 VIEW COMPARISON:  July 14, 2021 FINDINGS: The heart  size and mediastinal contours are within normal limits. Low lung volumes are noted. Very mild atelectatic changes are seen within the bilateral lung bases. No acute infiltrate, pleural effusion or pneumothorax is identified. Postoperative changes are seen within the cervical spine. IMPRESSION: Low lung volumes with very mild bibasilar atelectasis. Electronically Signed   By: Suzen Dials M.D.   On: 12/20/2023 15:24   CT ANGIO HEAD NECK W WO CM W PERF (CODE STROKE) Result Date: 12/20/2023 CLINICAL DATA:  Neuro deficit, acute, stroke suspected. Slurred speech. EXAM: CT ANGIOGRAPHY HEAD AND NECK CT PERFUSION BRAIN TECHNIQUE: Multidetector CT imaging of the head and neck was performed using the standard protocol during bolus administration of intravenous contrast. Multiplanar CT image reconstructions and MIPs were obtained to evaluate the vascular anatomy. Carotid stenosis measurements (when applicable) are obtained utilizing NASCET criteria, using the distal internal carotid diameter as the denominator. Multiphase CT imaging of the brain was performed following IV bolus contrast injection. Subsequent parametric perfusion maps were calculated using RAPID software. RADIATION DOSE REDUCTION: This exam was performed according to the departmental dose-optimization program which includes automated exposure control, adjustment of the mA and/or kV according to patient size and/or use of iterative reconstruction technique. CONTRAST:  OMNIPAQUE  IOHEXOL  350 MG/ML SOLN COMPARISON:  CTA head and neck 08/01/2022 FINDINGS: CTA NECK FINDINGS Aortic arch: Standard branching with mild atherosclerosis. Widely patent brachiocephalic and subclavian arteries. Right carotid system: Patent with mixed calcified and soft plaque in the carotid bulb. No evidence of Tenesha Garza significant stenosis or dissection. Left carotid system: Patent with calcified plaque in the carotid bulb. No evidence of Vaneta Hammontree significant stenosis or dissection. Vertebral  arteries: Patent and codominant without evidence of Elvi Leventhal significant stenosis or dissection. Skeleton: Prior C4-T2 laminectomies and C4-C7 posterior fusion with similar appearance of Abhishek Levesque fluid collection in the laminectomy bed compared to the prior CTA, likely postoperative seroma. Multilevel neural foraminal stenosis  due to uncovertebral and facet spurring, asymmetrically severe on the left at C3-4 and on the right at C7-T1. 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 Upper chest: Clear lung apices. Review of the MIP images confirms the above findings CTA HEAD FINDINGS Anterior circulation: The internal carotid arteries are patent from skull base to carotid termini with diffuse atherosclerotic calcification resulting in up to mild cavernous and paraclinoid stenosis bilaterally. Branch vessel evaluation is limited by suboptimal contrast timing and venous contamination. The ACAs and MCAs are patent without evidence of Amaad Byers significant A1 or M1 stenosis or proximal M2 occlusion. No aneurysm is identified. Posterior circulation: The intracranial vertebral arteries are patent to the basilar with calcified plaque resulting in unchanged moderate proximal left V4 stenosis. Patent PICA and SCA origins are visualized bilaterally. The basilar artery is patent with at most mild stenosis proximally. There is Shaiann Mcmanamon patent right posterior communicating artery with mild hypoplasia of the right P1 segment. Both PCAs are patent without evidence of Vinh Sachs significant proximal stenosis. No aneurysm is identified. Venous sinuses: Patent. Anatomic variants: None of significance. Review of the MIP images confirms the above findings CT Brain Perfusion Findings: ASPECTS: 10 CBF (<30%) Volume: 0 mL Perfusion (Tmax>6.0s) volume: 0 mL These results were communicated to Dr. Michaela at 2:19 pm on 12/20/2023 by text page via the [facility] messaging system. IMPRESSION: 1. No large vessel occlusion. 2. Intracranial atherosclerosis with moderate left V4 and mild  bilateral ICA stenoses. 3. Cervical carotid atherosclerosis without significant stenosis. 4. No evidence of an acute infarct or ischemic penumbra on CTP. 5. 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 6.  Aortic Atherosclerosis (ICD10-I70.0). Electronically Signed   By: Dasie Hamburg M.D.   On: 12/20/2023 15:01   CT HEAD CODE STROKE WO CONTRAST Result Date: 12/20/2023 CLINICAL DATA:  Code stroke. Neuro deficit, acute, stroke suspected. Slurred speech. EXAM: CT HEAD WITHOUT CONTRAST TECHNIQUE: Contiguous axial images were obtained from the base of the skull through the vertex without intravenous contrast. RADIATION DOSE REDUCTION: This exam was performed according to the departmental dose-optimization program which includes automated exposure control, adjustment of the mA and/or kV according to patient size and/or use of iterative reconstruction technique. COMPARISON:  Head CT 09/15/2022 and MRI 09/18/2022 FINDINGS: Brain: There is no evidence of an acute infarct, intracranial hemorrhage, mass, midline shift, or extra-axial fluid collection. There is mild cerebral atrophy. Cerebral white matter hypodensities are similar to the prior CT and are nonspecific but compatible with mild chronic small vessel ischemic disease. Stina Gane chronic lacunar infarct at the posterior aspect of the left putamen is unchanged. Vascular: Calcified atherosclerosis at the skull base. No hyperdense vessel. Skull: No fracture or suspicious lesion. Sinuses/Orbits: Chronic polyp or mucous retention cyst in the right maxillary sinus and suspected polyp in the posterior right nasal cavity. Clear mastoid air cells. Bilateral cataract extraction. Other: None. ASPECTS ([place]ta Stroke Program Early CT Score) - Ganglionic level infarction (caudate, lentiform nuclei, internal capsule, insula, M1-M3 cortex): 7 - Supraganglionic infarction (M4-M6 cortex): 3 Total score (0-10 with 10 being normal): 10 These results  were communicated to Dr. Michaela at 1:57 pm on 12/20/2023 by text page via the [facility] messaging system. IMPRESSION: 1. No evidence of acute intracranial abnormality. ASPECTS of 10. 2. Mild chronic small vessel ischemic disease. Electronically Signed   By: Dasie Hamburg M.D.   On: 12/20/2023 13:57    Microbiology: Recent Results (from the past 240 hours)  Urine Culture  Status: Abnormal   Collection Time: 12/21/23  5:01 PM   Specimen: Urine, Random  Result Value Ref Range Status   Specimen Description URINE, RANDOM  Final   Special Requests   Final    URINE, CLEAN CATCH Performed at [facility], [address], [place], [place] [address]    Culture (Declan Mier)  Final    >=100,000 COLONIES/mL ESCHERICHIA COLI 60,000 COLONIES/mL ENTEROCOCCUS FAECALIS Confirmed Extended Spectrum Beta-Lactamase Producer (ESBL).  In bloodstream infections from ESBL organisms, carbapenems are preferred over piperacillin/tazobactam. They are shown to have Akeel Reffner lower risk of mortality.    Report Status 12/23/2023 FINAL  Final   Organism ID, Bacteria ESCHERICHIA COLI (Nurah Petrides)  Final   Organism ID, Bacteria ENTEROCOCCUS FAECALIS (Almira Phetteplace)  Final      Susceptibility   Escherichia coli - MIC*    AMPICILLIN >=32 RESISTANT Resistant     CEFAZOLIN  (URINE) Value in next row Resistant      >=32 RESISTANTThis is Rosaleen Mazer modified FDA-approved test that has been validated and its performance characteristics determined by the reporting laboratory.  This laboratory is certified under the Clinical Laboratory Improvement Amendments CLIA as qualified to perform high complexity clinical laboratory testing.    CEFEPIME  Value in next row Resistant      >=32 RESISTANTThis is Xai Frerking modified FDA-approved test that has been validated and its performance characteristics determined by the reporting laboratory.  This laboratory is certified under the Clinical Laboratory Improvement Amendments CLIA as qualified to perform high complexity clinical laboratory  testing.    CEFTRIAXONE  Value in next row Resistant      >=32 RESISTANTThis is Zamira Hickam modified FDA-approved test that has been validated and its performance characteristics determined by the reporting laboratory.  This laboratory is certified under the Clinical Laboratory Improvement Amendments CLIA as qualified to perform high complexity clinical laboratory testing.    CIPROFLOXACIN  Value in next row Resistant      >=32 RESISTANTThis is Latoya Maulding modified FDA-approved test that has been validated and its performance characteristics determined by the reporting laboratory.  This laboratory is certified under the Clinical Laboratory Improvement Amendments CLIA as qualified to perform high complexity clinical laboratory testing.    GENTAMICIN Value in next row Sensitive      >=32 RESISTANTThis is Fayne Mcguffee modified FDA-approved test that has been validated and its performance characteristics determined by the reporting laboratory.  This laboratory is certified under the Clinical Laboratory Improvement Amendments CLIA as qualified to perform high complexity clinical laboratory testing.    NITROFURANTOIN  Value in next row Sensitive      >=32 RESISTANTThis is Lynnann Knudsen modified FDA-approved test that has been validated and its performance characteristics determined by the reporting laboratory.  This laboratory is certified under the Clinical Laboratory Improvement Amendments CLIA as qualified to perform high complexity clinical laboratory testing.    TRIMETH/SULFA Value in next row Resistant      >=32 RESISTANTThis is Destini Cambre modified FDA-approved test that has been validated and its performance characteristics determined by the reporting laboratory.  This laboratory is certified under the Clinical Laboratory Improvement Amendments CLIA as qualified to perform high complexity clinical laboratory testing.    AMPICILLIN/SULBACTAM Value in next row Resistant      >=32 RESISTANTThis is Tamu Golz modified FDA-approved test that has been validated and its  performance characteristics determined by the reporting laboratory.  This laboratory is certified under the Clinical Laboratory Improvement Amendments CLIA as qualified to perform high complexity clinical laboratory testing.    PIP/TAZO Value in next  row Intermediate ug/mL     64 INTERMEDIATEThis is Sondra Blixt modified FDA-approved test that has been validated and its performance characteristics determined by the reporting laboratory.  This laboratory is certified under the Clinical Laboratory Improvement Amendments CLIA as qualified to perform high complexity clinical laboratory testing.    MEROPENEM Value in next row Sensitive      64 INTERMEDIATEThis is Alson Mcpheeters modified FDA-approved test that has been validated and its performance characteristics determined by the reporting laboratory.  This laboratory is certified under the Clinical Laboratory Improvement Amendments CLIA as qualified to perform high complexity clinical laboratory testing.    * >=100,000 COLONIES/mL ESCHERICHIA COLI   Enterococcus faecalis - MIC*    AMPICILLIN Value in next row Sensitive      64 INTERMEDIATEThis is Jermia Rigsby modified FDA-approved test that has been validated and its performance characteristics determined by the reporting laboratory.  This laboratory is certified under the Clinical Laboratory Improvement Amendments CLIA as qualified to perform high complexity clinical laboratory testing.    NITROFURANTOIN  Value in next row Sensitive      64 INTERMEDIATEThis is Sina Sumpter modified FDA-approved test that has been validated and its performance characteristics determined by the reporting laboratory.  This laboratory is certified under the Clinical Laboratory Improvement Amendments CLIA as qualified to perform high complexity clinical laboratory testing.    VANCOMYCIN  Value in next row Sensitive      64 INTERMEDIATEThis is Annleigh Knueppel modified FDA-approved test that has been validated and its performance characteristics determined by the reporting laboratory.  This  laboratory is certified under the Clinical Laboratory Improvement Amendments CLIA as qualified to perform high complexity clinical laboratory testing.    * 60,000 COLONIES/mL ENTEROCOCCUS FAECALIS     Labs: Basic Metabolic Panel: Recent Labs  Lab 12/20/23 1333 12/20/23 1424 12/22/23 0450  NA 141  --  140  K 4.0  --  3.7  CL 103  --  103  CO2 27  --  24  GLUCOSE 90  --  105*  BUN 30*  --  30*  CREATININE 0.84  --  1.19  CALCIUM  9.3  --  8.7*  MG  --  1.9 1.8  PHOS  --   --  2.3*   Liver Function Tests: Recent Labs  Lab 12/20/23 1333 12/22/23 0450  AST 14* 14*  ALT 10 10  ALKPHOS 96 79  BILITOT 0.5 0.7  PROT 6.9 5.9*  ALBUMIN  3.5 3.0*   No results for input(s): LIPASE, AMYLASE in the last 168 hours. No results for input(s): AMMONIA in the last 168 hours. CBC: Recent Labs  Lab 12/20/23 1333 12/22/23 0450  WBC 7.8 7.6  NEUTROABS 6.1 5.7  HGB 11.2* 10.0*  HCT 35.9* 31.3*  MCV 96.2 94.6  PLT 134* 125*   Cardiac Enzymes: No results for input(s): CKTOTAL, CKMB, CKMBINDEX, TROPONINI in the last 168 hours. BNP: BNP (last 3 results) No results for input(s): BNP in the last 8760 hours.  ProBNP (last 3 results) No results for input(s): PROBNP in the last 8760 hours.  CBG: Recent Labs  Lab 12/20/23 1333  GLUCAP 81       Signed:  Meliton Monte MD.  [facility] 12/23/2023, 1:02 PM

## 2023-12-23 NOTE — Progress Notes (Signed)
 Discharged to facility after report was called to facility by [employer] nurse.  Foley Cath intact, as pt was unable to void on his own.

## 2023-12-23 NOTE — Progress Notes (Signed)
 This nurse came to give morning medicine to patient when he started lifting his gown and when asked why he said he is just showing it to his dad while looking at the ceiling. He also stated that his sister is by the recliner and that his brother is right beside this nurse. Re-oriented this patient that he is alone in the room with only me as his nurse. Patient is still oriented to self but still disoriented to place, time and situation. Repeatedly asking this nurse where he is at the moment.Dr. Marcene informed of the hallucinations.

## 2023-12-23 NOTE — Plan of Care (Signed)
  Problem: Clinical Measurements: Goal: Diagnostic test results will improve Outcome: Not Progressing   Problem: Elimination: Goal: Will not experience complications related to bowel motility Outcome: Not Progressing   Problem: Elimination: Goal: Will not experience complications related to urinary retention Outcome: Not Progressing   Problem: Safety: Goal: Ability to remain free from injury will improve Outcome: Not Progressing   Problem: Skin Integrity: Goal: Risk for impaired skin integrity will decrease Outcome: Not Progressing   Problem: Self-Concept: Goal: Level of anxiety will decrease Outcome: Not Progressing Goal: Ability to verbalize feelings about condition will improve Outcome: Not Progressing

## 2023-12-24 ENCOUNTER — Telehealth (INDEPENDENT_AMBULATORY_CARE_PROVIDER_SITE_OTHER): Payer: Self-pay | Admitting: Physician Assistant

## 2023-12-24 ENCOUNTER — Telehealth: Payer: Self-pay

## 2023-12-24 NOTE — Transitions of Care (Post Inpatient/ED Visit) (Unsigned)
   12/24/2023  Name: [name] MRN: [id number] DOB: 07/25/1945  Today's TOC FU Call Status: Today's TOC FU Call Status:: Unsuccessful Call (1st Attempt) Unsuccessful Call (1st Attempt) Date: 12/24/23  Attempted to reach the patient regarding the most recent Inpatient/ED visit.  Follow Up Plan: Additional outreach attempts will be made to reach the patient to complete the Transitions of Care (Post Inpatient/ED visit) call.   Signature Julian Lemmings, LPN [facility] Nurse Health Advisor Direct Dial [phone]

## 2023-12-24 NOTE — Telephone Encounter (Signed)
 Called patient POA (daughter) and left voicemail to schedule ED follow up appointment with any provider for Nasal polyps per Jeff's request.

## 2023-12-25 NOTE — Transitions of Care (Post Inpatient/ED Visit) (Unsigned)
   12/25/2023  Name: [name] MRN: [id number] DOB: [birth year]  Today's TOC FU Call Status: Today's TOC FU Call Status:: Unsuccessful Call (2nd Attempt) Unsuccessful Call (1st Attempt) Date: 12/24/23 Unsuccessful Call (2nd Attempt) Date: 12/25/23  Attempted to reach the patient regarding the most recent Inpatient/ED visit.  Follow Up Plan: Additional outreach attempts will be made to reach the patient to complete the Transitions of Care (Post Inpatient/ED visit) call.   Signature Julian Lemmings, LPN [facility] Nurse Health Advisor Direct Dial [phone]

## 2023-12-26 NOTE — Transitions of Care (Post Inpatient/ED Visit) (Signed)
   12/26/2023  Name: [name] MRN: [id number] DOB: October 10, 1945  Today's TOC FU Call Status: Today's TOC FU Call Status:: Unsuccessful Call (3rd Attempt) Unsuccessful Call (1st Attempt) Date: 12/24/23 Unsuccessful Call (2nd Attempt) Date: 12/25/23 Unsuccessful Call (3rd Attempt) Date: 12/26/23  Attempted to reach the patient regarding the most recent Inpatient/ED visit.  Follow Up Plan: No further outreach attempts will be made at this time. We have been unable to contact the patient.  Signature Julian Lemmings, LPN [facility] Nurse Health Advisor Direct Dial [phone]

## 2024-01-26 ENCOUNTER — Telehealth: Payer: Self-pay

## 2024-01-26 NOTE — Telephone Encounter (Signed)
 Belinda from [facility] called to confirm patient's upcoming appointment.

## 2024-01-26 NOTE — Progress Notes (Signed)
 Impression: - Recurrent urinary tract infections.  He seems to be emptying out well.  No urothelial lesions, no evidence of obstruction  -Microscopic hematuria with negative cystoscopy  Plan:   History of Present Illness:  This 78 year old male comes in today for follow-up.  Previously, he has seen Lauraine Oz, NP.  He has BPH with lower urinary tract symptoms.  Additionally, he has recurrent urinary tract infections.  Most recently treated for a multidrug-resistant E. coli with Cipro .  He has had renal ultrasound performed in late May of this year revealing bilateral renal cysts, no hydronephrosis, debris layering dependently within the bladder.  He also has recurrent microscopic hematuria.  7.8.2025: Emmett is here for cysto. At his visit 2 weeks ago he had a MDR e.coli--treated w/ monurol . 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   Nausea vomiting and diarrhea 09/14/2022   Restless leg syndrome     Past Surgical History:  Procedure Laterality Date   BACK SURGERY     Fusion 2005   BIOPSY  09/18/2022   Procedure: BIOPSY;  Surgeon: Shila Gustav GAILS, MD;  Location: [facility] ENDOSCOPY;  Service: Gastroenterology;;   COLONOSCOPY WITH PROPOFOL  N/A 11/29/2022   Procedure: COLONOSCOPY WITH PROPOFOL ;  Surgeon: Cindie Carlin POUR, DO;  Location: [facility] ENDO SUITE;  Service: Endoscopy;  Laterality: N/A;  145pm, asa 3, [place] [facility]   COLONOSCOPY WITH PROPOFOL  N/A 05/26/2023   Procedure: COLONOSCOPY WITH PROPOFOL ;  Surgeon: Cindie Carlin POUR, DO;  Location: [facility] ENDO SUITE;  Service: Endoscopy;  Laterality: N/A;  8:15 am, asa 3   ESOPHAGOGASTRODUODENOSCOPY (EGD) WITH PROPOFOL  N/A 09/18/2022   Procedure:  ESOPHAGOGASTRODUODENOSCOPY (EGD) WITH PROPOFOL ;  Surgeon: Shila Gustav GAILS, MD;  Location: [facility] ENDOSCOPY;  Service: Gastroenterology;  Laterality: N/A;   Graves Disease     JOINT REPLACEMENT  1970   Right knee    LAMINECTOMY WITH POSTERIOR LATERAL ARTHRODESIS LEVEL 2 N/A 06/29/2021   Procedure: Laminectomy - Lumbar one-two, Lumbar two-three - redo with posterior lateral fusion with pedicle screws;  Surgeon: Louis Shove, MD;  Location: [facility] OR;  Service: Neurosurgery;  Laterality: N/A;   NECK SURGERY     POSTERIOR CERVICAL FUSION/FORAMINOTOMY  12/05/2011   Procedure: POSTERIOR CERVICAL FUSION/FORAMINOTOMY LEVEL 4;  Surgeon: Darina MALVA Boehringer, MD;  Location: [facility] NEURO ORS;  Service: Neurosurgery;  Laterality: N/A;  Posterior Cervical fixation Cervical four-Seven,Cervical Decompresson Cervical three   THORACIC DISCECTOMY N/A 01/08/2021   Procedure: Thoracic One-Two, Thoracic Two-Three, Thoracic Eleven-Twelve, Thoracic Twelve-Lumbar One Laminectomy;  Surgeon: Louis Shove, MD;  Location: [facility] OR;  Service: Neurosurgery;  Laterality: N/A;    Home Medications:  Allergies as of 01/27/2024       Reactions   Sulfa Antibiotics Other (See Comments)   Causes blisters and skin to peel from inside mouth   Hydrocodone     Misc. Sulfonamide Containing Compounds    Oxycodone          Medication List        Accurate as of January 26, 2024  4:06 PM. If you have any questions, ask your nurse or doctor.          acetaminophen  325  MG tablet Commonly known as: TYLENOL  Take 650 mg by mouth 3 (three) times daily.   ARTIFICIAL TEARS OP Apply 2 drops to eye every 2 (two) hours as needed (dry eye).   aspirin  81 MG chewable tablet Chew 1 tablet (81 mg total) by mouth daily.   calcium  carbonate 500 MG chewable tablet Commonly known as: TUMS - dosed in mg elemental calcium  Chew 1 tablet by mouth 3 (three) times daily with meals.   cyanocobalamin  1000 MCG tablet Commonly known as: VITAMIN B12 Take 1,000 mcg  by mouth daily.   DULoxetine  60 MG capsule Commonly known as: CYMBALTA  Take 60 mg by mouth daily.   ferrous sulfate  325 (65 FE) MG tablet Take 325 mg by mouth See admin instructions. Once daily only Every Mon, Wed, and Fri   levETIRAcetam  500 MG tablet Commonly known as: KEPPRA  Take 1 tablet (500 mg total) by mouth 2 (two) times daily.   levothyroxine  150 MCG tablet Commonly known as: SYNTHROID  TAKE 1 TABLET BY MOUTH DAILY BEFORE BREAKFAST   loperamide  2 MG tablet Commonly known as: IMODIUM  A-D Take 2 mg by mouth 4 (four) times daily as needed for diarrhea or loose stools.   LORazepam  0.5 MG tablet Commonly known as: ATIVAN  Take 0.5 mg by mouth in the morning, at noon, and at bedtime.   Magnesium  Oxide 400 MG Caps Take 2 capsules (800 mg total) by mouth daily.   pantoprazole  40 MG tablet Commonly known as: PROTONIX  Take 1 tablet (40 mg total) by mouth 2 (two) times daily.   polyethylene glycol 17 g packet Commonly known as: MIRALAX  / GLYCOLAX  Take 17 g by mouth daily. Hold for 24 hours if watery stools, or more than 3 stools daily.   pregabalin  100 MG capsule Commonly known as: Lyrica  Take 1 capsule (100 mg total) by mouth 2 (two) times daily.   PRESERVISION AREDS 2 PO Take 1 capsule by mouth daily.   risperiDONE  2 MG tablet Commonly known as: RISPERDAL  Take 2 mg by mouth 2 (two) times daily.   senna 8.6 MG Tabs tablet Commonly known as: SENOKOT Take 1 tablet (8.6 mg total) by mouth daily.   simvastatin  20 MG tablet Commonly known as: ZOCOR  TAKE 1 TABLET BY MOUTH AT BEDTIME   sucralfate 1 g tablet Commonly known as: CARAFATE Take 1 g by mouth 2 (two) times daily.   tamsulosin  0.4 MG Caps capsule Commonly known as: FLOMAX  Take 1 capsule (0.4 mg total) by mouth 2 (two) times daily.   triamcinolone  cream 0.1 % Commonly known as: KENALOG  Apply 1 Application topically every 12 (twelve) hours as needed (itching/redness).   trimethoprim  100 MG  tablet Commonly known as: TRIMPEX  Take 1 tablet (100 mg total) by mouth at bedtime.   Vitamin D3 1.25 MG (50000 UT) Tabs Take 50,000 Units by mouth every Wednesday.        Allergies:  Allergies  Allergen Reactions   Sulfa Antibiotics Other (See Comments)    Causes blisters and skin to peel from inside mouth   Hydrocodone     Misc. Sulfonamide Containing Compounds    Oxycodone      Family History  Problem Relation Age of Onset   Heart attack Mother 71   Heart disease Father     Social History:  reports that he has been smoking cigarettes. He has a 12.5 pack-year smoking history. He has never used smokeless tobacco. He reports that he does not currently use drugs. He reports that he does not drink alcohol.  ROS: A complete review of systems was performed.  All systems are negative except for pertinent findings as noted.  Physical Exam:  Vital signs in last 24 hours: There were no vitals taken for this visit. Constitutional:  Alert and oriented, No acute distress.  He is in wheelchair.  Unable to stand on his own. Cardiovascular: Regular rate  Respiratory: Normal respiratory effort Psychiatric: Normal mood and affect  Catheterized urine obtained today.  I have reviewed prior pt notes  I have reviewed urinalysis results  I have independently reviewed prior imaging--renal ultrasound done recently, CT scan from June 2024, renal ultrasound images from last month  I have reviewed prior urine cultures  Bladder irrigated until clear

## 2024-01-27 ENCOUNTER — Ambulatory Visit: Admitting: Urology

## 2024-01-27 VITALS — BP 115/70 | HR 67

## 2024-01-27 DIAGNOSIS — Z87440 Personal history of urinary (tract) infections: Secondary | ICD-10-CM | POA: Diagnosis not present

## 2024-01-27 DIAGNOSIS — R339 Retention of urine, unspecified: Secondary | ICD-10-CM

## 2024-01-27 DIAGNOSIS — R3129 Other microscopic hematuria: Secondary | ICD-10-CM | POA: Diagnosis not present

## 2024-01-27 DIAGNOSIS — N401 Enlarged prostate with lower urinary tract symptoms: Secondary | ICD-10-CM

## 2024-01-27 DIAGNOSIS — N3000 Acute cystitis without hematuria: Secondary | ICD-10-CM

## 2024-01-27 MED ORDER — CIPROFLOXACIN HCL 500 MG PO TABS
500.0000 mg | ORAL_TABLET | Freq: Once | ORAL | Status: AC
  Administered 2024-01-27: 500 mg via ORAL

## 2024-02-10 ENCOUNTER — Ambulatory Visit: Admitting: Urology

## 2024-02-10 DIAGNOSIS — R338 Other retention of urine: Secondary | ICD-10-CM | POA: Diagnosis not present

## 2024-02-10 DIAGNOSIS — N3000 Acute cystitis without hematuria: Secondary | ICD-10-CM

## 2024-02-10 DIAGNOSIS — N138 Other obstructive and reflux uropathy: Secondary | ICD-10-CM

## 2024-02-10 DIAGNOSIS — Z87440 Personal history of urinary (tract) infections: Secondary | ICD-10-CM

## 2024-02-10 DIAGNOSIS — N401 Enlarged prostate with lower urinary tract symptoms: Secondary | ICD-10-CM | POA: Diagnosis not present

## 2024-02-10 DIAGNOSIS — R339 Retention of urine, unspecified: Secondary | ICD-10-CM

## 2024-02-10 LAB — BLADDER SCAN AMB NON-IMAGING: Scan Result: 566

## 2024-02-10 MED ORDER — CIPROFLOXACIN HCL 500 MG PO TABS: 500.0000 mg | ORAL_TABLET | Freq: Once | ORAL | Status: DC

## 2024-02-10 NOTE — Progress Notes (Signed)
 Impression: - Recurrent urinary tract infections.  He seems to be emptying out well.  No urothelial lesions, no evidence of obstruction  -Microscopic hematuria with negative cystoscopy  - Incomplete bladder emptying, most likely the causative factor of his recurrent infections.  Large residual urine volume today.  Plan: - At this point, he needs better bladder emptying.  I gave him the choice of having CIC performed 3 times a day by nursing staff at [facility] or having an indwelling Foley.  At this point he would rather not have an indwelling Foley  - I would recommend that the nursing staff at [facility] perform In-N-Out catheterization with a prelubricated 16 French disposable catheter every 8 hours.  This is the preferred management of his bladder.  - If this cannot be done, he needs a 85 French Foley catheter placed long-term, changed every month  - I will bring him back in a month to check to see how he is doing.  History of Present Illness:  This 78 year old male comes in today for follow-up.  Previously, he has seen Lauraine Oz, NP.  He has BPH with lower urinary tract symptoms.  Additionally, he has recurrent urinary tract infections.  Most recently treated for a multidrug-resistant E. coli with Cipro .  He has had renal ultrasound performed in late May of this year revealing bilateral renal cysts, no hydronephrosis, debris layering dependently within the bladder.  He also has recurrent microscopic hematuria.  7.8.2025: [name] is here for cysto. At his visit 2 weeks ago he had a MDR e.coli--treated w/ monurol .  10.14.2025: Here today for check.  He had a catheter placed in September when he was admitted to the hospital with a seizure disorder.  At that time he grew Enterococcus and E. coli.  Catheter was changed last week.  He would rather not have the catheter.  10.28.2025: Here today for another check.  He is not getting physical therapy, spends most of his time  sitting or lying down.  Not able to walk by himself.  No urinary complaints.  Today is just a visit to check his urinary residual volume.  This was measured at 566 mL.   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   Nausea vomiting and diarrhea 09/14/2022   Restless leg syndrome     Past Surgical History:  Procedure Laterality Date   BACK SURGERY     Fusion 2005   BIOPSY  09/18/2022   Procedure: BIOPSY;  Surgeon: Shila Gustav GAILS, MD;  Location: [facility] ENDOSCOPY;  Service: Gastroenterology;;   COLONOSCOPY WITH PROPOFOL  N/A 11/29/2022   Procedure: COLONOSCOPY WITH PROPOFOL ;  Surgeon: Cindie Carlin POUR, DO;  Location: [facility] ENDO SUITE;  Service: Endoscopy;  Laterality: N/A;  145pm, asa 3, [place] [facility]   COLONOSCOPY WITH PROPOFOL  N/A 05/26/2023   Procedure: COLONOSCOPY WITH PROPOFOL ;  Surgeon: Cindie Carlin POUR, DO;  Location: [facility] ENDO SUITE;  Service: Endoscopy;  Laterality: N/A;  8:15 am, asa 3   ESOPHAGOGASTRODUODENOSCOPY (EGD) WITH PROPOFOL  N/A 09/18/2022   Procedure: ESOPHAGOGASTRODUODENOSCOPY (EGD) WITH PROPOFOL ;  Surgeon: Shila Gustav GAILS, MD;  Location: [facility] ENDOSCOPY;  Service: Gastroenterology;  Laterality: N/A;   Graves Disease     JOINT REPLACEMENT  1970   Right knee    LAMINECTOMY WITH POSTERIOR LATERAL ARTHRODESIS LEVEL 2 N/A 06/29/2021   Procedure: Laminectomy - Lumbar one-two, Lumbar two-three - redo with posterior lateral fusion with pedicle screws;  Surgeon: Louis Shove, MD;  Location: [facility] OR;  Service: Neurosurgery;  Laterality: N/A;   NECK SURGERY     POSTERIOR CERVICAL FUSION/FORAMINOTOMY  12/05/2011   Procedure: POSTERIOR CERVICAL FUSION/FORAMINOTOMY LEVEL 4;  Surgeon: Darina MALVA Boehringer, MD;  Location: [facility]  NEURO ORS;  Service: Neurosurgery;  Laterality: N/A;  Posterior Cervical fixation Cervical four-Seven,Cervical Decompresson Cervical three   THORACIC DISCECTOMY N/A 01/08/2021   Procedure: Thoracic One-Two, Thoracic Two-Three, Thoracic Eleven-Twelve, Thoracic Twelve-Lumbar One Laminectomy;  Surgeon: Louis Shove, MD;  Location: [facility] OR;  Service: Neurosurgery;  Laterality: N/A;    Home Medications:  Allergies as of 02/10/2024       Reactions   Sulfa Antibiotics Other (See Comments)   Causes blisters and skin to peel from inside mouth   Hydrocodone     Misc. Sulfonamide Containing Compounds    Oxycodone          Medication List        Accurate as of February 10, 2024  9:12 AM. If you have any questions, ask your nurse or doctor.          acetaminophen  325 MG tablet Commonly known as: TYLENOL  Take 650 mg by mouth 3 (three) times daily.   ARTIFICIAL TEARS OP Apply 2 drops to eye every 2 (two) hours as needed (dry eye).   aspirin  81 MG chewable tablet Chew 1 tablet (81 mg total) by mouth daily.   calcium  carbonate 500 MG chewable tablet Commonly known as: TUMS - dosed in mg elemental calcium  Chew 1 tablet by mouth 3 (three) times daily with meals.   cyanocobalamin  1000 MCG tablet Commonly known as: VITAMIN B12 Take 1,000 mcg by mouth daily.   DULoxetine  60 MG capsule Commonly known as: CYMBALTA  Take 60 mg by mouth daily.   ferrous sulfate  325 (65 FE) MG tablet Take 325 mg by mouth See admin instructions. Once daily only Every Mon, Wed, and Fri   levETIRAcetam  500 MG tablet Commonly known as: KEPPRA  Take 1 tablet (500 mg total) by mouth 2 (two) times daily.   levothyroxine  150 MCG tablet Commonly known as: SYNTHROID  TAKE 1 TABLET BY MOUTH DAILY BEFORE BREAKFAST   loperamide  2 MG tablet Commonly known as: IMODIUM  A-D Take 2 mg by mouth 4 (four) times daily as needed for diarrhea or loose stools.   LORazepam  0.5 MG tablet Commonly known as: ATIVAN  Take 0.5 mg by  mouth in the morning, at noon, and at bedtime.   Magnesium  Oxide 400 MG Caps Take 2 capsules (800 mg total) by mouth daily.   pantoprazole  40 MG tablet Commonly known as: PROTONIX  Take 1 tablet (40 mg total) by mouth 2 (two) times daily.   polyethylene glycol 17 g packet Commonly known as: MIRALAX  / GLYCOLAX  Take 17 g by mouth daily. Hold for 24 hours if watery stools, or more than 3 stools daily.   pregabalin  100 MG capsule Commonly known as: Lyrica  Take 1 capsule (100 mg total) by mouth 2 (two) times daily.   PRESERVISION AREDS 2 PO Take 1 capsule by mouth daily.   risperiDONE  2 MG tablet Commonly known as: RISPERDAL  Take 2 mg by mouth 2 (two) times daily.   senna 8.6 MG Tabs tablet Commonly known  as: SENOKOT Take 1 tablet (8.6 mg total) by mouth daily.   simvastatin  20 MG tablet Commonly known as: ZOCOR  TAKE 1 TABLET BY MOUTH AT BEDTIME   sucralfate 1 g tablet Commonly known as: CARAFATE Take 1 g by mouth 2 (two) times daily.   tamsulosin  0.4 MG Caps capsule Commonly known as: FLOMAX  Take 1 capsule (0.4 mg total) by mouth 2 (two) times daily.   triamcinolone  cream 0.1 % Commonly known as: KENALOG  Apply 1 Application topically every 12 (twelve) hours as needed (itching/redness).   trimethoprim  100 MG tablet Commonly known as: TRIMPEX  Take 1 tablet (100 mg total) by mouth at bedtime.   Vitamin D3 1.25 MG (50000 UT) Tabs Take 50,000 Units by mouth every Wednesday.        Allergies:  Allergies  Allergen Reactions   Sulfa Antibiotics Other (See Comments)    Causes blisters and skin to peel from inside mouth   Hydrocodone     Misc. Sulfonamide Containing Compounds    Oxycodone      Family History  Problem Relation Age of Onset   Heart attack Mother 50   Heart disease Father     Social History:  reports that he has been smoking cigarettes. He has a 12.5 pack-year smoking history. He has never used smokeless tobacco. He reports that he does not  currently use drugs. He reports that he does not drink alcohol.  ROS: A complete review of systems was performed.  All systems are negative except for pertinent findings as noted.     I have independently reviewed prior imaging--renal ultrasound done recently, CT scan from June 2024, renal ultrasound images from June  I have reviewed prior urine cultures  Prior office records reviewed

## 2024-02-10 NOTE — Progress Notes (Signed)
 Bladder Scan completed today due to reason BPH with urinary obstruction  Patient cannot void prior to the bladder scan. Bladder scan result: 566   Performed By: Carlos, CMA  Additional notes- Patient is scheduled to follow up with 4 week F/U Dr. Matilda   Additional notes/ Follow up:

## 2024-02-17 ENCOUNTER — Ambulatory Visit: Admitting: Neurology

## 2024-02-17 VITALS — BP 115/78 | HR 68 | Ht 79.0 in | Wt 238.0 lb

## 2024-02-17 DIAGNOSIS — R200 Anesthesia of skin: Secondary | ICD-10-CM | POA: Diagnosis not present

## 2024-02-17 DIAGNOSIS — G959 Disease of spinal cord, unspecified: Secondary | ICD-10-CM

## 2024-02-17 DIAGNOSIS — R569 Unspecified convulsions: Secondary | ICD-10-CM

## 2024-02-17 DIAGNOSIS — R531 Weakness: Secondary | ICD-10-CM

## 2024-02-17 DIAGNOSIS — R269 Unspecified abnormalities of gait and mobility: Secondary | ICD-10-CM
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
## 2024-03-08 NOTE — Progress Notes (Signed)
 Impression: - Recurrent urinary tract infections.  No evidence of obstruction.  No urothelial lesions, no evidence of obstruction  -Microscopic hematuria with negative cystoscopy  - Incomplete bladder emptying, most likely the causative factor of his recurrent infections.   My order for 3 times a day catheterization has not been followed up/obeyed by the nursing staff at [facility]. Plan: -Again, I would instruct the nursing staff at [facility] to perform In-N-Out catheterization 3 times a day with prelubricated 16 French catheters  - Urine culture was sent, no antibiotics started yet.  We will base this on his culture  - I will have him come back in 2 months for recheck  History of Present Illness:  This 78 year old male comes in today for follow-up.  Previously, he has seen Lauraine Oz, NP.  He has BPH with lower urinary tract symptoms.  Additionally, he has recurrent urinary tract infections.  Most recently treated for a multidrug-resistant E. coli with Cipro .  He has had renal ultrasound performed in late May of this year revealing bilateral renal cysts, no hydronephrosis, debris layering dependently within the bladder.  He also has recurrent microscopic hematuria.  7.8.2025: [name] is here for cysto. At his visit 2 weeks ago he had a MDR e.coli--treated w/ monurol .  10.14.2025: Here today for check.  He had a catheter placed in September when he was admitted to the hospital with a seizure disorder.  At that time he grew Enterococcus and E. coli.  Catheter was changed last week.  He would rather not have the catheter.  10.28.2025: Here today for another check.  He is not getting physical therapy, spends most of his time sitting or lying down.  Not able to walk by himself.  No urinary complaints.  Today is just a visit to check his urinary residual volume.  This was measured at 566 mL.  11.25.2025: Here today for recheck.  He is not able to give us  a specimen.  Despite  explicit instructions/order at his last visit for catheterization 3 times a day, he states that this has not been done.  Referring back to my last note, I saw that I highlighted/underlined an order for this to be done.  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   Nausea vomiting and diarrhea 09/14/2022   Restless leg syndrome     Past Surgical History:  Procedure Laterality Date   BACK SURGERY     Fusion 2005   BIOPSY  09/18/2022   Procedure: BIOPSY;  Surgeon: Shila Gustav GAILS, MD;  Location: [facility] ENDOSCOPY;  Service: Gastroenterology;;   COLONOSCOPY WITH PROPOFOL  N/A 11/29/2022   Procedure: COLONOSCOPY WITH PROPOFOL ;  Surgeon: Cindie Carlin POUR, DO;  Location: [facility] ENDO SUITE;  Service: Endoscopy;  Laterality: N/A;  145pm, asa 3, [facility] resident   COLONOSCOPY WITH PROPOFOL  N/A 05/26/2023   Procedure: COLONOSCOPY WITH PROPOFOL ;  Surgeon: Cindie Carlin POUR, DO;  Location: [facility] ENDO SUITE;  Service: Endoscopy;  Laterality: N/A;  8:15 am, asa 3   ESOPHAGOGASTRODUODENOSCOPY (EGD) WITH PROPOFOL  N/A 09/18/2022   Procedure: ESOPHAGOGASTRODUODENOSCOPY (EGD) WITH PROPOFOL ;  Surgeon: Shila Gustav GAILS, MD;  Location: [facility]  ENDOSCOPY;  Service: Gastroenterology;  Laterality: N/A;   Graves Disease     JOINT REPLACEMENT  1970   Right knee    LAMINECTOMY WITH POSTERIOR LATERAL ARTHRODESIS LEVEL 2 N/A 06/29/2021   Procedure: Laminectomy - Lumbar one-two, Lumbar two-three - redo with posterior lateral fusion with pedicle screws;  Surgeon: Louis Shove, MD;  Location: [facility] OR;  Service: Neurosurgery;  Laterality: N/A;   NECK SURGERY     POSTERIOR CERVICAL FUSION/FORAMINOTOMY  12/05/2011   Procedure: POSTERIOR CERVICAL FUSION/FORAMINOTOMY  LEVEL 4;  Surgeon: Darina MALVA Boehringer, MD;  Location: [facility] NEURO ORS;  Service: Neurosurgery;  Laterality: N/A;  Posterior Cervical fixation Cervical four-Seven,Cervical Decompresson Cervical three   THORACIC DISCECTOMY N/A 01/08/2021   Procedure: Thoracic One-Two, Thoracic Two-Three, Thoracic Eleven-Twelve, Thoracic Twelve-Lumbar One Laminectomy;  Surgeon: Louis Shove, MD;  Location: [facility] OR;  Service: Neurosurgery;  Laterality: N/A;    Home Medications:  Allergies as of 03/09/2024       Reactions   Sulfa Antibiotics Other (See Comments)   Causes blisters and skin to peel from inside mouth   Hydrocodone     Misc. Sulfonamide Containing Compounds    Oxycodone          Medication List        Accurate as of March 08, 2024 10:21 AM. If you have any questions, ask your nurse or doctor.          acetaminophen  325 MG tablet Commonly known as: TYLENOL  Take 650 mg by mouth 3 (three) times daily.   ARTIFICIAL TEARS OP Apply 2 drops to eye every 2 (two) hours as needed (dry eye).   aspirin  81 MG chewable tablet Chew 1 tablet (81 mg total) by mouth daily.   calcium  carbonate 500 MG chewable tablet Commonly known as: TUMS - dosed in mg elemental calcium  Chew 1 tablet by mouth 3 (three) times daily with meals.   cyanocobalamin  1000 MCG tablet Commonly known as: VITAMIN B12 Take 1,000 mcg by mouth daily.   DULoxetine  60 MG capsule Commonly known as: CYMBALTA  Take 60 mg by mouth daily.   ferrous sulfate  325 (65 FE) MG tablet Take 325 mg by mouth See admin instructions. Once daily only Every Mon, Wed, and Fri   fluticasone 50 MCG/ACT nasal spray Commonly known as: FLONASE Place 1 spray into both nostrils daily.   levETIRAcetam  500 MG tablet Commonly known as: KEPPRA  Take 1 tablet (500 mg total) by mouth 2 (two) times daily.   levothyroxine  150 MCG tablet Commonly known as: SYNTHROID  TAKE 1 TABLET BY MOUTH DAILY BEFORE BREAKFAST   loperamide  2 MG tablet Commonly known as:  IMODIUM  A-D Take 2 mg by mouth 4 (four) times daily as needed for diarrhea or loose stools.   LORazepam  0.5 MG tablet Commonly known as: ATIVAN  Take 0.5 mg by mouth in the morning, at noon, and at bedtime.   Magnesium  Oxide 400 MG Caps Take 2 capsules (800 mg total) by mouth daily.   METHENAMINE HIPPURATE PO Take 1 g by mouth every 12 (twelve) hours as needed.   pantoprazole  40 MG tablet Commonly known as: PROTONIX  Take 1 tablet (40 mg total) by mouth 2 (two) times daily.   polyethylene glycol 17 g packet Commonly known as: MIRALAX  / GLYCOLAX  Take 17 g by mouth daily. Hold for 24 hours if watery stools, or more than 3 stools daily.   pregabalin  100 MG capsule Commonly known as: Lyrica  Take 1 capsule (100 mg total) by mouth 2 (two) times daily.  PRESERVISION AREDS 2 PO Take 1 capsule by mouth daily.   risperiDONE  2 MG tablet Commonly known as: RISPERDAL  Take 2 mg by mouth 2 (two) times daily.   senna 8.6 MG Tabs tablet Commonly known as: SENOKOT Take 1 tablet (8.6 mg total) by mouth daily.   simvastatin  20 MG tablet Commonly known as: ZOCOR  TAKE 1 TABLET BY MOUTH AT BEDTIME   sucralfate 1 g tablet Commonly known as: CARAFATE Take 1 g by mouth 2 (two) times daily.   tamsulosin  0.4 MG Caps capsule Commonly known as: FLOMAX  Take 1 capsule (0.4 mg total) by mouth 2 (two) times daily.   triamcinolone  cream 0.1 % Commonly known as: KENALOG  Apply 1 Application topically every 12 (twelve) hours as needed (itching/redness).   trimethoprim  100 MG tablet Commonly known as: TRIMPEX  Take 1 tablet (100 mg total) by mouth at bedtime.   Vitamin D3 1.25 MG (50000 UT) Tabs Take 50,000 Units by mouth every Wednesday.        Allergies:  Allergies  Allergen Reactions   Sulfa Antibiotics Other (See Comments)    Causes blisters and skin to peel from inside mouth   Hydrocodone     Misc. Sulfonamide Containing Compounds    Oxycodone      Family History  Problem Relation  Age of Onset   Heart attack Mother 79   Heart disease Father     Social History:  reports that he has been smoking cigarettes. He has a 12.5 pack-year smoking history. He has never used smokeless tobacco. He reports that he does not currently use drugs. He reports that he does not drink alcohol.  ROS: A complete review of systems was performed.  All systems are negative except for pertinent findings as noted.     I have independently reviewed prior imaging--renal ultrasound done recently, CT scan from June 2024, renal ultrasound images from June  I have reviewed prior urine cultures  Prior office records reviewed  Urine today was infected  Bladder scan volume 360 mL

## 2024-03-09 ENCOUNTER — Other Ambulatory Visit: Payer: Self-pay

## 2024-03-09 ENCOUNTER — Ambulatory Visit (INDEPENDENT_AMBULATORY_CARE_PROVIDER_SITE_OTHER): Admitting: Urology

## 2024-03-09 VITALS — BP 111/77 | HR 65

## 2024-03-09 DIAGNOSIS — R3129 Other microscopic hematuria: Secondary | ICD-10-CM | POA: Diagnosis not present

## 2024-03-09 DIAGNOSIS — Z87440 Personal history of urinary (tract) infections: Secondary | ICD-10-CM | POA: Diagnosis not present

## 2024-03-09 DIAGNOSIS — N401 Enlarged prostate with lower urinary tract symptoms: Secondary | ICD-10-CM | POA: Diagnosis not present

## 2024-03-09 DIAGNOSIS — R339 Retention of urine, unspecified: Secondary | ICD-10-CM

## 2024-03-09 DIAGNOSIS — N138 Other obstructive and reflux uropathy: Secondary | ICD-10-CM

## 2024-03-09 DIAGNOSIS — N3000 Acute cystitis without hematuria: Secondary | ICD-10-CM

## 2024-03-09 LAB — MICROSCOPIC EXAMINATION: WBC, UA: >30 /HPF — AB (ref 0–5)

## 2024-03-09 LAB — URINALYSIS, ROUTINE W REFLEX MICROSCOPIC
Bilirubin, UA: NEGATIVE
Glucose, UA: NEGATIVE
Ketones, UA: NEGATIVE
Nitrite, UA: NEGATIVE
Specific Gravity, UA: 1.02 (ref 1.005–1.030)
Urobilinogen, Ur: 0.2 mg/dL (ref 0.2–1.0)
pH, UA: 6 (ref 5.0–7.5)

## 2024-03-09 LAB — BLADDER SCAN AMB NON-IMAGING: Scan Result: 369

## 2024-03-09 NOTE — Progress Notes (Signed)
 Order received to complete in and out catherization for urine sample. Patient was cleaned and prepped in a sterle fashion with Betadinex3  A 14 fr catheter foley was inserted. Urine return was note 34 ml. Urine Dark yellow in color. Catheter was then removed. Patient tolerated procedure with no complications were noted.  Performed by Exie LANES CMA  Urine sent for routine urinalysis

## 2024-03-09 NOTE — Progress Notes (Signed)
 Bladder Scan completed today due to reason of incomplete bladder emptying   Patient cannot void prior to the bladder scan. Bladder scan result: [id number]  Performed By: Exie DASEN. CMA  Additional notes- Patient is scheduled to follow up with MD

## 2024-03-13 LAB — URINE CULTURE

## 2024-03-15 ENCOUNTER — Ambulatory Visit: Payer: Self-pay

## 2024-03-15 DIAGNOSIS — R8279 Other abnormal findings on microbiological examination of urine: Secondary | ICD-10-CM

## 2024-03-15 MED ORDER — AMOXICILLIN-POT CLAVULANATE 875-125 MG PO TABS: 1.0000 | ORAL_TABLET | Freq: Two times a day (BID) | ORAL | 0 refills | Status: AC

## 2024-03-15 NOTE — Telephone Encounter (Signed)
-----   Message from Garnette HERO Dahlstedt sent at 03/14/2024  6:58 PM EST ----- Pt's culture positive. Please call SNF--he needs abx-- -augmentin 875/125--1 po q 12 hrs x 7 days ----- Message ----- From: Interface, [facility] Lab Results In Sent: 03/13/2024   3:35 PM EST To: Garnette Shack, MD

## 2024-03-15 NOTE — Telephone Encounter (Signed)
 Called [facility] to make them aware of pt abx spoke with April-LPN to make her aware she voiced her understanding and confirmed pharmacy.

## 2024-04-15 ENCOUNTER — Encounter: Payer: Self-pay | Admitting: Gastroenterology

## 2024-05-06 ENCOUNTER — Telehealth: Payer: Self-pay | Admitting: Urology

## 2024-05-06 NOTE — Telephone Encounter (Signed)
 Belinda from [facility] called and cxld appt due to weather. He will need an appointment with Dr D before he retires. Call [phone] [facility]

## 2024-05-07 NOTE — Telephone Encounter (Signed)
 Called and left message that we can get patient rescheduled for March 3rd at 2:00P advised her to call back if patient cannot make appointment

## 2024-05-11 ENCOUNTER — Telehealth: Payer: Self-pay | Admitting: Urology

## 2024-05-11 ENCOUNTER — Ambulatory Visit: Admitting: Urology

## 2024-05-11 NOTE — Telephone Encounter (Signed)
 Returned phone call to [facility] who stated they ust wanted inform us  of patient positive UA stating patient has not been started on anything due to pending ua culture Rosaline was advised to send over UA results via fax or email she voiced her understanding

## 2024-05-11 NOTE — Telephone Encounter (Signed)
 SABRA

## 2024-06-15 ENCOUNTER — Ambulatory Visit: Admitting: Urology

## 2024-06-22 ENCOUNTER — Ambulatory Visit: Admitting: Urology
